# Patient Record
Sex: Male | Born: 1940 | Race: White | Hispanic: No | Marital: Married | Smoking: Former smoker
Health system: Southern US, Community
[De-identification: ages and names within clinical notes are randomized; demographics above are authoritative.]

## PROBLEM LIST (undated history)

## (undated) DIAGNOSIS — R130 Aphagia: Secondary | ICD-10-CM

## (undated) DIAGNOSIS — I639 Cerebral infarction, unspecified: Secondary | ICD-10-CM

## (undated) DIAGNOSIS — N390 Urinary tract infection, site not specified: Secondary | ICD-10-CM

## (undated) DIAGNOSIS — E785 Hyperlipidemia, unspecified: Secondary | ICD-10-CM

## (undated) DIAGNOSIS — Z87442 Personal history of urinary calculi: Secondary | ICD-10-CM

## (undated) DIAGNOSIS — J189 Pneumonia, unspecified organism: Secondary | ICD-10-CM

## (undated) DIAGNOSIS — J42 Unspecified chronic bronchitis: Secondary | ICD-10-CM

## (undated) DIAGNOSIS — M1990 Unspecified osteoarthritis, unspecified site: Secondary | ICD-10-CM

## (undated) DIAGNOSIS — I10 Essential (primary) hypertension: Secondary | ICD-10-CM

## (undated) DIAGNOSIS — F32A Depression, unspecified: Secondary | ICD-10-CM

## (undated) DIAGNOSIS — F0390 Unspecified dementia without behavioral disturbance: Secondary | ICD-10-CM

## (undated) DIAGNOSIS — F329 Major depressive disorder, single episode, unspecified: Secondary | ICD-10-CM

## (undated) DIAGNOSIS — H409 Unspecified glaucoma: Secondary | ICD-10-CM

## (undated) DIAGNOSIS — H5440 Blindness, one eye, unspecified eye: Secondary | ICD-10-CM

## (undated) DIAGNOSIS — E119 Type 2 diabetes mellitus without complications: Secondary | ICD-10-CM

## (undated) DIAGNOSIS — K219 Gastro-esophageal reflux disease without esophagitis: Secondary | ICD-10-CM

## (undated) HISTORY — PX: COLONOSCOPY: SHX174

## (undated) HISTORY — PX: ABDOMINAL HERNIA REPAIR: SHX539

## (undated) HISTORY — PX: PATELLA FRACTURE SURGERY: SHX735

## (undated) HISTORY — PX: CATARACT EXTRACTION W/ INTRAOCULAR LENS  IMPLANT, BILATERAL: SHX1307

## (undated) HISTORY — PX: POSTERIOR CERVICAL FUSION/FORAMINOTOMY: SHX5038

## (undated) HISTORY — PX: ESOPHAGOGASTRODUODENOSCOPY (EGD) WITH ESOPHAGEAL DILATION: SHX5812

## (undated) HISTORY — PX: FRACTURE SURGERY: SHX138

## (undated) HISTORY — PX: HERNIA REPAIR: SHX51

---

## 1999-09-18 ENCOUNTER — Encounter: Payer: Self-pay | Admitting: Nephrology

## 1999-09-18 ENCOUNTER — Encounter: Admission: RE | Admit: 1999-09-18 | Discharge: 1999-09-18 | Payer: Self-pay | Admitting: Nephrology

## 2002-06-13 ENCOUNTER — Encounter: Admission: RE | Admit: 2002-06-13 | Discharge: 2002-09-11 | Payer: Self-pay | Admitting: Rheumatology

## 2004-10-30 ENCOUNTER — Emergency Department (HOSPITAL_COMMUNITY): Admission: EM | Admit: 2004-10-30 | Discharge: 2004-10-31 | Payer: Self-pay | Admitting: *Deleted

## 2004-12-19 ENCOUNTER — Encounter: Admission: RE | Admit: 2004-12-19 | Discharge: 2005-03-19 | Payer: Self-pay | Admitting: Rheumatology

## 2008-10-16 ENCOUNTER — Inpatient Hospital Stay (HOSPITAL_COMMUNITY): Admission: EM | Admit: 2008-10-16 | Discharge: 2008-10-16 | Payer: Self-pay | Admitting: Emergency Medicine

## 2008-10-16 ENCOUNTER — Ambulatory Visit: Payer: Self-pay | Admitting: Cardiology

## 2011-01-06 LAB — COMPREHENSIVE METABOLIC PANEL
Albumin: 3.5 g/dL (ref 3.5–5.2)
Alkaline Phosphatase: 53 U/L (ref 39–117)
BUN: 14 mg/dL (ref 6–23)
Calcium: 9 mg/dL (ref 8.4–10.5)
Potassium: 3.8 mEq/L (ref 3.5–5.1)
Sodium: 142 mEq/L (ref 135–145)
Total Protein: 6.2 g/dL (ref 6.0–8.3)

## 2011-01-06 LAB — POCT CARDIAC MARKERS: Troponin i, poc: 0.17 ng/mL — ABNORMAL HIGH (ref 0.00–0.09)

## 2011-01-06 LAB — CBC
HCT: 44.1 % (ref 39.0–52.0)
Hemoglobin: 14.3 g/dL (ref 13.0–17.0)
MCV: 88.7 fL (ref 78.0–100.0)
Platelets: 206 10*3/uL (ref 150–400)
RBC: 4.82 MIL/uL (ref 4.22–5.81)
RBC: 4.97 MIL/uL (ref 4.22–5.81)
WBC: 5.2 10*3/uL (ref 4.0–10.5)
WBC: 6 10*3/uL (ref 4.0–10.5)

## 2011-01-06 LAB — LIPID PANEL
HDL: 34 mg/dL — ABNORMAL LOW (ref >39)
LDL Cholesterol: 65 mg/dL (ref 0–99)
Total CHOL/HDL Ratio: 3.4 RATIO
VLDL: 17 mg/dL (ref 0–40)

## 2011-01-06 LAB — GLUCOSE, CAPILLARY
Glucose-Capillary: 159 mg/dL — ABNORMAL HIGH (ref 70–99)
Glucose-Capillary: 218 mg/dL — ABNORMAL HIGH (ref 70–99)

## 2011-01-06 LAB — DIFFERENTIAL
Eosinophils Relative: 7 % — ABNORMAL HIGH (ref 0–5)
Lymphocytes Relative: 29 % (ref 12–46)
Lymphs Abs: 1.8 10*3/uL (ref 0.7–4.0)
Monocytes Absolute: 0.6 10*3/uL (ref 0.1–1.0)

## 2011-01-06 LAB — CK TOTAL AND CKMB (NOT AT ARMC)
CK, MB: 7.7 ng/mL — ABNORMAL HIGH (ref 0.3–4.0)
Relative Index: 4.3 — ABNORMAL HIGH (ref 0.0–2.5)
Total CK: 180 U/L (ref 7–232)

## 2011-01-06 LAB — POCT I-STAT, CHEM 8
BUN: 21 mg/dL (ref 6–23)
Calcium, Ion: 1.13 mmol/L (ref 1.12–1.32)
Chloride: 101 mEq/L (ref 96–112)
Creatinine, Ser: 0.9 mg/dL (ref 0.4–1.5)
Glucose, Bld: 185 mg/dL — ABNORMAL HIGH (ref 70–99)

## 2011-01-06 LAB — HEPARIN LEVEL (UNFRACTIONATED): Heparin Unfractionated: 0.37 IU/mL (ref 0.30–0.70)

## 2011-01-06 LAB — PROTIME-INR
INR: 0.9 (ref 0.00–1.49)
Prothrombin Time: 11.9 seconds (ref 11.6–15.2)

## 2011-01-06 LAB — CARDIAC PANEL(CRET KIN+CKTOT+MB+TROPI)
CK, MB: 4.9 ng/mL — ABNORMAL HIGH (ref 0.3–4.0)
Relative Index: 4.4 — ABNORMAL HIGH (ref 0.0–2.5)
Total CK: 117 U/L (ref 7–232)
Troponin I: <0.01 ng/mL (ref 0.00–0.06)

## 2011-01-06 LAB — D-DIMER, QUANTITATIVE: D-Dimer, Quant: 0.26 ug/mL-FEU (ref 0.00–0.48)

## 2011-01-06 LAB — TROPONIN I: Troponin I: <0.01 ng/mL (ref 0.00–0.06)

## 2011-02-04 NOTE — Consult Note (Signed)
Daniel Morrow, Daniel Morrow NO.:  [id number]   MEDICAL RECORD NO.:  [id number]          PATIENT TYPE:  INP   LOCATION:  2011                         FACILITY:  [facility]   PHYSICIAN:  Corky Crafts, MDDATE OF BIRTH:  02-27-1941   DATE OF CONSULTATION:  10/16/2008  DATE OF DISCHARGE:  10/16/2008                                 CONSULTATION   REFERRING PHYSICIAN:  Demetria Pore. Levitin, MD   REASON FOR CONSULTATION:  Shortness of breath, hypertension, high  cholesterol, and diabetes.   HISTORY OF PRESENT ILLNESS:  The patient is a 70 year old man who was  feeling well up until yesterday.  He is usually quite active.  He walks  3 times a week.  He actually had to hold a horse yesterday and this was  somewhat stressful.  He sat down on the incliner yesterday evening and  started to feel flushed.  His face appeared red according to his wife.  He got very short of breath and felt like he was being smothered.  The  sensation lasted about 30-45 minutes and resolved spontaneously.  He did  not have any chest pain.  He denies any palpitations or lightheadedness.  Since that time, he has not had any further repeat of symptoms.  He was  admitted to hospital and started on nitroglycerin and heparin.  He had  one point-of-care troponin which was slightly abnormal, but another  point-of-care which was normal, his troponins have all been in the  normal range.   PAST MEDICAL HISTORY:  1. Hypertension.  2. Diabetes.  3. High cholesterol.   PAST SURGICAL HISTORY:  Diskectomy, hernia, left knee surgery, and right  ankle surgery.   ALLERGIES:  No known drug allergies.   MEDICATIONS:  1. Lipitor 40 mg daily.  2. Zetia 10 mg daily.  3. Aspirin 81 mg daily.  4. Metformin 1 g b.i.d.  5. Glimepiride 2 mg b.i.d.  6. Hydrochlorothiazide 25 mg a day.  7. Benazepril 10 mg a day.   SOCIAL HISTORY:  He does not smoke, he quit 30 years ago.  He does not  drink alcohol.  He does drink  coffee.  He works as an Advertising account planner.   FAMILY HISTORY:  He had an uncle who had an MI.   REVIEW OF SYSTEMS:  He has had cold recently and cough.  He has had  seasonal allergies.  No nausea or vomiting.  No syncope.  Other symptoms  as described above.  All other systems are negative.   PHYSICAL EXAMINATION:  VITAL SIGNS:  Blood pressure 114/80 and pulse 70.  GENERAL:  He is awake, alert, in no distress.  HEAD:  Normocephalic and atraumatic.  EYES:  Extraocular movements intact.  NECK:  No JVD, no carotid bruits.  CARDIOVASCULAR:  Regular rate and rhythm.  S1 and S2.  LUNGS:  Clear to auscultation bilaterally.  ABDOMEN:  Soft, nontender, nondistended.  EXTREMITIES:  No edema.  Radial pulses 2+ radial pulses bilaterally.  NEUROLOGIC:  No focal deficits.  SKIN:  No rash.  BACK:  No kyphosis.   LABORATORY DATA:  Creatinine  0.8.  Troponins other than one point-of-  care have all been negative.  Hemoglobin 13.9.  Chest x-ray shows no  active disease.  ECG shows normal sinus rhythm.  No significant acute ST-  T wave changes diagnostic of ischemia.  There were possible septal Q-  waves.   ASSESSMENT AND PLAN:  1. Cardiac.  Unclear what caused the symptoms if his third set of      troponin is negative.  I think it will be okay to discharge the      patient and schedule an outpatient stress test later this week.      Certainly, if he had more symptoms or had an abnormal troponin on      his next test, I would likely plan for heart catheterization.  2. Increased cholesterol.  His LDL is less than 70, currently by the      blood test done in the hospital.  His HDL is somewhat low at less      than 40.  Continue Lipitor and Zetia.  3. Hypertension, uncontrolled.  4. Diabetes.  Continue aggressive control.      Corky Crafts, MD  Electronically Signed     JSV/MEDQ  D:  10/16/2008  T:  10/17/2008  Job:  [id number]

## 2011-02-04 NOTE — H&P (Signed)
Daniel Morrow, Daniel Morrow              ACCOUNT NO.:  [id number]   MEDICAL RECORD NO.:  [id number]          PATIENT TYPE:  INP   LOCATION:  2011                         FACILITY:  [facility]   PHYSICIAN:  Darryl D. Prime, MD    DATE OF BIRTH:  May 05, 1941   DATE OF ADMISSION:  10/15/2008  DATE OF DISCHARGE:                              HISTORY & PHYSICAL   PRIMARY CARE PHYSICIAN:  Lennox Pippins, M.D.   CODE STATUS:  Full code.   CHIEF COMPLAINT:  Smothering.   HISTORY OF PRESENT ILLNESS:  Daniel Morrow is a 70 year old male with a  history of diabetes for at least 10 years, history of hypertension,  history of hyperlipidemia, who notes new onset shortness of breath, and  severe smothering sensation at around 9:00 p.m. at rest while he was  getting ready to watch the football game. This lasted about 30 minutes.  It was not associated with exertion. He had no chest pain per say. No  nausea and vomiting. He did have sweats with the symptoms and flushing  noted by the family. No fever or cough noted. The patient has had a  recent sinus cold for the last 2 weeks and has been taking over-the-  counter decongestants for it. In the emergency room, troponin's were  mildly positive. Follow up was negative. He was started on heparin bolus  and then a drip as the symptoms were concerning and he did have T-wave  inversions in V1 and V2, were unsure if these changes were new. He was  also started on nitroglycerin drip and aspirin was given.   PAST MEDICAL/SURGICAL HISTORY:  1. History of diabetes for at least 10 years.  2. Former tobacco abuse.  3. History of hypertension.  4. History of hyperlipidemia.  5. He has had esophageal dilatation possibly for stricture on 3      occasions.  6. History of hernia repair x2.  7. He has had knee surgery on the right.  8. Neck surgery.   ALLERGIES:  NO KNOWN DRUG ALLERGIES.   MEDICATIONS:  1. Aspirin 81 mg daily.  2. Benazepril 10 mg daily.  3. HCTZ 25 mg  daily.  4. Glimepiride 2 mg in the morning and 4 mg in the p.m.  5. Lipitor 40 mg daily.  6. Metoprolol 40 mg daily.  7. Zetia 10 mg daily.   SOCIAL HISTORY:  He discontinued tobacco products 30 years ago. He  smoked for 20 years prior to that, 1 pack per day. No alcohol. He works  for [employer].   FAMILY HISTORY:  Negative for premature coronary artery disease. Father  had diabetes.   REVIEW OF SYSTEMS:  A 14 point review of systems negative unless stated  above.   PHYSICAL EXAMINATION:  VITAL SIGNS:  Temperature 98.8, blood pressure  152/80, pulse 85, respiratory rate 14, saturations 96% on room air.  GENERAL:  The patient is a male that looks younger than his stated age,  sitting upright in bed. In no acute distress.  HEENT:  Normocephalic and atraumatic. Pupils are equal, round, and  reactive to light.  Extraocular muscles intact. Oropharynx is dry.  NECK:  Supple with no lymphadenopathy or thyromegaly. No carotid bruits.  No jugular venous distention.  LUNGS:  Clear to auscultation bilaterally.  CARDIOVASCULAR:  Regular rhythm and rate. No murmur, rub, or gallop.  Normal S1 and S2. No S3 or S4.  ABDOMEN:  Soft, nontender, and nondistended with no hepatosplenomegaly.  EXTREMITIES:  No clubbing, cyanosis, or edema.  NEUROLOGIC:  Alert and oriented times four. Cranial nerves 2-12 are  grossly intact. Strength and sensation are grossly intact.   LABORATORY DATA:  White count of 6 with hemoglobin of 14.3 and  hematocrit 44.1. Platelets 218,000. segs 54%. Sodium is 139 with  potassium of 3.8. Chloride 101, bicarb 27, BUN 21, creatinine 0.9,  glucose 185. Cardiac markers point of care is around 2300 and showed a  troponin of 0.17, MB of 3.5. Blood draw shows the cardiac markers at  that time with MB of 7.7, CK 180, index was 4.2%, and troponin less than  0.01.   DIAGNOSTIC STUDIES:  Chest x-ray showed left sided atelectasis,  otherwise negative.   EKG showed  sinus rhythm with rate of 72 beats per minute, PR interval  168, QRS 100, QT corrected at 455, and T-wave inversions V1 and V2. He  has slightly poor anterior forces.   ASSESSMENT/PLAN:  This is a patient with a history of multiple risk  factors for coronary artery disease who now presents with possible  angina. Other possibilities include pulmonary embolus, DVT syndrome.  He  will be admitted for unstable angina. Will be given aspirin and beta  blocker and hold his oral hypoglycemics. He may need an ischemic  evaluation including cardiac catheterization. Will hold his Enalapril as  well for this reason but he will need to be discharged on this. Will  increase his Statins and Lipitor 80 mg daily. He will be on telemetry  and will get serial EKG's as needed. He will get cardiac markers. Check  a magnesium, TSH, and __________. It is suggested that he have an  evaluation of his ejection fraction. He will be NPO. Will get a  cardiology consult in the morning. For his diabetes, sliding scale  insulin for now.  Will rule out PE/DVT syndrome, will check a D-dimer.      Darryl D. Prime, MD  Electronically Signed     DDP/MEDQ  D:  10/16/2008  T:  10/16/2008  Job:  [id number]   cc:   Demetria Pore. Coral Spikes, M.D.

## 2011-06-02 ENCOUNTER — Emergency Department (HOSPITAL_COMMUNITY)
Admission: EM | Admit: 2011-06-02 | Discharge: 2011-06-02 | Disposition: A | Discharge status: Home / Self Care | Payer: Medicare Other | Attending: Emergency Medicine | Admitting: Emergency Medicine

## 2011-06-02 ENCOUNTER — Emergency Department (HOSPITAL_COMMUNITY): Payer: Medicare Other

## 2011-06-02 DIAGNOSIS — E119 Type 2 diabetes mellitus without complications: Secondary | ICD-10-CM | POA: Insufficient documentation

## 2011-06-02 DIAGNOSIS — Z79899 Other long term (current) drug therapy: Secondary | ICD-10-CM | POA: Insufficient documentation

## 2011-06-02 DIAGNOSIS — IMO0001 Reserved for inherently not codable concepts without codable children: Secondary | ICD-10-CM | POA: Insufficient documentation

## 2011-06-02 DIAGNOSIS — E785 Hyperlipidemia, unspecified: Secondary | ICD-10-CM | POA: Insufficient documentation

## 2011-06-02 DIAGNOSIS — R51 Headache: Secondary | ICD-10-CM | POA: Insufficient documentation

## 2011-06-02 DIAGNOSIS — N390 Urinary tract infection, site not specified: Secondary | ICD-10-CM | POA: Insufficient documentation

## 2011-06-02 DIAGNOSIS — R509 Fever, unspecified: Secondary | ICD-10-CM | POA: Insufficient documentation

## 2011-06-02 DIAGNOSIS — R1110 Vomiting, unspecified: Secondary | ICD-10-CM | POA: Insufficient documentation

## 2011-06-02 DIAGNOSIS — I10 Essential (primary) hypertension: Secondary | ICD-10-CM | POA: Insufficient documentation

## 2011-06-02 LAB — URINALYSIS, ROUTINE W REFLEX MICROSCOPIC
Glucose, UA: NEGATIVE mg/dL
Protein, ur: 100 mg/dL — AB
Specific Gravity, Urine: 1.023 (ref 1.005–1.030)
Urobilinogen, UA: 1 mg/dL (ref 0.0–1.0)

## 2011-06-02 LAB — CBC
MCH: 27.3 pg (ref 26.0–34.0)
MCHC: 33.7 g/dL (ref 30.0–36.0)
RDW: 14.1 % (ref 11.5–15.5)

## 2011-06-02 LAB — BASIC METABOLIC PANEL
Calcium: 8.9 mg/dL (ref 8.4–10.5)
GFR calc Af Amer: >60 mL/min (ref >60)
GFR calc non Af Amer: >60 mL/min (ref >60)
Glucose, Bld: 236 mg/dL — ABNORMAL HIGH (ref 70–99)
Potassium: 3.6 mEq/L (ref 3.5–5.1)
Sodium: 133 mEq/L — ABNORMAL LOW (ref 135–145)

## 2011-06-02 LAB — DIFFERENTIAL
Basophils Absolute: 0 10*3/uL (ref 0.0–0.1)
Basophils Relative: 0 % (ref 0–1)
Eosinophils Absolute: 0 10*3/uL (ref 0.0–0.7)
Eosinophils Relative: 0 % (ref 0–5)
Monocytes Absolute: 1.3 10*3/uL — ABNORMAL HIGH (ref 0.1–1.0)
Monocytes Relative: 12 % (ref 3–12)
Neutro Abs: 8.8 10*3/uL — ABNORMAL HIGH (ref 1.7–7.7)

## 2011-06-02 LAB — HEPATIC FUNCTION PANEL
AST: 13 U/L (ref 0–37)
Albumin: 3.3 g/dL — ABNORMAL LOW (ref 3.5–5.2)
Bilirubin, Direct: 0.1 mg/dL (ref 0.0–0.3)
Total Bilirubin: 0.7 mg/dL (ref 0.3–1.2)

## 2011-06-02 LAB — URINE MICROSCOPIC-ADD ON

## 2011-06-04 LAB — URINE CULTURE
Colony Count: >=100000
Culture  Setup Time: 201209100352

## 2011-06-30 ENCOUNTER — Other Ambulatory Visit: Payer: Self-pay | Admitting: Family Medicine

## 2011-06-30 ENCOUNTER — Ambulatory Visit
Admission: RE | Admit: 2011-06-30 | Discharge: 2011-06-30 | Disposition: A | Discharge status: Home / Self Care | Payer: Medicare Other | Source: Ambulatory Visit | Attending: Family Medicine | Admitting: Family Medicine

## 2011-06-30 DIAGNOSIS — N50811 Right testicular pain: Secondary | ICD-10-CM

## 2011-11-21 DIAGNOSIS — I639 Cerebral infarction, unspecified: Secondary | ICD-10-CM

## 2011-11-21 HISTORY — DX: Cerebral infarction, unspecified: I63.9

## 2012-04-02 ENCOUNTER — Observation Stay (HOSPITAL_COMMUNITY)
Admission: EM | Admit: 2012-04-02 | Discharge: 2012-04-05 | Disposition: A | Discharge status: Home / Self Care | Payer: Medicare Other | Attending: Internal Medicine | Admitting: Internal Medicine

## 2012-04-02 ENCOUNTER — Encounter (HOSPITAL_COMMUNITY): Payer: Self-pay | Admitting: Emergency Medicine

## 2012-04-02 DIAGNOSIS — G459 Transient cerebral ischemic attack, unspecified: Secondary | ICD-10-CM

## 2012-04-02 DIAGNOSIS — I10 Essential (primary) hypertension: Secondary | ICD-10-CM | POA: Insufficient documentation

## 2012-04-02 DIAGNOSIS — R4789 Other speech disturbances: Secondary | ICD-10-CM | POA: Insufficient documentation

## 2012-04-02 DIAGNOSIS — I6350 Cerebral infarction due to unspecified occlusion or stenosis of unspecified cerebral artery: Principal | ICD-10-CM | POA: Insufficient documentation

## 2012-04-02 DIAGNOSIS — R279 Unspecified lack of coordination: Secondary | ICD-10-CM | POA: Insufficient documentation

## 2012-04-02 DIAGNOSIS — E785 Hyperlipidemia, unspecified: Secondary | ICD-10-CM | POA: Diagnosis present

## 2012-04-02 DIAGNOSIS — R42 Dizziness and giddiness: Secondary | ICD-10-CM | POA: Insufficient documentation

## 2012-04-02 DIAGNOSIS — I63539 Cerebral infarction due to unspecified occlusion or stenosis of unspecified posterior cerebral artery: Secondary | ICD-10-CM | POA: Diagnosis present

## 2012-04-02 DIAGNOSIS — R262 Difficulty in walking, not elsewhere classified: Secondary | ICD-10-CM | POA: Insufficient documentation

## 2012-04-02 DIAGNOSIS — E119 Type 2 diabetes mellitus without complications: Secondary | ICD-10-CM | POA: Diagnosis present

## 2012-04-02 HISTORY — DX: Hyperlipidemia, unspecified: E78.5

## 2012-04-02 HISTORY — DX: Essential (primary) hypertension: I10

## 2012-04-02 LAB — CBC
HCT: 42.8 % (ref 39.0–52.0)
Hemoglobin: 14 g/dL (ref 13.0–17.0)
MCHC: 32.7 g/dL (ref 30.0–36.0)

## 2012-04-02 LAB — DIFFERENTIAL
Basophils Relative: 0 % (ref 0–1)
Monocytes Absolute: 0.7 10*3/uL (ref 0.1–1.0)
Monocytes Relative: 6 % (ref 3–12)
Neutro Abs: 9.1 10*3/uL — ABNORMAL HIGH (ref 1.7–7.7)

## 2012-04-02 LAB — APTT: aPTT: 27 seconds (ref 24–37)

## 2012-04-02 NOTE — ED Provider Notes (Signed)
History     CSN: [id number]  Arrival date & time 04/02/12  2156   First MD Initiated Contact with Patient 04/02/12 2304      Chief Complaint  Patient presents with  . Transient Ischemic Attack    (Consider location/radiation/quality/duration/timing/severity/associated sxs/prior treatment) HPI Comments: 71 year old male with history of diabetes, hypertension and hypercholesterolemia who presents by paramedics with a complaint of slurred speech, weakness. According to the family members who accompany the patient he had acute onset of difficulty walking, slurred speech that occurred approximately 3 hours prior to evaluation. He was found to be severely hypertensive at 220 systolic, diaphoretic, nauseated. The symptoms lasted approximately 30-45 minutes, the patient had spontaneous resolution of his symptoms in route with paramedics. On arrival the patient has no complaints or symptoms. They were acute in onset, persistent, resolve spontaneously and have not been associated with fevers, chills, diarrhea, dysuria, rashes, chest pain, palpitations, neck pain, headache or changes in vision. He has never had any symptoms like this in the past  The history is provided by the patient, the spouse, the EMS personnel and a relative.    Past Medical History  Diagnosis Date  . Hypertension   . Diabetes mellitus   . Hyperlipidemia     History reviewed. No pertinent past surgical history.  History reviewed. No pertinent family history.  History  Substance Use Topics  . Smoking status: Never Smoker   . Smokeless tobacco: Not on file  . Alcohol Use: No      Review of Systems  All other systems reviewed and are negative.    Allergies  Review of patient's allergies indicates no known allergies.  Home Medications   Current Outpatient Rx  Name Route Sig Dispense Refill  . ASPIRIN 81 MG PO TABS Oral Take 81 mg by mouth daily.    Marland Kitchen BENAZEPRIL HCL 10 MG PO TABS Oral Take 10 mg by mouth  daily.    Marland Kitchen CANAGLIFLOZIN 100 MG PO TABS Oral Take 1 tablet by mouth daily.    Marland Kitchen HYDROCHLOROTHIAZIDE 25 MG PO TABS Oral Take 25 mg by mouth daily.    . CENTRUM SILVER PO Oral Take 1 tablet by mouth daily.    Marland Kitchen PANTOPRAZOLE SODIUM 40 MG PO TBEC Oral Take 40 mg by mouth daily.    Marland Kitchen SIMVASTATIN 40 MG PO TABS Oral Take 40 mg by mouth every evening.    Marland Kitchen SITAGLIPTIN-METFORMIN HCL 50-1000 MG PO TABS Oral Take 1 tablet by mouth 2 (two) times daily with a meal.      BP 145/69  Pulse 76  Temp 98.1 F (36.7 C)  Resp 23  SpO2 96%  Physical Exam  Nursing note and vitals reviewed. Constitutional: He appears well-developed and well-nourished. No distress.  HENT:  Head: Normocephalic and atraumatic.  Mouth/Throat: Oropharynx is clear and moist. No oropharyngeal exudate.  Eyes: Conjunctivae and EOM are normal. Pupils are equal, round, and reactive to light. Right eye exhibits no discharge. Left eye exhibits no discharge. No scleral icterus.  Neck: Normal range of motion. Neck supple. No JVD present. No thyromegaly present.  Cardiovascular: Normal rate, regular rhythm, normal heart sounds and intact distal pulses.  Exam reveals no gallop and no friction rub.   No murmur heard. Pulmonary/Chest: Effort normal and breath sounds normal. No respiratory distress. He has no wheezes. He has no rales.  Abdominal: Soft. Bowel sounds are normal. He exhibits no distension and no mass. There is no tenderness.  Musculoskeletal: Normal range of motion.  He exhibits no edema and no tenderness.  Lymphadenopathy:    He has no cervical adenopathy.  Neurological: He is alert. Coordination normal.       Neurologic exam:  Speech clear, pupils equal round reactive to light, extraocular movements intact  Normal peripheral visual fields Cranial nerves III through XII normal including no facial droop Follows commands, moves all extremities x4, normal strength to bilateral upper and lower extremities at all major muscle  groups including grip Sensation normal to light touch and pinprick Coordination intact, no limb ataxia, finger-nose-finger normal Rapid alternating movements normal No pronator drift Gait normal   Skin: Skin is warm and dry. No rash noted. No erythema.  Psychiatric: He has a normal mood and affect. His behavior is normal.    ED Course  Procedures (including critical care time)  Labs Reviewed  CBC - Abnormal; Notable for the following:    WBC 11.0 (*)     All other components within normal limits  DIFFERENTIAL - Abnormal; Notable for the following:    Neutrophils Relative 83 (*)     Neutro Abs 9.1 (*)     Lymphocytes Relative 10 (*)     All other components within normal limits  COMPREHENSIVE METABOLIC PANEL - Abnormal; Notable for the following:    Glucose, Bld 218 (*)     BUN 25 (*)     GFR calc non Af Amer 66 (*)     GFR calc Af Amer 76 (*)     All other components within normal limits  CK TOTAL AND CKMB - Abnormal; Notable for the following:    CK, MB 4.2 (*)     Relative Index 3.3 (*)     All other components within normal limits  PROTIME-INR  APTT  TROPONIN I   Ct Head Wo Contrast  04/03/2012  *RADIOLOGY REPORT*  Clinical Data: Slurred speech, dizziness.  CT HEAD WITHOUT CONTRAST  Technique:  Contiguous axial images were obtained from the base of the skull through the vertex without contrast.  Comparison: None.  Findings: Prominence of the sulci, cisterns, and ventricles, in keeping with volume loss. There are subcortical and periventricular white matter hypodensities, a nonspecific finding most often seen with chronic microangiopathic changes.  There is no evidence for acute hemorrhage, overt hydrocephalus, mass lesion, or abnormal extra-axial fluid collection.  No definite CT evidence for acute cortical based (large artery) infarction. The visualized paranasal sinuses and mastoid air cells are predominately clear.  IMPRESSION: Mild white matter changes and volume loss.   No definite acute intracranial abnormality.  Original Report Authenticated By: Waneta Martins, M.D.     1. TIA (transient ischemic attack)   2. Hypertension       MDM  At this time his ABCD2 score for TIA is 6 putting him in a high risk category. We'll perform CT scan, EKG, laboratory workup though at this time the patient has no carotid bruits, normal neurologic exam and blood pressure which has improved significantly and is currently in the 160/85 range. CT scan pending at this time  ED ECG REPORT  I personally interpreted this EKG   Date: 04/03/2012   Rate: 72  Rhythm: normal sinus rhythm  QRS Axis: normal  Intervals: normal  ST/T Wave abnormalities: normal  Conduction Disutrbances:none  Narrative Interpretation: Poor R-wave progression  Old EKG Reviewed: Compared with 10/15/2008, no significant changes, QRS remains slightly prolonged, poor R-wave progression still present   Results show no significant abnormalities including troponin, blood counts,  electrolytes  cardiac enzymes. CT scan reviewed showing no signs of acute ischemia or hemorrhage, patient given results and is in agreement with admission to the hospital as his risk for seven-day stroke is almost 12% based on ABCD2 scores   Vida Roller, MD 04/03/12 [phone]

## 2012-04-02 NOTE — ED Notes (Signed)
Patient was carrying his granddaughter in; when patient got inside the building, patient started to become really dizzy.  Handed his granddaughter to his family member and was assisted to the couch.  Patient became really hot, diaphoretic, and nauseated.  Patient vomited x1; RN at nursing home (that he was visiting at), noticed that patient's eyes were very dilated.  Patient began to have slurred speech and possible expressive aphasia.  Last seen normal was 2045.  EMS was paged; upon EMS arrival, patient's symptoms resolved.  Denies weakness at this time.  Facial droop not present; speech clear.  Hang grips and foot pushes are bilaterally equal and strong.  NIH scale negative.  Patient alert and oriented x4; PERRL present.  Upon arrival to room, patient changed into gown and connected to continuous cardiac, pulse ox, and blood pressure monitor.

## 2012-04-02 NOTE — ED Notes (Signed)
Patient currently sitting up in bed; no respiratory or acute distress noted.  Patient updated on plan of care; informed patient that we are currently waiting on EDP to come and assess patient; patient has no other questions or concerns at this time; will continue to monitor.

## 2012-04-02 NOTE — ED Notes (Signed)
Per EMS, patient's wife report that patient felt dizzy around 8:30 pm, started to have slurred speech, started to have weakness (cannot specify whether one sided or both), and patient fell against the fall -- did not have a syncopal episode; denies hitting head or any injury.  Denies any deficits at this time; symmetrical smile.  No nausea/vomiting enroute; slurred speech resolved upon EMS arrival.  No history of TIA/CVA.  18 GA left AC IV started.  12-lead was unremarkable; stroke scale negative.

## 2012-04-03 ENCOUNTER — Encounter (HOSPITAL_COMMUNITY): Payer: Self-pay | Admitting: Internal Medicine

## 2012-04-03 ENCOUNTER — Observation Stay (HOSPITAL_COMMUNITY): Payer: Medicare Other

## 2012-04-03 ENCOUNTER — Emergency Department (HOSPITAL_COMMUNITY): Payer: Medicare Other

## 2012-04-03 DIAGNOSIS — G459 Transient cerebral ischemic attack, unspecified: Secondary | ICD-10-CM

## 2012-04-03 DIAGNOSIS — I10 Essential (primary) hypertension: Secondary | ICD-10-CM | POA: Diagnosis present

## 2012-04-03 DIAGNOSIS — E119 Type 2 diabetes mellitus without complications: Secondary | ICD-10-CM | POA: Diagnosis present

## 2012-04-03 DIAGNOSIS — I63539 Cerebral infarction due to unspecified occlusion or stenosis of unspecified posterior cerebral artery: Secondary | ICD-10-CM | POA: Diagnosis present

## 2012-04-03 LAB — COMPREHENSIVE METABOLIC PANEL
Albumin: 4.1 g/dL (ref 3.5–5.2)
BUN: 25 mg/dL — ABNORMAL HIGH (ref 6–23)
CO2: 29 mEq/L (ref 19–32)
Chloride: 101 mEq/L (ref 96–112)
Creatinine, Ser: 1.11 mg/dL (ref 0.50–1.35)
GFR calc Af Amer: 76 mL/min — ABNORMAL LOW (ref >90)
GFR calc non Af Amer: 66 mL/min — ABNORMAL LOW (ref >90)
Total Bilirubin: 0.8 mg/dL (ref 0.3–1.2)

## 2012-04-03 LAB — CBC
HCT: 40.5 % (ref 39.0–52.0)
Hemoglobin: 13.3 g/dL (ref 13.0–17.0)
MCV: 81.7 fL (ref 78.0–100.0)
RBC: 4.96 MIL/uL (ref 4.22–5.81)
WBC: 7.2 10*3/uL (ref 4.0–10.5)

## 2012-04-03 LAB — BASIC METABOLIC PANEL
CO2: 28 mEq/L (ref 19–32)
Chloride: 103 mEq/L (ref 96–112)
Potassium: 3.6 mEq/L (ref 3.5–5.1)
Sodium: 141 mEq/L (ref 135–145)

## 2012-04-03 LAB — CK TOTAL AND CKMB (NOT AT ARMC)
CK, MB: 4.2 ng/mL — ABNORMAL HIGH (ref 0.3–4.0)
Total CK: 126 U/L (ref 7–232)

## 2012-04-03 LAB — RAPID URINE DRUG SCREEN, HOSP PERFORMED
Amphetamines: NOT DETECTED
Opiates: NOT DETECTED
Tetrahydrocannabinol: NOT DETECTED

## 2012-04-03 LAB — LIPID PANEL
LDL Cholesterol: 86 mg/dL (ref 0–99)
Triglycerides: 140 mg/dL (ref <150)

## 2012-04-03 MED ORDER — ENOXAPARIN SODIUM 40 MG/0.4ML ~~LOC~~ SOLN
40.0000 mg | SUBCUTANEOUS | Status: DC
  Administered 2012-04-03 – 2012-04-05 (×3): 40 mg via SUBCUTANEOUS
  Filled 2012-04-03 (×4): qty 0.4

## 2012-04-03 MED ORDER — ACETAMINOPHEN 650 MG RE SUPP: 650.0000 mg | Freq: Four times a day (QID) | RECTAL | Status: DC | PRN

## 2012-04-03 MED ORDER — ALUM & MAG HYDROXIDE-SIMETH 200-200-20 MG/5ML PO SUSP: 30.0000 mL | Freq: Four times a day (QID) | ORAL | Status: DC | PRN

## 2012-04-03 MED ORDER — LINAGLIPTIN 5 MG PO TABS
5.0000 mg | ORAL_TABLET | Freq: Every day | ORAL | Status: DC
  Administered 2012-04-03 – 2012-04-05 (×3): 5 mg via ORAL
  Filled 2012-04-03 (×3): qty 1

## 2012-04-03 MED ORDER — ONDANSETRON HCL 4 MG PO TABS: 4.0000 mg | ORAL_TABLET | Freq: Four times a day (QID) | ORAL | Status: DC | PRN

## 2012-04-03 MED ORDER — ONDANSETRON HCL 4 MG/2ML IJ SOLN: 4.0000 mg | Freq: Four times a day (QID) | INTRAMUSCULAR | Status: DC | PRN

## 2012-04-03 MED ORDER — HYDRALAZINE HCL 20 MG/ML IJ SOLN
10.0000 mg | Freq: Four times a day (QID) | INTRAMUSCULAR | Status: DC | PRN
  Filled 2012-04-03: qty 0.5

## 2012-04-03 MED ORDER — SODIUM CHLORIDE 0.9 % IV SOLN: 250.0000 mL | INTRAVENOUS | Status: DC | PRN

## 2012-04-03 MED ORDER — METFORMIN HCL 500 MG PO TABS
1000.0000 mg | ORAL_TABLET | Freq: Two times a day (BID) | ORAL | Status: DC
  Administered 2012-04-03 – 2012-04-04 (×2): 1000 mg via ORAL
  Filled 2012-04-03 (×7): qty 2

## 2012-04-03 MED ORDER — SODIUM CHLORIDE 0.9 % IJ SOLN: 3.0000 mL | INTRAMUSCULAR | Status: DC | PRN

## 2012-04-03 MED ORDER — SIMVASTATIN 20 MG PO TABS
20.0000 mg | ORAL_TABLET | Freq: Every day | ORAL | Status: DC
  Administered 2012-04-03: 20 mg via ORAL
  Filled 2012-04-03 (×2): qty 1

## 2012-04-03 MED ORDER — ASPIRIN 81 MG PO CHEW
324.0000 mg | CHEWABLE_TABLET | Freq: Once | ORAL | Status: AC
  Administered 2012-04-03: 324 mg via ORAL
  Filled 2012-04-03: qty 4

## 2012-04-03 MED ORDER — ENOXAPARIN SODIUM 40 MG/0.4ML ~~LOC~~ SOLN: 40.0000 mg | SUBCUTANEOUS | Status: DC

## 2012-04-03 MED ORDER — HYDROMORPHONE HCL PF 1 MG/ML IJ SOLN: 0.5000 mg | INTRAMUSCULAR | Status: DC | PRN

## 2012-04-03 MED ORDER — ZOLPIDEM TARTRATE 5 MG PO TABS: 5.0000 mg | ORAL_TABLET | Freq: Every evening | ORAL | Status: DC | PRN

## 2012-04-03 MED ORDER — BENAZEPRIL HCL 10 MG PO TABS
10.0000 mg | ORAL_TABLET | Freq: Every day | ORAL | Status: DC
  Administered 2012-04-03 – 2012-04-05 (×3): 10 mg via ORAL
  Filled 2012-04-03 (×3): qty 1

## 2012-04-03 MED ORDER — ONDANSETRON HCL 4 MG/2ML IJ SOLN: 4.0000 mg | Freq: Three times a day (TID) | INTRAMUSCULAR | Status: DC | PRN

## 2012-04-03 MED ORDER — OXYCODONE HCL 5 MG PO TABS
5.0000 mg | ORAL_TABLET | ORAL | Status: DC | PRN
  Filled 2012-04-03: qty 1

## 2012-04-03 MED ORDER — ACETAMINOPHEN 325 MG PO TABS
650.0000 mg | ORAL_TABLET | Freq: Four times a day (QID) | ORAL | Status: DC | PRN
  Administered 2012-04-05: 650 mg via ORAL
  Filled 2012-04-03: qty 2

## 2012-04-03 MED ORDER — SODIUM CHLORIDE 0.9 % IJ SOLN
3.0000 mL | Freq: Two times a day (BID) | INTRAMUSCULAR | Status: DC
  Administered 2012-04-03 – 2012-04-05 (×6): 3 mL via INTRAVENOUS

## 2012-04-03 NOTE — Progress Notes (Signed)
  Echocardiogram 2D Echocardiogram has been performed.  Xabi Wittler FRANCES 04/03/2012, 4:50 PM

## 2012-04-03 NOTE — ED Notes (Signed)
Ambulated patient without any difficulty.  Patient states that he is ambulating per normal
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
## 2012-04-03 NOTE — Progress Notes (Signed)
Subjective: Admission H&P has been reviewed, patient presented with complaint of slurred speech headache, and emergency room blood pressure was elevated. The patient CT negative, MRI positive for cerebellar infarct. Patient with no history of A. fib, carotid Doppler unremarkable. Patient's family was present, all results discussed with family in detail, all questions answered.  Objective: Vital signs in last 24 hours: Temp:  [97.9 F (36.6 C)-98.1 F (36.7 C)] 97.9 F (36.6 C) (07/13 1005) Pulse Rate:  [66-77] 77  (07/13 1005) Resp:  [18-23] 20  (07/13 1005) BP: (145-169)/(67-82) 145/82 mmHg (07/13 1005) SpO2:  [95 %-98 %] 95 % (07/13 1005) FiO2 (%):  [21 %] 21 % (07/13 0139) Weight:  [90 kg (198 lb 6.6 oz)-90.039 kg (198 lb 8 oz)] 90.039 kg (198 lb 8 oz) (07/13 0457) Weight change:     Intake/Output from previous day:   Intake/Output this shift: Total I/O In: 600 [P.O.:600] Out: -   General appearance: alert and cooperative Resp: clear to auscultation bilaterally Cardio: regular rate and rhythm, S1, S2 normal, no murmur, click, rub or gallop Extremities: extremities normal, atraumatic, no cyanosis or edema Neurologic: Motor: Cranial nerves 2-12 intact, motor 5 out of 5, no sensory deficits, patient did have some difficulty with tandem gait although minimal  Lab Results:  Results for orders placed during the hospital encounter of 04/02/12 (from the past 24 hour(s))  PROTIME-INR     Status: Normal   Collection Time   04/02/12 11:20 PM      Component Value Range   Prothrombin Time 12.2  11.6 - 15.2 seconds   INR 0.89  0.00 - 1.49  APTT     Status: Normal   Collection Time   04/02/12 11:20 PM      Component Value Range   aPTT 27  24 - 37 seconds  CBC     Status: Abnormal   Collection Time   04/02/12 11:20 PM      Component Value Range   WBC 11.0 (*) 4.0 - 10.5 K/uL   RBC 5.23  4.22 - 5.81 MIL/uL   Hemoglobin 14.0  13.0 - 17.0 g/dL   HCT 47.8  29.5 - 62.1 %   MCV 81.8   78.0 - 100.0 fL   MCH 26.8  26.0 - 34.0 pg   MCHC 32.7  30.0 - 36.0 g/dL   RDW 30.8  65.7 - 84.6 %   Platelets 203  150 - 400 K/uL  DIFFERENTIAL     Status: Abnormal   Collection Time   04/02/12 11:20 PM      Component Value Range   Neutrophils Relative 83 (*) 43 - 77 %   Neutro Abs 9.1 (*) 1.7 - 7.7 K/uL   Lymphocytes Relative 10 (*) 12 - 46 %   Lymphs Abs 1.1  0.7 - 4.0 K/uL   Monocytes Relative 6  3 - 12 %   Monocytes Absolute 0.7  0.1 - 1.0 K/uL   Eosinophils Relative 1  0 - 5 %   Eosinophils Absolute 0.1  0.0 - 0.7 K/uL   Basophils Relative 0  0 - 1 %   Basophils Absolute 0.0  0.0 - 0.1 K/uL  COMPREHENSIVE METABOLIC PANEL     Status: Abnormal   Collection Time   04/02/12 11:20 PM      Component Value Range   Sodium 142  135 - 145 mEq/L   Potassium 4.1  3.5 - 5.1 mEq/L   Chloride 101  96 - 112 mEq/L  CO2 29  19 - 32 mEq/L   Glucose, Bld 218 (*) 70 - 99 mg/dL   BUN 25 (*) 6 - 23 mg/dL   Creatinine, Ser 4.54  0.50 - 1.35 mg/dL   Calcium 9.4  8.4 - 09.8 mg/dL   Total Protein 7.4  6.0 - 8.3 g/dL   Albumin 4.1  3.5 - 5.2 g/dL   AST 16  0 - 37 U/L   ALT 16  0 - 53 U/L   Alkaline Phosphatase 72  39 - 117 U/L   Total Bilirubin 0.8  0.3 - 1.2 mg/dL   GFR calc non Af Amer 66 (*) >90 mL/min   GFR calc Af Amer 76 (*) >90 mL/min  CK TOTAL AND CKMB     Status: Abnormal   Collection Time   04/02/12 11:20 PM      Component Value Range   Total CK 126  7 - 232 U/L   CK, MB 4.2 (*) 0.3 - 4.0 ng/mL   Relative Index 3.3 (*) 0.0 - 2.5  TROPONIN I     Status: Normal   Collection Time   04/02/12 11:20 PM      Component Value Range   Troponin I <0.30  <0.30 ng/mL  BASIC METABOLIC PANEL     Status: Abnormal   Collection Time   04/03/12  5:30 AM      Component Value Range   Sodium 141  135 - 145 mEq/L   Potassium 3.6  3.5 - 5.1 mEq/L   Chloride 103  96 - 112 mEq/L   CO2 28  19 - 32 mEq/L   Glucose, Bld 167 (*) 70 - 99 mg/dL   BUN 22  6 - 23 mg/dL   Creatinine, Ser 1.19  0.50 - 1.35  mg/dL   Calcium 9.2  8.4 - 14.7 mg/dL   GFR calc non Af Amer 75 (*) >90 mL/min   GFR calc Af Amer 87 (*) >90 mL/min  CBC     Status: Normal   Collection Time   04/03/12  5:30 AM      Component Value Range   WBC 7.2  4.0 - 10.5 K/uL   RBC 4.96  4.22 - 5.81 MIL/uL   Hemoglobin 13.3  13.0 - 17.0 g/dL   HCT 82.9  56.2 - 13.0 %   MCV 81.7  78.0 - 100.0 fL   MCH 26.8  26.0 - 34.0 pg   MCHC 32.8  30.0 - 36.0 g/dL   RDW 86.5  78.4 - 69.6 %   Platelets 211  150 - 400 K/uL  LIPID PANEL     Status: Normal   Collection Time   04/03/12  6:00 AM      Component Value Range   Cholesterol 154  0 - 200 mg/dL   Triglycerides 295  <284 mg/dL   HDL 40  >13 mg/dL   Total CHOL/HDL Ratio 3.9     VLDL 28  0 - 40 mg/dL   LDL Cholesterol 86  0 - 99 mg/dL  URINE RAPID DRUG SCREEN (HOSP PERFORMED)     Status: Normal   Collection Time   04/03/12  6:59 AM      Component Value Range   Opiates NONE DETECTED  NONE DETECTED   Cocaine NONE DETECTED  NONE DETECTED   Benzodiazepines NONE DETECTED  NONE DETECTED   Amphetamines NONE DETECTED  NONE DETECTED   Tetrahydrocannabinol NONE DETECTED  NONE DETECTED   Barbiturates NONE DETECTED  NONE  DETECTED      Studies/Results: Ct Head Wo Contrast  04/03/2012  *RADIOLOGY REPORT*  Clinical Data: Slurred speech, dizziness.  CT HEAD WITHOUT CONTRAST  Technique:  Contiguous axial images were obtained from the base of the skull through the vertex without contrast.  Comparison: None.  Findings: Prominence of the sulci, cisterns, and ventricles, in keeping with volume loss. There are subcortical and periventricular white matter hypodensities, a nonspecific finding most often seen with chronic microangiopathic changes.  There is no evidence for acute hemorrhage, overt hydrocephalus, mass lesion, or abnormal extra-axial fluid collection.  No definite CT evidence for acute cortical based (large artery) infarction. The visualized paranasal sinuses and mastoid air cells are  predominately clear.  IMPRESSION: Mild white matter changes and volume loss.  No definite acute intracranial abnormality.  Original Report Authenticated By: Waneta Martins, M.D.   Mri Brain Without Contrast  04/03/2012  *RADIOLOGY REPORT*  Clinical Data:  Sudden onset of slurred speech and ataxia with difficulty walking.  MRI HEAD WITHOUT CONTRAST MRA HEAD WITHOUT CONTRAST  Technique:  Multiplanar, multiecho pulse sequences of the brain and surrounding structures were obtained without intravenous contrast. Angiographic images of the head were obtained using MRA technique without contrast.  Comparison:  CT head 04/02/2012  MRI HEAD  Findings:  Acute  right-sided infarcts affect the cerebellum inferolaterally, within the right middle cerebellar peduncle, and involve the the superior vermis.  There is no associated hemorrhage.  No supratentorial infarcts are seen.  There is atrophy with chronic microvascular ischemic change.  No midline shift.  Normal pituitary and cerebellar tonsils.  Negative orbits.  No acute sinus or mastoid disease.  Compared with prior CT, these infarcts are not visible.  IMPRESSION: Multiple acute right cerebellar infarcts without hemorrhage or mass effect.  MRA HEAD  Findings: The internal carotid arteries are widely patent.  The basilar artery is widely patent with the right vertebral dominant. Left vertebral primarily supplies the posterior inferior cerebellar artery.  There is no intracranial stenosis or aneurysm.  No right vertebral or  right cerebellar branch occlusion is seen.  IMPRESSION: Unremarkable MR angiography intracranial circulation.  Original Report Authenticated By: Elsie Stain, M.D.   Mr Mra Head/brain Wo Cm  04/03/2012  *RADIOLOGY REPORT*  Clinical Data:  Sudden onset of slurred speech and ataxia with difficulty walking.  MRI HEAD WITHOUT CONTRAST MRA HEAD WITHOUT CONTRAST  Technique:  Multiplanar, multiecho pulse sequences of the brain and surrounding structures  were obtained without intravenous contrast. Angiographic images of the head were obtained using MRA technique without contrast.  Comparison:  CT head 04/02/2012  MRI HEAD  Findings:  Acute  right-sided infarcts affect the cerebellum inferolaterally, within the right middle cerebellar peduncle, and involve the the superior vermis.  There is no associated hemorrhage.  No supratentorial infarcts are seen.  There is atrophy with chronic microvascular ischemic change.  No midline shift.  Normal pituitary and cerebellar tonsils.  Negative orbits.  No acute sinus or mastoid disease.  Compared with prior CT, these infarcts are not visible.  IMPRESSION: Multiple acute right cerebellar infarcts without hemorrhage or mass effect.  MRA HEAD  Findings: The internal carotid arteries are widely patent.  The basilar artery is widely patent with the right vertebral dominant. Left vertebral primarily supplies the posterior inferior cerebellar artery.  There is no intracranial stenosis or aneurysm.  No right vertebral or  right cerebellar branch occlusion is seen.  IMPRESSION: Unremarkable MR angiography intracranial circulation.  Original Report Authenticated By:  Elsie Stain, M.D.    Medications:  Prior to Admission:  Prescriptions prior to admission  Medication Sig Dispense Refill  . aspirin 81 MG tablet Take 81 mg by mouth daily.      . benazepril (LOTENSIN) 10 MG tablet Take 10 mg by mouth daily.      . Canagliflozin (INVOKANA) 100 MG TABS Take 1 tablet by mouth daily.      . hydrochlorothiazide (HYDRODIURIL) 25 MG tablet Take 25 mg by mouth daily.      . Multiple Vitamins-Minerals (CENTRUM SILVER PO) Take 1 tablet by mouth daily.      . pantoprazole (PROTONIX) 40 MG tablet Take 40 mg by mouth daily.      . simvastatin (ZOCOR) 40 MG tablet Take 40 mg by mouth every evening.      . sitaGLIPtan-metformin (JANUMET) 50-1000 MG per tablet Take 1 tablet by mouth 2 (two) times daily with a meal.       Scheduled:   .  aspirin  324 mg Oral Once  . enoxaparin (LOVENOX) injection  40 mg Subcutaneous Q24H  . sodium chloride  3 mL Intravenous Q12H  . DISCONTD: enoxaparin (LOVENOX) injection  40 mg Subcutaneous Q24H   Continuous:   Assessment/Plan: CVA, MRI results as above. Followup additional studies, neuro will be consulted Hypercholesterolemia, check lipid panel, Diabetes, check A1c, continue current meds Hypertension elevated on presentation to the ER, currently better controlled for now continue current meds and  LOS: 1 day   Ragna Kramlich D 04/03/2012, 12:39 PM

## 2012-04-03 NOTE — H&P (Signed)
DATE OF ADMISSION:  04/03/2012  PCP:  Darnelle Bos, MD   Chief Complaint:  Slurred speech and Difficulty walking   HPI: Daniel Morrow is an 71 y.o. male who began to have sudden onset of slurred speech and ataxia and difficulty walking in the afternoon while he and his wife were at a nursing home visiting her father.  His symptoms were associated with nausea and diaphoresis.  The symptoms lasted for 35 minutes and he was brought to the ED by EMS for further evaluation.  He denied having any chest pain, or headache, or syncope associated with the event.  He denies any previous similar symptoms.  When EMS arrived his blood pressure was found to be elevated at 220 systolic but his blood pressure also improved.    Past Medical History  Diagnosis Date  . Hypertension   . Diabetes mellitus   . Hyperlipidemia     History reviewed. No pertinent past surgical history.  Medications:  HOME MEDS: Prior to Admission medications   Medication Sig Start Date End Date Taking? Authorizing Provider  aspirin 81 MG tablet Take 81 mg by mouth daily.   Yes Historical Provider, MD  benazepril (LOTENSIN) 10 MG tablet Take 10 mg by mouth daily.   Yes Historical Provider, MD  Canagliflozin (INVOKANA) 100 MG TABS Take 1 tablet by mouth daily.   Yes Historical Provider, MD  hydrochlorothiazide (HYDRODIURIL) 25 MG tablet Take 25 mg by mouth daily.   Yes Historical Provider, MD  Multiple Vitamins-Minerals (CENTRUM SILVER PO) Take 1 tablet by mouth daily.   Yes Historical Provider, MD  pantoprazole (PROTONIX) 40 MG tablet Take 40 mg by mouth daily.   Yes Historical Provider, MD  simvastatin (ZOCOR) 40 MG tablet Take 40 mg by mouth every evening.   Yes Historical Provider, MD  sitaGLIPtan-metformin (JANUMET) 50-1000 MG per tablet Take 1 tablet by mouth 2 (two) times daily with a meal.   Yes Historical Provider, MD    Allergies:  No Known Allergies  Social History:   reports that he has never  smoked. He does not have any smokeless tobacco history on file. He reports that he does not drink alcohol or use illicit drugs.  Family History: History reviewed. No pertinent family history.  Review of Systems:  The patient denies anorexia, fever, weight loss, vision loss, decreased hearing, hoarseness, chest pain, syncope, dyspnea on exertion, peripheral edema, hemoptysis, abdominal pain, melena, hematochezia, severe indigestion/heartburn, hematuria, incontinence, genital sores, muscle weakness, suspicious skin lesions, transient blindness, depression, unusual weight change, abnormal bleeding, enlarged lymph nodes, angioedema, and breast masses.   Physical Exam:  GEN:  Pleasant examined  and in no acute distress; cooperative with exam Filed Vitals:   04/02/12 2215 04/02/12 2230 04/02/12 2300 04/03/12 0002  BP:  145/69 157/67   Pulse:  76 77   Temp: 97.9 F (36.6 C)   98.1 F (36.7 C)  Resp:  23 20   SpO2:  96% 96%    Blood pressure 157/67, pulse 77, temperature 98.1 F (36.7 C), resp. rate 20, SpO2 96.00%. PSYCH: SHe is alert and oriented x4; does not appear anxious does not appear depressed; affect is normal HEENT: Normocephalic and Atraumatic, Mucous membranes pink; PERRLA; EOM intact; Fundi:  Benign;  No scleral icterus, Nares: Patent, Oropharynx: Clear, Edentulous or Fair Dentition, Neck:  FROM, no cervical lymphadenopathy nor thyromegaly or carotid bruit; no JVD; Breasts:: Not examined CHEST WALL: No tenderness CHEST: Normal respiration, clear to auscultation bilaterally HEART: Regular rate and  rhythm; no murmurs rubs or gallops BACK: No kyphosis or scoliosis; no CVA tenderness ABDOMEN: Positive Bowel Sounds, Scaphoid, Obese, soft non-tender; no masses, no organomegaly, no pannus; no intertriginous candida. Rectal Exam: Not done EXTREMITIES: No bone or joint deformity; age-appropriate arthropathy of the hands and knees; no cyanosis, clubbing or edema; no  ulcerations. Genitalia: not examined PULSES: 2+ and symmetric SKIN: Normal hydration no rash or ulceration CNS: Cranial nerves 2-12 grossly intact no focal neurologic deficit   Labs & Imaging Results for orders placed during the hospital encounter of 04/02/12 (from the past 48 hour(s))  PROTIME-INR     Status: Normal   Collection Time   04/02/12 11:20 PM      Component Value Range Comment   Prothrombin Time 12.2  11.6 - 15.2 seconds    INR 0.89  0.00 - 1.49   APTT     Status: Normal   Collection Time   04/02/12 11:20 PM      Component Value Range Comment   aPTT 27  24 - 37 seconds   CBC     Status: Abnormal   Collection Time   04/02/12 11:20 PM      Component Value Range Comment   WBC 11.0 (*) 4.0 - 10.5 K/uL    RBC 5.23  4.22 - 5.81 MIL/uL    Hemoglobin 14.0  13.0 - 17.0 g/dL    HCT 16.1  09.6 - 04.5 %    MCV 81.8  78.0 - 100.0 fL    MCH 26.8  26.0 - 34.0 pg    MCHC 32.7  30.0 - 36.0 g/dL    RDW 40.9  81.1 - 91.4 %    Platelets 203  150 - 400 K/uL   DIFFERENTIAL     Status: Abnormal   Collection Time   04/02/12 11:20 PM      Component Value Range Comment   Neutrophils Relative 83 (*) 43 - 77 %    Neutro Abs 9.1 (*) 1.7 - 7.7 K/uL    Lymphocytes Relative 10 (*) 12 - 46 %    Lymphs Abs 1.1  0.7 - 4.0 K/uL    Monocytes Relative 6  3 - 12 %    Monocytes Absolute 0.7  0.1 - 1.0 K/uL    Eosinophils Relative 1  0 - 5 %    Eosinophils Absolute 0.1  0.0 - 0.7 K/uL    Basophils Relative 0  0 - 1 %    Basophils Absolute 0.0  0.0 - 0.1 K/uL   COMPREHENSIVE METABOLIC PANEL     Status: Abnormal   Collection Time   04/02/12 11:20 PM      Component Value Range Comment   Sodium 142  135 - 145 mEq/L    Potassium 4.1  3.5 - 5.1 mEq/L    Chloride 101  96 - 112 mEq/L    CO2 29  19 - 32 mEq/L    Glucose, Bld 218 (*) 70 - 99 mg/dL    BUN 25 (*) 6 - 23 mg/dL    Creatinine, Ser 7.82  0.50 - 1.35 mg/dL    Calcium 9.4  8.4 - 95.6 mg/dL    Total Protein 7.4  6.0 - 8.3 g/dL    Albumin 4.1   3.5 - 5.2 g/dL    AST 16  0 - 37 U/L    ALT 16  0 - 53 U/L    Alkaline Phosphatase 72  39 - 117 U/L    Total  Bilirubin 0.8  0.3 - 1.2 mg/dL    GFR calc non Af Amer 66 (*) >90 mL/min    GFR calc Af Amer 76 (*) >90 mL/min   CK TOTAL AND CKMB     Status: Abnormal   Collection Time   04/02/12 11:20 PM      Component Value Range Comment   Total CK 126  7 - 232 U/L    CK, MB 4.2 (*) 0.3 - 4.0 ng/mL    Relative Index 3.3 (*) 0.0 - 2.5   TROPONIN I     Status: Normal   Collection Time   04/02/12 11:20 PM      Component Value Range Comment   Troponin I <0.30  <0.30 ng/mL    Ct Head Wo Contrast  04/03/2012  *RADIOLOGY REPORT*  Clinical Data: Slurred speech, dizziness.  CT HEAD WITHOUT CONTRAST  Technique:  Contiguous axial images were obtained from the base of the skull through the vertex without contrast.  Comparison: None.  Findings: Prominence of the sulci, cisterns, and ventricles, in keeping with volume loss. There are subcortical and periventricular white matter hypodensities, a nonspecific finding most often seen with chronic microangiopathic changes.  There is no evidence for acute hemorrhage, overt hydrocephalus, mass lesion, or abnormal extra-axial fluid collection.  No definite CT evidence for acute cortical based (large artery) infarction. The visualized paranasal sinuses and mastoid air cells are predominately clear.  IMPRESSION: Mild white matter changes and volume loss.  No definite acute intracranial abnormality.  Original Report Authenticated By: Waneta Martins, M.D.    EKG:   Normal Sinus Rhythm, No Acute St changes   Assessment: Present on Admission:  .TIA (transient ischemic attack) .Hypertension .Diabetes mellitus .Hyperlipidemia   Plan:    Admit for 23 hour Observation Status for TIA workup     The differential does include hypoglycemia MRI/MRA of Brain and Carotid US ordered Neuro checks Check Fasting Lipids. SSI coverage Reconcile Home Medications DVT  prophylaxis.   Other plans as per orders.    CODE STATUS:      FULL CODE        Ulrich Soules C 04/03/2012, 3:16 AM

## 2012-04-03 NOTE — Progress Notes (Addendum)
*  PRELIMINARY RESULTS* Vascular Ultrasound Carotid Duplex (Doppler) has been completed. No evidence of internal carotid artery stenosis bilaterally. Bilateral antegrade vertebral artery flow.  04/03/2012 10:05 AM Gertie Fey, RDMS, RDCS

## 2012-04-04 ENCOUNTER — Observation Stay (HOSPITAL_COMMUNITY): Payer: Medicare Other

## 2012-04-04 DIAGNOSIS — G459 Transient cerebral ischemic attack, unspecified: Secondary | ICD-10-CM

## 2012-04-04 MED ORDER — ASPIRIN-DIPYRIDAMOLE ER 25-200 MG PO CP12
1.0000 | ORAL_CAPSULE | Freq: Two times a day (BID) | ORAL | Status: DC
  Administered 2012-04-04 (×2): 1 via ORAL
  Filled 2012-04-04 (×4): qty 1

## 2012-04-04 MED ORDER — IOHEXOL 350 MG/ML SOLN
50.0000 mL | Freq: Once | INTRAVENOUS | Status: AC | PRN
  Administered 2012-04-04: 50 mL via INTRAVENOUS

## 2012-04-04 MED ORDER — ATORVASTATIN CALCIUM 40 MG PO TABS
40.0000 mg | ORAL_TABLET | Freq: Every day | ORAL | Status: DC
  Administered 2012-04-04: 40 mg via ORAL
  Filled 2012-04-04 (×2): qty 1

## 2012-04-04 NOTE — Progress Notes (Signed)
Subjective: Patient doing well, no new neural deficits overnight. He has been seen by neurology, Aggrenox has been added, additional studies ordered. Current echo is pending.  Objective: Vital signs in last 24 hours: Temp:  [97.7 F (36.5 C)-97.9 F (36.6 C)] 97.8 F (36.6 C) (07/14 0940) Pulse Rate:  [65-78] 76  (07/14 0940) Resp:  [18-20] 18  (07/14 0940) BP: (129-150)/(60-77) 150/67 mmHg (07/14 0940) SpO2:  [94 %-97 %] 96 % (07/14 0940) Weight change:     Intake/Output from previous day: 07/13 0701 - 07/14 0700 In: 840 [P.O.:840] Out: -  Intake/Output this shift:    General appearance: alert and cooperative Resp: clear to auscultation bilaterally Cardio: regular rate and rhythm, S1, S2 normal, no murmur, click, rub or gallop Neurologic: Motor: No motor deficit, exam unchanged from yesterday. Only had trouble with tandem gait  Lab Results:  No results found for this or any previous visit (from the past 24 hour(s)).    Studies/Results: Ct Head Wo Contrast  04/03/2012  *RADIOLOGY REPORT*  Clinical Data: Slurred speech, dizziness.  CT HEAD WITHOUT CONTRAST  Technique:  Contiguous axial images were obtained from the base of the skull through the vertex without contrast.  Comparison: None.  Findings: Prominence of the sulci, cisterns, and ventricles, in keeping with volume loss. There are subcortical and periventricular white matter hypodensities, a nonspecific finding most often seen with chronic microangiopathic changes.  There is no evidence for acute hemorrhage, overt hydrocephalus, mass lesion, or abnormal extra-axial fluid collection.  No definite CT evidence for acute cortical based (large artery) infarction. The visualized paranasal sinuses and mastoid air cells are predominately clear.  IMPRESSION: Mild white matter changes and volume loss.  No definite acute intracranial abnormality.  Original Report Authenticated By: Waneta Martins, M.D.   Mri Brain Without  Contrast  04/03/2012  *RADIOLOGY REPORT*  Clinical Data:  Sudden onset of slurred speech and ataxia with difficulty walking.  MRI HEAD WITHOUT CONTRAST MRA HEAD WITHOUT CONTRAST  Technique:  Multiplanar, multiecho pulse sequences of the brain and surrounding structures were obtained without intravenous contrast. Angiographic images of the head were obtained using MRA technique without contrast.  Comparison:  CT head 04/02/2012  MRI HEAD  Findings:  Acute  right-sided infarcts affect the cerebellum inferolaterally, within the right middle cerebellar peduncle, and involve the the superior vermis.  There is no associated hemorrhage.  No supratentorial infarcts are seen.  There is atrophy with chronic microvascular ischemic change.  No midline shift.  Normal pituitary and cerebellar tonsils.  Negative orbits.  No acute sinus or mastoid disease.  Compared with prior CT, these infarcts are not visible.  IMPRESSION: Multiple acute right cerebellar infarcts without hemorrhage or mass effect.  MRA HEAD  Findings: The internal carotid arteries are widely patent.  The basilar artery is widely patent with the right vertebral dominant. Left vertebral primarily supplies the posterior inferior cerebellar artery.  There is no intracranial stenosis or aneurysm.  No right vertebral or  right cerebellar branch occlusion is seen.  IMPRESSION: Unremarkable MR angiography intracranial circulation.  Original Report Authenticated By: Elsie Stain, M.D.   Mr Mra Head/brain Wo Cm  04/03/2012  *RADIOLOGY REPORT*  Clinical Data:  Sudden onset of slurred speech and ataxia with difficulty walking.  MRI HEAD WITHOUT CONTRAST MRA HEAD WITHOUT CONTRAST  Technique:  Multiplanar, multiecho pulse sequences of the brain and surrounding structures were obtained without intravenous contrast. Angiographic images of the head were obtained using MRA technique without contrast.  Comparison:  CT head 04/02/2012  MRI HEAD  Findings:  Acute  right-sided  infarcts affect the cerebellum inferolaterally, within the right middle cerebellar peduncle, and involve the the superior vermis.  There is no associated hemorrhage.  No supratentorial infarcts are seen.  There is atrophy with chronic microvascular ischemic change.  No midline shift.  Normal pituitary and cerebellar tonsils.  Negative orbits.  No acute sinus or mastoid disease.  Compared with prior CT, these infarcts are not visible.  IMPRESSION: Multiple acute right cerebellar infarcts without hemorrhage or mass effect.  MRA HEAD  Findings: The internal carotid arteries are widely patent.  The basilar artery is widely patent with the right vertebral dominant. Left vertebral primarily supplies the posterior inferior cerebellar artery.  There is no intracranial stenosis or aneurysm.  No right vertebral or  right cerebellar branch occlusion is seen.  IMPRESSION: Unremarkable MR angiography intracranial circulation.  Original Report Authenticated By: Elsie Stain, M.D.    Medications:  Prior to Admission:  Prescriptions prior to admission  Medication Sig Dispense Refill  . aspirin 81 MG tablet Take 81 mg by mouth daily.      . benazepril (LOTENSIN) 10 MG tablet Take 10 mg by mouth daily.      . Canagliflozin (INVOKANA) 100 MG TABS Take 1 tablet by mouth daily.      . hydrochlorothiazide (HYDRODIURIL) 25 MG tablet Take 25 mg by mouth daily.      . Multiple Vitamins-Minerals (CENTRUM SILVER PO) Take 1 tablet by mouth daily.      . pantoprazole (PROTONIX) 40 MG tablet Take 40 mg by mouth daily.      . simvastatin (ZOCOR) 40 MG tablet Take 40 mg by mouth every evening.      . sitaGLIPtan-metformin (JANUMET) 50-1000 MG per tablet Take 1 tablet by mouth 2 (two) times daily with a meal.       Scheduled:   . benazepril  10 mg Oral Daily  . enoxaparin (LOVENOX) injection  40 mg Subcutaneous Q24H  . linagliptin  5 mg Oral Daily  . metFORMIN  1,000 mg Oral BID WC  . simvastatin  20 mg Oral q1800  . sodium  chloride  3 mL Intravenous Q12H   Continuous:   Assessment/Plan: Right cerebellar infarct, aspirin to Aggrenox, await CT angiogram and further recommendations by neurology. Aggressive risk factor modification indicated in regards to diabetes hypertension and cholesterol. Hypertension continue current meds Diabetes A1c uncontrolled. Patient plans to work harder on this. Further outpatient adjustment will be made Hypercholesterolemia  LOS: 2 days   Jonell Krontz D 04/04/2012, 10:06 AM   2
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
## 2012-04-05 LAB — GLUCOSE, CAPILLARY
Glucose-Capillary: 196 mg/dL — ABNORMAL HIGH (ref 70–99)
Glucose-Capillary: 176 mg/dL — ABNORMAL HIGH (ref 70–99)
Glucose-Capillary: 190 mg/dL — ABNORMAL HIGH (ref 70–99)
Glucose-Capillary: 243 mg/dL — ABNORMAL HIGH (ref 70–99)

## 2012-04-05 MED ORDER — ASPIRIN-DIPYRIDAMOLE ER 25-200 MG PO CP12
1.0000 | ORAL_CAPSULE | Freq: Every day | ORAL | Status: DC
  Filled 2012-04-05: qty 1

## 2012-04-05 MED ORDER — ASPIRIN-DIPYRIDAMOLE ER 25-200 MG PO CP12: 1.0000 | ORAL_CAPSULE | Freq: Two times a day (BID) | ORAL | Status: DC

## 2012-04-05 MED ORDER — ASPIRIN 81 MG PO TABS: 81.0000 mg | ORAL_TABLET | Freq: Every day | ORAL | Status: AC

## 2012-04-05 MED ORDER — ASPIRIN EC 81 MG PO TBEC
81.0000 mg | DELAYED_RELEASE_TABLET | Freq: Every day | ORAL | Status: DC
  Administered 2012-04-05: 81 mg via ORAL
  Filled 2012-04-05: qty 1

## 2012-04-05 MED ORDER — ASPIRIN-DIPYRIDAMOLE ER 25-200 MG PO CP12: 1.0000 | ORAL_CAPSULE | Freq: Two times a day (BID) | ORAL | Status: AC

## 2012-04-05 MED ORDER — ACETAMINOPHEN 325 MG PO TABS
650.0000 mg | ORAL_TABLET | ORAL | Status: DC
Start: 2012-04-05 — End: 2012-04-05

## 2012-04-05 MED ORDER — ACETAMINOPHEN 325 MG PO TABS: 650.0000 mg | ORAL_TABLET | ORAL | Status: AC

## 2012-04-05 MED ORDER — ATORVASTATIN CALCIUM 40 MG PO TABS: 40.0000 mg | ORAL_TABLET | Freq: Every day | ORAL | Status: DC

## 2012-04-05 NOTE — Progress Notes (Signed)
Patient evaluated for long-term disease management services with [facility] as a benefit from his [facility] insurance. Patient will receive a post discharge transition of care call and monthly home visits for assessments and for education.      Raiford Noble, MSN-Ed, RN, BSN [facility], [phone]

## 2012-04-05 NOTE — Progress Notes (Signed)
Utilization review complete 

## 2012-04-05 NOTE — Discharge Summary (Signed)
Physician Discharge Summary  NAME:Daniel Morrow  ZOX:[id number]  DOB: [birth year]   Admit date: 04/02/2012 Discharge date: 04/05/2012  Admitting Diagnosis: Dizziness  Discharge Diagnoses:  Principal Problem:  *Acute ischemic multifocal posterior circulation stroke Active Problems:  Hyperlipidemia  Hypertension  Diabetes mellitus   Discharge Condition: improved  Hospital Course: Patient admitted with imbalance and dizziness of acute onset. Initial CT negative but MRI showed evidence of multifocal posterior circulation stroke. He had an MRA that did not show much disease there but a CTA showed a 75-90% right vertebral (dominant) blockage. Although dissection could not be ruled out, it was not thought to be worth risk of catheter angiogram as there was no clear indication for stenting.   In order to decrease risk, ASA was changed to Aggrenox and simvastain was changed to atorvastatin.   No PT was thought to be necessary  Consults: Neurology  Disposition: 01-Home or Self Care  Discharge Orders    Future Orders Please Complete By Expires   Diet - low sodium heart healthy      Increase activity slowly      Discharge instructions      Comments:   Be sure to note instructions about Aggrenox     Medication List  As of 04/05/2012  1:09 PM   STOP taking these medications         simvastatin 40 MG tablet         TAKE these medications         acetaminophen 325 MG tablet   Commonly known as: TYLENOL   Take 2 tablets (650 mg total) by mouth daily. Take one hour before Aggrenox if needed to reduce headache      aspirin 81 MG tablet   Take 1 tablet (81 mg total) by mouth daily. Stop taking when you start taking Aggrenox twice daily      atorvastatin 40 MG tablet   Commonly known as: LIPITOR   Take 1 tablet (40 mg total) by mouth daily at 6 PM.      benazepril 10 MG tablet   Commonly known as: LOTENSIN   Take 10 mg by mouth daily.      CENTRUM SILVER PO   Take 1 tablet by  mouth daily.      dipyridamole-aspirin 200-25 MG per 12 hr capsule   Commonly known as: AGGRENOX   Take 1 capsule by mouth 2 (two) times daily. (for first two weeks, take this at bedtime only)      hydrochlorothiazide 25 MG tablet   Commonly known as: HYDRODIURIL   Take 25 mg by mouth daily.      INVOKANA 100 MG Tabs   Generic drug: Canagliflozin   Take 1 tablet by mouth daily.      JANUMET 50-1000 MG per tablet   Generic drug: sitaGLIPtan-metformin   Take 1 tablet by mouth 2 (two) times daily with a meal.      pantoprazole 40 MG tablet   Commonly known as: PROTONIX   Take 40 mg by mouth daily.           Follow-up Information    Follow up with Lesly Dukes, MD in 4 weeks.   Contact information:   [address], [address] [place] [address] [facility] [place] [address] [phone]       Follow up with Darnelle Bos, MD. (in the next two weeks - we will call you to make an appointment)    Contact information:  [address], [place] [place], [place]. [facility] [place] [address] [phone]          Things to follow up in the outpatient setting: PCP to set up followup with endocrine as A1c was 8.1.   Time coordinating discharge: 45 minutes including medication reconciliation, transmission of prescriptions to pharmacy, preparation of discharge papers, and discussion with patient and family    Signed: Darnelle Bos 04/05/2012, 1:09 PM

## 2012-04-05 NOTE — Progress Notes (Addendum)
Subjective: No new symptoms over night  Objective:  Vital Signs: Filed Vitals:   04/04/12 0940 04/04/12 1756 04/04/12 2232 04/05/12 0700  BP: 150/67 133/61 130/59 131/75  Pulse: 76 85 70 70  Temp: 97.8 F (36.6 C) 97.9 F (36.6 C) 98 F (36.7 C)   TempSrc: Oral Oral Oral   Resp: 18 18 20 20   Height:      Weight:      SpO2: 96% 95% 96% 98%     EXAM: none done   Intake/Output Summary (Last 24 hours) at 04/05/12 0821 Last data filed at 04/05/12 0806  Gross per 24 hour  Intake    600 ml  Output      0 ml  Net    600 ml    Lab Results:  Basename 04/03/12 0530 04/02/12 2320  NA 141 142  K 3.6 4.1  CL 103 101  CO2 28 29  GLUCOSE 167* 218*  BUN 22 25*  CREATININE 1.00 1.11  CALCIUM 9.2 9.4  MG -- --  PHOS -- --    Basename 04/02/12 2320  AST 16  ALT 16  ALKPHOS 72  BILITOT 0.8  PROT 7.4  ALBUMIN 4.1   No results found for this basename: LIPASE:2,AMYLASE:2 in the last 72 hours  Basename 04/03/12 0530 04/02/12 2320  WBC 7.2 11.0*  NEUTROABS -- 9.1*  HGB 13.3 14.0  HCT 40.5 42.8  MCV 81.7 81.8  PLT 211 203    Basename 04/02/12 2320  CKTOTAL 126  CKMB 4.2*  CKMBINDEX --  TROPONINI <0.30   No components found with this basename: POCBNP:3 No results found for this basename: DDIMER:2 in the last 72 hours  Basename 04/02/12 2320  HGBA1C 8.1*    Basename 04/03/12 0600  CHOL 154  HDL 40  LDLCALC 86  TRIG 140  CHOLHDL 3.9  LDLDIRECT --   No results found for this basename: TSH,T4TOTAL,FREET3,T3FREE,THYROIDAB in the last 72 hours No results found for this basename: VITAMINB12:2,FOLATE:2,FERRITIN:2,TIBC:2,IRON:2,RETICCTPCT:2 in the last 72 hours  Studies/Results: Ct Angio Head W/cm &/or Wo Cm  04/04/2012  *RADIOLOGY REPORT*  Clinical Data:  Multiple right cerebellar strokes. Evaluate for right vertebral dissection.  CT ANGIOGRAPHY HEAD AND NECK  Technique:  Multidetector CT imaging of the head and neck was performed using the standard protocol  during bolus administration of intravenous contrast.  Multiplanar CT image reconstructions including MIPs were obtained to evaluate the vascular anatomy. Carotid stenosis measurements (when applicable) are obtained utilizing NASCET criteria, using the distal internal carotid diameter as the denominator.  Contrast: 50mL OMNIPAQUE IOHEXOL 350 MG/ML SOLN  Comparison:   None.  CTA NECK  Findings:  Dolichoectatic but patent proximal great vessels.  Left vertebral hypoplastic.  Calcific plaque involving the origin of the right vertebral appears flow reducing, possible 75-90% stenosis.  Above this the right vertebral vessel appears normal.  A slight linear defect is seen on image 57 series 7 is felt to represent Hounsfield artifact from shoulder and not a localized dissection, as the vessel above and below this appears normal.  Calcific nonstenotic plaque left carotid bifurcation and right carotid bifurcation without flow reducing lesions.  No carotid dissection.  No neck masses.  The lung apices clear.  Airway midline.  Shotty cervical adenopathy.  Moderately advanced cervical spondylosis with disc space narrowing most pronounced at C5-6 and C6-7.   Review of the MIP images confirms the above findings.  IMPRESSION: Calcific plaque involving the origin of the right vertebral appears flow reducing, possible  75-90% stenosis.  No visible right vertebral dissection. Consider formal catheter angiogram for confirmation.  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  Basilar artery patent with right vertebral dominant.  No focal posterior fossa stenoses.  Bilateral fetal origin PCAs from the carotids results in slight basilar hypoplasia.  No cerebellar branch occlusion.  Subtle foci of cytotoxic edema and right cerebellar hemisphere and vermis reflect prior MR demonstrated acute stroke. No hemorrhagic transformation or abnormal post contrast enhancement.   Review of the  MIP images confirms the above findings.  IMPRESSION: No intracranial stenosis or dissection.  Evolving right cerebellar strokes.  Original Report Authenticated By: Elsie Stain, M.D.   Ct Angio Neck W/cm &/or Wo/cm  04/04/2012  *RADIOLOGY REPORT*  Clinical Data:  Multiple right cerebellar strokes. Evaluate for right vertebral dissection.  CT ANGIOGRAPHY HEAD AND NECK  Technique:  Multidetector CT imaging of the head and neck was performed using the standard protocol during bolus administration of intravenous contrast.  Multiplanar CT image reconstructions including MIPs were obtained to evaluate the vascular anatomy. Carotid stenosis measurements (when applicable) are obtained utilizing NASCET criteria, using the distal internal carotid diameter as the denominator.  Contrast: 50mL OMNIPAQUE IOHEXOL 350 MG/ML SOLN  Comparison:   None.  CTA NECK  Findings:  Dolichoectatic but patent proximal great vessels.  Left vertebral hypoplastic.  Calcific plaque involving the origin of the right vertebral appears flow reducing, possible 75-90% stenosis.  Above this the right vertebral vessel appears normal.  A slight linear defect is seen on image 57 series 7 is felt to represent Hounsfield artifact from shoulder and not a localized dissection, as the vessel above and below this appears normal.  Calcific nonstenotic plaque left carotid bifurcation and right carotid bifurcation without flow reducing lesions.  No carotid dissection.  No neck masses.  The lung apices clear.  Airway midline.  Shotty cervical adenopathy.  Moderately advanced cervical spondylosis with disc space narrowing most pronounced at C5-6 and C6-7.   Review of the MIP images confirms the above findings.  IMPRESSION: Calcific plaque involving the origin of the right vertebral appears flow reducing, possible 75-90% stenosis.  No visible right vertebral dissection. Consider formal catheter angiogram for confirmation.  CTA HEAD  Findings:  Calcific but  nonstenotic internal carotid artery disease bilaterally in the siphons.  Basilar artery patent with right vertebral dominant.  No focal posterior fossa stenoses.  Bilateral fetal origin PCAs from the carotids results in slight basilar hypoplasia.  No cerebellar branch occlusion.  Subtle foci of cytotoxic edema and right cerebellar hemisphere and vermis reflect prior MR demonstrated acute stroke. No hemorrhagic transformation or abnormal post contrast enhancement.   Review of the MIP images confirms the above findings.  IMPRESSION: No intracranial stenosis or dissection.  Evolving right cerebellar strokes.  Original Report Authenticated By: Elsie Stain, M.D.   Mri Brain Without Contrast  04/03/2012  *RADIOLOGY REPORT*  Clinical Data:  Sudden onset of slurred speech and ataxia with difficulty walking.  MRI HEAD WITHOUT CONTRAST MRA HEAD WITHOUT CONTRAST  Technique:  Multiplanar, multiecho pulse sequences of the brain and surrounding structures were obtained without intravenous contrast. Angiographic images of the head were obtained using MRA technique without contrast.  Comparison:  CT head 04/02/2012  MRI HEAD  Findings:  Acute  right-sided infarcts affect the cerebellum inferolaterally, within the right middle cerebellar peduncle, and involve the the superior vermis.  There is no associated hemorrhage.  No  supratentorial infarcts are seen.  There is atrophy with chronic microvascular ischemic change.  No midline shift.  Normal pituitary and cerebellar tonsils.  Negative orbits.  No acute sinus or mastoid disease.  Compared with prior CT, these infarcts are not visible.  IMPRESSION: Multiple acute right cerebellar infarcts without hemorrhage or mass effect.  MRA HEAD  Findings: The internal carotid arteries are widely patent.  The basilar artery is widely patent with the right vertebral dominant. Left vertebral primarily supplies the posterior inferior cerebellar artery.  There is no intracranial stenosis or  aneurysm.  No right vertebral or  right cerebellar branch occlusion is seen.  IMPRESSION: Unremarkable MR angiography intracranial circulation.  Original Report Authenticated By: Elsie Stain, M.D.   Mr Mra Head/brain Wo Cm  04/03/2012  *RADIOLOGY REPORT*  Clinical Data:  Sudden onset of slurred speech and ataxia with difficulty walking.  MRI HEAD WITHOUT CONTRAST MRA HEAD WITHOUT CONTRAST  Technique:  Multiplanar, multiecho pulse sequences of the brain and surrounding structures were obtained without intravenous contrast. Angiographic images of the head were obtained using MRA technique without contrast.  Comparison:  CT head 04/02/2012  MRI HEAD  Findings:  Acute  right-sided infarcts affect the cerebellum inferolaterally, within the right middle cerebellar peduncle, and involve the the superior vermis.  There is no associated hemorrhage.  No supratentorial infarcts are seen.  There is atrophy with chronic microvascular ischemic change.  No midline shift.  Normal pituitary and cerebellar tonsils.  Negative orbits.  No acute sinus or mastoid disease.  Compared with prior CT, these infarcts are not visible.  IMPRESSION: Multiple acute right cerebellar infarcts without hemorrhage or mass effect.  MRA HEAD  Findings: The internal carotid arteries are widely patent.  The basilar artery is widely patent with the right vertebral dominant. Left vertebral primarily supplies the posterior inferior cerebellar artery.  There is no intracranial stenosis or aneurysm.  No right vertebral or  right cerebellar branch occlusion is seen.  IMPRESSION: Unremarkable MR angiography intracranial circulation.  Original Report Authenticated By: Elsie Stain, M.D.   Medications: Medications administered in the last 24 hours reviewed.  Current Medication List reviewed.   Assessment/Plan: Principal Problem:  *Acute ischemic multifocal posterior circulation stroke - CTA results noted. Dr. Benard Rink seemed to be hedging about  presence or absence of dissection. Possible cath angio recommended. Discussed issues with family (patient, wife, dtr) and noted (1) he has 75% plaque and we are doing all we can to keep is stable (was on ASA, now Aggrenox; was on statin which has been changed); and (2) still not sure about dissection. I did tell them that I was not aware of any specific intervention that could be done to a vertebral artery dissection but that I was not knowledgeable in such areas. However, if there is no specific intervention, then I would recommend against the risk of the test. I told them I would have to leave that up to Dr. Anne Hahn, et al. They seemed concerned that he was at "high risk" and that we "could not do anything about that." I told them that ASA, statin, good BP and good DM control were the mainstays of trying to prevent another event.  Active Problems:  Hyperlipidemia  Hypertension - adequate control.   Diabetes mellitus - A1c here was > 8. Last A1c in Dr. Daune Perch office was 7.8  Would appreciate call from neuro after their evaluation so I can get him home today if no further tests. My number is  [phone]   LOS: 3 days   Daniel Morrow,Daniel Morrow 04/05/2012, 8:21 AM
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
## 2013-02-17 ENCOUNTER — Encounter: Payer: Self-pay | Admitting: Neurology

## 2013-02-17 DIAGNOSIS — I6509 Occlusion and stenosis of unspecified vertebral artery: Secondary | ICD-10-CM | POA: Insufficient documentation

## 2013-02-17 DIAGNOSIS — I639 Cerebral infarction, unspecified: Secondary | ICD-10-CM

## 2013-02-17 DIAGNOSIS — G3184 Mild cognitive impairment, so stated: Secondary | ICD-10-CM | POA: Insufficient documentation

## 2013-02-22 ENCOUNTER — Ambulatory Visit: Payer: Self-pay | Admitting: Neurology

## 2013-02-24 DIAGNOSIS — Y92009 Unspecified place in unspecified non-institutional (private) residence as the place of occurrence of the external cause: Secondary | ICD-10-CM | POA: Insufficient documentation

## 2013-02-24 DIAGNOSIS — Y93H9 Activity, other involving exterior property and land maintenance, building and construction: Secondary | ICD-10-CM | POA: Insufficient documentation

## 2013-02-24 DIAGNOSIS — E785 Hyperlipidemia, unspecified: Secondary | ICD-10-CM | POA: Insufficient documentation

## 2013-02-24 DIAGNOSIS — Z23 Encounter for immunization: Secondary | ICD-10-CM | POA: Insufficient documentation

## 2013-02-24 DIAGNOSIS — W108XXA Fall (on) (from) other stairs and steps, initial encounter: Secondary | ICD-10-CM | POA: Insufficient documentation

## 2013-02-24 DIAGNOSIS — I10 Essential (primary) hypertension: Secondary | ICD-10-CM | POA: Insufficient documentation

## 2013-02-24 DIAGNOSIS — Z79899 Other long term (current) drug therapy: Secondary | ICD-10-CM | POA: Insufficient documentation

## 2013-02-24 DIAGNOSIS — S91109A Unspecified open wound of unspecified toe(s) without damage to nail, initial encounter: Secondary | ICD-10-CM | POA: Insufficient documentation

## 2013-02-24 DIAGNOSIS — E119 Type 2 diabetes mellitus without complications: Secondary | ICD-10-CM | POA: Insufficient documentation

## 2013-02-24 NOTE — ED Notes (Signed)
A Cut on the underside of the right big toe.  Can bend, no numbness or tingling. Bleeding controlled

## 2013-02-25 ENCOUNTER — Emergency Department (HOSPITAL_COMMUNITY)
Admission: EM | Admit: 2013-02-25 | Discharge: 2013-02-25 | Disposition: A | Discharge status: Home / Self Care | Payer: Medicare Other | Attending: Emergency Medicine | Admitting: Emergency Medicine

## 2013-02-25 DIAGNOSIS — S91311A Laceration without foreign body, right foot, initial encounter: Secondary | ICD-10-CM

## 2013-02-25 MED ORDER — OXYCODONE-ACETAMINOPHEN 5-325 MG PO TABS
1.0000 | ORAL_TABLET | Freq: Once | ORAL | Status: AC
  Administered 2013-02-25: 1 via ORAL
  Filled 2013-02-25: qty 1

## 2013-02-25 MED ORDER — AMOXICILLIN-POT CLAVULANATE 875-125 MG PO TABS
1.0000 | ORAL_TABLET | Freq: Once | ORAL | Status: AC
  Administered 2013-02-25: 1 via ORAL
  Filled 2013-02-25: qty 1

## 2013-02-25 MED ORDER — OXYCODONE-ACETAMINOPHEN 5-325 MG PO TABS: 2.0000 | ORAL_TABLET | ORAL | Status: DC | PRN

## 2013-02-25 MED ORDER — AMOXICILLIN-POT CLAVULANATE 875-125 MG PO TABS: 1.0000 | ORAL_TABLET | Freq: Two times a day (BID) | ORAL | Status: DC

## 2013-02-25 MED ORDER — TETANUS-DIPHTH-ACELL PERTUSSIS 5-2.5-18.5 LF-MCG/0.5 IM SUSP
0.5000 mL | Freq: Once | INTRAMUSCULAR | Status: AC
  Administered 2013-02-25: 0.5 mL via INTRAMUSCULAR

## 2013-02-25 NOTE — ED Provider Notes (Signed)
History     CSN: [id number]  Arrival date & time 02/24/13  2342   First MD Initiated Contact with Patient 02/25/13 [phone]      Chief Complaint  Patient presents with  . Extremity Laceration    (Consider location/radiation/quality/duration/timing/severity/associated sxs/prior treatment) The history is provided by the patient.  Daniel Morrow is a 72 y.o. male hx of DM, HTN here with R toe injury. He was wearing his flip flops and was cleaning the pool. The flip flops broke and he fell on concrete step and had a laceration R big toe. Unknown tetanus status. Came here for eval. No numbness or weakness in foot.    Past Medical History  Diagnosis Date  . Hypertension   . Diabetes mellitus   . Hyperlipidemia     No past surgical history on file.  No family history on file.  History  Substance Use Topics  . Smoking status: Never Smoker   . Smokeless tobacco: Not on file  . Alcohol Use: No      Review of Systems  Skin: Positive for wound.  All other systems reviewed and are negative.    Allergies  Review of patient's allergies indicates no known allergies.  Home Medications   Current Outpatient Rx  Name  Route  Sig  Dispense  Refill  . acetaminophen (TYLENOL) 325 MG tablet   Oral   Take 2 tablets (650 mg total) by mouth daily. Take one hour before Aggrenox if needed to reduce headache         . atorvastatin (LIPITOR) 40 MG tablet   Oral   Take 1 tablet (40 mg total) by mouth daily at 6 PM.         . benazepril (LOTENSIN) 10 MG tablet   Oral   Take 10 mg by mouth daily.         . Canagliflozin (INVOKANA) 100 MG TABS   Oral   Take 1 tablet by mouth daily.         Marland Kitchen dipyridamole-aspirin (AGGRENOX) 200-25 MG per 12 hr capsule   Oral   Take 1 capsule by mouth 2 (two) times daily. (for first two weeks, take this at bedtime only)         . hydrochlorothiazide (HYDRODIURIL) 25 MG tablet   Oral   Take 25 mg by mouth daily.         . Multiple  Vitamins-Minerals (CENTRUM SILVER PO)   Oral   Take 1 tablet by mouth daily.         . pantoprazole (PROTONIX) 40 MG tablet   Oral   Take 40 mg by mouth daily.         . sitaGLIPtan-metformin (JANUMET) 50-1000 MG per tablet   Oral   Take 1 tablet by mouth 2 (two) times daily with a meal.           BP 140/74  Pulse 71  Temp(Src) 98.2 F (36.8 C) (Oral)  SpO2 96%  Physical Exam  Nursing note and vitals reviewed. Constitutional: He is oriented to person, place, and time. He appears well-developed and well-nourished.  HENT:  Head: Normocephalic.  Mouth/Throat: Oropharynx is clear and moist.  Eyes: Conjunctivae are normal. Pupils are equal, round, and reactive to light.  Neck: Normal range of motion.  Cardiovascular: Normal rate.   Pulmonary/Chest: Effort normal.  Abdominal: Soft.  Musculoskeletal:       Feet:  There is laceration across the plantar aspect of R big toe. Nl  capillary refill. 2+ DP pulse able to wiggle big toe. No obvious tendons visualized   Neurological: He is alert and oriented to person, place, and time.  Skin: Skin is warm and dry.  Psychiatric: He has a normal mood and affect. His behavior is normal. Judgment and thought content normal.    ED Course  Procedures (including critical care time)  Labs Reviewed - No data to display No results found.   No diagnosis found.  LACERATION REPAIR Performed by: Chaney Malling Authorized by: Chaney Malling Consent: Verbal consent obtained. Risks and benefits: risks, benefits and alternatives were discussed Consent given by: patient Patient identity confirmed: provided demographic data Prepped and Draped in normal sterile fashion Wound explored  Laceration Location: R big toe  Laceration Length: 5 cm  No Foreign Bodies seen or palpated  Anesthesia: local infiltration  Local anesthetic: lidocaine 2% without epinephrine  Anesthetic total: 5 ml  Irrigation method: syringe Amount of cleaning:  standard  Skin closure: Suture  Number of sutures: 2 4-0 vicryl and 12 4-0 ethilon  Technique: mattress sutures with vicryl and simple interrupted with ethilon   Patient tolerance: Patient tolerated the procedure well with no immediate complications.    MDM  Daniel Morrow is a 72 y.o. male here with R big toe laceration. Tetanus updated. Laceration required two layer repair. Given that he is diabetic, will need to give augmentin empirically. He will get wound check in 3 days and suture removal in 10 days.         Daniel Canal, MD 02/25/13 [phone]

## 2013-02-25 NOTE — ED Notes (Signed)
Care assumed at this time, report taken on patient, nad noted,

## 2013-05-18 ENCOUNTER — Ambulatory Visit (INDEPENDENT_AMBULATORY_CARE_PROVIDER_SITE_OTHER): Payer: Medicare Other | Admitting: Neurology

## 2013-05-18 ENCOUNTER — Encounter: Payer: Self-pay | Admitting: Neurology

## 2013-05-18 DIAGNOSIS — I6509 Occlusion and stenosis of unspecified vertebral artery: Secondary | ICD-10-CM

## 2013-05-18 NOTE — Patient Instructions (Addendum)
Continue Aggrenox for stroke prevention and strict control of diabetes with hemoglobin A1c goal below 6.5%, lipids with LDL cholesterol goal below 70 mg percent and hypertension with blood pressure goal below 120/80. Check followup carotid ultrasound today. Return for followup in 6 months with Larita Fife, NP

## 2013-05-18 NOTE — Progress Notes (Signed)
[facility] [address] [place]. [place] [address] [phone]       OFFICE FOLLOW-UP NOTE  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 Vascular risk factors of diabetes, hypertension, hyperlipidemia and mild obesity. 05/18/2013 Today he returns for followup after last visit on 08/24/2012. She continues to do well without any recurrent stroke or TIA symptoms. Historically the Aggrenox well without headaches or side effects. He and his wife are in the donor pool and I worried about the cost but like to continue it since she's done so well. He states his sugar is under good control and blood pressure is doing fine it is 137/77 one in office today. He is not sure when he had his last lipid profile checked but apparently it was fine. He has been having mild short-term memory difficulties which are long-standing and not progressive. He has no new complaints today.   ROS:   14 system review of systems is positive for no complaints today PMH:  Past Medical History  Diagnosis Date  . Hypertension   . Diabetes mellitus   . Hyperlipidemia     Social History:  History   Social History  . Marital Status: Married    Spouse Name: [name]    Number of Children: 3  . Years of Education: College   Occupational History  . Retired    Social History Main Topics  . Smoking status: Never Smoker   . Smokeless tobacco: Never Used  . Alcohol Use: No  . Drug Use: No  . Sexual Activity: Not on file   Other Topics Concern  . Not on file   Social History Narrative   Patient lives at home with family.   Caffeine Use: 2 cups daily    Medications:   Current Outpatient Prescriptions on File Prior to Visit  Medication Sig Dispense Refill  . benazepril  (LOTENSIN) 10 MG tablet Take 10 mg by mouth daily.      . hydrochlorothiazide (HYDRODIURIL) 25 MG tablet Take 25 mg by mouth daily.      . Multiple Vitamins-Minerals (CENTRUM SILVER PO) Take 1 tablet by mouth daily.      . pantoprazole (PROTONIX) 40 MG tablet Take 40 mg by mouth daily.      . sitaGLIPtan-metformin (JANUMET) 50-1000 MG per tablet Take 1 tablet by mouth 2 (two) times daily with a meal.      . atorvastatin (LIPITOR) 40 MG tablet Take 1 tablet (40 mg total) by mouth daily at 6 PM. Aggrenox 1 capsule twice daily       No current facility-administered medications on file prior to visit.    Allergies:  No Known Allergies  Physical Exam General: well developed, well nourished, seated, in no evident distress Head: head normocephalic and atraumatic. Orohparynx benign Neck: supple with no carotid or supraclavicular bruits Cardiovascular: regular rate and rhythm, no murmurs Musculoskeletal: no deformity Skin:  no rash/petichiae Vascular:  Normal pulses all extremities Filed Vitals:   05/18/13 1508  BP: 137/71  Pulse: 93  Temp: 98 F (36.7 C)    Neurologic Exam Mental Status: Awake and fully alert. Oriented to place and time. Recent and remote memory intact. Attention span, concentration and fund of knowledge appropriate. Mood and affect appropriate.  Cranial Nerves: Fundoscopic exam reveals sharp  disc margins. Pupils equal, briskly reactive to light. Extraocular movements full without nystagmus. Visual fields full to confrontation. Hearing intact. Facial sensation intact. Face, tongue, palate moves normally and symmetrically.  Motor: Normal bulk and tone. Normal strength in all tested extremity muscles. Sensory.: intact to touch and pinprick and vibratory sensation.  Coordination: Rapid alternating movements normal in all extremities. Finger-to-nose and heel-to-shin performed accurately bilaterally. Gait and Station: Arises from chair without difficulty. Stance is normal. Gait  demonstrates normal stride length and balance . Able to heel, toe and tandem walk with minimal difficulty.  Reflexes: 1+ and symmetric. Toes downgoing.     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 Vascular risk factors of diabetes, hypertension, hyperlipidemia and mild obesity.    PLAN: Continue conservative followup for right vertebral artery stenosis as is doing well without any recurrent symptoms. Continue Aggrenox for stroke prevention and strict control of diabetes with hemoglobin A1c goal below 6.5%, lipids with LDL cholesterol goal below 70 mg percent and hypertension with blood pressure goal below 120/80. Check followup carotid ultrasound today. Return for followup in 6 months with Larita Fife, NP

## 2013-06-07 ENCOUNTER — Ambulatory Visit (INDEPENDENT_AMBULATORY_CARE_PROVIDER_SITE_OTHER): Payer: Medicare Other

## 2013-06-07 DIAGNOSIS — I6350 Cerebral infarction due to unspecified occlusion or stenosis of unspecified cerebral artery: Secondary | ICD-10-CM

## 2013-06-24 ENCOUNTER — Telehealth: Payer: Self-pay | Admitting: Neurology

## 2013-06-24 NOTE — Telephone Encounter (Signed)
I called pt and relayed that doppler study results negative for significant stenosis.  He verbalized understanding.

## 2013-11-17 ENCOUNTER — Ambulatory Visit: Payer: Medicare Other | Admitting: Nurse Practitioner

## 2013-12-01 ENCOUNTER — Encounter (HOSPITAL_COMMUNITY): Payer: Self-pay | Admitting: Emergency Medicine

## 2013-12-01 DIAGNOSIS — N433 Hydrocele, unspecified: Secondary | ICD-10-CM | POA: Diagnosis present

## 2013-12-01 DIAGNOSIS — E119 Type 2 diabetes mellitus without complications: Secondary | ICD-10-CM | POA: Diagnosis present

## 2013-12-01 DIAGNOSIS — Z8673 Personal history of transient ischemic attack (TIA), and cerebral infarction without residual deficits: Secondary | ICD-10-CM

## 2013-12-01 DIAGNOSIS — Z66 Do not resuscitate: Secondary | ICD-10-CM | POA: Diagnosis present

## 2013-12-01 DIAGNOSIS — E785 Hyperlipidemia, unspecified: Secondary | ICD-10-CM | POA: Diagnosis present

## 2013-12-01 DIAGNOSIS — I10 Essential (primary) hypertension: Secondary | ICD-10-CM | POA: Diagnosis present

## 2013-12-01 DIAGNOSIS — N453 Epididymo-orchitis: Principal | ICD-10-CM | POA: Diagnosis present

## 2013-12-01 NOTE — ED Notes (Signed)
Pt states that he was seen at his PCP for a a check up and had swollen testicle then (in feb.) this is the second time that he has had swollen testicles in the last 2 months. Pt was given an antibiotic from PCP for the swelling. Pt states that both testicles today are swollen and are painful.

## 2013-12-02 ENCOUNTER — Ambulatory Visit: Payer: Medicare Other | Admitting: Nurse Practitioner

## 2013-12-02 ENCOUNTER — Inpatient Hospital Stay (HOSPITAL_COMMUNITY)
Admission: EM | Admit: 2013-12-02 | Discharge: 2013-12-04 | DRG: 728 | Disposition: A | Discharge status: Home / Self Care | Payer: Medicare Other | Attending: Internal Medicine | Admitting: Internal Medicine

## 2013-12-02 ENCOUNTER — Emergency Department (HOSPITAL_COMMUNITY): Payer: Medicare Other

## 2013-12-02 DIAGNOSIS — I6350 Cerebral infarction due to unspecified occlusion or stenosis of unspecified cerebral artery: Secondary | ICD-10-CM

## 2013-12-02 DIAGNOSIS — N453 Epididymo-orchitis: Secondary | ICD-10-CM | POA: Diagnosis present

## 2013-12-02 DIAGNOSIS — E119 Type 2 diabetes mellitus without complications: Secondary | ICD-10-CM

## 2013-12-02 DIAGNOSIS — I639 Cerebral infarction, unspecified: Secondary | ICD-10-CM | POA: Diagnosis present

## 2013-12-02 DIAGNOSIS — I10 Essential (primary) hypertension: Secondary | ICD-10-CM | POA: Diagnosis present

## 2013-12-02 LAB — URINALYSIS, ROUTINE W REFLEX MICROSCOPIC
BILIRUBIN URINE: NEGATIVE
Glucose, UA: 100 mg/dL — AB
KETONES UR: NEGATIVE mg/dL
NITRITE: NEGATIVE
PH: 6 (ref 5.0–8.0)
PROTEIN: 30 mg/dL — AB
Specific Gravity, Urine: 1.021 (ref 1.005–1.030)
UROBILINOGEN UA: 0.2 mg/dL (ref 0.0–1.0)

## 2013-12-02 LAB — GLUCOSE, CAPILLARY
Glucose-Capillary: 165 mg/dL — ABNORMAL HIGH (ref 70–99)
Glucose-Capillary: 269 mg/dL — ABNORMAL HIGH (ref 70–99)
Glucose-Capillary: 157 mg/dL — ABNORMAL HIGH (ref 70–99)
Glucose-Capillary: 219 mg/dL — ABNORMAL HIGH (ref 70–99)

## 2013-12-02 LAB — CBC WITH DIFFERENTIAL/PLATELET
BASOS ABS: 0 10*3/uL (ref 0.0–0.1)
BASOS PCT: 0 % (ref 0–1)
EOS ABS: 0 10*3/uL (ref 0.0–0.7)
Eosinophils Relative: 0 % (ref 0–5)
HCT: 35.1 % — ABNORMAL LOW (ref 39.0–52.0)
HEMOGLOBIN: 11.6 g/dL — AB (ref 13.0–17.0)
Lymphocytes Relative: 5 % — ABNORMAL LOW (ref 12–46)
Lymphs Abs: 0.8 10*3/uL (ref 0.7–4.0)
MCH: 27.9 pg (ref 26.0–34.0)
MCHC: 33 g/dL (ref 30.0–36.0)
MCV: 84.4 fL (ref 78.0–100.0)
MONOS PCT: 8 % (ref 3–12)
Monocytes Absolute: 1.4 10*3/uL — ABNORMAL HIGH (ref 0.1–1.0)
NEUTROS PCT: 87 % — AB (ref 43–77)
Neutro Abs: 15 10*3/uL — ABNORMAL HIGH (ref 1.7–7.7)
Platelets: 193 10*3/uL (ref 150–400)
RBC: 4.16 MIL/uL — ABNORMAL LOW (ref 4.22–5.81)
RDW: 16.3 % — AB (ref 11.5–15.5)
WBC: 17.2 10*3/uL — ABNORMAL HIGH (ref 4.0–10.5)

## 2013-12-02 LAB — URINE MICROSCOPIC-ADD ON

## 2013-12-02 LAB — BASIC METABOLIC PANEL
BUN: 18 mg/dL (ref 6–23)
CO2: 27 mEq/L (ref 19–32)
CREATININE: 1.02 mg/dL (ref 0.50–1.35)
Calcium: 8.5 mg/dL (ref 8.4–10.5)
Chloride: 97 mEq/L (ref 96–112)
GFR, EST AFRICAN AMERICAN: 83 mL/min — AB (ref >90)
GFR, EST NON AFRICAN AMERICAN: 72 mL/min — AB (ref >90)
Glucose, Bld: 212 mg/dL — ABNORMAL HIGH (ref 70–99)
POTASSIUM: 3.9 meq/L (ref 3.7–5.3)
Sodium: 138 mEq/L (ref 137–147)

## 2013-12-02 LAB — I-STAT CG4 LACTIC ACID, ED: LACTIC ACID, VENOUS: 0.99 mmol/L (ref 0.5–2.2)

## 2013-12-02 MED ORDER — DEXTROSE 5 % IV SOLN
1.0000 g | Freq: Once | INTRAVENOUS | Status: AC
  Administered 2013-12-02: 1 g via INTRAVENOUS
  Filled 2013-12-02: qty 10

## 2013-12-02 MED ORDER — HYDROCODONE-ACETAMINOPHEN 5-325 MG PO TABS
1.0000 | ORAL_TABLET | Freq: Four times a day (QID) | ORAL | Status: DC | PRN
  Administered 2013-12-02: 1 via ORAL
  Filled 2013-12-02: qty 1

## 2013-12-02 MED ORDER — SODIUM CHLORIDE 0.9 % IV SOLN
INTRAVENOUS | Status: DC
  Administered 2013-12-02 – 2013-12-03 (×2): via INTRAVENOUS

## 2013-12-02 MED ORDER — PANTOPRAZOLE SODIUM 40 MG PO TBEC
40.0000 mg | DELAYED_RELEASE_TABLET | Freq: Every day | ORAL | Status: DC
  Administered 2013-12-02 – 2013-12-04 (×3): 40 mg via ORAL
  Filled 2013-12-02 (×4): qty 1

## 2013-12-02 MED ORDER — MORPHINE SULFATE 2 MG/ML IJ SOLN
2.0000 mg | Freq: Once | INTRAMUSCULAR | Status: AC
  Administered 2013-12-02: 2 mg via INTRAVENOUS
  Filled 2013-12-02: qty 1

## 2013-12-02 MED ORDER — PIPERACILLIN-TAZOBACTAM 3.375 G IVPB
3.3750 g | Freq: Three times a day (TID) | INTRAVENOUS | Status: DC
  Administered 2013-12-02 – 2013-12-04 (×6): 3.375 g via INTRAVENOUS
  Filled 2013-12-02 (×8): qty 50

## 2013-12-02 MED ORDER — ACETAMINOPHEN 325 MG PO TABS
650.0000 mg | ORAL_TABLET | Freq: Four times a day (QID) | ORAL | Status: DC | PRN
  Administered 2013-12-02 – 2013-12-03 (×5): 650 mg via ORAL
  Filled 2013-12-02 (×5): qty 2

## 2013-12-02 MED ORDER — ASPIRIN-DIPYRIDAMOLE ER 25-200 MG PO CP12
1.0000 | ORAL_CAPSULE | Freq: Two times a day (BID) | ORAL | Status: DC
  Administered 2013-12-02 – 2013-12-04 (×5): 1 via ORAL
  Filled 2013-12-02 (×6): qty 1

## 2013-12-02 MED ORDER — ATORVASTATIN CALCIUM 40 MG PO TABS
40.0000 mg | ORAL_TABLET | Freq: Every day | ORAL | Status: DC
  Administered 2013-12-02 – 2013-12-04 (×3): 40 mg via ORAL
  Filled 2013-12-02 (×3): qty 1

## 2013-12-02 MED ORDER — CIPROFLOXACIN IN D5W 400 MG/200ML IV SOLN
400.0000 mg | Freq: Once | INTRAVENOUS | Status: AC
  Administered 2013-12-02: 400 mg via INTRAVENOUS
  Filled 2013-12-02 (×2): qty 200

## 2013-12-02 MED ORDER — ADULT MULTIVITAMIN W/MINERALS CH
1.0000 | ORAL_TABLET | Freq: Every day | ORAL | Status: DC
  Administered 2013-12-02 – 2013-12-04 (×3): 1 via ORAL
  Filled 2013-12-02 (×3): qty 1

## 2013-12-02 MED ORDER — INSULIN ASPART 100 UNIT/ML ~~LOC~~ SOLN
0.0000 [IU] | Freq: Three times a day (TID) | SUBCUTANEOUS | Status: DC
  Administered 2013-12-02: 2 [IU] via SUBCUTANEOUS
  Administered 2013-12-02: 5 [IU] via SUBCUTANEOUS
  Administered 2013-12-03 (×2): 2 [IU] via SUBCUTANEOUS
  Administered 2013-12-03: 1 [IU] via SUBCUTANEOUS
  Administered 2013-12-04: 2 [IU] via SUBCUTANEOUS

## 2013-12-02 MED ORDER — ENOXAPARIN SODIUM 40 MG/0.4ML ~~LOC~~ SOLN
40.0000 mg | SUBCUTANEOUS | Status: DC
  Administered 2013-12-02 – 2013-12-03 (×2): 40 mg via SUBCUTANEOUS
  Filled 2013-12-02 (×3): qty 0.4

## 2013-12-02 MED ORDER — ONDANSETRON HCL 4 MG/2ML IJ SOLN: 4.0000 mg | Freq: Four times a day (QID) | INTRAMUSCULAR | Status: DC | PRN

## 2013-12-02 MED ORDER — BENAZEPRIL HCL 10 MG PO TABS
10.0000 mg | ORAL_TABLET | Freq: Every day | ORAL | Status: DC
  Administered 2013-12-02 – 2013-12-04 (×3): 10 mg via ORAL
  Filled 2013-12-02 (×3): qty 1

## 2013-12-02 NOTE — Consult Note (Signed)
Urology Consult  Referring physician: Dr Daniel Morrow Reason for referral: Epididymitis  Chief Complaint: Testicular pain and swelling  History of Present Illness: Daniel Morrow is a 73 years old male, patient of Daniel Daniel Morrow who presented to the ER last night with a 1 day history of bilateral testicular pain and swelling, left greater than right.  His wife reports that his temperature went up to 103.   His maximum temperature in the ER is 99.7.  The patient was seen by Daniel Daniel Morrow last month for testicular pain.  He was prescribed amoxicillin for 7 days and then SMZ-TMP for another 7 days.  He has a past history of epididymitis in 2012.  He is known to have a left hydrocele.  Scrotal ultrasound showed bilateral testicular and epididymal hypervascularity and a complex left hydrocele that is similar in complexity but smaller than on ultrasound of October 2012.  He voids well.  He had gross hematuria for 2 days a month ago.  He denies frequency, urgency, dysuria, hematuria.  CT scan hematuria protocol on 2/23 revealed bilateral renal cysts, a 3 mm lower pole non obstructing renal calculus.  WBC is elevated : 17.2 with 87% neutrophils. Urinalysis shows 21-50 WBC's, 3-6 RBC's, rare bacteria and nitrite negative. Urine culture is pending.   Past Medical History  Diagnosis Date  . Hypertension   . Diabetes mellitus   . Hyperlipidemia    History reviewed. No pertinent past surgical history.  Medications: Lovenox, Zosyn, atorvastatin, benazepril, Aggrenox, pantopazole, HCTZ, Prandin, Janumet Allergies: No Known Allergies  History reviewed. No pertinent family history. Social History:  reports that he has never smoked. He has never used smokeless tobacco. He reports that he does not drink alcohol or use illicit drugs.  ROS: All systems are reviewed and negative except as noted.   Physical Exam:  Vital signs in last 24 hours: Temp:  [98.5 F (36.9 C)-99.7 F (37.6 C)] 98.5 F (36.9 C) (03/13  0812) Pulse Rate:  [87-109] 87 (03/13 0812) Resp:  [16-18] 18 (03/13 0812) BP: (103-133)/(52-66) 122/66 mmHg (03/13 0812) SpO2:  [92 %-95 %] 93 % (03/13 0812) Weight:  [85.276 kg (188 lb)-85.684 kg (188 lb 14.4 oz)] 85.684 kg (188 lb 14.4 oz) (03/13 0809) HEENT: Normal Cardiovascular: Skin warm; not flushed Respiratory: Breaths quiet; no shortness of breath Abdomen: No masses Neurological: Normal sensation to touch Musculoskeletal: Normal motor function arms and legs Lymphatics: No inguinal adenopathy Skin: No rashes Genitourinary:Penis is circumcised.  Meatus is normal.  The scrotum is slightly enlarged.  The left scrotum is larger than the right.  Both testicles are tender but the tenderness is worse on the left side.  Rectal Examination: Sphincter tone is normal. Prostate is enlarged, non tender.  Seminal vesicles are not palpable.  Laboratory Data:  Results for orders placed during the hospital encounter of 12/02/13 (from the past 72 hour(s))  I-STAT CG4 LACTIC ACID, ED     Status: None   Collection Time    12/02/13  5:43 AM      Result Value Ref Range   Lactic Acid, Venous 0.99  0.5 - 2.2 mmol/L  CBC WITH DIFFERENTIAL     Status: Abnormal   Collection Time    12/02/13  5:45 AM      Result Value Ref Range   WBC 17.2 (*) 4.0 - 10.5 K/uL   RBC 4.16 (*) 4.22 - 5.81 MIL/uL   Hemoglobin 11.6 (*) 13.0 - 17.0 g/dL   HCT 35.1 (*) 39.0 - 52.0 %  MCV 84.4  78.0 - 100.0 fL   MCH 27.9  26.0 - 34.0 pg   MCHC 33.0  30.0 - 36.0 g/dL   RDW 16.3 (*) 11.5 - 15.5 %   Platelets 193  150 - 400 K/uL   Neutrophils Relative % 87 (*) 43 - 77 %   Neutro Abs 15.0 (*) 1.7 - 7.7 K/uL   Lymphocytes Relative 5 (*) 12 - 46 %   Lymphs Abs 0.8  0.7 - 4.0 K/uL   Monocytes Relative 8  3 - 12 %   Monocytes Absolute 1.4 (*) 0.1 - 1.0 K/uL   Eosinophils Relative 0  0 - 5 %   Eosinophils Absolute 0.0  0.0 - 0.7 K/uL   Basophils Relative 0  0 - 1 %   Basophils Absolute 0.0  0.0 - 0.1 K/uL  BASIC METABOLIC  PANEL     Status: Abnormal   Collection Time    12/02/13  5:45 AM      Result Value Ref Range   Sodium 138  137 - 147 mEq/L   Potassium 3.9  3.7 - 5.3 mEq/L   Chloride 97  96 - 112 mEq/L   CO2 27  19 - 32 mEq/L   Glucose, Bld 212 (*) 70 - 99 mg/dL   BUN 18  6 - 23 mg/dL   Creatinine, Ser 1.02  0.50 - 1.35 mg/dL   Calcium 8.5  8.4 - 10.5 mg/dL   GFR calc non Af Amer 72 (*) >90 mL/min   GFR calc Af Amer 83 (*) >90 mL/min   Comment: (NOTE)     The eGFR has been calculated using the CKD EPI equation.     This calculation has not been validated in all clinical situations.     eGFR's persistently <90 mL/min signify possible Chronic Kidney     Disease.  URINALYSIS, ROUTINE W REFLEX MICROSCOPIC     Status: Abnormal   Collection Time    12/02/13  6:11 AM      Result Value Ref Range   Color, Urine YELLOW  YELLOW   APPearance CLOUDY (*) CLEAR   Specific Gravity, Urine 1.021  1.005 - 1.030   pH 6.0  5.0 - 8.0   Glucose, UA 100 (*) NEGATIVE mg/dL   Hgb urine dipstick TRACE (*) NEGATIVE   Bilirubin Urine NEGATIVE  NEGATIVE   Ketones, ur NEGATIVE  NEGATIVE mg/dL   Protein, ur 30 (*) NEGATIVE mg/dL   Urobilinogen, UA 0.2  0.0 - 1.0 mg/dL   Nitrite NEGATIVE  NEGATIVE   Leukocytes, UA MODERATE (*) NEGATIVE  URINE MICROSCOPIC-ADD ON     Status: None   Collection Time    12/02/13  6:11 AM      Result Value Ref Range   WBC, UA 21-50  <3 WBC/hpf   RBC / HPF 3-6  <3 RBC/hpf   Bacteria, UA RARE  RARE   Urine-Other MUCOUS PRESENT    GLUCOSE, CAPILLARY     Status: Abnormal   Collection Time    12/02/13  8:08 AM      Result Value Ref Range   Glucose-Capillary 165 (*) 70 - 99 mg/dL   No results found for this or any previous visit (from the past 240 hour(s)). Creatinine:  Recent Labs  12/02/13 0545  CREATININE 1.02    Xrays: I independently reviewed the ultrasound and the findings are as noted in the HPI  Impression/Assessment:  Bilateral epididymitis.  Left hydrocele.  I do not  think he has testicular or epididymal abscess.    Plan:  Continue IV antibiotics.  Ice packs to scrotum.  Keep scrotum elevated on a hand towel.  Scrotal support. Daniel Daniel Morrow will follow patient.  Daniel Morrow 12/02/2013, 11:01 AM    CC: Daniel Daniel Morrow

## 2013-12-02 NOTE — Progress Notes (Signed)
Daniel Morrow [id number] Code Status: not on file  Admission Data: 12/02/2013 8:47 AM Attending Provider:  Joseph  NWG:NFAOZHY,QMVHQPCP:OSBORNE,JAMES CHARLES, MD Consults/ Treatment Team: Internal Medicine   Daniel Morrow is a 73 y.o. male patient admitted from ED awake, alert - oriented  X 3 - no acute distress noted.  VSS - Blood pressure 122/66, pulse 87, temperature 98.5 F (36.9 C), temperature source Oral, resp. rate 18, height 5\' 9"  (1.753 m), weight 85.684 kg (188 lb 14.4 oz), SpO2 93.00%.  no c/o shortness of breath, no c/o chest pain.  O2:   Room aire IV Fluids:  IV in place, occlusive dsg intact without redness, IV cath forearm right, condition patent and no redness normal saline.  Allergies:  No Known Allergies   Past Medical History  Diagnosis Date  . Hypertension   . Diabetes mellitus   . Hyperlipidemia    Medications Prior to Admission  Medication Sig Dispense Refill  . AGGRENOX 25-200 MG per 12 hr capsule Take 1 capsule by mouth 2 (two) times daily.      Marland Kitchen. atorvastatin (LIPITOR) 40 MG tablet Take 40 mg by mouth daily.      . benazepril (LOTENSIN) 10 MG tablet Take 10 mg by mouth daily.      . hydrochlorothiazide (HYDRODIURIL) 25 MG tablet Take 25 mg by mouth daily.      . Multiple Vitamin (MULTIVITAMIN WITH MINERALS) TABS tablet Take 1 tablet by mouth daily.      . pantoprazole (PROTONIX) 40 MG tablet Take 40 mg by mouth daily.      . repaglinide (PRANDIN) 0.5 MG tablet Take 0.5 mg by mouth 3 (three) times daily before meals.      . sitaGLIPtan-metformin (JANUMET) 50-1000 MG per tablet Take 1 tablet by mouth 2 (two) times daily with a meal.       History:  obtained from the patient. Tobacco/alcohol: denied none  Orientation to room, and floor completed with information packet given to patient/family.  Patient declined safety video at this time.  Admission INP armband ID verified with patient/family, and in place.   SR up x 2, fall assessment complete, with patient and family  able to verbalize understanding of risk associated with falls, and verbalized understanding to call nsg before up out of bed.  Call light within reach, patient able to voice, and demonstrate understanding. No evidence of pressure sores. Testicles swollen bilaterally.  No evidence of skin break down noted on exam.     Will cont to eval and treat per MD orders.  Aldean AstLEsperance, Litzi Binning C, RN 12/02/2013 8:47 AM

## 2013-12-02 NOTE — ED Notes (Signed)
Attempted report 

## 2013-12-02 NOTE — ED Provider Notes (Signed)
CSN: [id number]     Arrival date & time 12/01/13  2320 History   First MD Initiated Contact with Patient 12/02/13 [phone]     Chief Complaint  Patient presents with  . Testicle Pain     (Consider location/radiation/quality/duration/timing/severity/associated sxs/prior Treatment) HPI 73 year old male presents to the emergency department with one-day history of bilateral testicle swelling and pain.  Left greater than right.  Patient finished up a course of antibiotics for testicle infection two weeks ago.  Patient developed symptoms roughly a month ago, but not as severe as today.  Patient has had fevers to 103 over the last 24 hours and has felt weak and limp.  Patient has history of epididymitis in 2012.  Antibiotic bottles show patient initially on Augmentin and switched to Bactrim.  Patient has followup scheduled with his urologist in April.     Past Medical History  Diagnosis Date  . Hypertension   . Diabetes mellitus   . Hyperlipidemia    History reviewed. No pertinent past surgical history. History reviewed. No pertinent family history. History  Substance Use Topics  . Smoking status: Never Smoker   . Smokeless tobacco: Never Used  . Alcohol Use: No    Review of Systems   See History of Present Illness; otherwise all other systems are reviewed and negative  Allergies  Review of patient's allergies indicates no known allergies.  Home Medications   Current Outpatient Rx  Name  Route  Sig  Dispense  Refill  . AGGRENOX 25-200 MG per 12 hr capsule   Oral   Take 1 capsule by mouth 2 (two) times daily.         Marland Kitchen. atorvastatin (LIPITOR) 40 MG tablet   Oral   Take 40 mg by mouth daily.         . benazepril (LOTENSIN) 10 MG tablet   Oral   Take 10 mg by mouth daily.         . hydrochlorothiazide (HYDRODIURIL) 25 MG tablet   Oral   Take 25 mg by mouth daily.         . Multiple Vitamin (MULTIVITAMIN WITH MINERALS) TABS tablet   Oral   Take 1 tablet by mouth  daily.         . pantoprazole (PROTONIX) 40 MG tablet   Oral   Take 40 mg by mouth daily.         . repaglinide (PRANDIN) 0.5 MG tablet   Oral   Take 0.5 mg by mouth 3 (three) times daily before meals.         . sitaGLIPtan-metformin (JANUMET) 50-1000 MG per tablet   Oral   Take 1 tablet by mouth 2 (two) times daily with a meal.          BP 119/58  Pulse 94  Temp(Src) 99.1 F (37.3 C) (Oral)  Resp 16  Wt 188 lb (85.276 kg)  SpO2 94% Physical Exam  Nursing note and vitals reviewed. Constitutional: He is oriented to person, place, and time. He appears well-developed and well-nourished.  HENT:  Head: Normocephalic and atraumatic.  Nose: Nose normal.  Mouth/Throat: Oropharynx is clear and moist.  Eyes: Conjunctivae and EOM are normal. Pupils are equal, round, and reactive to light.  Neck: Normal range of motion. Neck supple. No JVD present. No tracheal deviation present. No thyromegaly present.  Cardiovascular: Normal rate, regular rhythm, normal heart sounds and intact distal pulses.  Exam reveals no gallop and no friction rub.   No murmur  heard. Pulmonary/Chest: Effort normal and breath sounds normal. No stridor. No respiratory distress. He has no wheezes. He has no rales. He exhibits no tenderness.  Abdominal: Soft. Bowel sounds are normal. He exhibits no distension and no mass. There is no tenderness. There is no rebound and no guarding.  Genitourinary:  Patient has bilateral swelling to testicles, left greater than right.  Patient does not tolerate testicular exam secondary to pain.  There is erythema over lying the scrotal sac without fluctuance, skin changes, aside from erythema  Musculoskeletal: Normal range of motion. He exhibits no edema and no tenderness.  Lymphadenopathy:    He has no cervical adenopathy.  Neurological: He is alert and oriented to person, place, and time. He exhibits normal muscle tone. Coordination normal.  Skin: Skin is warm and dry. No rash  noted. No erythema. No pallor.  Psychiatric: He has a normal mood and affect. His behavior is normal. Judgment and thought content normal.    ED Course  Procedures (including critical care time) Labs Review Labs Reviewed  URINE CULTURE  CULTURE, BLOOD (ROUTINE X 2)  CULTURE, BLOOD (ROUTINE X 2)  URINALYSIS, ROUTINE W REFLEX MICROSCOPIC  CBC WITH DIFFERENTIAL  BASIC METABOLIC PANEL  I-STAT CG4 LACTIC ACID, ED   Imaging Review US Scrotum  12/02/2013   CLINICAL DATA:  Bilateral scrotal pain  EXAM: SCROTAL ULTRASOUND  DOPPLER ULTRASOUND OF THE TESTICLES  TECHNIQUE: Complete ultrasound examination of the testicles, epididymis, and other scrotal structures was performed. Color and spectral Doppler ultrasound were also utilized to evaluate blood flow to the testicles.  COMPARISON:  06/30/2011, 11/08/2013  FINDINGS: Right testicle  Measurements: 4.5 x 2.2 x 2.1 cm. No mass or microlithiasis visualized.  Left testicle  Measurements: 5.3 x 3.7 x 4.1 cm. No mass or microlithiasis visualized.  Right epididymis: Enlarged, heterogeneous in echogenicity. Increased vascularity  Left epididymis: Enlarged, heterogeneous in echogenicity. Increased vascularity.  Hydrocele:  Small left hydrocele with septations.  Varicocele:  Bilateral varicoceles.  Pulsed Doppler interrogation of both testes demonstrates low resistance arterial and venous waveforms bilaterally. Bilateral testicles appear hypervascular.  IMPRESSION: Bilateral testicle and epididymis hypervascularity may reflect epididymo-orchitis.  Small complex left hydrocele for which hematocele or pyocele is not excluded. The appearance is similar in complexity however smaller than the prior.   Electronically Signed   By: Jearld Lesch M.D.   On: 12/02/2013 05:35   [place] Art/ven Flow Abd Pelv Doppler  12/02/2013   CLINICAL DATA:  Bilateral scrotal pain  EXAM: SCROTAL ULTRASOUND  DOPPLER ULTRASOUND OF THE TESTICLES  TECHNIQUE: Complete ultrasound examination of  the testicles, epididymis, and other scrotal structures was performed. Color and spectral Doppler ultrasound were also utilized to evaluate blood flow to the testicles.  COMPARISON:  06/30/2011, 11/08/2013  FINDINGS: Right testicle  Measurements: 4.5 x 2.2 x 2.1 cm. No mass or microlithiasis visualized.  Left testicle  Measurements: 5.3 x 3.7 x 4.1 cm. No mass or microlithiasis visualized.  Right epididymis: Enlarged, heterogeneous in echogenicity. Increased vascularity  Left epididymis: Enlarged, heterogeneous in echogenicity. Increased vascularity.  Hydrocele:  Small left hydrocele with septations.  Varicocele:  Bilateral varicoceles.  Pulsed Doppler interrogation of both testes demonstrates low resistance arterial and venous waveforms bilaterally. Bilateral testicles appear hypervascular.  IMPRESSION: Bilateral testicle and epididymis hypervascularity may reflect epididymo-orchitis.  Small complex left hydrocele for which hematocele or pyocele is not excluded. The appearance is similar in complexity however smaller than the prior.   Electronically Signed   By: Lerry Liner.D.  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  Fevers.  Concern for systemic infection.  Blood cultures, and lactate, sent. Given history of diabetes and failing outpatient therapy, we'll contact urology for their input, but feel patient may need inpatient stay with IV antibiotics.  D/w Dr Brunilda Payor, who will inform Dr Patsi Sears of pt's admission, and will consult.    Olivia Mackie, MD 12/02/13 [phone]

## 2013-12-02 NOTE — ED Notes (Signed)
Pt remains in pain, pt told to call out if more medication needed. EDP aware medications ordered.

## 2013-12-02 NOTE — H&P (Signed)
[facility] History and Physical  Daniel Morrow WGN:[id number] DOB: 09/20/1941 DOA: 12/02/2013  Referring physician: EDP PCP: Darnelle BosSBORNE,JAMES CHARLES, MD   Chief Complaint: testicular pain and swelling  HPI: Daniel Morrow is a 73 y.o. male with PMH opf DM, CVA, HTN, h/o epididymitis in 2012, was treated by Dr.Tannenbaum 1 month back with a course of BActrim following augmentin for smilar testicular pain and swelling. He improved from that standpoint. Yesterday he noticed chills, followed by temp of 103 and then scrotal swelling and pain and subsequently presented to the ER. In ER, WBC 17K, UA abnormal and scrotal US -concerning for  epididymo-orchitis.  Small complex left hydrocele: hematocele or pyocele is not excluded.    Review of Systems:  Constitutional:  No weight loss, night sweats, Fevers, chills, fatigue.  HEENT:  No headaches, Difficulty swallowing,Tooth/dental problems,Sore throat,  No sneezing, itching, ear ache, nasal congestion, post nasal drip,  Cardio-vascular:  No chest pain, Orthopnea, PND, swelling in lower extremities, anasarca, dizziness, palpitations  GI:  No heartburn, indigestion, abdominal pain, nausea, vomiting, diarrhea, change in bowel habits, loss of appetite  Resp:  No shortness of breath with exertion or at rest. No excess mucus, no productive cough, No non-productive cough, No coughing up of blood.No change in color of mucus.No wheezing.No chest wall deformity  Skin:  no rash or lesions.  GU:  no dysuria, change in color of urine, no urgency or frequency. No flank pain.  Musculoskeletal:  No joint pain or swelling. No decreased range of motion. No back pain.  Psych:  No change in mood or affect. No depression or anxiety. No memory loss.   Past Medical History  Diagnosis Date  . Hypertension   . Diabetes mellitus   . Hyperlipidemia    History reviewed. No pertinent past surgical history. Social History:  reports that he has never  smoked. He has never used smokeless tobacco. He reports that he does not drink alcohol or use illicit drugs.  No Known Allergies  History reviewed. No pertinent family history.   Prior to Admission medications   Medication Sig Start Date End Date Taking? Authorizing Provider  AGGRENOX 25-200 MG per 12 hr capsule Take 1 capsule by mouth 2 (two) times daily. 05/06/13  Yes Historical Provider, MD  atorvastatin (LIPITOR) 40 MG tablet Take 40 mg by mouth daily.   Yes Historical Provider, MD  benazepril (LOTENSIN) 10 MG tablet Take 10 mg by mouth daily.   Yes Historical Provider, MD  hydrochlorothiazide (HYDRODIURIL) 25 MG tablet Take 25 mg by mouth daily.   Yes Historical Provider, MD  Multiple Vitamin (MULTIVITAMIN WITH MINERALS) TABS tablet Take 1 tablet by mouth daily.   Yes Historical Provider, MD  pantoprazole (PROTONIX) 40 MG tablet Take 40 mg by mouth daily.   Yes Historical Provider, MD  repaglinide (PRANDIN) 0.5 MG tablet Take 0.5 mg by mouth 3 (three) times daily before meals.   Yes Historical Provider, MD  sitaGLIPtan-metformin (JANUMET) 50-1000 MG per tablet Take 1 tablet by mouth 2 (two) times daily with a meal.   Yes Historical Provider, MD   Physical Exam: Filed Vitals:   12/02/13 1300  BP: 117/65  Pulse: 86  Temp: 98.2 F (36.8 C)  Resp: 18    BP 117/65  Pulse 86  Temp(Src) 98.2 F (36.8 C) (Oral)  Resp 18  Ht 5\' 9"  (1.753 m)  Wt 85.684 kg (188 lb 14.4 oz)  BMI 27.88 kg/m2  SpO2 92%  General:  Appears calm and  comfortable Eyes: PERRL, normal lids, irises & conjunctiva ENT: grossly normal hearing, lips & tongue Neck: no LAD, masses or thyromegaly Cardiovascular: RRR, no m/r/g. No LE edema. Telemetry: SR, no arrhythmias  Respiratory: CTA bilaterally, no w/r/r. Normal respiratory effort. Abdomen: soft, Nt, ND, BS present GU: Scrotal swelling, erythema, L testis more swollen,  with erythema and tenderness along vas deferens Skin: no rash or induration seen on  limited exam Musculoskeletal: grossly normal tone BUE/BLE Psychiatric: grossly normal mood and affect, speech fluent and appropriate Neurologic: grossly non-focal.          Labs on Admission:  Basic Metabolic Panel:  Recent Labs Lab 12/02/13 0545  NA 138  K 3.9  CL 97  CO2 27  GLUCOSE 212*  BUN 18  CREATININE 1.02  CALCIUM 8.5   Liver Function Tests: No results found for this basename: AST, ALT, ALKPHOS, BILITOT, PROT, ALBUMIN,  in the last 168 hours No results found for this basename: LIPASE, AMYLASE,  in the last 168 hours No results found for this basename: AMMONIA,  in the last 168 hours CBC:  Recent Labs Lab 12/02/13 0545  WBC 17.2*  NEUTROABS 15.0*  HGB 11.6*  HCT 35.1*  MCV 84.4  PLT 193   Cardiac Enzymes: No results found for this basename: CKTOTAL, CKMB, CKMBINDEX, TROPONINI,  in the last 168 hours  BNP (last 3 results) No results found for this basename: PROBNP,  in the last 8760 hours CBG:  Recent Labs Lab 12/02/13 0808 12/02/13 1137  GLUCAP 165* 269*    Radiological Exams on Admission: US Scrotum  12/02/2013   CLINICAL DATA:  Bilateral scrotal pain  EXAM: SCROTAL ULTRASOUND  DOPPLER ULTRASOUND OF THE TESTICLES  TECHNIQUE: Complete ultrasound examination of the testicles, epididymis, and other scrotal structures was performed. Color and spectral Doppler ultrasound were also utilized to evaluate blood flow to the testicles.  COMPARISON:  06/30/2011, 11/08/2013  FINDINGS: Right testicle  Measurements: 4.5 x 2.2 x 2.1 cm. No mass or microlithiasis visualized.  Left testicle  Measurements: 5.3 x 3.7 x 4.1 cm. No mass or microlithiasis visualized.  Right epididymis: Enlarged, heterogeneous in echogenicity. Increased vascularity  Left epididymis: Enlarged, heterogeneous in echogenicity. Increased vascularity.  Hydrocele:  Small left hydrocele with septations.  Varicocele:  Bilateral varicoceles.  Pulsed Doppler interrogation of both testes demonstrates low  resistance arterial and venous waveforms bilaterally. Bilateral testicles appear hypervascular.  IMPRESSION: Bilateral testicle and epididymis hypervascularity may reflect epididymo-orchitis.  Small complex left hydrocele for which hematocele or pyocele is not excluded. The appearance is similar in complexity however smaller than the prior.   Electronically Signed   By: Jearld Lesch M.D.   On: 12/02/2013 05:35   [place] Art/ven Flow Abd Pelv Doppler  12/02/2013   CLINICAL DATA:  Bilateral scrotal pain  EXAM: SCROTAL ULTRASOUND  DOPPLER ULTRASOUND OF THE TESTICLES  TECHNIQUE: Complete ultrasound examination of the testicles, epididymis, and other scrotal structures was performed. Color and spectral Doppler ultrasound were also utilized to evaluate blood flow to the testicles.  COMPARISON:  06/30/2011, 11/08/2013  FINDINGS: Right testicle  Measurements: 4.5 x 2.2 x 2.1 cm. No mass or microlithiasis visualized.  Left testicle  Measurements: 5.3 x 3.7 x 4.1 cm. No mass or microlithiasis visualized.  Right epididymis: Enlarged, heterogeneous in echogenicity. Increased vascularity  Left epididymis: Enlarged, heterogeneous in echogenicity. Increased vascularity.  Hydrocele:  Small left hydrocele with septations.  Varicocele:  Bilateral varicoceles.  Pulsed Doppler interrogation of both testes demonstrates low resistance arterial and  venous waveforms bilaterally. Bilateral testicles appear hypervascular.  IMPRESSION: Bilateral testicle and epididymis hypervascularity may reflect epididymo-orchitis.  Small complex left hydrocele for which hematocele or pyocele is not excluded. The appearance is similar in complexity however smaller than the prior.   Electronically Signed   By: Jearld Lesch M.D.   On: 12/02/2013 05:35     Assessment/Plan  Epididymo-orchitis -Start IV Zosyn -Fu Urine Cx -Elevate scrotum -Urology following  DM -hold oral agents, SSI -check hbaic  Hypertension -continue ACE, hold  HCTZ  H/o CVA -continue aggrenox, statin  DVt proph: lovenox  Code Status: DNR Family Communication: d/w pt and wife at bedside Disposition Plan: home when improved  Time spent:  [facility] Pager [phone]

## 2013-12-02 NOTE — Progress Notes (Signed)
7:41 AM Report received from ED RN

## 2013-12-02 NOTE — Progress Notes (Signed)
ANTIBIOTIC CONSULT NOTE - INITIAL  Pharmacy Consult for Zosyn Indication: epididimo-orchitis   No Known Allergies  Patient Measurements: Height: 5\' 9"  (175.3 cm) Weight: 188 lb 14.4 oz (85.684 kg) IBW/kg (Calculated) : 70.7   Vital Signs: Temp: 98.5 F (36.9 C) (03/13 14780812) Temp src: Oral (03/13 0812) BP: 122/66 mmHg (03/13 0812) Pulse Rate: 87 (03/13 0812) Intake/Output from previous day:   Intake/Output from this shift: Total I/O In: 15 [Other:15] Out: 200 [Urine:200]  Labs:  Recent Labs  12/02/13 0545  WBC 17.2*  HGB 11.6*  PLT 193  CREATININE 1.02   Estimated Creatinine Clearance: 72.1 ml/min (by C-G formula based on Cr of 1.02). No results found for this basename: VANCOTROUGH, VANCOPEAK, VANCORANDOM, GENTTROUGH, GENTPEAK, GENTRANDOM, TOBRATROUGH, TOBRAPEAK, TOBRARND, AMIKACINPEAK, AMIKACINTROU, AMIKACIN,  in the last 72 hours   Microbiology: No results found for this or any previous visit (from the past 720 hour(s)).  Medical History: Past Medical History  Diagnosis Date  . Hypertension   . Diabetes mellitus   . Hyperlipidemia     Medications:  Prescriptions prior to admission  Medication Sig Dispense Refill  . AGGRENOX 25-200 MG per 12 hr capsule Take 1 capsule by mouth 2 (two) times daily.      Marland Kitchen. atorvastatin (LIPITOR) 40 MG tablet Take 40 mg by mouth daily.      . benazepril (LOTENSIN) 10 MG tablet Take 10 mg by mouth daily.      . hydrochlorothiazide (HYDRODIURIL) 25 MG tablet Take 25 mg by mouth daily.      . Multiple Vitamin (MULTIVITAMIN WITH MINERALS) TABS tablet Take 1 tablet by mouth daily.      . pantoprazole (PROTONIX) 40 MG tablet Take 40 mg by mouth daily.      . repaglinide (PRANDIN) 0.5 MG tablet Take 0.5 mg by mouth 3 (three) times daily before meals.      . sitaGLIPtan-metformin (JANUMET) 50-1000 MG per tablet Take 1 tablet by mouth 2 (two) times daily with a meal.       Scheduled:  . atorvastatin  40 mg Oral Daily  . benazepril   10 mg Oral Daily  . dipyridamole-aspirin  1 capsule Oral BID  . enoxaparin (LOVENOX) injection  40 mg Subcutaneous Q24H  . multivitamin with minerals  1 tablet Oral Daily  . pantoprazole  40 mg Oral Daily  . piperacillin-tazobactam (ZOSYN)  IV  3.375 g Intravenous 3 times per day   Infusions:  . sodium chloride     Assessment: 73 y.o male presents to the emergency department with one-day history of bilateral testicle swelling and pain.  He has received ceftriaxone and ciprofloxacin.  Now to start on IV Zosyn for epididimo-orchitis. SCr = 1.02, CrCl ~ 72 ml/min  Goal of Therapy:  Resolution of infection  Plan:  Zosyn 3.375 gm IV q8hr (infusion over 4 hours) Monitor kidney function, WBC, fever curve, any cultures, and clinical progression.   Daniel Morrow, RPh Clinical Pharmacist Pager: [phone] 12/02/2013,10:21 AM

## 2013-12-03 LAB — BASIC METABOLIC PANEL
BUN: 14 mg/dL (ref 6–23)
CO2: 26 mEq/L (ref 19–32)
Calcium: 8.5 mg/dL (ref 8.4–10.5)
Chloride: 101 mEq/L (ref 96–112)
Creatinine, Ser: 0.98 mg/dL (ref 0.50–1.35)
GFR calc Af Amer: >90 mL/min (ref >90)
GFR calc non Af Amer: 81 mL/min — ABNORMAL LOW (ref >90)
GLUCOSE: 153 mg/dL — AB (ref 70–99)
Potassium: 3.8 mEq/L (ref 3.7–5.3)
Sodium: 142 mEq/L (ref 137–147)

## 2013-12-03 LAB — CBC
HEMATOCRIT: 34.4 % — AB (ref 39.0–52.0)
HEMOGLOBIN: 11.1 g/dL — AB (ref 13.0–17.0)
MCH: 27.8 pg (ref 26.0–34.0)
MCHC: 32.3 g/dL (ref 30.0–36.0)
MCV: 86.2 fL (ref 78.0–100.0)
Platelets: 186 10*3/uL (ref 150–400)
RBC: 3.99 MIL/uL — ABNORMAL LOW (ref 4.22–5.81)
RDW: 16.3 % — ABNORMAL HIGH (ref 11.5–15.5)
WBC: 11.5 10*3/uL — ABNORMAL HIGH (ref 4.0–10.5)

## 2013-12-03 LAB — URINE CULTURE

## 2013-12-03 LAB — GLUCOSE, CAPILLARY
GLUCOSE-CAPILLARY: 199 mg/dL — AB (ref 70–99)
GLUCOSE-CAPILLARY: 208 mg/dL — AB (ref 70–99)
Glucose-Capillary: 146 mg/dL — ABNORMAL HIGH (ref 70–99)
Glucose-Capillary: 182 mg/dL — ABNORMAL HIGH (ref 70–99)

## 2013-12-03 NOTE — Progress Notes (Addendum)
RN kept pt scrotum elevated with a roll of towel and applied cold compress intermittently.

## 2013-12-03 NOTE — Progress Notes (Signed)
Subjective: Patient feels better, testicular pain improved. Size of scrotum  much smaller.  Objective: Vital signs in last 24 hours: Temp:  [98.1 F (36.7 C)-98.9 F (37.2 C)] 98.9 F (37.2 C) (03/14 0604) Pulse Rate:  [80-85] 85 (03/14 0604) Resp:  [16] 16 (03/14 0604) BP: (130-133)/(69-74) 133/74 mmHg (03/14 0604) SpO2:  [90 %-94 %] 90 % (03/14 0604) Weight change: 0.408 kg (14.4 oz) Last BM Date: 12/01/13  Intake/Output from previous day: 03/13 0701 - 03/14 0700 In: 477 [P.O.:462] Out: 825 [Urine:825] Intake/Output this shift: Total I/O In: 297 [P.O.:222; I.V.:75] Out: -   General appearance: alert and cooperative Resp: clear to auscultation bilaterally GI: soft, non-tender; bowel sounds normal; no masses,  no organomegaly Male genitalia: Testicular erythema, tenderness, no warmth appreciated, limited exam because of pain and recent exam by urologist  Lab Results:  Results for orders placed during the hospital encounter of 12/02/13 (from the past 24 hour(s))  GLUCOSE, CAPILLARY     Status: Abnormal   Collection Time    12/02/13  4:53 PM      Result Value Ref Range   Glucose-Capillary 157 (*) 70 - 99 mg/dL  GLUCOSE, CAPILLARY     Status: Abnormal   Collection Time    12/02/13  9:14 PM      Result Value Ref Range   Glucose-Capillary 219 (*) 70 - 99 mg/dL   Comment 1 Documented in Chart     Comment 2 Notify RN    CBC     Status: Abnormal   Collection Time    12/03/13  6:45 AM      Result Value Ref Range   WBC 11.5 (*) 4.0 - 10.5 K/uL   RBC 3.99 (*) 4.22 - 5.81 MIL/uL   Hemoglobin 11.1 (*) 13.0 - 17.0 g/dL   HCT 16.134.4 (*) 09.639.0 - 04.552.0 %   MCV 86.2  78.0 - 100.0 fL   MCH 27.8  26.0 - 34.0 pg   MCHC 32.3  30.0 - 36.0 g/dL   RDW 40.916.3 (*) 81.111.5 - 91.415.5 %   Platelets 186  150 - 400 K/uL  BASIC METABOLIC PANEL     Status: Abnormal   Collection Time    12/03/13  6:45 AM      Result Value Ref Range   Sodium 142  137 - 147 mEq/L   Potassium 3.8  3.7 - 5.3 mEq/L    Chloride 101  96 - 112 mEq/L   CO2 26  19 - 32 mEq/L   Glucose, Bld 153 (*) 70 - 99 mg/dL   BUN 14  6 - 23 mg/dL   Creatinine, Ser 7.820.98  0.50 - 1.35 mg/dL   Calcium 8.5  8.4 - 95.610.5 mg/dL   GFR calc non Af Amer 81 (*) >90 mL/min   GFR calc Af Amer >90  >90 mL/min  GLUCOSE, CAPILLARY     Status: Abnormal   Collection Time    12/03/13  7:51 AM      Result Value Ref Range   Glucose-Capillary 146 (*) 70 - 99 mg/dL  GLUCOSE, CAPILLARY     Status: Abnormal   Collection Time    12/03/13 11:47 AM      Result Value Ref Range   Glucose-Capillary 182 (*) 70 - 99 mg/dL      Studies/Results: [place] Scrotum  12/02/2013   CLINICAL DATA:  Bilateral scrotal pain  EXAM: SCROTAL ULTRASOUND  DOPPLER ULTRASOUND OF THE TESTICLES  TECHNIQUE: Complete ultrasound examination of the testicles,  epididymis, and other scrotal structures was performed. Color and spectral Doppler ultrasound were also utilized to evaluate blood flow to the testicles.  COMPARISON:  06/30/2011, 11/08/2013  FINDINGS: Right testicle  Measurements: 4.5 x 2.2 x 2.1 cm. No mass or microlithiasis visualized.  Left testicle  Measurements: 5.3 x 3.7 x 4.1 cm. No mass or microlithiasis visualized.  Right epididymis: Enlarged, heterogeneous in echogenicity. Increased vascularity  Left epididymis: Enlarged, heterogeneous in echogenicity. Increased vascularity.  Hydrocele:  Small left hydrocele with septations.  Varicocele:  Bilateral varicoceles.  Pulsed Doppler interrogation of both testes demonstrates low resistance arterial and venous waveforms bilaterally. Bilateral testicles appear hypervascular.  IMPRESSION: Bilateral testicle and epididymis hypervascularity may reflect epididymo-orchitis.  Small complex left hydrocele for which hematocele or pyocele is not excluded. The appearance is similar in complexity however smaller than the prior.   Electronically Signed   By: Jearld Lesch M.D.   On: 12/02/2013 05:35   [place] Art/ven Flow Abd Pelv  Doppler  12/02/2013   CLINICAL DATA:  Bilateral scrotal pain  EXAM: SCROTAL ULTRASOUND  DOPPLER ULTRASOUND OF THE TESTICLES  TECHNIQUE: Complete ultrasound examination of the testicles, epididymis, and other scrotal structures was performed. Color and spectral Doppler ultrasound were also utilized to evaluate blood flow to the testicles.  COMPARISON:  06/30/2011, 11/08/2013  FINDINGS: Right testicle  Measurements: 4.5 x 2.2 x 2.1 cm. No mass or microlithiasis visualized.  Left testicle  Measurements: 5.3 x 3.7 x 4.1 cm. No mass or microlithiasis visualized.  Right epididymis: Enlarged, heterogeneous in echogenicity. Increased vascularity  Left epididymis: Enlarged, heterogeneous in echogenicity. Increased vascularity.  Hydrocele:  Small left hydrocele with septations.  Varicocele:  Bilateral varicoceles.  Pulsed Doppler interrogation of both testes demonstrates low resistance arterial and venous waveforms bilaterally. Bilateral testicles appear hypervascular.  IMPRESSION: Bilateral testicle and epididymis hypervascularity may reflect epididymo-orchitis.  Small complex left hydrocele for which hematocele or pyocele is not excluded. The appearance is similar in complexity however smaller than the prior.   Electronically Signed   By: Jearld Lesch M.D.   On: 12/02/2013 05:35    Medications:  Prior to Admission:  Prescriptions prior to admission  Medication Sig Dispense Refill  . AGGRENOX 25-200 MG per 12 hr capsule Take 1 capsule by mouth 2 (two) times daily.      Marland Kitchen atorvastatin (LIPITOR) 40 MG tablet Take 40 mg by mouth daily.      . benazepril (LOTENSIN) 10 MG tablet Take 10 mg by mouth daily.      . hydrochlorothiazide (HYDRODIURIL) 25 MG tablet Take 25 mg by mouth daily.      . Multiple Vitamin (MULTIVITAMIN WITH MINERALS) TABS tablet Take 1 tablet by mouth daily.      . pantoprazole (PROTONIX) 40 MG tablet Take 40 mg by mouth daily.      . repaglinide (PRANDIN) 0.5 MG tablet Take 0.5 mg by mouth 3  (three) times daily before meals.      . sitaGLIPtan-metformin (JANUMET) 50-1000 MG per tablet Take 1 tablet by mouth 2 (two) times daily with a meal.       Scheduled: . atorvastatin  40 mg Oral Daily  . benazepril  10 mg Oral Daily  . dipyridamole-aspirin  1 capsule Oral BID  . enoxaparin (LOVENOX) injection  40 mg Subcutaneous Q24H  . insulin aspart  0-9 Units Subcutaneous TID WC  . multivitamin with minerals  1 tablet Oral Daily  . pantoprazole  40 mg Oral Daily  . piperacillin-tazobactam (ZOSYN)  IV  3.375 g Intravenous 3 times per day   Continuous: . sodium chloride 75 mL/hr at 12/03/13 0046   ZOX:[id number], HYDROcodone-acetaminophen, ondansetron (ZOFRAN) IV  Assessment/Plan: Principal Problem:   Epididymo-orchitis, patient appears to be responding to IV antibiotics. There is reduce pain, decreased leukocytosis. Case discussed with urologist, for now continue IV antibiotics, he states he will review outpatient records/cultures, hopefully plans for discharge to home tomorrow. Active Problems:   Hypertension   CVA (cerebral infarction) Diabetes, resume oral medications as clinical course allows.    LOS: 1 day   Zan Triska D 12/03/2013, 1:21 PM

## 2013-12-03 NOTE — Progress Notes (Signed)
Subjective: Patient reports feeling better. His pain has diminished. He is having no voiding concerns. He remains afebrile. White blood cell count is also improved. The patient apparently has had previous episodes of UTI/prostatitis. He was on several courses of antibiotics in mid February of this year.  Objective: Vital signs in last 24 hours: Temp:  [98.1 F (36.7 C)-98.9 F (37.2 C)] 98.9 F (37.2 C) (03/14 0604) Pulse Rate:  [80-85] 85 (03/14 0604) Resp:  [16] 16 (03/14 0604) BP: (130-133)/(69-74) 133/74 mmHg (03/14 0604) SpO2:  [90 %-94 %] 90 % (03/14 0604)  Intake/Output from previous day: 03/13 0701 - 03/14 0700 In: 477 [P.O.:462] Out: 825 [Urine:825] Intake/Output this shift: Total I/O In: 297 [P.O.:222; I.V.:75] Out: -   Physical Exam:  Constitutional: Vital signs reviewed. WD WN in NAD   Eyes: PERRL, No scleral icterus.   Cardiovascular: RRR Pulmonary/Chest: Normal effort Abdominal: Soft. Non-tender, non-distended, bowel sounds are normal, no masses, organomegaly, or guarding present.  Genitourinary: Bilateral testicular/epididymal induration left greater than right. No evidence of abscess. No evidence of cellulitis or necrotizing process. Sam is consistent with bilateral epididymitis. Extremities: No cyanosis or edema   Lab Results:  Recent Labs  12/02/13 0545 12/03/13 0645  HGB 11.6* 11.1*  HCT 35.1* 34.4*   BMET  Recent Labs  12/02/13 0545 12/03/13 0645  NA 138 142  K 3.9 3.8  CL 97 101  CO2 27 26  GLUCOSE 212* 153*  BUN 18 14  CREATININE 1.02 0.98  CALCIUM 8.5 8.5   No results found for this basename: LABPT, INR,  in the last 72 hours No results found for this basename: LABURIN,  in the last 72 hours Results for orders placed during the hospital encounter of 12/02/13  CULTURE, BLOOD (ROUTINE X 2)     Status: None   Collection Time    12/02/13  5:45 AM      Result Value Ref Range Status   Specimen Description BLOOD RIGHT ARM   Final   Special Requests BOTTLES DRAWN AEROBIC AND ANAEROBIC   Final   Culture  Setup Time     Final   Value: 12/02/2013 10:16     Performed at [facility]   Culture     Final   Value:        BLOOD CULTURE RECEIVED NO GROWTH TO DATE CULTURE WILL BE HELD FOR 5 DAYS BEFORE ISSUING A FINAL NEGATIVE REPORT     Performed at [facility]   Report Status PENDING   Incomplete  CULTURE, BLOOD (ROUTINE X 2)     Status: None   Collection Time    12/02/13  5:45 AM      Result Value Ref Range Status   Specimen Description BLOOD RIGHT ARM   Final   Special Requests BOTTLES DRAWN AEROBIC AND ANAEROBIC   Final   Culture  Setup Time     Final   Value: 12/02/2013 10:16     Performed at [facility]   Culture     Final   Value:        BLOOD CULTURE RECEIVED NO GROWTH TO DATE CULTURE WILL BE HELD FOR 5 DAYS BEFORE ISSUING A FINAL NEGATIVE REPORT     Performed at [facility]   Report Status PENDING   Incomplete  URINE CULTURE     Status: None   Collection Time    12/02/13  6:11 AM      Result Value Ref Range  Status   Specimen Description URINE, CLEAN CATCH   Final   Special Requests NONE   Final   Culture  Setup Time     Final   Value: 12/02/2013 10:20     Performed at [facility]   Colony Count     Final   Value: 20,OOO COLONIES/ML     Performed at [facility]   Culture     Final   Value: Multiple bacterial morphotypes present, none predominant. Suggest appropriate recollection if clinically indicated.     Performed at [facility]   Report Status 12/03/2013 FINAL   Final    Studies/Results: [place] Scrotum  12/02/2013   CLINICAL DATA:  Bilateral scrotal pain  EXAM: SCROTAL ULTRASOUND  DOPPLER ULTRASOUND OF THE TESTICLES  TECHNIQUE: Complete ultrasound examination of the testicles, epididymis, and other scrotal structures was performed. Color and spectral Doppler ultrasound were also utilized to evaluate blood flow to the testicles.   COMPARISON:  06/30/2011, 11/08/2013  FINDINGS: Right testicle  Measurements: 4.5 x 2.2 x 2.1 cm. No mass or microlithiasis visualized.  Left testicle  Measurements: 5.3 x 3.7 x 4.1 cm. No mass or microlithiasis visualized.  Right epididymis: Enlarged, heterogeneous in echogenicity. Increased vascularity  Left epididymis: Enlarged, heterogeneous in echogenicity. Increased vascularity.  Hydrocele:  Small left hydrocele with septations.  Varicocele:  Bilateral varicoceles.  Pulsed Doppler interrogation of both testes demonstrates low resistance arterial and venous waveforms bilaterally. Bilateral testicles appear hypervascular.  IMPRESSION: Bilateral testicle and epididymis hypervascularity may reflect epididymo-orchitis.  Small complex left hydrocele for which hematocele or pyocele is not excluded. The appearance is similar in complexity however smaller than the prior.   Electronically Signed   By: Jearld LeschAndrew  DelGaizo M.D.   On: 12/02/2013 05:35   [place] Art/ven Flow Abd Pelv Doppler  12/02/2013   CLINICAL DATA:  Bilateral scrotal pain  EXAM: SCROTAL ULTRASOUND  DOPPLER ULTRASOUND OF THE TESTICLES  TECHNIQUE: Complete ultrasound examination of the testicles, epididymis, and other scrotal structures was performed. Color and spectral Doppler ultrasound were also utilized to evaluate blood flow to the testicles.  COMPARISON:  06/30/2011, 11/08/2013  FINDINGS: Right testicle  Measurements: 4.5 x 2.2 x 2.1 cm. No mass or microlithiasis visualized.  Left testicle  Measurements: 5.3 x 3.7 x 4.1 cm. No mass or microlithiasis visualized.  Right epididymis: Enlarged, heterogeneous in echogenicity. Increased vascularity  Left epididymis: Enlarged, heterogeneous in echogenicity. Increased vascularity.  Hydrocele:  Small left hydrocele with septations.  Varicocele:  Bilateral varicoceles.  Pulsed Doppler interrogation of both testes demonstrates low resistance arterial and venous waveforms bilaterally. Bilateral testicles appear  hypervascular.  IMPRESSION: Bilateral testicle and epididymis hypervascularity may reflect epididymo-orchitis.  Small complex left hydrocele for which hematocele or pyocele is not excluded. The appearance is similar in complexity however smaller than the prior.   Electronically Signed   By: Jearld LeschAndrew  DelGaizo M.D.   On: 12/02/2013 05:35    Assessment/Plan:   Epididymal orchitis improving. Unfortunately urine culture here did not show definitive uropathogen and therefore the decision on appropriate oral antibiotics at the time of discharge will be more difficult. He clearly has improved on IV/parenteral antibiotics. I will review his most recent culture from our office to try to get better guidance for appropriate conversion to oral antibiotics and would think he may be ready for discharge tomorrow.   LOS: 1 day   Nosson Wender S 12/03/2013, 1:48 PM

## 2013-12-04 LAB — GLUCOSE, CAPILLARY
GLUCOSE-CAPILLARY: 163 mg/dL — AB (ref 70–99)
Glucose-Capillary: 251 mg/dL — ABNORMAL HIGH (ref 70–99)

## 2013-12-04 MED ORDER — SULFAMETHOXAZOLE-TRIMETHOPRIM 400-80 MG PO TABS: 1.0000 | ORAL_TABLET | Freq: Two times a day (BID) | ORAL | Status: DC

## 2013-12-04 NOTE — Discharge Summary (Signed)
Physician Discharge Summary  Patient ID: Daniel Morrow MRN: [id number] DOB/AGE: 71/11/1940 71 y.o.  Admit date: 12/02/2013 Discharge date: 12/04/2013  Admission Diagnoses:  Discharge Diagnoses:  Principal Problem:   Epididymo-orchitis Active Problems:   Hypertension   CVA (cerebral infarction)   Orchitis and epididymitis Diabetes  Discharged Condition: stable  Hospital Course:   Patient presented to the hospital with complaint of testicular pain and swelling in the ED he was evaluated, had a fever to 103. Admission was deemed necessary for further evaluation and treatment. Please see dictated H&P for further details. Patient was started on IV antibiotics, Zosyn, he was seen in consultation by his urologist. Patient had rapid improvement, remained afebrile, reduction of leukocytosis and improvement in his discomfort. Case was discussed with urology, he reviewed records in the past apparently patient was treated with Augmentin in the past and switched to Bactrim based on sensitivities, at this time he recommends continuance of Bactrim twice a day for 2 weeks with outpatient followup.  Consults: Treatment Team:  Kathi LudwigSigmund I Tannenbaum, MD  Significant Diagnostic Studies:Us Scrotum  12/02/2013   CLINICAL DATA:  Bilateral scrotal pain  EXAM: SCROTAL ULTRASOUND  DOPPLER ULTRASOUND OF THE TESTICLES  TECHNIQUE: Complete ultrasound examination of the testicles, epididymis, and other scrotal structures was performed. Color and spectral Doppler ultrasound were also utilized to evaluate blood flow to the testicles.  COMPARISON:  06/30/2011, 11/08/2013  FINDINGS: Right testicle  Measurements: 4.5 x 2.2 x 2.1 cm. No mass or microlithiasis visualized.  Left testicle  Measurements: 5.3 x 3.7 x 4.1 cm. No mass or microlithiasis visualized.  Right epididymis: Enlarged, heterogeneous in echogenicity. Increased vascularity  Left epididymis: Enlarged, heterogeneous in echogenicity. Increased vascularity.   Hydrocele:  Small left hydrocele with septations.  Varicocele:  Bilateral varicoceles.  Pulsed Doppler interrogation of both testes demonstrates low resistance arterial and venous waveforms bilaterally. Bilateral testicles appear hypervascular.  IMPRESSION: Bilateral testicle and epididymis hypervascularity may reflect epididymo-orchitis.  Small complex left hydrocele for which hematocele or pyocele is not excluded. The appearance is similar in complexity however smaller than the prior.   Electronically Signed   By: Jearld LeschAndrew  DelGaizo M.D.   On: 12/02/2013 05:35   [place] Art/ven Flow Abd Pelv Doppler  12/02/2013   CLINICAL DATA:  Bilateral scrotal pain  EXAM: SCROTAL ULTRASOUND  DOPPLER ULTRASOUND OF THE TESTICLES  TECHNIQUE: Complete ultrasound examination of the testicles, epididymis, and other scrotal structures was performed. Color and spectral Doppler ultrasound were also utilized to evaluate blood flow to the testicles.  COMPARISON:  06/30/2011, 11/08/2013  FINDINGS: Right testicle  Measurements: 4.5 x 2.2 x 2.1 cm. No mass or microlithiasis visualized.  Left testicle  Measurements: 5.3 x 3.7 x 4.1 cm. No mass or microlithiasis visualized.  Right epididymis: Enlarged, heterogeneous in echogenicity. Increased vascularity  Left epididymis: Enlarged, heterogeneous in echogenicity. Increased vascularity.  Hydrocele:  Small left hydrocele with septations.  Varicocele:  Bilateral varicoceles.  Pulsed Doppler interrogation of both testes demonstrates low resistance arterial and venous waveforms bilaterally. Bilateral testicles appear hypervascular.  IMPRESSION: Bilateral testicle and epididymis hypervascularity may reflect epididymo-orchitis.  Small complex left hydrocele for which hematocele or pyocele is not excluded. The appearance is similar in complexity however smaller than the prior.   Electronically Signed   By: Jearld LeschAndrew  DelGaizo M.D.   On: 12/02/2013 05:35      Discharge Exam: Blood pressure 154/76, pulse  81, temperature 98.1 F (36.7 C), temperature source Oral, resp. rate 18, height 5\' 9"  (1.753 m), weight  85.684 kg (188 lb 14.4 oz), SpO2 92.00%. Male genitalia: Decreased testicular size, minimal erythema, significant improvement in tenderness, no drainage.  Disposition: 01-Home or Self Care     Medication List         AGGRENOX 200-25 MG per 12 hr capsule  Generic drug:  dipyridamole-aspirin  Take 1 capsule by mouth 2 (two) times daily.     atorvastatin 40 MG tablet  Commonly known as:  LIPITOR  Take 40 mg by mouth daily.     benazepril 10 MG tablet  Commonly known as:  LOTENSIN  Take 10 mg by mouth daily.     hydrochlorothiazide 25 MG tablet  Commonly known as:  HYDRODIURIL  Take 25 mg by mouth daily.     JANUMET 50-1000 MG per tablet  Generic drug:  sitaGLIPtin-metformin  Take 1 tablet by mouth 2 (two) times daily with a meal.     multivitamin with minerals Tabs tablet  Take 1 tablet by mouth daily.     pantoprazole 40 MG tablet  Commonly known as:  PROTONIX  Take 40 mg by mouth daily.     PRANDIN 0.5 MG tablet  Generic drug:  repaglinide  Take 0.5 mg by mouth 3 (three) times daily before meals.     sulfamethoxazole-trimethoprim 400-80 MG per tablet  Commonly known as:  BACTRIM  Take 1 tablet by mouth 2 (two) times daily.       Follow-up Information   Follow up with Kathi Ludwig, MD.   Specialty:  Urology   Contact information:   [address] [facility]  [place] [address] [phone]       Follow up In 2 weeks.      SignedRenford Dills D 12/04/2013, 11:10 AM

## 2013-12-04 NOTE — Progress Notes (Signed)
Subjective: Patient continues to feel well, no fever no chills, no testicular pain  Objective: Vital signs in last 24 hours: Temp:  [98.1 F (36.7 C)-98.4 F (36.9 C)] 98.1 F (36.7 C) (03/15 0552) Pulse Rate:  [81] 81 (03/15 0552) Resp:  [16-18] 18 (03/15 0552) BP: (152-154)/(76-78) 154/76 mmHg (03/15 0552) SpO2:  [92 %-94 %] 92 % (03/15 0552) Weight change:  Last BM Date: 12/03/13  Intake/Output from previous day: 03/14 0701 - 03/15 0700 In: 297 [P.O.:222; I.V.:75] Out: 550 [Urine:550] Intake/Output this shift: Total I/O In: 240 [P.O.:240] Out: 100 [Urine:100]  General appearance: alert Male genitalia: normal, No testicular pain on exam no obvious drainage no warmth, some resolving erythema  Lab Results:  Results for orders placed during the hospital encounter of 12/02/13 (from the past 24 hour(s))  GLUCOSE, CAPILLARY     Status: Abnormal   Collection Time    12/03/13 11:47 AM      Result Value Ref Range   Glucose-Capillary 182 (*) 70 - 99 mg/dL  GLUCOSE, CAPILLARY     Status: Abnormal   Collection Time    12/03/13  5:06 PM      Result Value Ref Range   Glucose-Capillary 199 (*) 70 - 99 mg/dL  GLUCOSE, CAPILLARY     Status: Abnormal   Collection Time    12/03/13  8:44 PM      Result Value Ref Range   Glucose-Capillary 208 (*) 70 - 99 mg/dL   Comment 1 Documented in Chart     Comment 2 Notify RN    GLUCOSE, CAPILLARY     Status: Abnormal   Collection Time    12/04/13  7:44 AM      Result Value Ref Range   Glucose-Capillary 163 (*) 70 - 99 mg/dL      Studies/Results: No results found.  Medications:  Prior to Admission:  Prescriptions prior to admission  Medication Sig Dispense Refill  . AGGRENOX 25-200 MG per 12 hr capsule Take 1 capsule by mouth 2 (two) times daily.      Marland Kitchen atorvastatin (LIPITOR) 40 MG tablet Take 40 mg by mouth daily.      . benazepril (LOTENSIN) 10 MG tablet Take 10 mg by mouth daily.      . hydrochlorothiazide (HYDRODIURIL) 25 MG  tablet Take 25 mg by mouth daily.      . Multiple Vitamin (MULTIVITAMIN WITH MINERALS) TABS tablet Take 1 tablet by mouth daily.      . pantoprazole (PROTONIX) 40 MG tablet Take 40 mg by mouth daily.      . repaglinide (PRANDIN) 0.5 MG tablet Take 0.5 mg by mouth 3 (three) times daily before meals.      . sitaGLIPtan-metformin (JANUMET) 50-1000 MG per tablet Take 1 tablet by mouth 2 (two) times daily with a meal.       Scheduled: . atorvastatin  40 mg Oral Daily  . benazepril  10 mg Oral Daily  . dipyridamole-aspirin  1 capsule Oral BID  . enoxaparin (LOVENOX) injection  40 mg Subcutaneous Q24H  . insulin aspart  0-9 Units Subcutaneous TID WC  . multivitamin with minerals  1 tablet Oral Daily  . pantoprazole  40 mg Oral Daily  . piperacillin-tazobactam (ZOSYN)  IV  3.375 g Intravenous 3 times per day   Continuous:  ZOX:[id number], HYDROcodone-acetaminophen, ondansetron (ZOFRAN) IV  Assessment/Plan: Epididymo-orchitis, patient appears to be responding to IV antibiotics. There is reduce pain, decreased leukocytosis. At this time feel patient medically stable for discharge  home await input from neurology in regards to antibiotic selection. Active Problems:  Hypertension  CVA (cerebral infarction)  Diabetes, resume oral medications as clinical course allows.   LOS: 2 days   Ellanor Feuerstein D 12/04/2013, 10:57 AM

## 2013-12-04 NOTE — Progress Notes (Signed)
Subjective: Patient reports feeling better. No ongoing scrotal/testicular pain. Urine culture shows 20,000 colonies of ID and sensitivities not performed. Most recent urine culture in our office in mid February was positive for Serratia sensitive to Cipro and Septra. The patient was treated with one week of Septra  Objective: Vital signs in last 24 hours: Temp:  [98.1 F (36.7 C)-98.4 F (36.9 C)] 98.1 F (36.7 C) (03/15 0552) Pulse Rate:  [81] 81 (03/15 0552) Resp:  [16-18] 18 (03/15 0552) BP: (152-154)/(76-78) 154/76 mmHg (03/15 0552) SpO2:  [92 %-94 %] 92 % (03/15 0552)  Intake/Output from previous day: 03/14 0701 - 03/15 0700 In: 297 [P.O.:222; I.V.:75] Out: 550 [Urine:550] Intake/Output this shift: Total I/O In: 240 [P.O.:240] Out: 100 [Urine:100]  Physical Exam:  Constitutional: Vital signs reviewed. WD WN in NAD   Eyes: PERRL, No scleral icterus.   Cardiovascular: RRR Pulmonary/Chest: Normal effort Abdominal: Soft. Non-tender, non-distended, bowel sounds are normal, no masses, organomegaly, or guarding present.  Genitourinary: Improved swelling and decreased tenderness Extremities: No cyanosis or edema   Lab Results:  Recent Labs  12/02/13 0545 12/03/13 0645  HGB 11.6* 11.1*  HCT 35.1* 34.4*   BMET  Recent Labs  12/02/13 0545 12/03/13 0645  NA 138 142  K 3.9 3.8  CL 97 101  CO2 27 26  GLUCOSE 212* 153*  BUN 18 14  CREATININE 1.02 0.98  CALCIUM 8.5 8.5   No results found for this basename: LABPT, INR,  in the last 72 hours No results found for this basename: LABURIN,  in the last 72 hours Results for orders placed during the hospital encounter of 12/02/13  CULTURE, BLOOD (ROUTINE X 2)     Status: None   Collection Time    12/02/13  5:45 AM      Result Value Ref Range Status   Specimen Description BLOOD RIGHT ARM   Final   Special Requests BOTTLES DRAWN AEROBIC AND ANAEROBIC   Final   Culture  Setup Time     Final   Value: 12/02/2013  10:16     Performed at [facility]   Culture     Final   Value:        BLOOD CULTURE RECEIVED NO GROWTH TO DATE CULTURE WILL BE HELD FOR 5 DAYS BEFORE ISSUING A FINAL NEGATIVE REPORT     Performed at [facility]   Report Status PENDING   Incomplete  CULTURE, BLOOD (ROUTINE X 2)     Status: None   Collection Time    12/02/13  5:45 AM      Result Value Ref Range Status   Specimen Description BLOOD RIGHT ARM   Final   Special Requests BOTTLES DRAWN AEROBIC AND ANAEROBIC   Final   Culture  Setup Time     Final   Value: 12/02/2013 10:16     Performed at [facility]   Culture     Final   Value:        BLOOD CULTURE RECEIVED NO GROWTH TO DATE CULTURE WILL BE HELD FOR 5 DAYS BEFORE ISSUING A FINAL NEGATIVE REPORT     Performed at [facility]   Report Status PENDING   Incomplete  URINE CULTURE     Status: None   Collection Time    12/02/13  6:11 AM      Result Value Ref Range Status   Specimen Description URINE, CLEAN CATCH   Final   Special Requests NONE  Final   Culture  Setup Time     Final   Value: 12/02/2013 10:20     Performed at [facility]   Colony Count     Final   Value: 20,OOO COLONIES/ML     Performed at [facility]   Culture     Final   Value: Multiple bacterial morphotypes present, none predominant. Suggest appropriate recollection if clinically indicated.     Performed at [facility]   Report Status 12/03/2013 FINAL   Final    Studies/Results: No results found.  Assessment/Plan:   Improving epididymoorchitis. Based on previous culture results would recommend Septra double strength twice a day x2 weeks. Follow up with Dr. Patsi Searsannenbaum   LOS: 2 days   Daniel Morrow S 12/04/2013, 12:18 PM

## 2013-12-08 LAB — CULTURE, BLOOD (ROUTINE X 2)
CULTURE: NO GROWTH
Culture: NO GROWTH

## 2015-02-08 ENCOUNTER — Telehealth: Payer: Self-pay | Admitting: Neurology

## 2015-02-08 NOTE — Telephone Encounter (Signed)
It does not appear the patient has been seen by our office since 2014.  I called back.  Spoke with Ms Earlene PlaterDavis.  She said they have been getting this med from PCP, and will contact them regarding drug change.

## 2015-02-08 NOTE — Telephone Encounter (Signed)
Pt's wife(Trudy) called stating AGGRENOX 25-200 MG per 12 hr capsule costly and can not afford it. She is requesting an alternative medication Clopidogrel bisulfate or any other alternative that would be a reasonable price. Please call and advise. Damian Leavellrudy can be reached at [phone].

## 2015-10-30 DIAGNOSIS — E1142 Type 2 diabetes mellitus with diabetic polyneuropathy: Secondary | ICD-10-CM | POA: Diagnosis not present

## 2015-12-04 DIAGNOSIS — E1142 Type 2 diabetes mellitus with diabetic polyneuropathy: Secondary | ICD-10-CM | POA: Diagnosis not present

## 2015-12-04 DIAGNOSIS — E785 Hyperlipidemia, unspecified: Secondary | ICD-10-CM | POA: Diagnosis not present

## 2015-12-04 DIAGNOSIS — Z1389 Encounter for screening for other disorder: Secondary | ICD-10-CM | POA: Diagnosis not present

## 2015-12-04 DIAGNOSIS — G252 Other specified forms of tremor: Secondary | ICD-10-CM | POA: Diagnosis not present

## 2015-12-04 DIAGNOSIS — D649 Anemia, unspecified: Secondary | ICD-10-CM | POA: Diagnosis not present

## 2015-12-04 DIAGNOSIS — Z0000 Encounter for general adult medical examination without abnormal findings: Secondary | ICD-10-CM | POA: Diagnosis not present

## 2015-12-04 DIAGNOSIS — E1121 Type 2 diabetes mellitus with diabetic nephropathy: Secondary | ICD-10-CM | POA: Diagnosis not present

## 2015-12-04 DIAGNOSIS — I6930 Unspecified sequelae of cerebral infarction: Secondary | ICD-10-CM | POA: Diagnosis not present

## 2015-12-04 DIAGNOSIS — I10 Essential (primary) hypertension: Secondary | ICD-10-CM | POA: Diagnosis not present

## 2015-12-04 DIAGNOSIS — I6509 Occlusion and stenosis of unspecified vertebral artery: Secondary | ICD-10-CM | POA: Diagnosis not present

## 2015-12-12 DIAGNOSIS — J209 Acute bronchitis, unspecified: Secondary | ICD-10-CM | POA: Diagnosis not present

## 2015-12-12 DIAGNOSIS — J069 Acute upper respiratory infection, unspecified: Secondary | ICD-10-CM | POA: Diagnosis not present

## 2015-12-12 DIAGNOSIS — J309 Allergic rhinitis, unspecified: Secondary | ICD-10-CM | POA: Diagnosis not present

## 2015-12-13 ENCOUNTER — Emergency Department (HOSPITAL_COMMUNITY)
Admission: EM | Admit: 2015-12-13 | Discharge: 2015-12-13 | Disposition: A | Discharge status: Home / Self Care | Payer: Medicare Other | Attending: Emergency Medicine | Admitting: Emergency Medicine

## 2015-12-13 ENCOUNTER — Emergency Department (HOSPITAL_COMMUNITY): Payer: Medicare Other

## 2015-12-13 ENCOUNTER — Encounter (HOSPITAL_COMMUNITY): Payer: Self-pay | Admitting: *Deleted

## 2015-12-13 DIAGNOSIS — Z8673 Personal history of transient ischemic attack (TIA), and cerebral infarction without residual deficits: Secondary | ICD-10-CM

## 2015-12-13 DIAGNOSIS — Z79899 Other long term (current) drug therapy: Secondary | ICD-10-CM | POA: Insufficient documentation

## 2015-12-13 DIAGNOSIS — Z792 Long term (current) use of antibiotics: Secondary | ICD-10-CM | POA: Insufficient documentation

## 2015-12-13 DIAGNOSIS — E785 Hyperlipidemia, unspecified: Secondary | ICD-10-CM | POA: Diagnosis not present

## 2015-12-13 DIAGNOSIS — R05 Cough: Secondary | ICD-10-CM

## 2015-12-13 DIAGNOSIS — R4182 Altered mental status, unspecified: Secondary | ICD-10-CM | POA: Diagnosis not present

## 2015-12-13 DIAGNOSIS — I10 Essential (primary) hypertension: Secondary | ICD-10-CM | POA: Insufficient documentation

## 2015-12-13 DIAGNOSIS — J209 Acute bronchitis, unspecified: Secondary | ICD-10-CM | POA: Insufficient documentation

## 2015-12-13 DIAGNOSIS — M542 Cervicalgia: Secondary | ICD-10-CM | POA: Insufficient documentation

## 2015-12-13 DIAGNOSIS — E119 Type 2 diabetes mellitus without complications: Secondary | ICD-10-CM | POA: Diagnosis not present

## 2015-12-13 DIAGNOSIS — R410 Disorientation, unspecified: Secondary | ICD-10-CM | POA: Diagnosis not present

## 2015-12-13 DIAGNOSIS — M4802 Spinal stenosis, cervical region: Secondary | ICD-10-CM | POA: Diagnosis not present

## 2015-12-13 DIAGNOSIS — R059 Cough, unspecified: Secondary | ICD-10-CM

## 2015-12-13 HISTORY — DX: Cerebral infarction, unspecified: I63.9

## 2015-12-13 LAB — URINE MICROSCOPIC-ADD ON

## 2015-12-13 LAB — URINALYSIS, ROUTINE W REFLEX MICROSCOPIC
BILIRUBIN URINE: NEGATIVE
GLUCOSE, UA: NEGATIVE mg/dL
Hgb urine dipstick: NEGATIVE
KETONES UR: NEGATIVE mg/dL
LEUKOCYTES UA: NEGATIVE
Nitrite: NEGATIVE
PROTEIN: 100 mg/dL — AB
Specific Gravity, Urine: 1.02 (ref 1.005–1.030)
pH: 6.5 (ref 5.0–8.0)

## 2015-12-13 LAB — CBC WITH DIFFERENTIAL/PLATELET
BASOS ABS: 0 10*3/uL (ref 0.0–0.1)
Basophils Relative: 1 %
EOS ABS: 0.1 10*3/uL (ref 0.0–0.7)
Eosinophils Relative: 2 %
HCT: 37.6 % — ABNORMAL LOW (ref 39.0–52.0)
HEMOGLOBIN: 11.7 g/dL — AB (ref 13.0–17.0)
LYMPHS ABS: 0.5 10*3/uL — AB (ref 0.7–4.0)
LYMPHS PCT: 8 %
MCH: 26.8 pg (ref 26.0–34.0)
MCHC: 31.1 g/dL (ref 30.0–36.0)
MCV: 86 fL (ref 78.0–100.0)
Monocytes Absolute: 1.2 10*3/uL — ABNORMAL HIGH (ref 0.1–1.0)
Monocytes Relative: 18 %
NEUTROS PCT: 73 %
Neutro Abs: 4.9 10*3/uL (ref 1.7–7.7)
Platelets: 219 10*3/uL (ref 150–400)
RBC: 4.37 MIL/uL (ref 4.22–5.81)
RDW: 14.6 % (ref 11.5–15.5)
WBC: 6.7 10*3/uL (ref 4.0–10.5)

## 2015-12-13 LAB — I-STAT TROPONIN, ED: TROPONIN I, POC: 0 ng/mL (ref 0.00–0.08)

## 2015-12-13 LAB — COMPREHENSIVE METABOLIC PANEL
ALT: 30 U/L (ref 17–63)
ANION GAP: 9 (ref 5–15)
AST: 33 U/L (ref 15–41)
Albumin: 4.1 g/dL (ref 3.5–5.0)
Alkaline Phosphatase: 75 U/L (ref 38–126)
BUN: 15 mg/dL (ref 6–20)
CHLORIDE: 101 mmol/L (ref 101–111)
CO2: 28 mmol/L (ref 22–32)
Calcium: 9 mg/dL (ref 8.9–10.3)
Creatinine, Ser: 1.1 mg/dL (ref 0.61–1.24)
Glucose, Bld: 189 mg/dL — ABNORMAL HIGH (ref 65–99)
POTASSIUM: 3.9 mmol/L (ref 3.5–5.1)
Sodium: 138 mmol/L (ref 135–145)
Total Bilirubin: 1.2 mg/dL (ref 0.3–1.2)
Total Protein: 7.2 g/dL (ref 6.5–8.1)

## 2015-12-13 LAB — BRAIN NATRIURETIC PEPTIDE: B NATRIURETIC PEPTIDE 5: 17.4 pg/mL (ref 0.0–100.0)

## 2015-12-13 LAB — PROTIME-INR
INR: 0.98 (ref 0.00–1.49)
PROTHROMBIN TIME: 13.2 s (ref 11.6–15.2)

## 2015-12-13 LAB — CBG MONITORING, ED: Glucose-Capillary: 177 mg/dL — ABNORMAL HIGH (ref 65–99)

## 2015-12-13 MED ORDER — ALBUTEROL SULFATE HFA 108 (90 BASE) MCG/ACT IN AERS
2.0000 | INHALATION_SPRAY | RESPIRATORY_TRACT | Status: DC | PRN
  Administered 2015-12-13: 2 via RESPIRATORY_TRACT
  Filled 2015-12-13: qty 6.7

## 2015-12-13 MED ORDER — MORPHINE SULFATE (PF) 4 MG/ML IV SOLN
4.0000 mg | Freq: Once | INTRAVENOUS | Status: AC
Start: 2015-12-13 — End: 2015-12-13
  Administered 2015-12-13: 4 mg via INTRAVENOUS
  Filled 2015-12-13: qty 1

## 2015-12-13 MED ORDER — PREDNISONE 10 MG PO TABS: 20.0000 mg | ORAL_TABLET | Freq: Two times a day (BID) | ORAL | Status: DC

## 2015-12-13 MED ORDER — ALBUTEROL SULFATE (2.5 MG/3ML) 0.083% IN NEBU
5.0000 mg | INHALATION_SOLUTION | Freq: Once | RESPIRATORY_TRACT | Status: AC
  Administered 2015-12-13: 5 mg via RESPIRATORY_TRACT
  Filled 2015-12-13: qty 6

## 2015-12-13 MED ORDER — DOXYCYCLINE HYCLATE 100 MG PO CAPS: 100.0000 mg | ORAL_CAPSULE | Freq: Two times a day (BID) | ORAL | Status: DC

## 2015-12-13 MED ORDER — ALBUTEROL SULFATE (2.5 MG/3ML) 0.083% IN NEBU
2.5000 mg | INHALATION_SOLUTION | RESPIRATORY_TRACT | Status: DC | PRN
Start: 2015-12-13 — End: 2017-07-01

## 2015-12-13 MED ORDER — GUAIFENESIN-CODEINE 100-10 MG/5ML PO SOLN: 10.0000 mL | Freq: Four times a day (QID) | ORAL | Status: DC | PRN

## 2015-12-13 NOTE — Discharge Instructions (Signed)
Stop Zithromax. Start taking doxycycline.  Prednisone as prescribed. Robitussin with codeine as prescribed as needed for cough.  Albuterol nebulizer treatment every 4 hours as needed for wheezing.  Return to the emergency department if symptoms significantly worsen or change.  Be sure to follow-up the results of your MRI with your primary Dr. for possible referral to a neurosurgeon regarding spinal stenosis.   Acute Bronchitis Bronchitis is inflammation of the airways that extend from the windpipe into the lungs (bronchi). The inflammation often causes mucus to develop. This leads to a cough, which is the most common symptom of bronchitis.  In acute bronchitis, the condition usually develops suddenly and goes away over time, usually in a couple weeks. Smoking, allergies, and asthma can make bronchitis worse. Repeated episodes of bronchitis may cause further lung problems.  CAUSES Acute bronchitis is most often caused by the same virus that causes a cold. The virus can spread from person to person (contagious) through coughing, sneezing, and touching contaminated objects. SIGNS AND SYMPTOMS   Cough.   Fever.   Coughing up mucus.   Body aches.   Chest congestion.   Chills.   Shortness of breath.   Sore throat.  DIAGNOSIS  Acute bronchitis is usually diagnosed through a physical exam. Your health care provider will also ask you questions about your medical history. Tests, such as chest X-rays, are sometimes done to rule out other conditions.  TREATMENT  Acute bronchitis usually goes away in a couple weeks. Oftentimes, no medical treatment is necessary. Medicines are sometimes given for relief of fever or cough. Antibiotic medicines are usually not needed but may be prescribed in certain situations. In some cases, an inhaler may be recommended to help reduce shortness of breath and control the cough. A cool mist vaporizer may also be used to help thin bronchial secretions and  make it easier to clear the chest.  HOME CARE INSTRUCTIONS  Get plenty of rest.   Drink enough fluids to keep your urine clear or pale yellow (unless you have a medical condition that requires fluid restriction). Increasing fluids may help thin your respiratory secretions (sputum) and reduce chest congestion, and it will prevent dehydration.   Take medicines only as directed by your health care provider.  If you were prescribed an antibiotic medicine, finish it all even if you start to feel better.  Avoid smoking and secondhand smoke. Exposure to cigarette smoke or irritating chemicals will make bronchitis worse. If you are a smoker, consider using nicotine gum or skin patches to help control withdrawal symptoms. Quitting smoking will help your lungs heal faster.   Reduce the chances of another bout of acute bronchitis by washing your hands frequently, avoiding people with cold symptoms, and trying not to touch your hands to your mouth, nose, or eyes.   Keep all follow-up visits as directed by your health care provider.  SEEK MEDICAL CARE IF: Your symptoms do not improve after 1 week of treatment.  SEEK IMMEDIATE MEDICAL CARE IF:  You develop an increased fever or chills.   You have chest pain.   You have severe shortness of breath.  You have bloody sputum.   You develop dehydration.  You faint or repeatedly feel like you are going to pass out.  You develop repeated vomiting.  You develop a severe headache. MAKE SURE YOU:   Understand these instructions.  Will watch your condition.  Will get help right away if you are not doing well or get worse.  This information is not intended to replace advice given to you by your health care provider. Make sure you discuss any questions you have with your health care provider.   Document Released: 10/16/2004 Document Revised: 09/29/2014 Document Reviewed: 03/01/2013 Elsevier Interactive Patient Education [employer]2016 [employer]

## 2015-12-13 NOTE — ED Notes (Signed)
Pt here with coughing and family thinks he is acting "strange".  Family reports pt has been confused this am.  Pt now GCS 15. Answers questions correctly. MD at bedside.  CBG 177.

## 2015-12-13 NOTE — ED Provider Notes (Signed)
CSN: [id number]     Arrival date & time 12/13/15  0945 History   First MD Initiated Contact with Patient 12/13/15 [phone]     Chief Complaint  Patient presents with  . Cough  . Altered Mental Status     (Consider location/radiation/quality/duration/timing/severity/associated sxs/prior Treatment) HPI Comments: Patient is a 75 year old male with past medical history of hypertension, diabetes, high cholesterol, and prior CVA of the posterior circulation. He presents today for evaluation of cough for the past 2 weeks. He went to the urgent care clinic yesterday and was prescribed antibiotics and cough medication after being told his oxygen saturations were somewhat low. This morning his wife reports he seemed confused speaking about insurance matters that were dealt with last week and he had no recollection of this. He is also complaining of some pain in his neck in the absence of any injury or trauma. The wife states that his prior CVA started with pain in his neck.  Patient is a 75 y.o. male presenting with cough. The history is provided by the patient.  Cough Cough characteristics:  Non-productive Severity:  Moderate Onset quality:  Gradual Duration:  2 weeks Timing:  Constant Progression:  Worsening Chronicity:  New Smoker: no   Relieved by:  Nothing Worsened by:  Nothing tried Ineffective treatments:  None tried Associated symptoms: no chest pain and no fever     Past Medical History  Diagnosis Date  . Hypertension   . Diabetes mellitus   . Hyperlipidemia   . Stroke [facility])    Past Surgical History  Procedure Laterality Date  . Knee arthroscopy    . Neck surgery     No family history on file. Social History  Substance Use Topics  . Smoking status: Never Smoker   . Smokeless tobacco: Never Used  . Alcohol Use: No    Review of Systems  Constitutional: Negative for fever.  Respiratory: Positive for cough.   Cardiovascular: Negative for chest pain.  All other systems  reviewed and are negative.     Allergies  Review of patient's allergies indicates no known allergies.  Home Medications   Prior to Admission medications   Medication Sig Start Date End Date Taking? Authorizing Provider  AGGRENOX 25-200 MG per 12 hr capsule Take 1 capsule by mouth 2 (two) times daily. 05/06/13   Historical Provider, MD  atorvastatin (LIPITOR) 40 MG tablet Take 40 mg by mouth daily.    Historical Provider, MD  benazepril (LOTENSIN) 10 MG tablet Take 10 mg by mouth daily.    Historical Provider, MD  hydrochlorothiazide (HYDRODIURIL) 25 MG tablet Take 25 mg by mouth daily.    Historical Provider, MD  Multiple Vitamin (MULTIVITAMIN WITH MINERALS) TABS tablet Take 1 tablet by mouth daily.    Historical Provider, MD  pantoprazole (PROTONIX) 40 MG tablet Take 40 mg by mouth daily.    Historical Provider, MD  repaglinide (PRANDIN) 0.5 MG tablet Take 0.5 mg by mouth 3 (three) times daily before meals.    Historical Provider, MD  sitaGLIPtan-metformin (JANUMET) 50-1000 MG per tablet Take 1 tablet by mouth 2 (two) times daily with a meal.    Historical Provider, MD  sulfamethoxazole-trimethoprim (BACTRIM) 400-80 MG per tablet Take 1 tablet by mouth 2 (two) times daily. 12/04/13   Renford Dills, MD   BP 174/75 mmHg  Pulse 103  Temp(Src) 98.8 F (37.1 C) (Oral)  Resp 18  Ht  (1.803 m)  Wt 204 lb (92.534 kg)  BMI 28.46 kg/m2  SpO2 98% Physical Exam  Constitutional: He is oriented to person, place, and time. He appears well-developed and well-nourished. No distress.  HENT:  Head: Normocephalic and atraumatic.  Mouth/Throat: Oropharynx is clear and moist.  Neck: Normal range of motion. Neck supple.  Cardiovascular: Normal rate, regular rhythm and normal heart sounds.   No murmur heard. Pulmonary/Chest: Effort normal and breath sounds normal. No respiratory distress. He has no wheezes. He has no rales.  Abdominal: Soft. Bowel sounds are normal. He exhibits no distension.  There is no tenderness.  Musculoskeletal: Normal range of motion. He exhibits no edema.  Lymphadenopathy:    He has no cervical adenopathy.  Neurological: He is alert and oriented to person, place, and time.  Skin: Skin is warm and dry. He is not diaphoretic.  Nursing note and vitals reviewed.   ED Course  Procedures (including critical care time) Labs Review Labs Reviewed  CBG MONITORING, ED - Abnormal; Notable for the following:    Glucose-Capillary 177 (*)    All other components within normal limits  COMPREHENSIVE METABOLIC PANEL  CBC WITH DIFFERENTIAL/PLATELET  PROTIME-INR  BRAIN NATRIURETIC PEPTIDE  I-STAT TROPOININ, ED    Imaging Review No results found. I have personally reviewed and evaluated these images and lab results as part of my medical decision-making.   EKG Interpretation   Date/Time:  Thursday December 13 2015 09:56:52 EDT Ventricular Rate:  109 PR Interval:  178 QRS Duration: 99 QT Interval:  368 QTC Calculation: 496 R Axis:   74 Text Interpretation:  Sinus tachycardia Consider anterior infarct Minimal  ST depression, inferior leads Confirmed by Ruhaan Nordahl  MD, Patriciann Becht ([id number]) on  12/13/2015 10:02:40 AM      MDM   Final diagnoses:  None    Patient is a 75 year old male who presents with complaints of cough and neck pain. According to the family he seemed somewhat confused this morning. He had a stroke in the past with similar symptoms. Nothing in the workup today indicates a stroke and he is neurologically intact. The MRI of the brain is negative. Due to his complaining of severe neck pain, an MRI of the cervical spine was obtained which revealed spinal stenosis and multilevel degenerative changes, however nothing that appears emergently surgical or acute. There also appears to be no evidence for a cardiac etiology.  He was given nebulizer treatments with some relief. He will be discharged with an albuterol inhaler, prednisone, cough medication, and  doxycycline.    Geoffery Lyonsouglas Edlin Ford, MD 12/13/15 [phone]

## 2015-12-19 ENCOUNTER — Telehealth: Payer: Self-pay | Admitting: Neurology

## 2015-12-19 NOTE — Telephone Encounter (Signed)
Wife called to request appointment for husband to be seen, states he was seen at [facility] ED approximately 1 week ago, stroke was ruled out however wife feels like he needs to be seen because "he's not acting right, can't get things out when he's speaking, having bathroom problems, pee'ing in different places in the house, will sit in recliner and sleep with head hanging down instead of reclining chair back".

## 2015-12-19 NOTE — Telephone Encounter (Signed)
Rn call patients wife Damian Leavellrudy about her husband being seeing sooner. Rn stated according to Ed note, he did not have another stroke,and he was cleared from the neuro doctors. Rn advised wife to call PCP about the weird behavior. Pt was put on inhalers, cough medicine and prednisone for his other issues.Pt was last seen 2014 by Dr.Sethi. Rn schedule patient within 4 weeks for Ed follow up. Pts wife verbalized understanding.

## 2016-01-16 ENCOUNTER — Ambulatory Visit (INDEPENDENT_AMBULATORY_CARE_PROVIDER_SITE_OTHER): Payer: Medicare Other | Admitting: Neurology

## 2016-01-16 ENCOUNTER — Ambulatory Visit: Payer: Self-pay | Admitting: Neurology

## 2016-01-16 ENCOUNTER — Encounter: Payer: Self-pay | Admitting: Neurology

## 2016-01-16 VITALS — BP 131/76 | HR 71 | Ht 70.0 in | Wt 207.4 lb

## 2016-01-16 DIAGNOSIS — I6501 Occlusion and stenosis of right vertebral artery: Secondary | ICD-10-CM | POA: Diagnosis not present

## 2016-01-16 DIAGNOSIS — G301 Alzheimer's disease with late onset: Secondary | ICD-10-CM | POA: Diagnosis not present

## 2016-01-16 DIAGNOSIS — F0280 Dementia in other diseases classified elsewhere without behavioral disturbance: Secondary | ICD-10-CM | POA: Diagnosis not present

## 2016-01-16 DIAGNOSIS — G309 Alzheimer's disease, unspecified: Secondary | ICD-10-CM

## 2016-01-16 MED ORDER — MEMANTINE HCL 28 X 5 MG & 21 X 10 MG PO TABS: ORAL_TABLET | ORAL | Status: DC

## 2016-01-16 MED ORDER — ASPIRIN EC 325 MG PO TBEC: 325.0000 mg | DELAYED_RELEASE_TABLET | Freq: Every day | ORAL | Status: DC

## 2016-01-16 NOTE — Progress Notes (Signed)
[facility] [address] [place]. [place] [address] [phone]  OFFICE FOLLOW-UP NOTE  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 Vascular risk factors of diabetes, hypertension, hyperlipidemia and mild obesity. 05/18/2013 Today he returns for followup after last visit on 08/24/2012. She continues to do well without any recurrent stroke or TIA symptoms. Historically the Aggrenox well without headaches or side effects. He and his wife are in the donor pool and I worried about the cost but like to continue it since she's done so well. He states his sugar is under good control and blood pressure is doing fine it is 137/77 one in office today. He is not sure when he had his last lipid profile checked but apparently it was fine. He has been having mild short-term memory difficulties which are long-standing and not progressive. He has no new complaints today. Update 01/15/2016 : Patient returns for follow-up today after last visit more than 2 and half years ago. Is accompanied by his daughter. Patient was seen in image and 0 month [birth year] and 1 episode of confusion and his orientation. He woke up that day and could not remember events for the past few days and was quite confused. He also complained of posterior neck pain. He had lifted some weights the day before as there was some damage to his roof from his stump. The patient's daughter is accompanying him today states that he is had some mild intermittent confusion, short-term memory issues as well as more recently word finding difficulties in completing sentences. He is living at home with his wife. He had positive cerebellar infarct 3 years ago and since  then did have mild short-term memory difficulties. He was in fact started on Aricept 5 mg which he seems to tolerating well but family has noticed some fine action tremor of his hands. Initially the thought this was related to his depression medication which has been discontinued but the tremors have persisted. The tremors in both hands more in the left they're absent at rest. It did not interfere with his day-to-day activities. He has not had any trouble with gait, balance, drooling of saliva or bradykinesia. Patient recently had an upper respiratory tract infection and cough and when seen in the emergency room on 3/23//2017 and his oxygen levels were found to be low. He improved after giving oxygen as well as inhalers. MRI scan of the brain was obtained which I personally reviewed shows mild generalized atrophy and old cerebellar infarct no acute findings were noted. Urine analysis was normal and CBC and comprehensive metabolic panel labs were unremarkable. Patient remains on Aggrenox for stroke prevention but states that it is too expensive and would like to switch to a cheaper alternative. His blood pressure is well controlled and today it is 131/71. He is tolerating Lipitor well without muscle aches and pains. He states his blood sugars have all been under good control. Patient denies any recent falls or head injury. No history of seizures. There is no family history of dementia. He has not had any unsafe behavior, agitation, hallucinations, delusions. ROS:  14 system review of systems is positive for as stated in history of present illness and all other systems negative PMH:  Past Medical History  Diagnosis Date  . Hypertension   . Diabetes mellitus   .  Hyperlipidemia Cerebellar infarct 2013      Social History:  History   Social History  . Marital Status: Married    Spouse Name: [name]    Number of Children: 3  . Years of Education: College   Occupational  History  . Retired    Social History Main Topics  . Smoking status: Never Smoker   . Smokeless tobacco: Never Used  . Alcohol Use: No  . Drug Use: No  . Sexual Activity: Not on file   Other Topics Concern  . Not on file   Social History Narrative   Patient lives at home with family.   Caffeine Use: 2 cups daily       Medication Sig Dispense Refill                                        No current facility-administered medications on file prior to visit.    Allergies: No Known Allergies  Physical Exam General: well developed, well nourished Elderly Caucasian male, seated, in no evident distress Head: head normocephalic and atraumatic.   Neck: supple with no carotid or supraclavicular bruits Cardiovascular: regular rate and rhythm, no murmurs Musculoskeletal: no deformity Skin: no rash/petichiae Vascular: Normal pulses all extremities Filed Vitals:   05/18/13 1508  BP: 137/71  Pulse: 93  Temp: 98 F (36.7 C)    Neurologic Exam Mental Status: Awake and fully alert. Oriented to place and time. Recent and remote memory intact. Attention span, concentration and fund of knowledge appropriate. Mood and affect appropriate.  Cranial Nerves: Fundoscopic exam reveals sharp disc margins. Pupils equal, briskly reactive to light. Extraocular movements full without nystagmus. Visual fields full to confrontation. Hearing intact. Facial sensation intact. Face, tongue, palate moves normally and symmetrically.  Motor: Normal bulk and tone. Normal strength in all tested extremity muscles. Sensory.: intact to touch and pinprick and vibratory sensation.  Coordination: Rapid alternating movements normal in all extremities. Finger-to-nose and heel-to-shin performed accurately bilaterally. Gait and Station: Arises from chair without difficulty. Stance is normal. Gait demonstrates normal stride length and  balance . Able to heel, toe and tandem walk with minimal difficulty.  Reflexes: 1+ and symmetric. Toes downgoing.    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 Vascular risk factors of diabetes, hypertension, hyperlipidemia and mild obesity. 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 I recommend checking an EEG and lab work for reversible causes of dementia. Discontinue Aricept  as it may be contributing to his tremor and try Namenda instead. I have prescribed starter pack and discussed side effects. Discontinue Aggrenox as he cannot afford it and changed to aspirin to 325 mg for stroke prevention. Continue strict control of hypertension with blood pressure goal below 130/90 and lipids with LDL cholesterol goal below 70 mg percent. Check follow-up carotid and trans-and Doppler studies to follow his known vertebral stenosis. This was of prolonged visit taking 40 minutes with medical decision making of high complexity with extensive history taking, review of imaging studies,  beta and greater than 50% time spent with counseling about dementia, progression, stroke risk, stroke prevention and is asking questions He will return for follow-up in 2 months or call earlier if necessary  Delia HeadyPramod Lezley Bedgood, MD  [facility] [address] [place], [place] [id number]  Phone [phone] Fax [phone]   01/16/2016 10:24 AM

## 2016-01-16 NOTE — Patient Instructions (Signed)
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 I recommend checking an EEG and lab work for reversible causes of dementia. Discontinue Aricept  as it may be contributing to his tremor and try Namenda instead. I have prescribed starter pack and discussed side effects. Discontinue Aggrenox as he cannot afford it and changed to aspirin to 325 mg for stroke prevention. Continue strict control of hypertension with blood pressure goal below 130/90 and lipids with LDL cholesterol goal below 70 mg percent. Check follow-up carotid and trans-and Doppler studies to follow his known vertebral stenosis. He will return for follow-up in 2 months or call earlier if necessary Alzheimer Disease Alzheimer disease is a mental disorder. It causes memory loss and loss of other mental functions, such as learning, thinking, problem solving, communicating, and completing tasks. The mental losses interfere with the ability to perform daily activities at work, at home, or in social situations. Alzheimer disease usually starts in a person's late 67s or early 97s but can start earlier in life (familial form). The mental changes caused by this disease are permanent and worsen over time. As the illness progresses, the ability to do even the simplest things is lost. Survival with Alzheimer disease ranges from several years to as long as 20 years. CAUSES Alzheimer disease is caused by abnormally high levels of a protein (beta-amyloid) in the brain. This protein forms very small deposits within and around the brain's nerve cells. These deposits prevent the nerve cells from working properly. Experts are not certain what causes the beta-amyloid deposits in this disease. RISK FACTORS The following major risk factors have been identified:  Increasing age.  Certain genetic variations, such as Down syndrome (trisomy 21). SYMPTOMS In the  early stages of Alzheimer disease, you are still able to perform daily activities but need greater effort, more time, or memory aids. Early symptoms include:  Mild memory loss of recent events, names, or phone numbers.  Loss of objects.  Minor loss of vocabulary.  Difficulty with complex tasks, such as paying bills or driving in unfamiliar locations. Other mental functions deteriorate as the disease worsens. These changes slowly go from mild to severe. Symptoms at this stage include:  Difficulty remembering. You may not be able to recall personal information such as your address and telephone number. You may become confused about the date, the season of the year, or your location.  Difficulty maintaining attention. You may forget what you wanted to say during conversations and repeat what you have already said.  Difficulty learning new information or tasks. You may not remember what you read or the name of a new friend you met.  Difficulty counting or doing math. You may have difficulty with complex math problems. You may make mistakes in paying bills or managing your checkbook.  Poor reasoning and judgment. You may make poor decisions or not dress right for the weather.  Difficulty communicating. You may have regular difficulty remembering words, naming objects, expressing yourself clearly, or writing sentences that make sense.  Difficulty performing familiar daily activities. You may get lost driving in familiar locations or need help eating, bathing, dressing, grooming, or using the toilet. You may have difficulty maintaining bladder or bowel control.  Difficulty recognizing familiar faces. You may confuse family members or close friends with one another. You may not recognize a close relative or may mistake strangers for family. Alzheimer disease also may cause changes in personality  and behavior. These changes include:   Loss of interest or motivation.  Social  withdrawal.  Anxiety.  Difficulty sleeping.  Uncharacteristic anger or combativeness.  A false belief that someone is trying to harm you (paranoia).  Seeing things that are not real (hallucinations).  Agitation. Confusion and disruptive behavior are often worse at night and may be triggered by changes in the environment or acute medical issues. DIAGNOSIS  Alzheimer disease is diagnosed through an assessment by your health care provider. During this assessment, your health care provider will do the following:  Ask you and your family, friends, or caregivers questions about your symptoms, their frequency, their duration and progression, and the effect they are having on your life.  Ask questions about your personal and family medical history and use of alcohol or drugs, including prescription medicine.  Perform a physical exam and order blood tests and brain imaging exams. Your health care provider may refer you to a specialist for detailed evaluation of your mental functions (neuropsychological testing).  Many different brain disorders, medical conditions, and certain substances can cause symptoms that resemble Alzheimer disease symptoms. These must be ruled out before this disease can be diagnosed. If Alzheimer disease is diagnosed, it will be considered either "possible" or "probable" Alzheimer disease. "Possible" Alzheimer disease means that your symptoms are typical of the disease and no other disorder is causing them. "Probable" Alzheimer disease means that you also have a family history of the disease or genetic test results that support the diagnosis. Certain tests, mostly used in research studies, are highly specific for Alzheimer disease.  TREATMENT  There is currently no cure for this disease. The goals of treatment are to:  Slow down the progression of the disease.  Preserve mental function as long as possible.  Manage behavioral symptoms.  Make life easier for the person  with Alzheimer disease and his or her caregivers. The following treatment options are available:  Medicine. Certain medicines may help slow memory loss by changing the level of certain chemicals in the brain. Medicine may also help with behavioral symptoms.  Talk therapy. Talk therapy provides education, support, and memory aids for people with this disease. It is most effective in the early stages of the illness.  Caregiving. Caregivers may be family members, friends, or trained medical professionals. They help the person with Alzheimer disease with daily life activities. Caregiving may take place at home or at a nursing facility.  Family support groups. These provide education, emotional support, and information about community resources to family members who are taking care of the person with this disease.   This information is not intended to replace advice given to you by your health care provider. Make sure you discuss any questions you have with your health care provider.   Document Released: 05/20/2004 Document Revised: 09/29/2014 Document Reviewed: 01/14/2013 Elsevier Interactive Patient Education 2016 [place] Disease Caregiver Guide A person who has Alzheimer disease may not be able to take care of himself or herself. He or she may need help with simple tasks. The tips below can help you care for the person. MEMORY LOSS AND CONFUSION If the person is confused or cannot remember things:  Stay calm.  Respond with a short answer.  Avoid correcting him or her in a way that sounds like scolding.  Try not to take it personally, even if he or she forgets your name. BEHAVIOR CHANGES The person may go through behavior changes. This can include depression, anxiety, anger, or  seeing things that are not there. When behavior changes:  Try not to take behavior changes personally.  Stay calm and patient.  Do not argue or try to convince the person about a specific  point.  Know that these changes are part of the disease process. Try to work through it. TIPS TO LESSEN FRUSTRATION  Make appointments and do daily tasks when the person is at his or her best.  Take your time. Simple tasks may take longer. Allow plenty of time to complete tasks.  Limit choices for the person.  Involve the person in what you are doing.  Stick to a routine.  Avoid new or crowded places, if possible.  Use simple words, short sentences, and a calm voice. Only give 1 direction at a time.  Buy clothes and shoes that are easy to put on and take off.  Let people help if they offer. HOME SAFETY  Keep floors clear. Remove rugs, magazine racks, and floor lamps.  Keep hallways well lit.  Put a handrail and nonslip mat in the bathtub or shower.  Put childproof locks on cabinets that have dangerous items in them. These items include medicine, alcohol, guns, toxic cleaning items, sharp tools, matches, or lighters.  Place locks on doors where the person cannot see or reach them. This helps the person to not wander out of the house and get lost.  Be prepared for emergencies. Keep a list of emergency phone numbers and addresses in a handy area. PLANS FOR THE FUTURE   Talk about finances.  Talk about money management. People with Alzheimer disease have trouble managing their money as the disease gets worse.  Get help from professional advisors about financial and legal matters.  Talk about future care.  Choose a power of attorney. This is someone who can make decisions for the person with Alzheimer disease when he or she can no longer do so.  Talk about driving and when it is the right time to stop. The person's doctor can help with this.  If the person lives alone, make sure he or she is safe. Some people need extra help at home. Other people need more care at a nursing home or care center. SUPPORT GROUPS  Some benefits of joining a support group include:    Learning ways to manage stress.  Sharing experiences with others.  Getting emotional comfort and support.  Learning new caregiving skills as the disease progresses.  Knowing what community resources are available and taking advantage of them. GET HELP IF:  The person has a fever.  The person has a sudden behavior change that does not get better with calming strategies.  The person is unable to manage his or her living situation.  The person threatens you or anyone else, including himself or herself.  You are no longer able to care for the person.   This information is not intended to replace advice given to you by your health care provider. Make sure you discuss any questions you have with your health care provider.   Document Released: 12/01/2011 Document Revised: 09/29/2014 Document Reviewed: 12/01/2011 Elsevier Interactive Patient Education [employer].

## 2016-01-17 LAB — DEMENTIA PANEL
Homocysteine: 11 umol/L (ref 0.0–15.0)
RPR Ser Ql: NONREACTIVE
TSH: 3.13 u[IU]/mL (ref 0.450–4.500)
VITAMIN B 12: 448 pg/mL (ref 211–946)

## 2016-01-18 ENCOUNTER — Other Ambulatory Visit: Payer: Self-pay

## 2016-01-18 ENCOUNTER — Telehealth: Payer: Self-pay | Admitting: Neurology

## 2016-01-18 DIAGNOSIS — G301 Alzheimer's disease with late onset: Principal | ICD-10-CM

## 2016-01-18 DIAGNOSIS — F0280 Dementia in other diseases classified elsewhere without behavioral disturbance: Secondary | ICD-10-CM

## 2016-01-18 MED ORDER — MEMANTINE HCL 28 X 5 MG & 21 X 10 MG PO TABS: ORAL_TABLET | ORAL | Status: DC

## 2016-01-18 NOTE — Telephone Encounter (Signed)
Rn call pharmacy and namenda was sent again. It was receive by the pharmacy.

## 2016-01-18 NOTE — Telephone Encounter (Addendum)
Patient's wife is calling regarding the patient. The patient saw Dr. Pearlean BrownieSethi this past Wednesday and a new medication memantine (NAMENDA TITRATION PAK) tablet pack was to be called to [facility] in [place] but they say they never received it. Can it be sent again please?

## 2016-01-24 ENCOUNTER — Ambulatory Visit (INDEPENDENT_AMBULATORY_CARE_PROVIDER_SITE_OTHER): Payer: Medicare Other

## 2016-01-24 DIAGNOSIS — I6501 Occlusion and stenosis of right vertebral artery: Secondary | ICD-10-CM

## 2016-01-29 ENCOUNTER — Telehealth: Payer: Self-pay | Admitting: Neurology

## 2016-01-29 NOTE — Telephone Encounter (Signed)
Pt's wife called requesting results of doppler. Please call and advise [phone]

## 2016-01-30 DIAGNOSIS — N401 Enlarged prostate with lower urinary tract symptoms: Secondary | ICD-10-CM | POA: Diagnosis not present

## 2016-01-30 DIAGNOSIS — Z0000 Encounter for general adult medical examination without abnormal findings: Secondary | ICD-10-CM | POA: Diagnosis not present

## 2016-01-30 DIAGNOSIS — R3915 Urgency of urination: Secondary | ICD-10-CM | POA: Diagnosis not present

## 2016-01-30 NOTE — Telephone Encounter (Signed)
It is read Ask andrea for a copy

## 2016-01-30 NOTE — Telephone Encounter (Signed)
Rn call patients and talk to his wife and him about the doppler results. Rn stated per Dr. Pearlean BrownieSethi the doppler showed low numbers in the blood vessels in back of head. Per Dr.Sethi nothing to worry about. Pt and his wife verbalized understanding.

## 2016-02-08 ENCOUNTER — Ambulatory Visit (INDEPENDENT_AMBULATORY_CARE_PROVIDER_SITE_OTHER): Payer: Medicare Other | Admitting: Diagnostic Neuroimaging

## 2016-02-08 DIAGNOSIS — G301 Alzheimer's disease with late onset: Principal | ICD-10-CM

## 2016-02-08 DIAGNOSIS — R410 Disorientation, unspecified: Secondary | ICD-10-CM

## 2016-02-08 DIAGNOSIS — F0280 Dementia in other diseases classified elsewhere without behavioral disturbance: Secondary | ICD-10-CM

## 2016-02-11 NOTE — Procedures (Signed)
   [facility]  EEG (ELECTROENCEPHALOGRAM) REPORT   STUDY DATE: 02/08/16 PATIENT NAME: Daniel Morrow DOB: 08/17/1941 MRN: [id number]  ORDERING CLINICIAN: Delia HeadyPramod Sethi, MD   TECHNOLOGIST: Gearldine ShownLorraine Jones  TECHNIQUE: Electroencephalogram was recorded utilizing standard 10-20 system of lead placement and reformatted into average and bipolar montages.  RECORDING TIME: 31 minutes ACTIVATION: hyperventilation and photic stimulation  CLINICAL INFORMATION: 75 year old male with intermittent confusion.   FINDINGS: Background rhythms of 9-10 hertz and 30-40 microvolts. No focal, lateralizing, epileptiform activity or seizures are seen. Patient recorded in the awake and drowsy state. EKG channel shows regular rhythm of 70-80 beats per minute.   IMPRESSION:  Normal EEG in the awake and drowsy states.    INTERPRETING PHYSICIAN:  Suanne MarkerVIKRAM R. Saga Balthazar, MD Certified in Neurology, Neurophysiology and Neuroimaging  [facility] [address], [address] [place], [place] [address] [phone]

## 2016-02-13 ENCOUNTER — Ambulatory Visit: Payer: Self-pay | Admitting: Neurology

## 2016-02-13 DIAGNOSIS — E1122 Type 2 diabetes mellitus with diabetic chronic kidney disease: Secondary | ICD-10-CM | POA: Diagnosis not present

## 2016-02-13 DIAGNOSIS — N182 Chronic kidney disease, stage 2 (mild): Secondary | ICD-10-CM | POA: Diagnosis not present

## 2016-02-13 DIAGNOSIS — E1142 Type 2 diabetes mellitus with diabetic polyneuropathy: Secondary | ICD-10-CM | POA: Diagnosis not present

## 2016-02-13 DIAGNOSIS — E1165 Type 2 diabetes mellitus with hyperglycemia: Secondary | ICD-10-CM | POA: Diagnosis not present

## 2016-02-19 ENCOUNTER — Telehealth: Payer: Self-pay

## 2016-02-19 NOTE — Telephone Encounter (Signed)
Rn call [facility] to call in prescription for 10mg  of namenda twice a day. Rn told pharmacy patient is done with the titration.pack. Rn gave verbal order with Dr. Pearlean BrownieSethi. For 180 pills no refills. Pt has appt in July 2017.

## 2016-02-19 NOTE — Telephone Encounter (Signed)
-----   Message from Micki RileyPramod S Sethi, MD sent at 02/15/2016  3:49 PM EDT ----- Daniel RoachKindly inform the patient that EEG study was normal.

## 2016-02-19 NOTE — Telephone Encounter (Signed)
Rn call patients wife on DPR form that her husbands EEG was normal.

## 2016-02-22 DIAGNOSIS — E1142 Type 2 diabetes mellitus with diabetic polyneuropathy: Secondary | ICD-10-CM | POA: Diagnosis not present

## 2016-03-27 ENCOUNTER — Encounter: Payer: Self-pay | Admitting: Neurology

## 2016-03-27 ENCOUNTER — Ambulatory Visit (INDEPENDENT_AMBULATORY_CARE_PROVIDER_SITE_OTHER): Payer: Medicare Other | Admitting: Neurology

## 2016-03-27 VITALS — BP 145/76 | HR 71 | Ht 70.0 in | Wt 211.6 lb

## 2016-03-27 DIAGNOSIS — R451 Restlessness and agitation: Secondary | ICD-10-CM | POA: Diagnosis not present

## 2016-03-27 MED ORDER — DIVALPROEX SODIUM ER 500 MG PO TB24: 500.0000 mg | ORAL_TABLET | Freq: Every day | ORAL | Status: DC

## 2016-03-27 NOTE — Patient Instructions (Addendum)
I had a long discussion with the patient and his wife regarding his dementia with behavioral agitation and recommend he try Depakote ER 500 mg daily for both agitation and depression. Continue  Namenda in  the current dosages for his dementia. Patient also has mild left hand tremor which may be early Parkinson's but this does not appear to be functionally disabling and hence we'll hold off on specific medications for the present time. Patient will continue on Aggrenox for stroke prevention and maintain strict control of hypertension with blood pressure goal below 130/90 and lipids with LDL cholesterol goal below 70 mg percent. Patient may also possibly consider participation in the CREAD dementia trial if interested. He will be given written information to review at home and decide. Return for follow-up in 3 months or call earlier if necessary.

## 2016-03-27 NOTE — Progress Notes (Signed)
[facility] [address] [place]. [place] [address] [phone]  OFFICE FOLLOW-UP NOTE  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 Vascular risk factors of diabetes, hypertension, hyperlipidemia and mild obesity. 05/18/2013 Today he returns for followup after last visit on 08/24/2012. She continues to do well without any recurrent stroke or TIA symptoms. Historically the Aggrenox well without headaches or side effects. He and his wife are in the donor pool and I worried about the cost but like to continue it since she's done so well. He states his sugar is under good control and blood pressure is doing fine it is 137/77 one in office today. He is not sure when he had his last lipid profile checked but apparently it was fine. He has been having mild short-term memory difficulties which are long-standing and not progressive. He has no new complaints today. Update 01/15/2016 : Patient returns for follow-up today after last visit more than 2 and half years ago. Is accompanied by his daughter. Patient was seen in image and 0 month 12/12/68 and 1 episode of confusion and his orientation. He woke up that day and could not remember events for the past few days and was quite confused. He also complained of posterior neck pain. He had lifted some weights the day before as there was some damage to his roof from his stump. The patient's daughter is accompanying him today states that he is had some mild intermittent confusion, short-term memory issues as well as more recently word finding difficulties in completing sentences. He is living at home with his wife. He had positive cerebellar infarct 3 years ago and since  then did have mild short-term memory difficulties. He was in fact started on Aricept 5 mg which he seems to tolerating well but family has noticed some fine action tremor of his hands. Initially the thought this was related to his depression medication which has been discontinued but the tremors have persisted. The tremors in both hands more in the left they're absent at rest. It did not interfere with his day-to-day activities. He has not had any trouble with gait, balance, drooling of saliva or bradykinesia. Patient recently had an upper respiratory tract infection and cough and when seen in the emergency room on 3/23//2017 and his oxygen levels were found to be low. He improved after giving oxygen as well as inhalers. MRI scan of the brain was obtained which I personally reviewed shows mild generalized atrophy and old cerebellar infarct no acute findings were noted. Urine analysis was normal and CBC and comprehensive metabolic panel labs were unremarkable. Patient remains on Aggrenox for stroke prevention but states that it is too expensive and would like to switch to a cheaper alternative. His blood pressure is well controlled and today it is 131/71. He is tolerating Lipitor well without muscle aches and pains. He states his blood sugars have all been under good control. Patient denies any recent falls or head injury. No history of seizures. There is no family history of dementia. He has not had any unsafe behavior, agitation, hallucinations, delusions. Update 03/27/2016 : He returns for follow-up after last visit 2 and half months ago. He is accompanied by his wife who states that he continues to have cognitive difficulties with short-term memory, remembering recent events and names. He was  unable to tolerate Aricept which was discontinued and has been able to tolerated Namenda 10 mg twice daily. Wife feels that he is depressed a lot and is not socially outgoing. He gets disoriented from time to time. He is had  some anger issues and agitation as well. Patient has also been having disturbed sleep and wife suspect that his sleep talking. He had 2 instances of urination on the bathroom mat. Patient has mild tremors in his left hand. He was started on depression medication by his primary physician but it did not help and was discontinued. He does have mild drooling and has overall slowdown in his activities. His balance and gait are good and he is not had any falls. There have been no hallucinations or delusions. [name] decided not to discontinued Aggrenox and he is still on it. ROS:  14 system review of systems is positive for  drooling, and diarrhea, sleepwalking, memory loss, depression, agitation, depression hand tremor and all other systems negative PMH:  Past Medical History  Diagnosis Date  . Hypertension   . Diabetes mellitus   . Hyperlipidemia Cerebellar infarct 2013      Social History:  History   Social History  . Marital Status: Married    Spouse Name: Daniel Morrow    Number of Children: 3  . Years of Education: College   Occupational History  . Retired    Social History Main Topics  . Smoking status: Never Smoker   . Smokeless tobacco: Never Used  . Alcohol Use: No  . Drug Use: No  . Sexual Activity: Not on file   Other Topics Concern  . Not on file   Social History Narrative   Patient lives at home with family.   Caffeine Use: 2 cups daily       Medication Sig Dispense Refill                                        No current facility-administered medications on file prior to visit.    Allergies: No Known Allergies  Physical Exam General: well developed, well nourished Elderly Caucasian male, seated, in no evident distress Head: head normocephalic and atraumatic.   Neck: supple with no carotid or supraclavicular bruits Cardiovascular: regular rate and  rhythm, no murmurs Musculoskeletal: no deformity Skin: no rash/petichiae Vascular: Normal pulses all extremities Filed Vitals:   05/18/13 1508  BP: 137/71  Pulse: 93  Temp: 98 F (36.7 C)    Neurologic Exam Mental Status: Awake and fully alert. Oriented to place and time. Recent and remote memory intact. Attention span, concentration and fund of knowledge appropriate. Mood and affect appropriate. Mini-Mental status exam scored 30/30 without deficits. Clock drawing 4/4. Animal naming 9. Cranial Nerves: Fundoscopic exam reveals sharp disc margins. Pupils equal, briskly reactive to light. Extraocular movements full without nystagmus. Visual fields full to confrontation. Hearing intact. Facial sensation intact. Face, tongue, palate moves normally and symmetrically.  Motor: Normal bulk and tone. Normal strength in all tested extremity muscles. Mild intermittent fine tremor of the left hand while walking and turning with slight diminished left arm swing. Glabellar tap is positive. Slight decreased facial expression but able to get up from a chair easily with arms folded across. Sensory.: intact to touch and pinprick and vibratory sensation.  Coordination: Rapid alternating movements normal in all extremities. Finger-to-nose and heel-to-shin performed accurately bilaterally. Gait and Station:  Arises from chair without difficulty. Stance is normal. Gait demonstrates normal stride length and balance . Able to heel, toe and tandem walk with minimal difficulty.  Reflexes: 1+ and symmetric. Toes downgoing.    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 Vascular risk factors of diabetes, hypertension, hyperlipidemia and mild obesity. Persistent concerns about cognitive impairment and confusion episodes from early dementia Left upper extremity  fine action tremors likely senile versus   parkinsonian   PLAN:    I had a long discussion with the patient and his wife regarding his dementia with behavioral agitation and recommend he try Depakote ER 500 mg daily for both agitation and depression. Continue  Namenda in  the current dosages for his dementia. Patient also has mild left hand tremor which may be early Parkinson's but this does not appear to be functionally disabling and hence we'll hold off on specific medications for the present time. Patient will continue on Aggrenox for stroke prevention and maintain strict control of hypertension with blood pressure goal below 130/90 and lipids with LDL cholesterol goal below 70 mg percent. Patient may also possibly consider participation in the CREAD dementia trial if interested. He will be given written information to review at home and decide. Return for follow-up in 3 months or call earlier if necessary.  Delia HeadyPramod Kevontae Burgoon, MD  [facility] [address] [place], [place] [id number]  Phone [phone] Fax [phone]   03/27/2016 11:17 AM

## 2016-03-31 ENCOUNTER — Telehealth: Payer: Self-pay

## 2016-03-31 NOTE — Telephone Encounter (Signed)
Rn call [facility] about if they had a keppra ER on file for patient. Jasmine with [facility] stated they dont have keppra prescription on file. Pt did get the new prescription of depakote and it was fill 03/27/2016. Rn will call [employer] on this issue.

## 2016-03-31 NOTE — Telephone Encounter (Signed)
Rn receive a cover my meds request on Keppra ER for patient. Rn look in pt chart and there is no documentation in med list he was even on keppra. Rn call patients wife Daniel Morrow and spoke with her about this. Rn ask if patient was ever on keppra because its not on Dr. Pearlean BrownieSethi last note and not prescribed by him. Daniel Morrow stated he is on depakote and they have the medications. Rn stated a call will be made to the pharmacist and insurance company.

## 2016-03-31 NOTE — Telephone Encounter (Signed)
Rn call [facility] at the number provided [phone]. R[facility] Rn stated pt was never prescribed keppra  Er 500mg . Shantel stated with the information provided they only see Depakote 500 mg prescribed and approve by pts plan. Rn stated a PA, and formulary tier exception was sent. Rn also stated there is no date or cover sheet on what [employer] rep that sent it. Shantel stated she will contact her other coworkers who send the forms out to find out. Rn stated Dr. Pearlean BrownieSethi never prescribed the medication, and pharmacy does not have it on file. Shantel was given [facility] number to return call tomorrow.

## 2016-04-01 ENCOUNTER — Telehealth: Payer: Self-pay | Admitting: Neurology

## 2016-04-01 ENCOUNTER — Other Ambulatory Visit: Payer: Self-pay | Admitting: Neurology

## 2016-04-01 DIAGNOSIS — R451 Restlessness and agitation: Secondary | ICD-10-CM

## 2016-04-01 MED ORDER — SERTRALINE HCL 50 MG PO TABS: 50.0000 mg | ORAL_TABLET | Freq: Every day | ORAL | Status: DC

## 2016-04-01 NOTE — Telephone Encounter (Signed)
Patient's wife is calling and states she is not sure why Dr.Sethi put her husband on Rx divalproex 500 mg 24 hr tablets.  She wanted him put on an anti-depressant and states she doesn't feel this medication addresses any of his needs.  Please call.  Thanks!

## 2016-04-01 NOTE — Telephone Encounter (Signed)
Rn call patients wife about her husband taking divalproex 500mg  . Pt has look on line per internet and state it does not take say anything about depression or ani-depressant usage. Pts wife stated when she look on line the medication only stated it was for headaches, seizures. PTs wife stated her husband was on zolof but it was discontinue because they think it cause tremors. Pts wife ask could if be discontinue. Rn stated Dr.Sethi would have to make the decision if he would ike to change it to another medication. Pts wife verbalized understanding.

## 2016-04-04 NOTE — Telephone Encounter (Signed)
I called the patient and his wife on 04/01/16 and after discussion changed him to Zoloft 50 mg daily

## 2016-04-07 ENCOUNTER — Ambulatory Visit: Payer: Self-pay | Admitting: Neurology

## 2016-04-21 DIAGNOSIS — I10 Essential (primary) hypertension: Secondary | ICD-10-CM | POA: Diagnosis not present

## 2016-04-21 DIAGNOSIS — K219 Gastro-esophageal reflux disease without esophagitis: Secondary | ICD-10-CM | POA: Diagnosis not present

## 2016-04-21 DIAGNOSIS — R413 Other amnesia: Secondary | ICD-10-CM | POA: Diagnosis not present

## 2016-05-04 DIAGNOSIS — I10 Essential (primary) hypertension: Secondary | ICD-10-CM | POA: Diagnosis not present

## 2016-05-04 DIAGNOSIS — Z7984 Long term (current) use of oral hypoglycemic drugs: Secondary | ICD-10-CM | POA: Insufficient documentation

## 2016-05-04 DIAGNOSIS — E119 Type 2 diabetes mellitus without complications: Secondary | ICD-10-CM | POA: Insufficient documentation

## 2016-05-04 DIAGNOSIS — R002 Palpitations: Secondary | ICD-10-CM | POA: Insufficient documentation

## 2016-05-04 DIAGNOSIS — Z7982 Long term (current) use of aspirin: Secondary | ICD-10-CM | POA: Insufficient documentation

## 2016-05-04 DIAGNOSIS — Z79899 Other long term (current) drug therapy: Secondary | ICD-10-CM | POA: Diagnosis not present

## 2016-05-04 DIAGNOSIS — Z8673 Personal history of transient ischemic attack (TIA), and cerebral infarction without residual deficits: Secondary | ICD-10-CM | POA: Insufficient documentation

## 2016-05-04 NOTE — ED Triage Notes (Signed)
The pt is c/o a fluttering in his chest siknce lunch yesterday no pain no sob he just wanted to get checked out no pain no sob

## 2016-05-05 ENCOUNTER — Encounter (HOSPITAL_COMMUNITY): Payer: Self-pay | Admitting: *Deleted

## 2016-05-05 ENCOUNTER — Emergency Department (HOSPITAL_COMMUNITY)
Admission: EM | Admit: 2016-05-05 | Discharge: 2016-05-05 | Disposition: A | Discharge status: Home / Self Care | Payer: Medicare Other | Attending: Emergency Medicine | Admitting: Emergency Medicine

## 2016-05-05 ENCOUNTER — Emergency Department (HOSPITAL_COMMUNITY): Payer: Medicare Other

## 2016-05-05 DIAGNOSIS — R002 Palpitations: Secondary | ICD-10-CM | POA: Diagnosis not present

## 2016-05-05 LAB — CBC
HCT: 38.6 % — ABNORMAL LOW (ref 39.0–52.0)
HEMOGLOBIN: 11.9 g/dL — AB (ref 13.0–17.0)
MCH: 27.2 pg (ref 26.0–34.0)
MCHC: 30.8 g/dL (ref 30.0–36.0)
MCV: 88.3 fL (ref 78.0–100.0)
PLATELETS: 248 10*3/uL (ref 150–400)
RBC: 4.37 MIL/uL (ref 4.22–5.81)
RDW: 14.3 % (ref 11.5–15.5)
WBC: 5.7 10*3/uL (ref 4.0–10.5)

## 2016-05-05 LAB — BASIC METABOLIC PANEL
Anion gap: 8 (ref 5–15)
BUN: 18 mg/dL (ref 6–20)
CHLORIDE: 101 mmol/L (ref 101–111)
CO2: 28 mmol/L (ref 22–32)
CREATININE: 1.1 mg/dL (ref 0.61–1.24)
Calcium: 9.2 mg/dL (ref 8.9–10.3)
GFR calc non Af Amer: >60 mL/min (ref >60)
GLUCOSE: 240 mg/dL — AB (ref 65–99)
Potassium: 4 mmol/L (ref 3.5–5.1)
Sodium: 137 mmol/L (ref 135–145)

## 2016-05-05 LAB — TROPONIN I: Troponin I: <0.03 ng/mL (ref <0.03)

## 2016-05-05 MED ORDER — LIDOCAINE HCL (PF) 1 % IJ SOLN: 30.0000 mL | Freq: Once | INTRAMUSCULAR | Status: DC

## 2016-05-05 NOTE — ED Provider Notes (Signed)
[facility]-EMERGENCY DEPT Provider Note   CSN: [id number] Arrival date & time: 05/04/16  2340  First Provider Contact: First MD Initiated Contact with Patient 05/05/16 0041    By signing my name below, I, Rosario AdieWilliam Andrew Hiatt, attest that this documentation has been prepared under the direction and in the presence of Shon Batonourtney F Jettie Lazare, MD. Electronically Signed: Rosario AdieWilliam Andrew Hiatt, ED Scribe. 05/05/16. 12:44 AM.  History   Chief Complaint Chief Complaint  Patient presents with  . Irregular Heart Beat   The history is provided by the patient and the spouse. No language interpreter was used.   HPI Comments: Daniel Morrow is a 75 y.o. male with a PMHx of DM, HLD, HTN, CVA, and alzheimer's disease, who presents to the Emergency Department complaining of sudden onset, unchanged, intermittent episodes of sensation of palpitations onset today at lunchtime PTA. He states that he is not having an episode while in the ED, and there are no known exacerbating factors to bring on the palpitations. No hx of similar sx. No hx of heart disease or irregular heartbeats. He is not followed by a Cardiologist currently. NKDA. Pt denies CP, SOB, nausea, vomiting, leg swelling, or any other associated symptoms.  Past Medical History:  Diagnosis Date  . Diabetes mellitus   . Hyperlipidemia   . Hypertension   . Stroke [facility](HCC)    Patient Active Problem List   Diagnosis Date Noted  . Alzheimer's disease 01/16/2016  . Epididymo-orchitis 12/02/2013  . Orchitis and epididymitis 12/02/2013  . Mild cognitive impairment, so stated 02/17/2013  . CVA (cerebral infarction) 02/17/2013  . 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 Hypertension 04/03/2012  . Diabetes mellitus (HCC) 04/03/2012  . Hyperlipidemia    Past Surgical History:  Procedure Laterality Date  . KNEE ARTHROSCOPY    .  NECK SURGERY      Home Medications    Prior to Admission medications   Medication Sig Start Date End Date Taking? Authorizing Provider  albuterol (PROVENTIL) (2.5 MG/3ML) 0.083% nebulizer solution Take 3 mLs (2.5 mg total) by nebulization every 4 (four) hours as needed for wheezing or shortness of breath. 12/13/15  Yes Geoffery Lyonsouglas Delo, MD  aspirin EC 325 MG tablet Take 1 tablet (325 mg total) by mouth daily. 01/16/16  Yes Micki RileyPramod S Sethi, MD  atorvastatin (LIPITOR) 40 MG tablet Take 40 mg by mouth daily.   Yes Historical Provider, MD  benazepril (LOTENSIN) 10 MG tablet Take 10 mg by mouth daily.   Yes Historical Provider, MD  divalproex (DEPAKOTE ER) 500 MG 24 hr tablet Take 1 tablet (500 mg total) by mouth daily. 03/27/16  Yes Micki RileyPramod S Sethi, MD  glimepiride (AMARYL) 2 MG tablet Take 2 mg by mouth daily. 12/05/15  Yes Historical Provider, MD  hydrochlorothiazide (HYDRODIURIL) 25 MG tablet Take 25 mg by mouth daily.   Yes Historical Provider, MD  memantine (NAMENDA) 10 MG tablet Take 10 mg by mouth daily.  02/19/16  Yes Historical Provider, MD  metFORMIN (GLUCOPHAGE) 1000 MG tablet Take 1,000 mg by mouth daily with breakfast.  02/13/16  Yes Historical Provider, MD  Multiple Vitamin (MULTIVITAMIN WITH MINERALS) TABS tablet Take 1 tablet by mouth daily.   Yes Historical Provider, MD  pantoprazole (PROTONIX) 40 MG tablet Take 40 mg by mouth daily.   Yes Historical Provider, MD  repaglinide (PRANDIN) 1 MG tablet Take 2 mg by mouth daily. 10/17/15  Yes Historical Provider, MD  sertraline (ZOLOFT) 50 MG tablet Take 1 tablet (50 mg total) by mouth daily. 04/01/16  Yes Micki Riley, MD  sitaGLIPtan-metformin (JANUMET) 50-1000 MG per tablet Take 1 tablet by mouth 2 (two) times daily with a meal.   Yes Historical Provider, MD   Family History No family history on file.  Social History Social History  Substance Use Topics  . Smoking status: Never Smoker  . Smokeless tobacco: Never Used  . Alcohol use No    Allergies   Review of patient's allergies indicates no known allergies.  Review of Systems Review of Systems  Constitutional: Negative for fever.  Respiratory: Negative for shortness of breath.   Cardiovascular: Positive for palpitations. Negative for chest pain and leg swelling.  Gastrointestinal: Negative for nausea and vomiting.  All other systems reviewed and are negative.  Physical Exam Updated Vital Signs BP 153/75   Pulse 62   Temp 98.1 F (36.7 C) (Oral)   Resp 22   Ht 5\' 10"  (1.778 m)   Wt 210 lb (95.3 kg)   SpO2 95%   BMI 30.13 kg/m   Physical Exam  Constitutional: He is oriented to person, place, and time. He appears well-developed and well-nourished.  HENT:  Head: Normocephalic and atraumatic.  Cardiovascular: Normal rate, regular rhythm, normal heart sounds and intact distal pulses.   No murmur heard. Pulmonary/Chest: Effort normal and breath sounds normal. No respiratory distress. He has no wheezes. He has no rales.  Abdominal: Soft. Bowel sounds are normal. He exhibits no distension. There is no tenderness.  Musculoskeletal: Normal range of motion. He exhibits no edema.  Neurological: He is alert and oriented to person, place, and time.  Skin: Skin is warm and dry.  Psychiatric: He has a normal mood and affect. His behavior is normal.  Nursing note and vitals reviewed.  ED Treatments / Results  DIAGNOSTIC STUDIES: Oxygen Saturation is 97% on RA, normal by my interpretation.   COORDINATION OF CARE: 12:44 AM-Discussed next steps with pt. Pt verbalized understanding and is agreeable with the plan.   Labs (all labs ordered are listed, but only abnormal results are displayed) Labs Reviewed  BASIC METABOLIC PANEL - Abnormal; Notable for the following:       Result Value   Glucose, Bld 240 (*)    All other components within normal limits  CBC - Abnormal; Notable for the following:    Hemoglobin 11.9 (*)    HCT 38.6 (*)    All other components  within normal limits  TROPONIN I   EKG  EKG Interpretation  Date/Time:  Sunday May 04 2016 23:51:33 EDT Ventricular Rate:  62 PR Interval:  176 QRS Duration: 84 QT Interval:  434 QTC Calculation: 440 R Axis:   57 Text Interpretation:  Normal sinus rhythm Septal infarct , age undetermined Abnormal ECG Confirmed by Ridhi Hoffert  MD, Romonda Parker ([id number]) on 05/05/2016 12:13:14 AM       EKG Interpretation  Date/Time:  Monday May 05 2016 00:44:38 EDT Ventricular Rate:  60 PR Interval:  176 QRS Duration: 99 QT Interval:  455 QTC Calculation: 455 R Axis:   66 Text Interpretation:  Sinus rhythm Anteroseptal infarct, age indeterminate Baseline wander Confirmed by Jammy Stlouis  MD, Jizel Cheeks ([id number]) on 05/05/2016 12:52:41 AM      Radiology Dg Chest 2 View  Result Date: 05/05/2016 CLINICAL DATA:  Heart fluttering today.  History of CHF. EXAM: CHEST  2 VIEW COMPARISON:  12/13/2015 FINDINGS: Linear atelectasis or fibrosis  in the lung bases is similar to prior study. No focal consolidation or airspace disease today. No blunting of costophrenic angles. No pneumothorax. Normal heart size and pulmonary vascularity. Mediastinal contours appear intact. Tortuous aorta. Degenerative changes in the spine. IMPRESSION: Linear fibrosis or atelectasis in the lung bases is unchanged since prior study. No evidence of active pulmonary disease. Electronically Signed   By: Burman NievesWilliam  Stevens M.D.   On: 05/05/2016 00:35   Procedures Procedures (including critical care time)  Medications Ordered in ED Medications - No data to display   Initial Impression / Assessment and Plan / ED Course  I have reviewed the triage vital signs and the nursing notes.  Pertinent labs & imaging results that were available during my care of the patient were reviewed by me and considered in my medical decision making (see chart for details).  Clinical Course    Patient presents with palpitations. This is been ongoing for one day.  Currently asymptomatic. Denies chest pain or shortness of breath. Nontoxic. EKG without evidence of arrhythmia. Patient has had no episodes while in the ER cardiac monitoring has been uneventful. Lab work including metabolites and troponin are negative. We'll have patient follow-up with cardiology for further evaluation and possible Holter monitoring. Avoid caffeine.  After history, exam, and medical workup I feel the patient has been appropriately medically screened and is safe for discharge home. Pertinent diagnoses were discussed with the patient. Patient was given return precautions.   Final Clinical Impressions(s) / ED Diagnoses   Final diagnoses:  Palpitations    New Prescriptions New Prescriptions   No medications on file   I personally performed the services described in this documentation, which was scribed in my presence. The recorded information has been reviewed and is accurate.     Shon Batonourtney F Jayron Maqueda, MD 05/05/16 [phone]

## 2016-05-05 NOTE — Discharge Instructions (Signed)
You were seen today for palpitations. Your workup is reassuring. You need follow-up with cardiology for possible Holter monitoring.

## 2016-05-12 DIAGNOSIS — H524 Presbyopia: Secondary | ICD-10-CM | POA: Diagnosis not present

## 2016-05-12 DIAGNOSIS — H47292 Other optic atrophy, left eye: Secondary | ICD-10-CM | POA: Diagnosis not present

## 2016-05-12 DIAGNOSIS — H40003 Preglaucoma, unspecified, bilateral: Secondary | ICD-10-CM | POA: Diagnosis not present

## 2016-05-14 ENCOUNTER — Encounter: Payer: Self-pay | Admitting: Cardiology

## 2016-05-28 ENCOUNTER — Telehealth: Payer: Self-pay | Admitting: Neurology

## 2016-05-28 ENCOUNTER — Other Ambulatory Visit: Payer: Self-pay | Admitting: Neurology

## 2016-05-28 ENCOUNTER — Encounter: Payer: Self-pay | Admitting: Cardiology

## 2016-05-28 MED ORDER — ASPIRIN-DIPYRIDAMOLE ER 25-200 MG PO CP12: 1.0000 | ORAL_CAPSULE | Freq: Two times a day (BID) | ORAL | 3 refills | Status: DC

## 2016-05-28 NOTE — Telephone Encounter (Signed)
Ok I will prescribe

## 2016-05-28 NOTE — Telephone Encounter (Signed)
Message will be sent to Dr. Pearlean BrownieSethi for order. The aggrenox is not on the medication list. Pts wife wants aggrenox 25/200 mg , one pill twice a day for 30 days only.

## 2016-05-28 NOTE — Telephone Encounter (Signed)
Patient's wife is calling to get a Rx for generic Aggrenox 25/200mg  #30 (1 pill twice a day) called to [facility] in [place] for the patient.  He usually uses mail order but that medication is on back order.

## 2016-06-02 ENCOUNTER — Telehealth: Payer: Self-pay | Admitting: Neurology

## 2016-06-02 DIAGNOSIS — H47292 Other optic atrophy, left eye: Secondary | ICD-10-CM | POA: Diagnosis not present

## 2016-06-02 DIAGNOSIS — H40003 Preglaucoma, unspecified, bilateral: Secondary | ICD-10-CM | POA: Diagnosis not present

## 2016-06-02 NOTE — Telephone Encounter (Signed)
Message sent to Dr. Pearlean BrownieSethi about ophthalmologist would like to speak with him. Thanks

## 2016-06-02 NOTE — Telephone Encounter (Signed)
Coralyn Pearyan Snipes ophthalmologist  called in stating the pt has had some visual changes recently along with increase pressure and depth of field changes . He would like to speak to Dr. Pearlean BrownieSethi about the pt. Please call cell: [phone]

## 2016-06-03 ENCOUNTER — Encounter: Payer: Self-pay | Admitting: Nurse Practitioner

## 2016-06-03 ENCOUNTER — Ambulatory Visit (INDEPENDENT_AMBULATORY_CARE_PROVIDER_SITE_OTHER): Payer: Medicare Other | Admitting: Nurse Practitioner

## 2016-06-03 VITALS — BP 140/74 | HR 80 | Ht 70.0 in | Wt 203.0 lb

## 2016-06-03 DIAGNOSIS — H53132 Sudden visual loss, left eye: Secondary | ICD-10-CM | POA: Diagnosis not present

## 2016-06-03 DIAGNOSIS — G3184 Mild cognitive impairment, so stated: Secondary | ICD-10-CM | POA: Diagnosis not present

## 2016-06-03 DIAGNOSIS — H53139 Sudden visual loss, unspecified eye: Secondary | ICD-10-CM | POA: Insufficient documentation

## 2016-06-03 DIAGNOSIS — H3412 Central retinal artery occlusion, left eye: Secondary | ICD-10-CM | POA: Diagnosis not present

## 2016-06-03 DIAGNOSIS — I10 Essential (primary) hypertension: Secondary | ICD-10-CM | POA: Diagnosis not present

## 2016-06-03 DIAGNOSIS — H3410 Central retinal artery occlusion, unspecified eye: Secondary | ICD-10-CM | POA: Insufficient documentation

## 2016-06-03 DIAGNOSIS — Z5181 Encounter for therapeutic drug level monitoring: Secondary | ICD-10-CM

## 2016-06-03 DIAGNOSIS — E785 Hyperlipidemia, unspecified: Secondary | ICD-10-CM | POA: Diagnosis not present

## 2016-06-03 NOTE — Patient Instructions (Addendum)
Will get CT angio of head and neck Will get BMP  Continue  Namenda Continue Aggrenox twice daily Continue Lipitor  Follow up with 2 months

## 2016-06-03 NOTE — Telephone Encounter (Addendum)
PT was seen by Carolyn(NP) today. Dr.Sethi was consulted by Carolyn(NP).

## 2016-06-03 NOTE — Telephone Encounter (Signed)
Lft vm for Daniel Morrow about any paperwork for patients visual. [facility] or receive by the NP or Dr. Pearlean BrownieSethi. No office notes were sent from eye md.

## 2016-06-03 NOTE — Telephone Encounter (Deleted)
rrr

## 2016-06-03 NOTE — Progress Notes (Signed)
[facility]  PATIENT: Daniel Morrow DOB: 10-19-1940   REASON FOR VISIT: Follow-up for visual loss in left eye, dementia HISTORY FROM:Patient and wife    HISTORY OF PRESENT ILLNESS:UPDATE 06/03/16 CM. [name], 75 year old male returns for acute visit. He was seen in the office for dementia in the past by Dr. Pearlean Brownie.. Wife called in after appointment with ophthalmologist yesterday. Dr. Precious Bard had attempted to call Dr. Pearlean Brownie yesterday with visual changes on patient's exam. He complains of visual loss left eye, uncertain how long this has been going on according to the patient and wife. He has not noted any changes. He denies any double vision or headache. He has a history of stroke in 2013 is currently on Aggrenox. He had an ER visit on 05/05/2016 for palpitations. He has not seen cardiology yet but has an appointment. He also has a history of dementia and is on Namenda twice daily. Denies any recent falls. He denies any hallucinations or delusions. Denies any weakness of one side of the body or the other. Denies any sensory changes. Denies any changes in speech or increased confusion. He returns for reevaluation   Update 03/27/2016 : He returns for follow-up after last visit 2 and half months ago. He is accompanied by his wife who states that he continues to have cognitive difficulties with short-term memory, remembering recent events and names. He was unable to tolerate Aricept which was discontinued and has been able to tolerated Namenda 10 mg twice daily. Wife feels that he is depressed a lot and is not socially outgoing. He gets disoriented from time to time. He is had some anger issues and agitation as well. Patient has also been having disturbed sleep and wife suspect that his sleep talking. He had 2 instances of urination on the bathroom mat. Patient has mild tremors in his left hand. He was started on depression medication by his primary physician but it did not help and was  discontinued. He does have mild drooling and has overall slowdown in his activities. His balance and gait are good and he is not had any falls. There have been no hallucinations or delusions. [name] decided not to discontinued Aggrenox and he is still on it. Update 01/15/2016 : Patient returns for follow-up today after last visit more than 2 and half years ago. Is accompanied by his daughter. Patient was seen in image and 0 month 12/12/68 and 1 episode of confusion and his orientation. He woke up that day and could not remember events for the past few days and was quite confused. He also complained of posterior neck pain. He had lifted some weights the day before as there was some damage to his roof from his stump. The patient's daughter is accompanying him today states that he is had some mild intermittent confusion, short-term memory issues as well as more recently word finding difficulties in completing sentences. He is living at home with his wife. He had positive cerebellar infarct 3 years ago and since then did have mild short-term memory difficulties. He was in fact started on Aricept 5 mg which he seems to tolerating well but family has noticed some fine action tremor of his hands. Initially the thought this was related to his depression medication which has been discontinued but the tremors have persisted. The tremors in both hands more in the left they're absent at rest. It did not interfere with his day-to-day activities. He has not had any trouble with gait, balance,  drooling of saliva or bradykinesia. Patient recently had an upper respiratory tract infection and cough and when seen in the emergency room on 3/23//2017 and his oxygen levels were found to be low. He improved after giving oxygen as well as inhalers. MRI scan of the brain was obtained which I personally reviewed shows mild generalized atrophy and old cerebellar infarct no acute findings were noted. Urine analysis was normal and  CBC and comprehensive metabolic panel labs were unremarkable. Patient remains on Aggrenox for stroke prevention but states that it is too expensive and would like to switch to a cheaper alternative. His blood pressure is well controlled and today it is 131/71. He is tolerating Lipitor well without muscle aches and pains. He states his blood sugars have all been under good control. Patient denies any recent falls or head injury. No history of seizures. There is no family history of dementia. He has not had any unsafe behavior, agitation, hallucinations, delusions. REVIEW OF SYSTEMS: Full 14 system review of systems performed and notable only for those listed, all others are neg:  Constitutional: neg  Cardiovascular: neg Ear/Nose/Throat: neg  Skin: neg Eyes: Vision loss left Respiratory: neg Gastroitestinal: neg  Hematology/Lymphatic: neg  Endocrine: neg Musculoskeletal:neg Allergy/Immunology: neg Neurological: Memory loss Psychiatric: neg Sleep : neg   ALLERGIES: No Known Allergies  HOME MEDICATIONS: Outpatient Medications Prior to Visit  Medication Sig Dispense Refill  . atorvastatin (LIPITOR) 40 MG tablet Take 40 mg by mouth daily.    . benazepril (LOTENSIN) 10 MG tablet Take 10 mg by mouth daily.    Marland Kitchen. dipyridamole-aspirin (AGGRENOX) 200-25 MG 12hr capsule Take 1 capsule by mouth 2 (two) times daily. 60 capsule 3  . divalproex (DEPAKOTE ER) 500 MG 24 hr tablet Take 1 tablet (500 mg total) by mouth daily. 30 tablet 3  . glimepiride (AMARYL) 2 MG tablet Take 2 mg by mouth daily.  5  . memantine (NAMENDA) 10 MG tablet TAKE 1 TABLET BY MOUTH 2 TIMES DAILY. 180 tablet 3  . metFORMIN (GLUCOPHAGE) 1000 MG tablet Take 1,000 mg by mouth daily with breakfast.   2  . Multiple Vitamin (MULTIVITAMIN WITH MINERALS) TABS tablet Take 1 tablet by mouth daily.    . pantoprazole (PROTONIX) 40 MG tablet Take 40 mg by mouth daily.    . sertraline (ZOLOFT) 50 MG tablet Take 1 tablet (50 mg total) by  mouth daily. 30 tablet 3  . albuterol (PROVENTIL) (2.5 MG/3ML) 0.083% nebulizer solution Take 3 mLs (2.5 mg total) by nebulization every 4 (four) hours as needed for wheezing or shortness of breath. (Patient not taking: Reported on 06/03/2016) 30 vial 0  . hydrochlorothiazide (HYDRODIURIL) 25 MG tablet Take 25 mg by mouth daily.    . repaglinide (PRANDIN) 1 MG tablet Take 2 mg by mouth daily.  0  . sitaGLIPtan-metformin (JANUMET) 50-1000 MG per tablet Take 1 tablet by mouth 2 (two) times daily with a meal.     No facility-administered medications prior to visit.     PAST MEDICAL HISTORY: Past Medical History:  Diagnosis Date  . Diabetes mellitus   . Hyperlipidemia   . Hypertension   . Stroke [facility](HCC)     PAST SURGICAL HISTORY: Past Surgical History:  Procedure Laterality Date  . KNEE ARTHROSCOPY    . NECK SURGERY      FAMILY HISTORY: History reviewed. No pertinent family history.  SOCIAL HISTORY: Social History   Social History  . Marital status: Married    Spouse name: Damian Leavellrudy  .  Number of children: 3  . Years of education: College   Occupational History  . Retired    Social History Main Topics  . Smoking status: Never Smoker  . Smokeless tobacco: Never Used  . Alcohol use No  . Drug use: No  . Sexual activity: Not on file   Other Topics Concern  . Not on file   Social History Narrative   Patient lives at home with family.   Caffeine Use: 2 cups daily     PHYSICAL EXAM  Vitals:   06/03/16 1043  BP: 140/74  Pulse: 80  Weight: 203 lb (92.1 kg)  Height: 5\' 10"  (1.778 m)   Body mass index is 29.13 kg/m. General: well developed, well nourished Elderly Caucasian male, seated, in no evident distress Head: head normocephalic and atraumatic.   Neck: supple with no carotid  bruits Cardiovascular: regular rate and rhythm, no murmurs Musculoskeletal: no deformity Skin: no rash/petichiae Vascular: Normal pulses all extremities    Neurological examination    Mentation: Alert oriented to time, place, history taking. MMSE 28/30. AFT 6. Clock drawing 4/4. Attention span and concentration appropriate. .  Follows all commands speech and language fluent.   Cranial nerve II-XII: Fundoscopic exam reveals optic disc pallor on the left. Left pupil slow  to react, right pupil  equal round reactive to light.  extraocular movements were full, visual field were full on confrontational test on the right only. Facial sensation and strength were normal. hearing was intact to finger rubbing bilaterally. Uvula tongue midline. head turning and shoulder shrug were normal and symmetric.Tongue protrusion into cheek strength was normal. Motor: normal bulk and tone, full strength in the BUE, BLE, fine finger movements normal, no pronator drift. No focal weakness Sensory: normal and symmetric to light touch, pinprick, and  Vibin the upper and lower extremities Coordination: finger-nose-finger, heel-to-shin bilaterally, no dysmetria Reflexes 1+ upper lower and symmetric  plantar responses were flexor bilaterally. Gait and Station: Rising up from seated position without assistance, normal stance,  moderate stride, good arm swing, smooth turning, able to perform tiptoe, and heel walking without difficulty. Tandem gait is steady  DIAGNOSTIC DATA (LABS, IMAGING, TESTING) - I reviewed patient records, labs, notes, testing and imaging myself where available.  Lab Results  Component Value Date   WBC 5.7 05/05/2016   HGB 11.9 (L) 05/05/2016   HCT 38.6 (L) 05/05/2016   MCV 88.3 05/05/2016   PLT 248 05/05/2016      Component Value Date/Time   NA 137 05/05/2016 0003   K 4.0 05/05/2016 0003   CL 101 05/05/2016 0003   CO2 28 05/05/2016 0003   GLUCOSE 240 (H) 05/05/2016 0003   BUN 18 05/05/2016 0003   CREATININE 1.10 05/05/2016 0003   CALCIUM 9.2 05/05/2016 0003   PROT 7.2 12/13/2015 1002   ALBUMIN 4.1 12/13/2015 1002   AST 33 12/13/2015 1002   ALT 30 12/13/2015 1002    ALKPHOS 75 12/13/2015 1002   BILITOT 1.2 12/13/2015 1002   GFRNONAA >60 05/05/2016 0003   GFRAA >60 05/05/2016 0003     Lab Results  Component Value Date   VITAMINB12 448 01/16/2016   Lab Results  Component Value Date   TSH 3.130 01/16/2016    ASSESSMENT AND PLAN  75 y.o. year old male  has a past medical history of Diabetes mellitus; Hyperlipidemia; Hypertension; and Stroke [facility]). here to follow-up. 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 We had to call for records.  PLAN: seen in consultation with Dr. Pearlean Brownie Reviewed records from Dr. Precious Bard office Will get CT angio of head and neck Will get BMP  Continue  Namenda Continue Aggrenox twice daily Continue Lipitor  Follow up with 2 months I spent  1 hour  in total face to face time with the patient more than 50% of which was spent counseling and coordination of care, reviewing test results reviewing medications and discussing and reviewing the diagnosis of CRAO and further treatment options. ,Vst time 1 hour  Nilda Riggs, [facility], [facility], APRN  [facility] [address], [address] [place], [place] [address] [phone]

## 2016-06-04 ENCOUNTER — Encounter: Payer: Self-pay | Admitting: Cardiology

## 2016-06-04 ENCOUNTER — Telehealth: Payer: Self-pay | Admitting: *Deleted

## 2016-06-04 DIAGNOSIS — E1142 Type 2 diabetes mellitus with diabetic polyneuropathy: Secondary | ICD-10-CM | POA: Diagnosis not present

## 2016-06-04 DIAGNOSIS — R002 Palpitations: Secondary | ICD-10-CM | POA: Diagnosis not present

## 2016-06-04 DIAGNOSIS — Z7984 Long term (current) use of oral hypoglycemic drugs: Secondary | ICD-10-CM | POA: Diagnosis not present

## 2016-06-04 DIAGNOSIS — E1122 Type 2 diabetes mellitus with diabetic chronic kidney disease: Secondary | ICD-10-CM | POA: Diagnosis not present

## 2016-06-04 DIAGNOSIS — Z23 Encounter for immunization: Secondary | ICD-10-CM | POA: Diagnosis not present

## 2016-06-04 DIAGNOSIS — E1165 Type 2 diabetes mellitus with hyperglycemia: Secondary | ICD-10-CM | POA: Diagnosis not present

## 2016-06-04 DIAGNOSIS — N182 Chronic kidney disease, stage 2 (mild): Secondary | ICD-10-CM | POA: Diagnosis not present

## 2016-06-04 LAB — BASIC METABOLIC PANEL
BUN / CREAT RATIO: 16 (ref 10–24)
BUN: 18 mg/dL (ref 8–27)
CHLORIDE: 102 mmol/L (ref 96–106)
CO2: 26 mmol/L (ref 18–29)
Calcium: 9.7 mg/dL (ref 8.6–10.2)
Creatinine, Ser: 1.1 mg/dL (ref 0.76–1.27)
GFR calc Af Amer: 77 mL/min/{1.73_m2} (ref >59)
GFR calc non Af Amer: 66 mL/min/{1.73_m2} (ref >59)
GLUCOSE: 135 mg/dL — AB (ref 65–99)
Potassium: 5.1 mmol/L (ref 3.5–5.2)
SODIUM: 144 mmol/L (ref 134–144)

## 2016-06-04 NOTE — Telephone Encounter (Signed)
-----   Message from Nilda RiggsNancy Carolyn Martin, NP sent at 06/04/2016  9:02 AM EDT ----- Labs ok please call the patient

## 2016-06-04 NOTE — Telephone Encounter (Signed)
Called home , no answer.  Did not LM wife mobile.  Will call back later.

## 2016-06-05 NOTE — Telephone Encounter (Signed)
Spoke to wife and let her know that lab results were ok.  She verbalized understanding.

## 2016-06-06 ENCOUNTER — Ambulatory Visit: Payer: Medicare Other | Admitting: Cardiology

## 2016-06-06 ENCOUNTER — Ambulatory Visit (INDEPENDENT_AMBULATORY_CARE_PROVIDER_SITE_OTHER): Payer: Medicare Other | Admitting: Cardiology

## 2016-06-06 ENCOUNTER — Encounter: Payer: Self-pay | Admitting: Cardiology

## 2016-06-06 VITALS — BP 122/72 | HR 80 | Ht 70.0 in | Wt 202.8 lb

## 2016-06-06 DIAGNOSIS — I639 Cerebral infarction, unspecified: Secondary | ICD-10-CM

## 2016-06-06 DIAGNOSIS — E785 Hyperlipidemia, unspecified: Secondary | ICD-10-CM

## 2016-06-06 DIAGNOSIS — H5442 Blindness, left eye, normal vision right eye: Secondary | ICD-10-CM | POA: Diagnosis not present

## 2016-06-06 DIAGNOSIS — R002 Palpitations: Secondary | ICD-10-CM

## 2016-06-06 DIAGNOSIS — H5440 Blindness, one eye, unspecified eye: Secondary | ICD-10-CM

## 2016-06-06 NOTE — Patient Instructions (Signed)
Schedule Echo   Schedule 30 day Event Monitor    Your physician recommends that you schedule a follow-up appointment as needed.

## 2016-06-06 NOTE — Progress Notes (Signed)
I agree with the above plan 

## 2016-06-06 NOTE — Progress Notes (Signed)
Cardiology Office Note    Date:  06/06/2016   ID:  Daniel LamerBrantley L Seifer, DOB 10/23/1940, MRN [id number]  PCP:  Lillia MountainGRIFFIN,JOHN JOSEPH, MD  Cardiologist:   Donato SchultzMark Trinten Boudoin, MD     History of Present Illness:  Daniel Morrow is a 75 y.o. male here for the evaluation of palpitations. ER visit 05/05/16 - no adverse rhythms on Tele. Labs normal. Has a history of stroke, DM, HTN, HL, Alzheimer's. Had sensation of palpitations around lunch time. Lasted a few days duration. Seem to manifest as an occasional skipping beat. Did not feel as though his heart was racing out of control. No surrounding fevers, chills, bleeding, orthopnea, chest pain.  No SOB. On Monday, went to [facility] - can not see anything out of left eye. New? Does not remember when this occurred. He was there for a routine visit. Saw Dr. Pearlean BrownieSethi following this visit. Requested monitoring for afib.   Over the past several days however he has not had any further palpitations.  Back in 10/18/2008 he had a nuclear stress test that was low risk, no ischemia, normal EF of 66%  Father died 8163 - MI. Mother had polycythemia. No history of atrial fibrillation family    Past Medical History:  Diagnosis Date  . Diabetes mellitus   . Hyperlipidemia   . Hypertension   . Stroke [facility](HCC)     Past Surgical History:  Procedure Laterality Date  . KNEE ARTHROSCOPY    . NECK SURGERY      Current Medications: Outpatient Medications Prior to Visit  Medication Sig Dispense Refill  . albuterol (PROVENTIL) (2.5 MG/3ML) 0.083% nebulizer solution Take 3 mLs (2.5 mg total) by nebulization every 4 (four) hours as needed for wheezing or shortness of breath. 30 vial 0  . atorvastatin (LIPITOR) 40 MG tablet Take 40 mg by mouth daily.    . benazepril (LOTENSIN) 10 MG tablet Take 10 mg by mouth daily.    Marland Kitchen. dipyridamole-aspirin (AGGRENOX) 200-25 MG 12hr capsule Take 1 capsule by mouth 2 (two) times daily. 60 capsule 3  . divalproex (DEPAKOTE ER) 500 MG 24 hr tablet  Take 1 tablet (500 mg total) by mouth daily. 30 tablet 3  . glimepiride (AMARYL) 2 MG tablet Take 2 mg by mouth daily.  5  . JARDIANCE 10 MG TABS tablet Take 10 mg by mouth daily.   0  . latanoprost (XALATAN) 0.005 % ophthalmic solution Place 1 drop into both eyes at bedtime.   5  . memantine (NAMENDA) 10 MG tablet TAKE 1 TABLET BY MOUTH 2 TIMES DAILY. 180 tablet 3  . metFORMIN (GLUCOPHAGE) 1000 MG tablet Take 1,000 mg by mouth daily with breakfast.   2  . Multiple Vitamin (MULTIVITAMIN WITH MINERALS) TABS tablet Take 1 tablet by mouth daily.    . pantoprazole (PROTONIX) 40 MG tablet Take 40 mg by mouth daily.    . sertraline (ZOLOFT) 50 MG tablet Take 1 tablet (50 mg total) by mouth daily. 30 tablet 3   No facility-administered medications prior to visit.      Allergies:   Review of patient's allergies indicates no known allergies.   Social History   Social History  . Marital status: Married    Spouse name: Damian Leavellrudy  . Number of children: 3  . Years of education: College   Occupational History  . Retired    Social History Main Topics  . Smoking status: Never Smoker  . Smokeless tobacco: Never Used  . Alcohol use  No  . Drug use: No  . Sexual activity: Not Asked   Other Topics Concern  . None   Social History Narrative   Patient lives at home with family.   Caffeine Use: 2 cups daily     Family History:  See above. No early CAD history   ROS:   Please see the history of present illness.    ROS All other systems reviewed and are negative.   PHYSICAL EXAM:   VS:  BP 122/72   Pulse 80   Ht 5\' 10"  (1.778 m)   Wt 202 lb 12.8 oz (92 kg)   BMI 29.10 kg/m    GEN: Well nourished, well developed, in no acute distress  HEENT: normal  Neck: no JVD, carotid bruits, or masses Cardiac: RRR; no murmurs, rubs, or gallops,no edema  Respiratory:  clear to auscultation bilaterally, normal work of breathing GI: soft, nontender, nondistended, + BS MS: no deformity or atrophy    Skin: warm and dry, no rash Neuro:  Alert and Oriented x 3, Strength and sensation are intact, able to answer questions fairly well. Mild dementia. Wife did help with some answers. Psych: euthymic mood, full affect  Wt Readings from Last 3 Encounters:  06/06/16 202 lb 12.8 oz (92 kg)  06/03/16 203 lb (92.1 kg)  05/05/16 210 lb (95.3 kg)      Studies/Labs Reviewed:   EKG:  EKG is ordered today.  The ekg ordered today demonstrates 06/06/16-sinus rhythm, sinus arrhythmia heart rate 71 bpm intervals.  Recent Labs: 12/13/2015: ALT 30; B Natriuretic Peptide 17.4 01/16/2016: TSH 3.130 05/05/2016: Hemoglobin 11.9; Platelets 248 06/03/2016: BUN 18; Creatinine, Ser 1.10; Potassium 5.1; Sodium 144   Lipid Panel    Component Value Date/Time   CHOL 154 04/03/2012 0600   TRIG 140 04/03/2012 0600   HDL 40 04/03/2012 0600   CHOLHDL 3.9 04/03/2012 0600   VLDL 28 04/03/2012 0600   LDLCALC 86 04/03/2012 0600    Additional studies/ records that were reviewed today include:   ECHO: 04/03/12: - Left ventricle: The cavity size was normal. There was mildconcentric hypertrophy. Systolic function was normal. Theestimated ejection fraction was in the range of 55% to60%. Wall motion was normal; there were no regional wallmotion abnormalities. Doppler parameters are consistentwith abnormal left ventricular relaxation (grade 1diastolic dysfunction). - Left atrium: The atrium was mildly dilated.  Hospital records reviewed, lab work reviewed, ER visit reviewed, troponin normal, TSH normal at 3.1, hemoglobin 11.9. Normal electrolytes  ASSESSMENT:    1. Palpitations   2. Cerebrovascular accident (CVA), unspecified mechanism (HCC)   3. Blindness of left eye   4. Hyperlipidemia      PLAN:  In order of problems listed above:  Palpitations  - We will place an event monitor. I want to exclude the possibility of atrial fibrillation especially in light of his left eye blindness which is newly  discovered. He has had a history of prior stroke. Could he have had atrial fibrillation?Marland Kitchen The way he describes intermittent palpitation sensation this could very well be PACs or PVCs.  - Electrolytes TSH normal.  - EKG today shows no abnormalities, sinus rhythm. He has not felt any palpitations since those few days prior to his emergency room visit.  Left eye blindness/stroke  - This was discovered by his eye physician earlier this week. The saw neurology, Dr. Pearlean Brownie following this and he has a CT scan of head and neck ordered. Wife understands the possibility of atrial fibrillation causing embolic phenomenon  or perhaps carotid artery plaque causing blindness. Patient does not recall a specific moment when his left eye vision became diminished. He was there for a routine eye check.  Hyperlipidemia  - With atorvastatin 40 mg high intensity dose especially in light of his prior stroke history.  Diabetes  - Medications reviewed. Per primary team, Dr. Valentina Lucks    Medication Adjustments/Labs and Tests Ordered: Current medicines are reviewed at length with the patient today.  Concerns regarding medicines are outlined above.  Medication changes, Labs and Tests ordered today are listed in the Patient Instructions below. Patient Instructions  Schedule Echo   Schedule 30 day Event Monitor    Your physician recommends that you schedule a follow-up appointment as needed.     Signed, Donato Schultz, MD  06/06/2016 8:49 AM    [facility] [address] [place], [place], [place]  [address] Phone: [phone]; Fax: [phone]

## 2016-06-10 ENCOUNTER — Ambulatory Visit (INDEPENDENT_AMBULATORY_CARE_PROVIDER_SITE_OTHER): Payer: Medicare Other

## 2016-06-10 ENCOUNTER — Encounter (INDEPENDENT_AMBULATORY_CARE_PROVIDER_SITE_OTHER): Payer: Self-pay

## 2016-06-10 DIAGNOSIS — R002 Palpitations: Secondary | ICD-10-CM | POA: Diagnosis not present

## 2016-06-10 DIAGNOSIS — I639 Cerebral infarction, unspecified: Secondary | ICD-10-CM

## 2016-06-10 DIAGNOSIS — H5442 Blindness, left eye, normal vision right eye: Secondary | ICD-10-CM

## 2016-06-10 DIAGNOSIS — E785 Hyperlipidemia, unspecified: Secondary | ICD-10-CM | POA: Diagnosis not present

## 2016-06-10 DIAGNOSIS — H5440 Blindness, one eye, unspecified eye: Secondary | ICD-10-CM

## 2016-06-20 ENCOUNTER — Ambulatory Visit
Admission: RE | Admit: 2016-06-20 | Discharge: 2016-06-20 | Disposition: A | Discharge status: Home / Self Care | Payer: Medicare Other | Source: Ambulatory Visit | Attending: Nurse Practitioner | Admitting: Nurse Practitioner

## 2016-06-20 ENCOUNTER — Other Ambulatory Visit: Payer: Self-pay | Admitting: Nurse Practitioner

## 2016-06-20 ENCOUNTER — Other Ambulatory Visit: Payer: Self-pay

## 2016-06-20 ENCOUNTER — Other Ambulatory Visit: Payer: Medicare Other

## 2016-06-20 ENCOUNTER — Ambulatory Visit (HOSPITAL_COMMUNITY): Payer: Medicare Other | Attending: Cardiology

## 2016-06-20 ENCOUNTER — Encounter (INDEPENDENT_AMBULATORY_CARE_PROVIDER_SITE_OTHER): Payer: Self-pay

## 2016-06-20 DIAGNOSIS — E785 Hyperlipidemia, unspecified: Secondary | ICD-10-CM | POA: Diagnosis not present

## 2016-06-20 DIAGNOSIS — H53132 Sudden visual loss, left eye: Secondary | ICD-10-CM

## 2016-06-20 DIAGNOSIS — H5442 Blindness, left eye, normal vision right eye: Secondary | ICD-10-CM

## 2016-06-20 DIAGNOSIS — I639 Cerebral infarction, unspecified: Secondary | ICD-10-CM

## 2016-06-20 DIAGNOSIS — I6503 Occlusion and stenosis of bilateral vertebral arteries: Secondary | ICD-10-CM | POA: Diagnosis not present

## 2016-06-20 DIAGNOSIS — I358 Other nonrheumatic aortic valve disorders: Secondary | ICD-10-CM | POA: Insufficient documentation

## 2016-06-20 DIAGNOSIS — I5030 Unspecified diastolic (congestive) heart failure: Secondary | ICD-10-CM | POA: Diagnosis not present

## 2016-06-20 DIAGNOSIS — H5440 Blindness, one eye, unspecified eye: Secondary | ICD-10-CM

## 2016-06-20 DIAGNOSIS — I10 Essential (primary) hypertension: Secondary | ICD-10-CM

## 2016-06-20 DIAGNOSIS — I6523 Occlusion and stenosis of bilateral carotid arteries: Secondary | ICD-10-CM | POA: Diagnosis not present

## 2016-06-20 DIAGNOSIS — R002 Palpitations: Secondary | ICD-10-CM

## 2016-06-20 MED ORDER — IOPAMIDOL (ISOVUE-370) INJECTION 76%
80.0000 mL | Freq: Once | INTRAVENOUS | Status: AC | PRN
  Administered 2016-06-20: 80 mL via INTRAVENOUS

## 2016-06-20 MED ORDER — IOPAMIDOL (ISOVUE-370) INJECTION 76%: 80.0000 mL | Freq: Once | INTRAVENOUS | Status: DC | PRN

## 2016-06-25 ENCOUNTER — Ambulatory Visit: Payer: Medicare Other | Admitting: Cardiovascular Disease

## 2016-06-26 ENCOUNTER — Telehealth: Payer: Self-pay | Admitting: Cardiovascular Disease

## 2016-06-26 NOTE — Telephone Encounter (Signed)
This is a Dr. Anne FuSkains patient.

## 2016-06-26 NOTE — Telephone Encounter (Signed)
Reviewed results of echo with wife who states understanding.  There is a DPR on file to be able to speak with her re: results.

## 2016-06-26 NOTE — Telephone Encounter (Signed)
New message  Pt wife call requesting to speak with RN about pt lab results. Please call back to discuss

## 2016-06-30 DIAGNOSIS — J069 Acute upper respiratory infection, unspecified: Secondary | ICD-10-CM | POA: Diagnosis not present

## 2016-07-01 ENCOUNTER — Encounter: Payer: Self-pay | Admitting: Neurology

## 2016-07-01 ENCOUNTER — Ambulatory Visit (INDEPENDENT_AMBULATORY_CARE_PROVIDER_SITE_OTHER): Payer: Medicare Other | Admitting: Neurology

## 2016-07-01 ENCOUNTER — Ambulatory Visit: Payer: Medicare Other | Admitting: Neurology

## 2016-07-01 VITALS — BP 135/69 | HR 73 | Ht 70.0 in | Wt 203.0 lb

## 2016-07-01 DIAGNOSIS — R413 Other amnesia: Secondary | ICD-10-CM

## 2016-07-01 NOTE — Progress Notes (Signed)
[facility]  PATIENT: Daniel Morrow DOB: 10-19-1940   REASON FOR VISIT: Follow-up for visual loss in left eye, dementia HISTORY FROM:Patient and wife    HISTORY OF PRESENT ILLNESS:UPDATE 06/03/16 CM. Daniel Morrow, 75 year old male returns for acute visit. He was seen in the office for dementia in the past by Dr. Pearlean Brownie.. Wife called in after appointment with ophthalmologist yesterday. Dr. Precious Bard had attempted to call Dr. Pearlean Brownie yesterday with visual changes on patient's exam. He complains of visual loss left eye, uncertain how long this has been going on according to the patient and wife. He has not noted any changes. He denies any double vision or headache. He has a history of stroke in 2013 is currently on Aggrenox. He had an ER visit on 05/05/2016 for palpitations. He has not seen cardiology yet but has an appointment. He also has a history of dementia and is on Namenda twice daily. Denies any recent falls. He denies any hallucinations or delusions. Denies any weakness of one side of the body or the other. Denies any sensory changes. Denies any changes in speech or increased confusion. He returns for reevaluation   Update 03/27/2016 : He returns for follow-up after last visit 2 and half months ago. He is accompanied by his wife who states that he continues to have cognitive difficulties with short-term memory, remembering recent events and names. He was unable to tolerate Aricept which was discontinued and has been able to tolerated Namenda 10 mg twice daily. Wife feels that he is depressed a lot and is not socially outgoing. He gets disoriented from time to time. He is had some anger issues and agitation as well. Patient has also been having disturbed sleep and wife suspect that his sleep talking. He had 2 instances of urination on the bathroom mat. Patient has mild tremors in his left hand. He was started on depression medication by his primary physician but it did not help and was  discontinued. He does have mild drooling and has overall slowdown in his activities. His balance and gait are good and he is not had any falls. There have been no hallucinations or delusions. [name] decided not to discontinued Aggrenox and he is still on it. Update 01/15/2016 : Patient returns for follow-up today after last visit more than 2 and half years ago. Is accompanied by his daughter. Patient was seen in image and 0 month 12/12/68 and 1 episode of confusion and his orientation. He woke up that day and could not remember events for the past few days and was quite confused. He also complained of posterior neck pain. He had lifted some weights the day before as there was some damage to his roof from his stump. The patient's daughter is accompanying him today states that he is had some mild intermittent confusion, short-term memory issues as well as more recently word finding difficulties in completing sentences. He is living at home with his wife. He had positive cerebellar infarct 3 years ago and since then did have mild short-term memory difficulties. He was in fact started on Aricept 5 mg which he seems to tolerating well but family has noticed some fine action tremor of his hands. Initially the thought this was related to his depression medication which has been discontinued but the tremors have persisted. The tremors in both hands more in the left they're absent at rest. It did not interfere with his day-to-day activities. He has not had any trouble with gait, balance,  drooling of saliva or bradykinesia. Patient recently had an upper respiratory tract infection and cough and when seen in the emergency room on 3/23//2017 and his oxygen levels were found to be low. He improved after giving oxygen as well as inhalers. MRI scan of the brain was obtained which I personally reviewed shows mild generalized atrophy and old cerebellar infarct no acute findings were noted. Urine analysis was normal and  CBC and comprehensive metabolic panel labs were unremarkable. Patient remains on Aggrenox for stroke prevention but states that it is too expensive and would like to switch to a cheaper alternative. His blood pressure is well controlled and today it is 131/71. He is tolerating Lipitor well without muscle aches and pains. He states his blood sugars have all been under good control. Patient denies any recent falls or head injury. No history of seizures. There is no family history of dementia. He has not had any unsafe behavior, agitation, hallucinations, delusions. Update 07/01/2016 : He returns for follow-up after last visit 06/03/16 with Latrelle Dodrill, nurse practitioner. Is accompanied by his wife. His left eye vision loss continues. He is being seen by Dr. Precious Bard ophthalmologist in  [place]. His been started on some eyedrops. Head does have a follow-up appointment to see him soon. Patient and wife were bothered by his tremors more now. The tremors are intermittent present more in the left hand than the right. He has noticed trouble with his writing to visit with [place] signed. He is able to hold a cup of tea and coffee and drinks without spilling. Patient continues to have mild short-term memory difficulties but these appear to be unchanged and are not progressive. He still needs some these help with a few activities by his wife but can manage most things by himself. He recently seen cardiologist Dr. Chales Abrahams who has asked him to have an external monitor. He did have an echocardiogram which was fine. Patient did undergo CT angiogram of the brain and neck on 9/29 for 17 which was ordered at the last visit and it does not show any significant extracranial or intracranial stenosis to explain his vision loss. REVIEW OF SYSTEMS: Full 14 system review of systems performed and notable only for those listed, all others are neg: Loss of vision, runny nose, diarrhea, incontinence of bladder, frequent urination, tremors,  speech difficulty, memory loss, confusion, depression and all other systems negative    ALLERGIES: No Known Allergies  HOME MEDICATIONS: Outpatient Medications Prior to Visit  Medication Sig Dispense Refill  . albuterol (PROVENTIL) (2.5 MG/3ML) 0.083% nebulizer solution Take 3 mLs (2.5 mg total) by nebulization every 4 (four) hours as needed for wheezing or shortness of breath. 30 vial 0  . atorvastatin (LIPITOR) 40 MG tablet Take 40 mg by mouth daily.    . benazepril (LOTENSIN) 10 MG tablet Take 10 mg by mouth daily.    Marland Kitchen dipyridamole-aspirin (AGGRENOX) 200-25 MG 12hr capsule Take 1 capsule by mouth 2 (two) times daily. 60 capsule 3  . latanoprost (XALATAN) 0.005 % ophthalmic solution Place 1 drop into both eyes at bedtime.   5  . memantine (NAMENDA) 10 MG tablet TAKE 1 TABLET BY MOUTH 2 TIMES DAILY. 180 tablet 3  . metFORMIN (GLUCOPHAGE) 1000 MG tablet Take 1,000 mg by mouth daily with breakfast.   2  . Multiple Vitamin (MULTIVITAMIN WITH MINERALS) TABS tablet Take 1 tablet by mouth daily.    . pantoprazole (PROTONIX) 40 MG tablet Take 40 mg by mouth daily.    Marland Kitchen  sertraline (ZOLOFT) 50 MG tablet Take 1 tablet (50 mg total) by mouth daily. 30 tablet 3  . divalproex (DEPAKOTE ER) 500 MG 24 hr tablet Take 1 tablet (500 mg total) by mouth daily. (Patient not taking: Reported on 07/01/2016) 30 tablet 3  . glimepiride (AMARYL) 2 MG tablet Take 2 mg by mouth daily.  5  . JARDIANCE 10 MG TABS tablet Take 10 mg by mouth daily.   0   No facility-administered medications prior to visit.     PAST MEDICAL HISTORY: Past Medical History:  Diagnosis Date  . Diabetes mellitus   . Hyperlipidemia   . Hypertension   . Stroke [facility])     PAST SURGICAL HISTORY: Past Surgical History:  Procedure Laterality Date  . KNEE ARTHROSCOPY    . NECK SURGERY      FAMILY HISTORY: History reviewed. No pertinent family history.  SOCIAL HISTORY: Social History   Social History  . Marital status: Married      Spouse name: [name]  . Number of children: 3  . Years of education: College   Occupational History  . Retired    Social History Main Topics  . Smoking status: Never Smoker  . Smokeless tobacco: Never Used  . Alcohol use No  . Drug use: No  . Sexual activity: Not on file   Other Topics Concern  . Not on file   Social History Narrative   Patient lives at home with family.   Caffeine Use: 2 cups daily     PHYSICAL EXAM  Vitals:   07/01/16 0945  BP: 135/69  Pulse: 73  Weight: 203 lb (92.1 kg)  Height: 5\' 10"  (1.778 m)   Body mass index is 29.13 kg/m. General: well developed, well nourished Elderly Caucasian male, seated, in no evident distress Head: head normocephalic and atraumatic.   Neck: supple with no carotid  bruits Cardiovascular: regular rate and rhythm, no murmurs Musculoskeletal: no deformity Skin: no rash/petichiae Vascular: Normal pulses all extremities    Neurological examination   Mentation: Alert oriented to time, place, history taking. MMSE 28/30. Diminished recall 2/3. AFT 6. Clock drawing 4/4. Attention span and concentration appropriate. .  Follows all commands speech and language fluent.   Cranial nerve II-XII: Fundoscopic exam not done. Diminished vision acuity on left.. Left pupil slow  to react, right pupil  equal round reactive to light.  extraocular movements were full, visual field were full on confrontational test on the right only. Facial sensation and strength were normal. hearing was intact to finger rubbing bilaterally. Uvula tongue midline. head turning and shoulder shrug were normal and symmetric.Tongue protrusion into cheek strength was normal. Motor: normal bulk and tone, full strength in the BUE, BLE, fine finger movements normal, no pronator drift. No focal weakness. Very mild intermittent tremor of the left hand when he begins to walk. Mild cogwheel rigidity in both wrists upon activation only. No bradykinesia. Able to rise from a  chair with arms folded across her chest. Positive glabellar tap. Diminished facial expression. No retropulsion with good posture and balance. Sensory: normal and symmetric to light touch, pinprick, and  Vibration in  the upper and lower extremities Coordination: finger-nose-finger, heel-to-shin bilaterally, no dysmetria Reflexes 1+ upper lower and symmetric  plantar responses were flexor bilaterally. Gait and Station: Rising up from seated position without assistance, normal stance,  moderate stride, good arm swing, smooth turning, able to perform tiptoe, and heel walking without difficulty. Tandem gait is steady  DIAGNOSTIC DATA (LABS, IMAGING,  TESTING) - I reviewed patient records, labs, notes, testing and imaging myself where available.  Lab Results  Component Value Date   WBC 5.7 05/05/2016   HGB 11.9 (L) 05/05/2016   HCT 38.6 (L) 05/05/2016   MCV 88.3 05/05/2016   PLT 248 05/05/2016      Component Value Date/Time   NA 144 06/03/2016 1240   K 5.1 06/03/2016 1240   CL 102 06/03/2016 1240   CO2 26 06/03/2016 1240   GLUCOSE 135 (H) 06/03/2016 1240   GLUCOSE 240 (H) 05/05/2016 0003   BUN 18 06/03/2016 1240   CREATININE 1.10 06/03/2016 1240   CALCIUM 9.7 06/03/2016 1240   PROT 7.2 12/13/2015 1002   ALBUMIN 4.1 12/13/2015 1002   AST 33 12/13/2015 1002   ALT 30 12/13/2015 1002   ALKPHOS 75 12/13/2015 1002   BILITOT 1.2 12/13/2015 1002   GFRNONAA 66 06/03/2016 1240   GFRAA 77 06/03/2016 1240     Lab Results  Component Value Date   VITAMINB12 448 01/16/2016   Lab Results  Component Value Date   TSH 3.130 01/16/2016    ASSESSMENT AND PLAN  75 y.o. year old male  has a past medical history of Diabetes mellitus; Hyperlipidemia; Hypertension; and Stroke [facility](HCC). here to follow-up. 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 But negative neurovascular workup. Mild memory and cognitive difficulties secondary to mild cognitive impairment versus early  dementia. PLAN:     I had a long discussion with the patient and his wife regarding his mild dementia which appears to be stable. Continue Namenda 10 mg twice daily. I recommend increase Zoloft dose 200 mg daily to help with his anxiety and depression. Continue Aggrenox for stroke prevention with strict control of hypertension with blood pressure goal below 130/90, lipids with LDL cholesterol goal below 70 mg percent and diabetes with hemoglobin A1c goal below 6.5. Patient may also consider participation in CREAD  dementia trial if interested. They were given information to review at home. I also advised the patient to see his urologist for his urge incontinence. Patient does have mild resting tremor which does not appear to be functionally disabling and hence we'll hold off on medications for that at the current time. Return for follow-up in 6 months or call earlier if necessary Delia HeadyPramod Sethi, MD [facility] [address], [address] [place], [place] [address] [phone]

## 2016-07-01 NOTE — Patient Instructions (Signed)
I had a long discussion with the patient and his wife regarding his mild dementia which appears to be stable. Continue Namenda 10 mg twice daily. I recommend increase Zoloft dose 200 mg daily to help with his anxiety and depression. Continue Aggrenox for stroke prevention with strict control of hypertension with blood pressure goal below 130/90, lipids with LDL cholesterol goal below 70 mg percent and diabetes with hemoglobin A1c goal below 6.5. Patient may also consider participation in CREAD  dementia trial if interested. They were given information to review at home. I also advised the patient to see his urologist for his urge incontinence. Patient does have mild resting tremor which does not appear to be functionally disabling and hence we'll hold off on medications for that at the current time. Return for follow-up in 6 months or call earlier if necessary

## 2016-07-09 DIAGNOSIS — R3914 Feeling of incomplete bladder emptying: Secondary | ICD-10-CM | POA: Diagnosis not present

## 2016-07-09 DIAGNOSIS — R3915 Urgency of urination: Secondary | ICD-10-CM | POA: Diagnosis not present

## 2016-07-09 DIAGNOSIS — R81 Glycosuria: Secondary | ICD-10-CM | POA: Diagnosis not present

## 2016-07-18 ENCOUNTER — Telehealth: Payer: Self-pay | Admitting: Neurology

## 2016-07-18 ENCOUNTER — Other Ambulatory Visit: Payer: Self-pay | Admitting: Neurology

## 2016-07-18 MED ORDER — SERTRALINE HCL 50 MG PO TABS: 100.0000 mg | ORAL_TABLET | Freq: Every day | ORAL | 3 refills | Status: DC

## 2016-07-18 NOTE — Telephone Encounter (Signed)
NEw rx for dosage increase for zoloft to 100mg . Pt is to take 2 tablets daily. MEdication sent to [facility].

## 2016-07-18 NOTE — Telephone Encounter (Signed)
Wife called to check status of sertraline (ZOLOFT) 50 MG tablet to [facility], states Dr. Pearlean BrownieSethi was changing from 50 MG to 100 MG and pharmacy hasn't received the prescription.

## 2016-07-18 NOTE — Telephone Encounter (Signed)
Clarifications I meant to increase hisr Zoloft from 50 mg daily  To 100 mg daily

## 2016-07-23 DIAGNOSIS — E1142 Type 2 diabetes mellitus with diabetic polyneuropathy: Secondary | ICD-10-CM | POA: Diagnosis not present

## 2016-07-30 ENCOUNTER — Telehealth: Payer: Self-pay | Admitting: Cardiology

## 2016-07-30 NOTE — Telephone Encounter (Signed)
Pts wife is aware of results.

## 2016-07-30 NOTE — Telephone Encounter (Signed)
     Normal sinus rhythms   No atrial fibrillation   Reassuring    Donato SchultzMark Skains, MD

## 2016-07-30 NOTE — Progress Notes (Unsigned)
Pt's aware of results

## 2016-07-30 NOTE — Telephone Encounter (Signed)
Pt wife is returning a call for test results

## 2016-07-31 DIAGNOSIS — R3915 Urgency of urination: Secondary | ICD-10-CM | POA: Diagnosis not present

## 2016-07-31 DIAGNOSIS — B3742 Candidal balanitis: Secondary | ICD-10-CM | POA: Diagnosis not present

## 2016-07-31 DIAGNOSIS — N401 Enlarged prostate with lower urinary tract symptoms: Secondary | ICD-10-CM | POA: Diagnosis not present

## 2016-08-05 DIAGNOSIS — H47292 Other optic atrophy, left eye: Secondary | ICD-10-CM | POA: Diagnosis not present

## 2016-08-05 DIAGNOSIS — H2513 Age-related nuclear cataract, bilateral: Secondary | ICD-10-CM | POA: Diagnosis not present

## 2016-08-05 DIAGNOSIS — H401130 Primary open-angle glaucoma, bilateral, stage unspecified: Secondary | ICD-10-CM | POA: Diagnosis not present

## 2016-08-20 DIAGNOSIS — J209 Acute bronchitis, unspecified: Secondary | ICD-10-CM | POA: Diagnosis not present

## 2016-08-29 ENCOUNTER — Telehealth: Payer: Self-pay | Admitting: Neurology

## 2016-08-29 NOTE — Telephone Encounter (Signed)
Patients wife called and stated that the patient is having dental surgery on the 27th of December. She wants to know if the patient should discontinue his blood thinner before the surgery. Dr Lilian KapurMcdonald in [place] is where the patient is having dental surgery. His phone number is [phone] (doctors office). Please call and advise.

## 2016-08-29 NOTE — Telephone Encounter (Signed)
RN call Dr. Lilian KapurMcdonald office to get fax number for dental clearance.Fax number was [phone] Attention Liz. Clearance letter fax and confirmed and receive.

## 2016-09-02 DIAGNOSIS — M545 Low back pain: Secondary | ICD-10-CM | POA: Diagnosis not present

## 2016-09-02 DIAGNOSIS — R413 Other amnesia: Secondary | ICD-10-CM | POA: Diagnosis not present

## 2016-09-02 DIAGNOSIS — R259 Unspecified abnormal involuntary movements: Secondary | ICD-10-CM | POA: Diagnosis not present

## 2016-09-02 DIAGNOSIS — H5462 Unqualified visual loss, left eye, normal vision right eye: Secondary | ICD-10-CM | POA: Diagnosis not present

## 2016-09-08 DIAGNOSIS — R05 Cough: Secondary | ICD-10-CM | POA: Diagnosis not present

## 2016-09-09 DIAGNOSIS — H43811 Vitreous degeneration, right eye: Secondary | ICD-10-CM | POA: Diagnosis not present

## 2016-09-09 DIAGNOSIS — E113292 Type 2 diabetes mellitus with mild nonproliferative diabetic retinopathy without macular edema, left eye: Secondary | ICD-10-CM | POA: Diagnosis not present

## 2016-09-09 DIAGNOSIS — H401122 Primary open-angle glaucoma, left eye, moderate stage: Secondary | ICD-10-CM | POA: Diagnosis not present

## 2016-09-10 DIAGNOSIS — H402223 Chronic angle-closure glaucoma, left eye, severe stage: Secondary | ICD-10-CM | POA: Diagnosis not present

## 2016-09-10 DIAGNOSIS — H2513 Age-related nuclear cataract, bilateral: Secondary | ICD-10-CM | POA: Diagnosis not present

## 2016-09-10 DIAGNOSIS — H402211 Chronic angle-closure glaucoma, right eye, mild stage: Secondary | ICD-10-CM | POA: Diagnosis not present

## 2016-09-12 DIAGNOSIS — N182 Chronic kidney disease, stage 2 (mild): Secondary | ICD-10-CM | POA: Diagnosis not present

## 2016-09-12 DIAGNOSIS — R197 Diarrhea, unspecified: Secondary | ICD-10-CM | POA: Diagnosis not present

## 2016-09-12 DIAGNOSIS — E1165 Type 2 diabetes mellitus with hyperglycemia: Secondary | ICD-10-CM | POA: Diagnosis not present

## 2016-09-12 DIAGNOSIS — E1142 Type 2 diabetes mellitus with diabetic polyneuropathy: Secondary | ICD-10-CM | POA: Diagnosis not present

## 2016-09-12 DIAGNOSIS — E1122 Type 2 diabetes mellitus with diabetic chronic kidney disease: Secondary | ICD-10-CM | POA: Diagnosis not present

## 2016-09-24 DIAGNOSIS — H402223 Chronic angle-closure glaucoma, left eye, severe stage: Secondary | ICD-10-CM | POA: Diagnosis not present

## 2016-09-24 DIAGNOSIS — H402211 Chronic angle-closure glaucoma, right eye, mild stage: Secondary | ICD-10-CM | POA: Diagnosis not present

## 2016-09-24 DIAGNOSIS — H2513 Age-related nuclear cataract, bilateral: Secondary | ICD-10-CM | POA: Diagnosis not present

## 2016-10-14 DIAGNOSIS — H402223 Chronic angle-closure glaucoma, left eye, severe stage: Secondary | ICD-10-CM | POA: Diagnosis not present

## 2016-10-14 DIAGNOSIS — H2513 Age-related nuclear cataract, bilateral: Secondary | ICD-10-CM | POA: Diagnosis not present

## 2016-10-14 DIAGNOSIS — H402211 Chronic angle-closure glaucoma, right eye, mild stage: Secondary | ICD-10-CM | POA: Diagnosis not present

## 2016-10-17 DIAGNOSIS — E1142 Type 2 diabetes mellitus with diabetic polyneuropathy: Secondary | ICD-10-CM | POA: Diagnosis not present

## 2016-11-25 DIAGNOSIS — H402211 Chronic angle-closure glaucoma, right eye, mild stage: Secondary | ICD-10-CM | POA: Diagnosis not present

## 2016-11-25 DIAGNOSIS — H2513 Age-related nuclear cataract, bilateral: Secondary | ICD-10-CM | POA: Diagnosis not present

## 2016-11-25 DIAGNOSIS — H402223 Chronic angle-closure glaucoma, left eye, severe stage: Secondary | ICD-10-CM | POA: Diagnosis not present

## 2016-11-27 DIAGNOSIS — R413 Other amnesia: Secondary | ICD-10-CM | POA: Diagnosis not present

## 2016-11-27 DIAGNOSIS — I6782 Cerebral ischemia: Secondary | ICD-10-CM | POA: Diagnosis not present

## 2016-11-27 DIAGNOSIS — R251 Tremor, unspecified: Secondary | ICD-10-CM | POA: Diagnosis not present

## 2016-12-12 DIAGNOSIS — E1122 Type 2 diabetes mellitus with diabetic chronic kidney disease: Secondary | ICD-10-CM | POA: Diagnosis not present

## 2016-12-12 DIAGNOSIS — Z7984 Long term (current) use of oral hypoglycemic drugs: Secondary | ICD-10-CM | POA: Diagnosis not present

## 2016-12-12 DIAGNOSIS — I10 Essential (primary) hypertension: Secondary | ICD-10-CM | POA: Diagnosis not present

## 2016-12-12 DIAGNOSIS — K219 Gastro-esophageal reflux disease without esophagitis: Secondary | ICD-10-CM | POA: Diagnosis not present

## 2016-12-17 DIAGNOSIS — E1122 Type 2 diabetes mellitus with diabetic chronic kidney disease: Secondary | ICD-10-CM | POA: Diagnosis not present

## 2016-12-17 DIAGNOSIS — N182 Chronic kidney disease, stage 2 (mild): Secondary | ICD-10-CM | POA: Diagnosis not present

## 2016-12-17 DIAGNOSIS — E1142 Type 2 diabetes mellitus with diabetic polyneuropathy: Secondary | ICD-10-CM | POA: Diagnosis not present

## 2016-12-17 DIAGNOSIS — E1165 Type 2 diabetes mellitus with hyperglycemia: Secondary | ICD-10-CM | POA: Diagnosis not present

## 2016-12-30 ENCOUNTER — Encounter: Payer: Self-pay | Admitting: Neurology

## 2016-12-30 ENCOUNTER — Ambulatory Visit (INDEPENDENT_AMBULATORY_CARE_PROVIDER_SITE_OTHER): Payer: Medicare Other | Admitting: Neurology

## 2016-12-30 ENCOUNTER — Encounter (INDEPENDENT_AMBULATORY_CARE_PROVIDER_SITE_OTHER): Payer: Self-pay

## 2016-12-30 VITALS — BP 110/60 | HR 64 | Wt 199.2 lb

## 2016-12-30 DIAGNOSIS — F0280 Dementia in other diseases classified elsewhere without behavioral disturbance: Secondary | ICD-10-CM | POA: Diagnosis not present

## 2016-12-30 DIAGNOSIS — G301 Alzheimer's disease with late onset: Secondary | ICD-10-CM | POA: Diagnosis not present

## 2016-12-30 NOTE — Patient Instructions (Signed)
I  had a long discussion with the patient and his wife regarding his mild dementia which appears to be stable. Continue Namenda 10 mg twice daily for dementia which is more or less stable.   Continue Aggrenox for stroke prevention with strict control of hypertension with blood pressure goal below 130/90, lipids with LDL cholesterol goal below 70 mg percent and diabetes with hemoglobin A1c goal below 6.5. Patient may also consider participation in [employer] dementia trial if interested. They were given information to review at home. . Patient does have mild resting tremor which does not appear to be functionally disabling and he lacks other features of Parkinson's and hence we'll hold off on medications for that at the current time. Return for follow-up in 6 months or call earlier if necessary

## 2016-12-30 NOTE — Progress Notes (Signed)
[facility]  PATIENT: Daniel Morrow DOB: 10-19-1940   REASON FOR VISIT: Follow-up for visual loss in left eye, dementia HISTORY FROM:Patient and wife    HISTORY OF PRESENT ILLNESS:UPDATE 06/03/16 CM. [name], 76 year old male returns for acute visit. He was seen in the office for dementia in the past by Dr. Pearlean Brownie.. Wife called in after appointment with ophthalmologist yesterday. Dr. Precious Bard had attempted to call Dr. Pearlean Brownie yesterday with visual changes on patient's exam. He complains of visual loss left eye, uncertain how long this has been going on according to the patient and wife. He has not noted any changes. He denies any double vision or headache. He has a history of stroke in 2013 is currently on Aggrenox. He had an ER visit on 05/05/2016 for palpitations. He has not seen cardiology yet but has an appointment. He also has a history of dementia and is on Namenda twice daily. Denies any recent falls. He denies any hallucinations or delusions. Denies any weakness of one side of the body or the other. Denies any sensory changes. Denies any changes in speech or increased confusion. He returns for reevaluation   Update 03/27/2016 : He returns for follow-up after last visit 2 and half months ago. He is accompanied by his wife who states that he continues to have cognitive difficulties with short-term memory, remembering recent events and names. He was unable to tolerate Aricept which was discontinued and has been able to tolerated Namenda 10 mg twice daily. Wife feels that he is depressed a lot and is not socially outgoing. He gets disoriented from time to time. He is had some anger issues and agitation as well. Patient has also been having disturbed sleep and wife suspect that his sleep talking. He had 2 instances of urination on the bathroom mat. Patient has mild tremors in his left hand. He was started on depression medication by his primary physician but it did not help and was  discontinued. He does have mild drooling and has overall slowdown in his activities. His balance and gait are good and he is not had any falls. There have been no hallucinations or delusions. [name] decided not to discontinued Aggrenox and he is still on it. Update 01/15/2016 : Patient returns for follow-up today after last visit more than 2 and half years ago. Is accompanied by his daughter. Patient was seen in image and 0 month 12/12/68 and 1 episode of confusion and his orientation. He woke up that day and could not remember events for the past few days and was quite confused. He also complained of posterior neck pain. He had lifted some weights the day before as there was some damage to his roof from his stump. The patient's daughter is accompanying him today states that he is had some mild intermittent confusion, short-term memory issues as well as more recently word finding difficulties in completing sentences. He is living at home with his wife. He had positive cerebellar infarct 3 years ago and since then did have mild short-term memory difficulties. He was in fact started on Aricept 5 mg which he seems to tolerating well but family has noticed some fine action tremor of his hands. Initially the thought this was related to his depression medication which has been discontinued but the tremors have persisted. The tremors in both hands more in the left they're absent at rest. It did not interfere with his day-to-day activities. He has not had any trouble with gait, balance,  drooling of saliva or bradykinesia. Patient recently had an upper respiratory tract infection and cough and when seen in the emergency room on 3/23//2017 and his oxygen levels were found to be low. He improved after giving oxygen as well as inhalers. MRI scan of the brain was obtained which I personally reviewed shows mild generalized atrophy and old cerebellar infarct no acute findings were noted. Urine analysis was normal and  CBC and comprehensive metabolic panel labs were unremarkable. Patient remains on Aggrenox for stroke prevention but states that it is too expensive and would like to switch to a cheaper alternative. His blood pressure is well controlled and today it is 131/71. He is tolerating Lipitor well without muscle aches and pains. He states his blood sugars have all been under good control. Patient denies any recent falls or head injury. No history of seizures. There is no family history of dementia. He has not had any unsafe behavior, agitation, hallucinations, delusions. Update 07/01/2016 : He returns for follow-up after last visit 06/03/16 with Latrelle Dodrill, nurse practitioner. Is accompanied by his wife. His left eye vision loss continues. He is being seen by Dr. Precious Bard ophthalmologist in  [place]. His been started on some eyedrops. Head does have a follow-up appointment to see him soon. Patient and wife were bothered by his tremors more now. The tremors are intermittent present more in the left hand than the right. He has noticed trouble with his writing to visit with [place] signed. He is able to hold a cup of tea and coffee and drinks without spilling. Patient continues to have mild short-term memory difficulties but these appear to be unchanged and are not progressive. He still needs some these help with a few activities by his wife but can manage most things by himself. He recently seen cardiologist Dr. Chales Abrahams who has asked him to have an external monitor. He did have an echocardiogram which was fine. Patient did undergo CT angiogram of the brain and neck on 9/29 /17 which was ordered at the last visit and it does not show any significant extracranial or intracranial stenosis to explain his vision loss. Update 12/30/2016 ; he returns for follow-up today accompanied by his wife falling last visit 6 months ago. He continues to do well without recurrent TIA or stroke symptoms. Continues to have memory difficulties.  This has been ongoing for the last 2 years but seems relatively stable recently. He is tolerating Namenda 10 mg twice daily daily without any side effects. His this difficulty in following directions occasionally forgets to take his medications and wife has to give him. Otherwise is quite independent with most activities of daily living for himself. Remains on Aggrenox which is tolerating well without side effects. His blood pressure is well controlled and today it is 110/60. His sugars also well controlled. The patient continues to have mild tremors in his hand and wife feels they have improved after the dose of Zoloft was reduced to 25 mg. He denies significant bradykinesia , difficultygetting out of bed problems with gait imbalance of fall falling easily. He has persistent left eye vision loss REVIEW OF SYSTEMS: Full 14 system review of systems performed and notable only for those listed, all others are neg: Loss of vision, runny nose, frequency of urination, daytime sleepiness  and all other systems negative    ALLERGIES: No Known Allergies  HOME MEDICATIONS: Outpatient Medications Prior to Visit  Medication Sig Dispense Refill  . albuterol (PROVENTIL) (2.5 MG/3ML) 0.083% nebulizer solution Take 3  mLs (2.5 mg total) by nebulization every 4 (four) hours as needed for wheezing or shortness of breath. 30 vial 0  . atorvastatin (LIPITOR) 40 MG tablet Take 40 mg by mouth daily.    . benazepril (LOTENSIN) 10 MG tablet Take 10 mg by mouth daily.    Marland Kitchen dipyridamole-aspirin (AGGRENOX) 200-25 MG 12hr capsule Take 1 capsule by mouth 2 (two) times daily. 60 capsule 3  . fluticasone (FLONASE) 50 MCG/ACT nasal spray   0  . glimepiride (AMARYL) 4 MG tablet TK 1 T PO  WITH BRE OR THE FIRST MAIN MEAL OF THE DAY ONCE D .  3  . JARDIANCE 25 MG TABS tablet   2  . latanoprost (XALATAN) 0.005 % ophthalmic solution Place 1 drop into both eyes at bedtime.   5  . memantine (NAMENDA) 10 MG tablet TAKE 1 TABLET BY MOUTH 2  TIMES DAILY. 180 tablet 3  . metFORMIN (GLUCOPHAGE) 1000 MG tablet Take 1,000 mg by mouth daily with breakfast.   2  . Multiple Vitamin (MULTIVITAMIN WITH MINERALS) TABS tablet Take 1 tablet by mouth daily.    . pantoprazole (PROTONIX) 40 MG tablet Take 40 mg by mouth daily.    Marland Kitchen PE-CPM-DM-GG-APAP PO Take by mouth as needed.     . benzonatate (TESSALON) 200 MG capsule Take 200 mg by mouth 3 (three) times daily as needed for cough.    . sertraline (ZOLOFT) 50 MG tablet Take 2 tablets (100 mg total) by mouth daily. (Patient not taking: Reported on 12/30/2016) 60 tablet 3   No facility-administered medications prior to visit.     PAST MEDICAL HISTORY: Past Medical History:  Diagnosis Date  . Diabetes mellitus   . Hyperlipidemia   . Hypertension   . Stroke [facility])     PAST SURGICAL HISTORY: Past Surgical History:  Procedure Laterality Date  . KNEE ARTHROSCOPY    . NECK SURGERY      FAMILY HISTORY: History reviewed. No pertinent family history.  SOCIAL HISTORY: Social History   Social History  . Marital status: Married    Spouse name: [name]  . Number of children: 3  . Years of education: College   Occupational History  . Retired    Social History Main Topics  . Smoking status: Never Smoker  . Smokeless tobacco: Never Used  . Alcohol use No  . Drug use: No  . Sexual activity: Not on file   Other Topics Concern  . Not on file   Social History Narrative   Patient lives at home with family.   Caffeine Use: 2 cups daily     PHYSICAL EXAM  Vitals:   12/30/16 1106  BP: 110/60  Pulse: 64  Weight: 199 lb 3.2 oz (90.4 kg)   Body mass index is 28.58 kg/m. General: well developed, well nourished Elderly Caucasian male, seated, in no evident distress Head: head normocephalic and atraumatic.   Neck: supple with no carotid  bruits Cardiovascular: regular rate and rhythm, no murmurs Musculoskeletal: no deformity Skin: no rash/petichiae Vascular: Normal pulses all  extremities    Neurological examination   Mentation: Alert oriented to time, place, history taking. MMSE 24/30. Diminished recall 2/3. AFT 9. Clock drawing 4/4. Geriatric depression scale 3 not depressed Attention span and concentration appropriate. .  Follows all commands speech and language fluent.  Cranial nerve II-XII: Fundoscopic exam not done. Diminished vision acuity on left. And can count fingers at 2 fet only. Left pupil slow  to react, right pupil  equal round reactive to light.  extraocular movements were full, visual field were full on confrontational test on the right only. Facial sensation and strength were normal. hearing was intact to finger rubbing bilaterally. Uvula tongue midline. head turning and shoulder shrug were normal and symmetric.Tongue protrusion into cheek strength was normal. Motor: normal bulk and tone, full strength in the BUE, BLE, fine finger movements normal, no pronator drift. No focal weakness. Very mild intermittent tremor of the left hand when he begins to walk. Mild cogwheel rigidity in both wrists upon activation only. No bradykinesia. Able to rise from a chair with arms folded across her chest. Positive glabellar tap. Diminished facial expression. No retropulsion with good posture and balance. Sensory: normal and symmetric to light touch, pinprick, and  Vibration in  the upper and lower extremities Coordination: finger-nose-finger, heel-to-shin bilaterally, no dysmetria Reflexes 1+ upper lower and symmetric  plantar responses were flexor bilaterally. Gait and Station: Rising up from seated position without assistance, normal stance,  moderate stride, good arm swing, smooth turning, able to perform tiptoe, and heel walking without difficulty. Tandem gait is steady  DIAGNOSTIC DATA (LABS, IMAGING, TESTING) - I reviewed patient records, labs, notes, testing and imaging myself where available.  Lab Results  Component Value Date   WBC 5.7 05/05/2016   HGB 11.9  (L) 05/05/2016   HCT 38.6 (L) 05/05/2016   MCV 88.3 05/05/2016   PLT 248 05/05/2016      Component Value Date/Time   NA 144 06/03/2016 1240   K 5.1 06/03/2016 1240   CL 102 06/03/2016 1240   CO2 26 06/03/2016 1240   GLUCOSE 135 (H) 06/03/2016 1240   GLUCOSE 240 (H) 05/05/2016 0003   BUN 18 06/03/2016 1240   CREATININE 1.10 06/03/2016 1240   CALCIUM 9.7 06/03/2016 1240   PROT 7.2 12/13/2015 1002   ALBUMIN 4.1 12/13/2015 1002   AST 33 12/13/2015 1002   ALT 30 12/13/2015 1002   ALKPHOS 75 12/13/2015 1002   BILITOT 1.2 12/13/2015 1002   GFRNONAA 66 06/03/2016 1240   GFRAA 77 06/03/2016 1240     Lab Results  Component Value Date   VITAMINB12 448 01/16/2016   Lab Results  Component Value Date   TSH 3.130 01/16/2016    ASSESSMENT AND PLAN  76 y.o. year old male  has a past medical history of Diabetes mellitus; Hyperlipidemia; Hypertension; and Stroke [facility]). here to follow-up. 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 But negative neurovascular workup. Mild memory and cognitive difficulties secondary to mild Alzheimer`s dementia.Mild left upper extremity resting and action tremor but not functionally disabling PLAN:    I  had a long discussion with the patient and his wife regarding his mild dementia which appears to be stable. Continue Namenda 10 mg twice daily for dementia which is more or less stable.   Continue Aggrenox for stroke prevention with strict control of hypertension with blood pressure goal below 130/90, lipids with LDL cholesterol goal below 70 mg percent and diabetes with hemoglobin A1c goal below 6.5. Patient may also consider participation in [employer] dementia trial if interested. They were given information to review at home. . Patient does have mild resting tremor which does not appear to be functionally disabling and he lacks other features of Parkinson's and hence we'll hold off on medications for that at the current time.  Return for follow-up in 6 months or call earlier if necessary Delia Heady, MD [facility] [phone]  [address], [place], [place] [address] [phone]

## 2017-01-27 DIAGNOSIS — H402211 Chronic angle-closure glaucoma, right eye, mild stage: Secondary | ICD-10-CM | POA: Diagnosis not present

## 2017-01-27 DIAGNOSIS — H2513 Age-related nuclear cataract, bilateral: Secondary | ICD-10-CM | POA: Diagnosis not present

## 2017-01-27 DIAGNOSIS — H402223 Chronic angle-closure glaucoma, left eye, severe stage: Secondary | ICD-10-CM | POA: Diagnosis not present

## 2017-01-28 ENCOUNTER — Telehealth: Payer: Self-pay

## 2017-01-28 NOTE — Telephone Encounter (Signed)
RN fax clearance form to stop aspirin 3 days prior after procedure if patient is willing to take small risk of tia/stroke.Resume aspirin when safe after procedure.This is per Dr. Pearlean BrownieSethi recommendations, on the form he wrote and sign.Form fax and confirmed.

## 2017-01-28 NOTE — Telephone Encounter (Signed)
Rn call [facility] about the surgery clearance form. They stated it will be under local anesthesia.Rn stated Dr. Pearlean BrownieSethi will review pts chart and fax back.

## 2017-02-19 DIAGNOSIS — H2512 Age-related nuclear cataract, left eye: Secondary | ICD-10-CM | POA: Diagnosis not present

## 2017-02-19 DIAGNOSIS — H409 Unspecified glaucoma: Secondary | ICD-10-CM | POA: Diagnosis not present

## 2017-02-19 DIAGNOSIS — H402223 Chronic angle-closure glaucoma, left eye, severe stage: Secondary | ICD-10-CM | POA: Diagnosis not present

## 2017-03-02 ENCOUNTER — Other Ambulatory Visit: Payer: Self-pay

## 2017-03-02 MED ORDER — SERTRALINE HCL 100 MG PO TABS: 100.0000 mg | ORAL_TABLET | Freq: Every day | ORAL | 2 refills | Status: DC

## 2017-03-04 DIAGNOSIS — Z961 Presence of intraocular lens: Secondary | ICD-10-CM | POA: Diagnosis not present

## 2017-03-13 DIAGNOSIS — E1122 Type 2 diabetes mellitus with diabetic chronic kidney disease: Secondary | ICD-10-CM | POA: Diagnosis not present

## 2017-03-27 DIAGNOSIS — E1142 Type 2 diabetes mellitus with diabetic polyneuropathy: Secondary | ICD-10-CM | POA: Diagnosis not present

## 2017-03-27 DIAGNOSIS — N182 Chronic kidney disease, stage 2 (mild): Secondary | ICD-10-CM | POA: Diagnosis not present

## 2017-03-27 DIAGNOSIS — E1165 Type 2 diabetes mellitus with hyperglycemia: Secondary | ICD-10-CM | POA: Diagnosis not present

## 2017-03-27 DIAGNOSIS — E1122 Type 2 diabetes mellitus with diabetic chronic kidney disease: Secondary | ICD-10-CM | POA: Diagnosis not present

## 2017-04-22 DIAGNOSIS — N3941 Urge incontinence: Secondary | ICD-10-CM | POA: Diagnosis not present

## 2017-04-22 DIAGNOSIS — R3915 Urgency of urination: Secondary | ICD-10-CM | POA: Diagnosis not present

## 2017-04-22 DIAGNOSIS — N400 Enlarged prostate without lower urinary tract symptoms: Secondary | ICD-10-CM | POA: Diagnosis not present

## 2017-05-04 DIAGNOSIS — J069 Acute upper respiratory infection, unspecified: Secondary | ICD-10-CM | POA: Diagnosis not present

## 2017-05-04 DIAGNOSIS — R05 Cough: Secondary | ICD-10-CM | POA: Diagnosis not present

## 2017-05-06 DIAGNOSIS — J0100 Acute maxillary sinusitis, unspecified: Secondary | ICD-10-CM | POA: Diagnosis not present

## 2017-05-06 DIAGNOSIS — J209 Acute bronchitis, unspecified: Secondary | ICD-10-CM | POA: Diagnosis not present

## 2017-05-29 DIAGNOSIS — E1122 Type 2 diabetes mellitus with diabetic chronic kidney disease: Secondary | ICD-10-CM | POA: Diagnosis not present

## 2017-06-09 DIAGNOSIS — H402211 Chronic angle-closure glaucoma, right eye, mild stage: Secondary | ICD-10-CM | POA: Diagnosis not present

## 2017-06-09 DIAGNOSIS — Z961 Presence of intraocular lens: Secondary | ICD-10-CM | POA: Diagnosis not present

## 2017-06-27 ENCOUNTER — Emergency Department (HOSPITAL_BASED_OUTPATIENT_CLINIC_OR_DEPARTMENT_OTHER): Payer: Medicare Other

## 2017-06-27 ENCOUNTER — Emergency Department (HOSPITAL_BASED_OUTPATIENT_CLINIC_OR_DEPARTMENT_OTHER)
Admission: EM | Admit: 2017-06-27 | Discharge: 2017-06-27 | Disposition: A | Discharge status: Home / Self Care | Payer: Medicare Other | Attending: Emergency Medicine | Admitting: Emergency Medicine

## 2017-06-27 ENCOUNTER — Encounter (HOSPITAL_BASED_OUTPATIENT_CLINIC_OR_DEPARTMENT_OTHER): Payer: Self-pay | Admitting: *Deleted

## 2017-06-27 DIAGNOSIS — W19XXXA Unspecified fall, initial encounter: Secondary | ICD-10-CM

## 2017-06-27 DIAGNOSIS — Y998 Other external cause status: Secondary | ICD-10-CM | POA: Insufficient documentation

## 2017-06-27 DIAGNOSIS — Z8673 Personal history of transient ischemic attack (TIA), and cerebral infarction without residual deficits: Secondary | ICD-10-CM | POA: Diagnosis not present

## 2017-06-27 DIAGNOSIS — S0181XA Laceration without foreign body of other part of head, initial encounter: Secondary | ICD-10-CM | POA: Insufficient documentation

## 2017-06-27 DIAGNOSIS — F0280 Dementia in other diseases classified elsewhere without behavioral disturbance: Secondary | ICD-10-CM | POA: Diagnosis not present

## 2017-06-27 DIAGNOSIS — G309 Alzheimer's disease, unspecified: Secondary | ICD-10-CM | POA: Diagnosis not present

## 2017-06-27 DIAGNOSIS — Z87891 Personal history of nicotine dependence: Secondary | ICD-10-CM | POA: Insufficient documentation

## 2017-06-27 DIAGNOSIS — Z7984 Long term (current) use of oral hypoglycemic drugs: Secondary | ICD-10-CM | POA: Insufficient documentation

## 2017-06-27 DIAGNOSIS — W01198A Fall on same level from slipping, tripping and stumbling with subsequent striking against other object, initial encounter: Secondary | ICD-10-CM | POA: Diagnosis not present

## 2017-06-27 DIAGNOSIS — Y9389 Activity, other specified: Secondary | ICD-10-CM | POA: Insufficient documentation

## 2017-06-27 DIAGNOSIS — I10 Essential (primary) hypertension: Secondary | ICD-10-CM | POA: Diagnosis not present

## 2017-06-27 DIAGNOSIS — S0101XA Laceration without foreign body of scalp, initial encounter: Secondary | ICD-10-CM

## 2017-06-27 DIAGNOSIS — Y9272 Chicken coop as the place of occurrence of the external cause: Secondary | ICD-10-CM | POA: Insufficient documentation

## 2017-06-27 DIAGNOSIS — Z79899 Other long term (current) drug therapy: Secondary | ICD-10-CM | POA: Diagnosis not present

## 2017-06-27 DIAGNOSIS — E119 Type 2 diabetes mellitus without complications: Secondary | ICD-10-CM | POA: Diagnosis not present

## 2017-06-27 DIAGNOSIS — S199XXA Unspecified injury of neck, initial encounter: Secondary | ICD-10-CM | POA: Diagnosis not present

## 2017-06-27 MED ORDER — BACITRACIN ZINC 500 UNIT/GM EX OINT
TOPICAL_OINTMENT | Freq: Once | CUTANEOUS | Status: AC
  Administered 2017-06-27: 1 via TOPICAL

## 2017-06-27 NOTE — Discharge Instructions (Signed)
Keep the abrasion on your head clean. You may use topical antibiotic ointment if you wish. If there is a risk of getting it dirty, keep the area covered. Follow-up with your primary care doctor as needed. Return to the emergency room if you develop vision changes, numbness, confusion, imbalance, or any new or worsening symptoms.

## 2017-06-27 NOTE — ED Triage Notes (Addendum)
Pt reports around 1530 he fell and hit his head on a chicken coop. States he doesn't remember mechanism of fall, but denies LOC. Pt takes Aggrenox. Pt has abrasion to L forehead -- bleeding controlled.

## 2017-06-27 NOTE — ED Provider Notes (Signed)
[facility]   CSN: [id number] Arrival date & time: 06/27/17  1747     History   Chief Complaint Chief Complaint  Patient presents with  . Fall    HPI Daniel Morrow is a 76 y.o. male presenting with head injury after fall.  Patient states that he was leaning over to feed the chickens when he fell forward. He reports he was off balance, denies dizziness prior to falling. He hit his head on the corner of the chicken coop. Denies loss of consciousness. He is on Aggrenox. He was evaluated at urgent care, instructed to come the emergency room for CT scan to rule out head bleed. He currently denies any pain. He denies headache, neck pain, vision changes, slurred speech, nausea, vomiting, or injury elsewhere. Wife states he is acting per baseline. He has an abrasion on his left forehead, with bleeding that was easily controlled at home.  HPI  Past Medical History:  Diagnosis Date  . Diabetes mellitus   . Hyperlipidemia   . Hypertension   . Stroke [facility])     Patient Active Problem List   Diagnosis Date Noted  . Vision, loss, sudden 06/03/2016  . CRAO (central retinal artery occlusion) 06/03/2016  . Alzheimer's disease 01/16/2016  . Epididymo-orchitis 12/02/2013  . Orchitis and epididymitis 12/02/2013  . Mild cognitive impairment, so stated 02/17/2013  . CVA (cerebral infarction) 02/17/2013  . 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 Hypertension 04/03/2012  . Diabetes mellitus (HCC) 04/03/2012  . Hyperlipidemia     Past Surgical History:  Procedure Laterality Date  . KNEE ARTHROSCOPY    . NECK SURGERY         Home Medications    Prior to Admission medications   Medication Sig Start Date End Date Taking? Authorizing Provider  atorvastatin (LIPITOR) 40 MG tablet Take 40 mg by mouth daily.   Yes [provider]  benazepril (LOTENSIN) 10 MG tablet Take 10 mg by mouth daily.   Yes [provider]  BYDUREON 2 MG PEN  12/20/16  Yes [provider]  dipyridamole-aspirin (AGGRENOX) 200-25 MG 12hr capsule Take 1 capsule by mouth 2 (two) times daily. 05/28/16  Yes Micki Riley, MD  empagliflozin (JARDIANCE) 10 MG TABS tablet Take by mouth.   Yes [provider]  glimepiride (AMARYL) 4 MG tablet TK 1 T PO  WITH BRE OR THE FIRST MAIN MEAL OF THE DAY ONCE D . 06/12/16  Yes [provider]  JARDIANCE 25 MG TABS tablet  06/28/16  Yes [provider]  latanoprost (XALATAN) 0.005 % ophthalmic solution Place 1 drop into both eyes at bedtime.  06/02/16  Yes [provider]  memantine (NAMENDA) 10 MG tablet TAKE 1 TABLET BY MOUTH 2 TIMES DAILY. 05/29/16  Yes Micki Riley, MD  Multiple Vitamin (MULTIVITAMIN WITH MINERALS) TABS tablet Take 1 tablet by mouth daily.   Yes [provider]  pantoprazole (PROTONIX) 40 MG tablet Take 40 mg by mouth daily.   Yes [provider]  sertraline (ZOLOFT) 100 MG tablet Take 1 tablet (100 mg total) by mouth daily. 03/02/17  Yes Micki Riley, MD  tamsulosin (FLOMAX) 0.4 MG CAPS capsule  12/26/16  Yes [provider]  albuterol (PROVENTIL) (2.5 MG/3ML) 0.083% nebulizer solution Take 3 mLs (2.5 mg total) by nebulization every 4 (four) hours as needed for wheezing  or shortness of breath. 12/13/15   Geoffery Lyons, MD  fluticasone Aleda Grana) 50 MCG/ACT nasal spray  06/30/16   [provider]  metFORMIN (GLUCOPHAGE) 1000 MG tablet Take 1,000 mg by mouth daily with breakfast.  02/13/16   [provider]  PE-[id number]-DM-GG-APAP PO Take by mouth as needed.     [provider]    Family History No family history on file.  Social History Social History  Substance Use Topics  . Smoking status: Former Games developer  . Smokeless tobacco: Never Used  . Alcohol use No     Allergies     Patient has no known allergies.   Review of Systems Review of Systems  Musculoskeletal: Negative for back pain and neck pain.  Skin: Positive for wound.  Neurological: Negative for dizziness, syncope, speech difficulty, light-headedness and headaches.  Hematological: Bruises/bleeds easily.  Psychiatric/Behavioral: Negative for confusion.     Physical Exam Updated Vital Signs BP 131/69 (BP Location: Right Arm)   Pulse 63   Temp 97.8 F (36.6 C) (Oral)   Resp 18   Ht  (1.778 m)   Wt 88.9 kg (196 lb)   SpO2 96%   BMI 28.12 kg/m   Physical Exam  Constitutional: He is oriented to person, place, and time. He appears well-developed and well-nourished. No distress.  HENT:  Head: Normocephalic. Head is with abrasion.    2 centimeter diameter abrasion without active bleeding. No surrounding hematoma. No obvious injury to the scalp elsewhere. No tenderness to palpation of the scalp.  Eyes: Pupils are equal, round, and reactive to light. Conjunctivae and EOM are normal.  Neck: Normal range of motion.  No tenderness to palpation of midline cervical spine  Cardiovascular: Normal rate, regular rhythm and intact distal pulses.   Pulmonary/Chest: Effort normal and breath sounds normal. No respiratory distress. He has no wheezes.  Abdominal: Soft. He exhibits no distension. There is no tenderness.  Musculoskeletal: Normal range of motion.  No tenderness to palpation of midline spine. Patient moving all extremities without difficulty. Upper and lower extremities strength intact bilaterally. Sensation intact bilaterally. Pedal and radial pulses intact bilaterally. No obvious injury, laceration, contusion outside of the head.  Neurological: He is alert and oriented to person, place, and time. He has normal strength. No cranial nerve deficit or sensory deficit. He displays a negative Romberg sign. GCS eye subscore is 4. GCS verbal subscore is 5. GCS motor subscore is 6.  Fine movement and  coordination intact  Skin: Skin is warm and dry.  Psychiatric: He has a normal mood and affect.  Nursing note and vitals reviewed.    ED Treatments / Results  Labs (all labs ordered are listed, but only abnormal results are displayed) Labs Reviewed - No data to display  EKG  EKG Interpretation None       Radiology Ct Head Wo Contrast  Result Date: 06/27/2017 CLINICAL DATA:  Status post fall in yard. Hit front of head on chicken nest box. Forehead laceration. Initial encounter. EXAM: CT HEAD WITHOUT CONTRAST CT CERVICAL SPINE WITHOUT CONTRAST TECHNIQUE: Multidetector CT imaging of the head and cervical spine was performed following the standard protocol without intravenous contrast. Multiplanar CT image reconstructions of the cervical spine were also generated. COMPARISON:  CTA of the head neck performed 06/20/2016, and MRI of the brain and cervical spine performed 12/13/2015 FINDINGS: CT HEAD FINDINGS Brain: No evidence of acute infarction, hemorrhage, hydrocephalus, extra-axial collection or mass lesion/mass effect. Prominence of the ventricles and sulci reflects  mild cortical volume loss. Mild cerebellar atrophy is noted. Mild periventricular white matter change likely reflects small vessel ischemic microangiopathy. The brainstem and fourth ventricle are within normal limits. The basal ganglia are unremarkable in appearance. The cerebral hemispheres demonstrate grossly normal gray-white differentiation. No mass effect or midline shift is seen. Vascular: No hyperdense vessel or unexpected calcification. Skull: There is no evidence of fracture; visualized osseous structures are unremarkable in appearance. Sinuses/Orbits: The orbits are within normal limits. Mucosal thickening is noted at the left maxillary sinus. The remaining paranasal sinuses and mastoid air cells are well-aerated. Other: Soft tissue swelling is noted overlying the left frontal calvarium. CT CERVICAL SPINE FINDINGS Alignment:  Normal. Skull base and vertebrae: No acute fracture. No primary bone lesion or focal pathologic process. Soft tissues and spinal canal: No prevertebral fluid or swelling. No visible canal hematoma. Disc levels: Intervertebral disc space narrowing is noted at C5-C6, with scattered anterior and posterior disc osteophyte complexes, and mild underlying facet disease. Upper chest: The thyroid gland is unremarkable in appearance. Scattered calcification is noted at the carotid bifurcations bilaterally. Other: No additional soft tissue abnormalities are seen. IMPRESSION: 1. No evidence of traumatic intracranial injury or fracture. 2. No evidence of fracture or subluxation along the cervical spine. 3. Soft tissue swelling overlying the left frontal calvarium. 4. Mild cortical volume loss and scattered small vessel ischemic microangiopathy. 5. Mild degenerative change at the mid cervical spine. 6. Mucosal thickening at the left maxillary sinus. 7. Calcification at the carotid bifurcations bilaterally. Carotid ultrasound would be helpful for further evaluation, when and as deemed clinically appropriate. Electronically Signed   By: Roanna Raider M.D.   On: 06/27/2017 19:18   Ct Cervical Spine Wo Contrast  Result Date: 06/27/2017 CLINICAL DATA:  Status post fall in yard. Hit front of head on chicken nest box. Forehead laceration. Initial encounter. EXAM: CT HEAD WITHOUT CONTRAST CT CERVICAL SPINE WITHOUT CONTRAST TECHNIQUE: Multidetector CT imaging of the head and cervical spine was performed following the standard protocol without intravenous contrast. Multiplanar CT image reconstructions of the cervical spine were also generated. COMPARISON:  CTA of the head neck performed 06/20/2016, and MRI of the brain and cervical spine performed 12/13/2015 FINDINGS: CT HEAD FINDINGS Brain: No evidence of acute infarction, hemorrhage, hydrocephalus, extra-axial collection or mass lesion/mass effect. Prominence of the ventricles and  sulci reflects mild cortical volume loss. Mild cerebellar atrophy is noted. Mild periventricular white matter change likely reflects small vessel ischemic microangiopathy. The brainstem and fourth ventricle are within normal limits. The basal ganglia are unremarkable in appearance. The cerebral hemispheres demonstrate grossly normal gray-white differentiation. No mass effect or midline shift is seen. Vascular: No hyperdense vessel or unexpected calcification. Skull: There is no evidence of fracture; visualized osseous structures are unremarkable in appearance. Sinuses/Orbits: The orbits are within normal limits. Mucosal thickening is noted at the left maxillary sinus. The remaining paranasal sinuses and mastoid air cells are well-aerated. Other: Soft tissue swelling is noted overlying the left frontal calvarium. CT CERVICAL SPINE FINDINGS Alignment: Normal. Skull base and vertebrae: No acute fracture. No primary bone lesion or focal pathologic process. Soft tissues and spinal canal: No prevertebral fluid or swelling. No visible canal hematoma. Disc levels: Intervertebral disc space narrowing is noted at C5-C6, with scattered anterior and posterior disc osteophyte complexes, and mild underlying facet disease. Upper chest: The thyroid gland is unremarkable in appearance. Scattered calcification is noted at the carotid bifurcations bilaterally. Other: No additional soft tissue abnormalities are seen. IMPRESSION: 1.  No evidence of traumatic intracranial injury or fracture. 2. No evidence of fracture or subluxation along the cervical spine. 3. Soft tissue swelling overlying the left frontal calvarium. 4. Mild cortical volume loss and scattered small vessel ischemic microangiopathy. 5. Mild degenerative change at the mid cervical spine. 6. Mucosal thickening at the left maxillary sinus. 7. Calcification at the carotid bifurcations bilaterally. Carotid ultrasound would be helpful for further evaluation, when and as deemed  clinically appropriate. Electronically Signed   By: Roanna Raider M.D.   On: 06/27/2017 19:18    Procedures Procedures (including critical care time)  Medications Ordered in ED Medications  bacitracin ointment (1 application Topical Given 06/27/17 1933)     Initial Impression / Assessment and Plan / ED Course  I have reviewed the triage vital signs and the nursing notes.  Pertinent labs & imaging results that were available during my care of the patient were reviewed by me and considered in my medical decision making (see chart for details).     Patient presenting with abrasion to the forehead after mechanical fall in which he had his head. Instructed in the emergency room for CT scan. Physical exam reassuring, as patient without neurologic deficits. Abrasion of forehead not actively bleeding, and will not require sutures. CT head and neck ordered. No obvious injury elsewhere, and patient denying pain elsewhere.  CT head and neck negative for bleed, fracture, dislocation. Case discussed with attending, and Dr. Denton Lank evaluated the patient. At this time, patient appears safe for discharge. Return precautions given. Patient and wife state they understand and agree to plan.   Final Clinical Impressions(s) / ED Diagnoses   Final diagnoses:  Fall, initial encounter  Laceration of scalp, initial encounter    New Prescriptions Discharge Medication List as of 06/27/2017  7:28 PM       Alveria Apley, PA-C 06/28/17 0106    Cathren Laine, MD 06/28/17 1652

## 2017-07-01 ENCOUNTER — Ambulatory Visit (INDEPENDENT_AMBULATORY_CARE_PROVIDER_SITE_OTHER): Payer: Medicare Other | Admitting: Neurology

## 2017-07-01 ENCOUNTER — Ambulatory Visit: Payer: Medicare Other | Admitting: Neurology

## 2017-07-01 ENCOUNTER — Encounter: Payer: Self-pay | Admitting: Neurology

## 2017-07-01 VITALS — BP 111/60 | HR 71 | Ht 70.0 in | Wt 196.8 lb

## 2017-07-01 DIAGNOSIS — Z961 Presence of intraocular lens: Secondary | ICD-10-CM | POA: Diagnosis not present

## 2017-07-01 DIAGNOSIS — F0280 Dementia in other diseases classified elsewhere without behavioral disturbance: Secondary | ICD-10-CM | POA: Diagnosis not present

## 2017-07-01 DIAGNOSIS — H402211 Chronic angle-closure glaucoma, right eye, mild stage: Secondary | ICD-10-CM | POA: Diagnosis not present

## 2017-07-01 DIAGNOSIS — G301 Alzheimer's disease with late onset: Secondary | ICD-10-CM | POA: Diagnosis not present

## 2017-07-01 DIAGNOSIS — H402223 Chronic angle-closure glaucoma, left eye, severe stage: Secondary | ICD-10-CM | POA: Diagnosis not present

## 2017-07-01 DIAGNOSIS — H2511 Age-related nuclear cataract, right eye: Secondary | ICD-10-CM | POA: Diagnosis not present

## 2017-07-01 NOTE — Patient Instructions (Signed)
I had a long discussion with the patient and his wife regarding his mild dementia which appears to be stable. Continue Namenda 10 mg twice daily for dementia which is more or less stable.   Continue Aggrenox for stroke prevention with strict control of hypertension with blood pressure goal below 130/90, lipids with LDL cholesterol goal below 70 mg percent and diabetes with hemoglobin A1c goal below 6.5. Patient may also consider participation in [employer] trial if interested. They were given information to review at home. . Patient does have mild resting tremor which does not appear to be functionally disabling and he lacks other features of Parkinson's and hence we'll hold off on medications for that at the current time. Return for follow-up in 6 months or call earlier if necessary

## 2017-07-01 NOTE — Progress Notes (Signed)
[facility]  PATIENT: Daniel Morrow DOB: 10-19-1940   REASON FOR VISIT: Follow-up for visual loss in left eye, dementia HISTORY FROM:Patient and wife    HISTORY OF PRESENT ILLNESS:UPDATE 06/03/16 CM. Daniel Morrow, 76 year old male returns for acute visit. He was seen in the office for dementia in the past by Dr. Pearlean Brownie.. Wife called in after appointment with ophthalmologist yesterday. Dr. Precious Bard had attempted to call Dr. Pearlean Brownie yesterday with visual changes on patient's exam. He complains of visual loss left eye, uncertain how long this has been going on according to the patient and wife. He has not noted any changes. He denies any double vision or headache. He has a history of stroke in 2013 is currently on Aggrenox. He had an ER visit on 05/05/2016 for palpitations. He has not seen cardiology yet but has an appointment. He also has a history of dementia and is on Namenda twice daily. Denies any recent falls. He denies any hallucinations or delusions. Denies any weakness of one side of the body or the other. Denies any sensory changes. Denies any changes in speech or increased confusion. He returns for reevaluation   Update 03/27/2016 : He returns for follow-up after last visit 2 and half months ago. He is accompanied by his wife who states that he continues to have cognitive difficulties with short-term memory, remembering recent events and names. He was unable to tolerate Aricept which was discontinued and has been able to tolerated Namenda 10 mg twice daily. Wife feels that he is depressed a lot and is not socially outgoing. He gets disoriented from time to time. He is had some anger issues and agitation as well. Patient has also been having disturbed sleep and wife suspect that his sleep talking. He had 2 instances of urination on the bathroom mat. Patient has mild tremors in his left hand. He was started on depression medication by his primary physician but it did not help and was  discontinued. He does have mild drooling and has overall slowdown in his activities. His balance and gait are good and he is not had any falls. There have been no hallucinations or delusions. [name] decided not to discontinued Aggrenox and he is still on it. Update 01/15/2016 : Patient returns for follow-up today after last visit more than 2 and half years ago. Is accompanied by his daughter. Patient was seen in image and 0 month 12/12/68 and 1 episode of confusion and his orientation. He woke up that day and could not remember events for the past few days and was quite confused. He also complained of posterior neck pain. He had lifted some weights the day before as there was some damage to his roof from his stump. The patient's daughter is accompanying him today states that he is had some mild intermittent confusion, short-term memory issues as well as more recently word finding difficulties in completing sentences. He is living at home with his wife. He had positive cerebellar infarct 3 years ago and since then did have mild short-term memory difficulties. He was in fact started on Aricept 5 mg which he seems to tolerating well but family has noticed some fine action tremor of his hands. Initially the thought this was related to his depression medication which has been discontinued but the tremors have persisted. The tremors in both hands more in the left they're absent at rest. It did not interfere with his day-to-day activities. He has not had any trouble with gait, balance,  drooling of saliva or bradykinesia. Patient recently had an upper respiratory tract infection and cough and when seen in the emergency room on 3/23//2017 and his oxygen levels were found to be low. He improved after giving oxygen as well as inhalers. MRI scan of the brain was obtained which I personally reviewed shows mild generalized atrophy and old cerebellar infarct no acute findings were noted. Urine analysis was normal and  CBC and comprehensive metabolic panel labs were unremarkable. Patient remains on Aggrenox for stroke prevention but states that it is too expensive and would like to switch to a cheaper alternative. His blood pressure is well controlled and today it is 131/71. He is tolerating Lipitor well without muscle aches and pains. He states his blood sugars have all been under good control. Patient denies any recent falls or head injury. No history of seizures. There is no family history of dementia. He has not had any unsafe behavior, agitation, hallucinations, delusions. Update 07/01/2016 : He returns for follow-up after last visit 06/03/16 with Daniel Morrow, nurse practitioner. Is accompanied by his wife. His left eye vision loss continues. He is being seen by Dr. Precious Bard ophthalmologist in  [place]. His been started on some eyedrops. Head does have a follow-up appointment to see him soon. Patient and wife were bothered by his tremors more now. The tremors are intermittent present more in the left hand than the right. He has noticed trouble with his writing to visit with [place] signed. He is able to hold a cup of tea and coffee and drinks without spilling. Patient continues to have mild short-term memory difficulties but these appear to be unchanged and are not progressive. He still needs some these help with a few activities by his wife but can manage most things by himself. He recently seen cardiologist Dr. Chales Abrahams who has asked him to have an external monitor. He did have an echocardiogram which was fine. Patient did undergo CT angiogram of the brain and neck on 9/29 /17 which was ordered at the last visit and it does not show any significant extracranial or intracranial stenosis to explain his vision loss. Update 12/30/2016 ; he returns for follow-up today accompanied by his wife falling last visit 6 months ago. He continues to do well without recurrent TIA or stroke symptoms. Continues to have memory difficulties.  This has been ongoing for the last 2 years but seems relatively stable recently. He is tolerating Namenda 10 mg twice daily daily without any side effects. His this difficulty in following directions occasionally forgets to take his medications and wife has to give him. Otherwise is quite independent with most activities of daily living for himself. Remains on Aggrenox which is tolerating well without side effects. His blood pressure is well controlled and today it is 110/60. His sugars also well controlled. The patient continues to have mild tremors in his hand and wife feels they have improved after the dose of Zoloft was reduced to 25 mg. He denies significant bradykinesia , difficultygetting out of bed problems with gait imbalance of fall falling easily. He has persistent left eye vision loss Update 07/01/2017 : He returns for follow-up after last visit 6 months ago. Is accompanied by his wife. Patient has not had any recurrent stroke or TIA symptoms. The patient wife feels that he is having more difficulty with finding words and speaking. On Mini-Mental status exam today scored 23/30 which is not significantly changed from last visit and scored 24/30. The patient states at times the  right words don't come and unable to remember names but later on in the remember it. He is on Namenda 10 mg twice daily which is tolerating well without side effects. In the past he has been unable to tolerate Aricept and didn't work as well. The patient denies any delusions, hallucinations, unsafe behavior. He occasionally gets irritable but has not had significant agitation. He had a fall last Saturday and was seen in the urgent care. CT scan of the head and cervical spine personally reviewed showed no bony fractures or acute abnormality. He has a mild left frontal abrasion which appears to be healing. REVIEW OF SYSTEMS: Full 14 system review of systems performed and notable only for those listed, all others are neg: Hand  tremor, memory loss, gait imbalance  and all other systems negative    ALLERGIES: No Known Allergies  HOME MEDICATIONS: Outpatient Medications Prior to Visit  Medication Sig Dispense Refill  . atorvastatin (LIPITOR) 40 MG tablet Take 40 mg by mouth daily.    . benazepril (LOTENSIN) 10 MG tablet Take 10 mg by mouth daily.    Marland Kitchen BYDUREON 2 MG PEN   1  . dipyridamole-aspirin (AGGRENOX) 200-25 MG 12hr capsule Take 1 capsule by mouth 2 (two) times daily. 60 capsule 3  . fluticasone (FLONASE) 50 MCG/ACT nasal spray   0  . glimepiride (AMARYL) 4 MG tablet TK 1 T PO  WITH BRE OR THE FIRST MAIN MEAL OF THE DAY ONCE D .  3  . JARDIANCE 25 MG TABS tablet   2  . latanoprost (XALATAN) 0.005 % ophthalmic solution Place 1 drop into both eyes at bedtime.   5  . memantine (NAMENDA) 10 MG tablet TAKE 1 TABLET BY MOUTH 2 TIMES DAILY. 180 tablet 3  . Multiple Vitamin (MULTIVITAMIN WITH MINERALS) TABS tablet Take 1 tablet by mouth daily.    . pantoprazole (PROTONIX) 40 MG tablet Take 40 mg by mouth daily.    Marland Kitchen PE-CPM-DM-GG-APAP PO Take by mouth as needed.     . sertraline (ZOLOFT) 100 MG tablet Take 1 tablet (100 mg total) by mouth daily. 90 tablet 2  . tamsulosin (FLOMAX) 0.4 MG CAPS capsule   2  . albuterol (PROVENTIL) (2.5 MG/3ML) 0.083% nebulizer solution Take 3 mLs (2.5 mg total) by nebulization every 4 (four) hours as needed for wheezing or shortness of breath. 30 vial 0  . empagliflozin (JARDIANCE) 10 MG TABS tablet Take by mouth.    . metFORMIN (GLUCOPHAGE) 1000 MG tablet Take 1,000 mg by mouth daily with breakfast.   2   No facility-administered medications prior to visit.     PAST MEDICAL HISTORY: Past Medical History:  Diagnosis Date  . Diabetes mellitus   . Hyperlipidemia   . Hypertension   . Stroke [facility])     PAST SURGICAL HISTORY: Past Surgical History:  Procedure Laterality Date  . KNEE ARTHROSCOPY    . NECK SURGERY      FAMILY HISTORY: No family history on file.  SOCIAL  HISTORY: Social History   Social History  . Marital status: Married    Spouse name: [name]  . Number of children: 3  . Years of education: College   Occupational History  . Retired    Social History Main Topics  . Smoking status: Former Games developer  . Smokeless tobacco: Never Used  . Alcohol use No  . Drug use: No  . Sexual activity: Not on file   Other Topics Concern  . Not on file  Social History Narrative   Patient lives at home with family.   Caffeine Use: 2 cups daily     PHYSICAL EXAM  Vitals:   07/01/17 1102  BP: 111/60  Pulse: 71  Weight: 196 lb 12.8 oz (89.3 kg)  Height:  (1.778 m)   Body mass index is 28.24 kg/m. General: well developed, well nourished Elderly Caucasian male, seated, in no evident distress Head: head normocephalic and atraumatic.   Neck: supple with no carotid  bruits Cardiovascular: regular rate and rhythm, no murmurs Musculoskeletal: no deformity Skin: no rash/petichiae. Band-Aid over left frontal region however abrasion from recent fall Vascular: Normal pulses all extremities    Neurological examination   Mentation: Alert oriented to time, place, history taking. MMSE 23/30 ( last visit24/30) Diminished recall 2/3. AFT 9. Clock drawing 4/4. Marland Kitchen Unable to copy intersecting pentagons. Attention span and concentration appropriate. .  Follows all commands speech and language fluent.  Cranial nerve II-XII: Fundoscopic exam not done. Diminished vision acuity on left. And can count fingers at 2 fet only. Left pupil slow  to react, right pupil  equal round reactive to light.  extraocular movements were full, visual field were full on confrontational test on the right only. Facial sensation and strength were normal. hearing was intact to finger rubbing bilaterally. Uvula tongue midline. head turning and shoulder shrug were normal and symmetric.Tongue protrusion into cheek strength was normal. Motor: normal bulk and tone, full strength in the  BUE, BLE, fine finger movements normal, no pronator drift. No focal weakness. Very mild intermittent tremor of the left hand when he begins to walk. Mild cogwheel rigidity in both wrists upon activation only. No bradykinesia. Able to rise from a chair with arms folded across her chest. Positive glabellar tap. Diminished facial expression. No retropulsion with good posture and balance. Sensory: normal and symmetric to light touch, pinprick, and  Vibration in  the upper and lower extremities Coordination: finger-nose-finger, heel-to-shin bilaterally, no dysmetria Reflexes 1+ upper lower and symmetric  plantar responses were flexor bilaterally. Gait and Station: Rising up from seated position without assistance, normal stance,  moderate stride, good arm swing, smooth turning, able to perform tiptoe, and heel walking without difficulty. Tandem gait is steady  DIAGNOSTIC DATA (LABS, IMAGING, TESTING) - I reviewed patient records, labs, notes, testing and imaging myself where available.  Lab Results  Component Value Date   WBC 5.7 05/05/2016   HGB 11.9 (L) 05/05/2016   HCT 38.6 (L) 05/05/2016   MCV 88.3 05/05/2016   PLT 248 05/05/2016      Component Value Date/Time   NA 144 06/03/2016 1240   K 5.1 06/03/2016 1240   CL 102 06/03/2016 1240   CO2 26 06/03/2016 1240   GLUCOSE 135 (H) 06/03/2016 1240   GLUCOSE 240 (H) 05/05/2016 0003   BUN 18 06/03/2016 1240   CREATININE 1.10 06/03/2016 1240   CALCIUM 9.7 06/03/2016 1240   PROT 7.2 12/13/2015 1002   ALBUMIN 4.1 12/13/2015 1002   AST 33 12/13/2015 1002   ALT 30 12/13/2015 1002   ALKPHOS 75 12/13/2015 1002   BILITOT 1.2 12/13/2015 1002   GFRNONAA 66 06/03/2016 1240   GFRAA 77 06/03/2016 1240     Lab Results  Component Value Date   VITAMINB12 448 01/16/2016   Lab Results  Component Value Date   TSH 3.130 01/16/2016    ASSESSMENT AND PLAN  76 y.o. year old male  has a past medical history of Diabetes mellitus; Hyperlipidemia;  Hypertension;  and Stroke [facility]). here to follow-up. 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 But negative neurovascular workup. Mild memory and cognitive difficulties secondary to mild Alzheimer`s dementia.which appears stableMild left upper extremity resting and action tremor but not functionally disabling PLAN:    I had a long discussion with the patient and his wife regarding his mild dementia which appears to be stable. Continue Namenda 10 mg twice daily for dementia which is more or less stable.   Continue Aggrenox for stroke prevention with strict control of hypertension with blood pressure goal below 130/90, lipids with LDL cholesterol goal below 70 mg percent and diabetes with hemoglobin A1c goal below 6.5. Patient may also consider participation in GRADUATEdementia trial if interested. They were given information to review at home. . Patient does have mild resting tremor which does not appear to be functionally disabling and he lacks other features of Parkinson's and hence we'll hold off on medications for that at the current time. Return for follow-up in 6 months or call earlier if necessary.Greater than 50% time during this 30 minute visit was spent on counseling and coordination of care about his cognitive impairment dementia tremors and remote stroke Delia Heady, MD [facility] [address] [place], [place] [address] [phone]

## 2017-07-13 DIAGNOSIS — E1165 Type 2 diabetes mellitus with hyperglycemia: Secondary | ICD-10-CM | POA: Diagnosis not present

## 2017-07-13 DIAGNOSIS — E785 Hyperlipidemia, unspecified: Secondary | ICD-10-CM | POA: Diagnosis not present

## 2017-07-13 DIAGNOSIS — Z0000 Encounter for general adult medical examination without abnormal findings: Secondary | ICD-10-CM | POA: Diagnosis not present

## 2017-07-13 DIAGNOSIS — Z1389 Encounter for screening for other disorder: Secondary | ICD-10-CM | POA: Diagnosis not present

## 2017-07-13 DIAGNOSIS — I10 Essential (primary) hypertension: Secondary | ICD-10-CM | POA: Diagnosis not present

## 2017-07-13 DIAGNOSIS — E119 Type 2 diabetes mellitus without complications: Secondary | ICD-10-CM | POA: Diagnosis not present

## 2017-07-13 DIAGNOSIS — F0390 Unspecified dementia without behavioral disturbance: Secondary | ICD-10-CM | POA: Diagnosis not present

## 2017-07-13 DIAGNOSIS — Z23 Encounter for immunization: Secondary | ICD-10-CM | POA: Diagnosis not present

## 2017-07-16 ENCOUNTER — Other Ambulatory Visit: Payer: Self-pay | Admitting: Neurology

## 2017-07-22 DIAGNOSIS — N182 Chronic kidney disease, stage 2 (mild): Secondary | ICD-10-CM | POA: Diagnosis not present

## 2017-07-22 DIAGNOSIS — E1122 Type 2 diabetes mellitus with diabetic chronic kidney disease: Secondary | ICD-10-CM | POA: Diagnosis not present

## 2017-07-22 DIAGNOSIS — E1142 Type 2 diabetes mellitus with diabetic polyneuropathy: Secondary | ICD-10-CM | POA: Diagnosis not present

## 2017-07-22 DIAGNOSIS — E1165 Type 2 diabetes mellitus with hyperglycemia: Secondary | ICD-10-CM | POA: Diagnosis not present

## 2017-08-05 DIAGNOSIS — K219 Gastro-esophageal reflux disease without esophagitis: Secondary | ICD-10-CM | POA: Diagnosis not present

## 2017-08-05 DIAGNOSIS — R251 Tremor, unspecified: Secondary | ICD-10-CM | POA: Diagnosis not present

## 2017-08-05 DIAGNOSIS — F418 Other specified anxiety disorders: Secondary | ICD-10-CM | POA: Diagnosis not present

## 2017-08-05 DIAGNOSIS — I10 Essential (primary) hypertension: Secondary | ICD-10-CM | POA: Diagnosis not present

## 2017-08-06 DIAGNOSIS — M25562 Pain in left knee: Secondary | ICD-10-CM | POA: Diagnosis not present

## 2017-08-06 DIAGNOSIS — M25462 Effusion, left knee: Secondary | ICD-10-CM | POA: Diagnosis not present

## 2017-08-06 DIAGNOSIS — M1712 Unilateral primary osteoarthritis, left knee: Secondary | ICD-10-CM | POA: Diagnosis not present

## 2017-08-10 DIAGNOSIS — D224 Melanocytic nevi of scalp and neck: Secondary | ICD-10-CM | POA: Diagnosis not present

## 2017-08-10 DIAGNOSIS — L570 Actinic keratosis: Secondary | ICD-10-CM | POA: Diagnosis not present

## 2017-08-16 DIAGNOSIS — E1122 Type 2 diabetes mellitus with diabetic chronic kidney disease: Secondary | ICD-10-CM | POA: Diagnosis not present

## 2017-08-26 DIAGNOSIS — H402223 Chronic angle-closure glaucoma, left eye, severe stage: Secondary | ICD-10-CM | POA: Diagnosis not present

## 2017-08-26 DIAGNOSIS — H402211 Chronic angle-closure glaucoma, right eye, mild stage: Secondary | ICD-10-CM | POA: Diagnosis not present

## 2017-09-04 DIAGNOSIS — R296 Repeated falls: Secondary | ICD-10-CM | POA: Diagnosis not present

## 2017-09-04 DIAGNOSIS — F0390 Unspecified dementia without behavioral disturbance: Secondary | ICD-10-CM | POA: Diagnosis not present

## 2017-09-24 DIAGNOSIS — L570 Actinic keratosis: Secondary | ICD-10-CM | POA: Diagnosis not present

## 2017-09-24 DIAGNOSIS — D044 Carcinoma in situ of skin of scalp and neck: Secondary | ICD-10-CM | POA: Diagnosis not present

## 2017-10-06 DIAGNOSIS — E1122 Type 2 diabetes mellitus with diabetic chronic kidney disease: Secondary | ICD-10-CM | POA: Diagnosis not present

## 2017-10-07 DIAGNOSIS — M25562 Pain in left knee: Secondary | ICD-10-CM | POA: Diagnosis not present

## 2017-10-07 DIAGNOSIS — G252 Other specified forms of tremor: Secondary | ICD-10-CM | POA: Diagnosis not present

## 2017-10-07 DIAGNOSIS — I129 Hypertensive chronic kidney disease with stage 1 through stage 4 chronic kidney disease, or unspecified chronic kidney disease: Secondary | ICD-10-CM | POA: Diagnosis not present

## 2017-10-07 DIAGNOSIS — M47812 Spondylosis without myelopathy or radiculopathy, cervical region: Secondary | ICD-10-CM | POA: Diagnosis not present

## 2017-10-07 DIAGNOSIS — E1122 Type 2 diabetes mellitus with diabetic chronic kidney disease: Secondary | ICD-10-CM | POA: Diagnosis not present

## 2017-10-07 DIAGNOSIS — F0390 Unspecified dementia without behavioral disturbance: Secondary | ICD-10-CM | POA: Diagnosis not present

## 2017-10-26 ENCOUNTER — Telehealth: Payer: Self-pay | Admitting: Neurology

## 2017-10-26 NOTE — Telephone Encounter (Signed)
Pt is currently on dipyridamole-aspirin (AGGRENOX) 200-25 MG 12hr capsule but is too expensive. Pts wife is calling wanting to know if there is anything we can switched it to or if pt can take aspirin sperate from the dipyridamloe(she was told by someone that would be cheaper)

## 2017-10-26 NOTE — Telephone Encounter (Signed)
Rn call patients wife about aspirin aggrenox being over 400.00 dollars. [name] states the husband other medications are very costly too. She was told by pts insurance that just taking the dipyridamloe alone would be 300.00 for 90 day supply at one pill. She wanted to know if the dipyridamloe without the aspirin contents could be prescribed. Also could the pt take a 81mg  aspirin or 325 aspirin in addition to the dipyridamloe. Rn stated a message will be sent to Dr. Pearlean BrownieSethi to see if this allowable or safe for the patient because of the stroke in the past. PTs wife verbalized understanding. Rn explain Dr Pearlean BrownieSEthi is working in the hospital this week.

## 2017-10-27 NOTE — Telephone Encounter (Signed)
Rn call Damian Leavellrudy pts wife about Dr. Pearlean BrownieSethi respond to her not being able to afford the aspriin.aggrenox. The wife stated she call the mail order and was told it will be 295.00 for a 3 month supply. Pts wife stated she has already paid for it, and order the medication for 295.00 for 3 months. Rn stated Dr. Pearlean BrownieSethi does not recommend dipyridamole being prescribed by itself, with taking an additional aspirin. Rn stated Dr. Pearlean BrownieSethi recommend 325 aspirin OTC, because last episode was 05/2016, and stroke was 2013. Rn stated to wife in the future if her husband cannot afford the aspirin aggrenox take 325 aspirin otc. Rn recommend she can call the office,and we make the change on his medication list., Trudy verbalized understanding, and will call if they cannot  afford the prescription aspirin/aggrenox,take the aspirin 325.

## 2017-10-27 NOTE — Telephone Encounter (Signed)
Kindly let him know that I have reviewed his chart. His last episode of CRAO was in September 2017 and is outside high risk recurrence period and cannot afford aggrenox hence he can stop it and take aspirin 325mg  daily instead.

## 2017-11-05 DIAGNOSIS — E1165 Type 2 diabetes mellitus with hyperglycemia: Secondary | ICD-10-CM | POA: Diagnosis not present

## 2017-11-05 DIAGNOSIS — N182 Chronic kidney disease, stage 2 (mild): Secondary | ICD-10-CM | POA: Diagnosis not present

## 2017-11-05 DIAGNOSIS — E1122 Type 2 diabetes mellitus with diabetic chronic kidney disease: Secondary | ICD-10-CM | POA: Diagnosis not present

## 2017-11-05 DIAGNOSIS — E1142 Type 2 diabetes mellitus with diabetic polyneuropathy: Secondary | ICD-10-CM | POA: Diagnosis not present

## 2017-11-09 DIAGNOSIS — R269 Unspecified abnormalities of gait and mobility: Secondary | ICD-10-CM | POA: Diagnosis not present

## 2017-11-09 DIAGNOSIS — N401 Enlarged prostate with lower urinary tract symptoms: Secondary | ICD-10-CM | POA: Diagnosis not present

## 2017-11-09 DIAGNOSIS — R296 Repeated falls: Secondary | ICD-10-CM | POA: Diagnosis not present

## 2017-11-09 DIAGNOSIS — F0390 Unspecified dementia without behavioral disturbance: Secondary | ICD-10-CM | POA: Diagnosis not present

## 2017-11-16 DIAGNOSIS — E1165 Type 2 diabetes mellitus with hyperglycemia: Secondary | ICD-10-CM | POA: Diagnosis not present

## 2017-11-16 DIAGNOSIS — N400 Enlarged prostate without lower urinary tract symptoms: Secondary | ICD-10-CM | POA: Diagnosis not present

## 2017-11-21 DIAGNOSIS — J189 Pneumonia, unspecified organism: Secondary | ICD-10-CM | POA: Diagnosis not present

## 2017-11-21 DIAGNOSIS — J0100 Acute maxillary sinusitis, unspecified: Secondary | ICD-10-CM | POA: Diagnosis not present

## 2017-11-27 DIAGNOSIS — R091 Pleurisy: Secondary | ICD-10-CM | POA: Diagnosis not present

## 2017-12-16 DIAGNOSIS — E1122 Type 2 diabetes mellitus with diabetic chronic kidney disease: Secondary | ICD-10-CM | POA: Diagnosis not present

## 2017-12-21 ENCOUNTER — Ambulatory Visit
Admission: RE | Admit: 2017-12-21 | Discharge: 2017-12-21 | Disposition: A | Discharge status: Home / Self Care | Payer: Medicare Other | Source: Ambulatory Visit | Attending: Internal Medicine | Admitting: Internal Medicine

## 2017-12-21 ENCOUNTER — Other Ambulatory Visit: Payer: Self-pay | Admitting: Internal Medicine

## 2017-12-21 DIAGNOSIS — J189 Pneumonia, unspecified organism: Secondary | ICD-10-CM | POA: Diagnosis not present

## 2017-12-21 DIAGNOSIS — B3749 Other urogenital candidiasis: Secondary | ICD-10-CM | POA: Diagnosis not present

## 2017-12-21 DIAGNOSIS — B002 Herpesviral gingivostomatitis and pharyngotonsillitis: Secondary | ICD-10-CM | POA: Diagnosis not present

## 2017-12-21 DIAGNOSIS — R05 Cough: Secondary | ICD-10-CM | POA: Diagnosis not present

## 2018-01-11 ENCOUNTER — Encounter: Payer: Self-pay | Admitting: Neurology

## 2018-01-11 ENCOUNTER — Telehealth: Payer: Self-pay

## 2018-01-11 ENCOUNTER — Ambulatory Visit: Payer: Medicare Other | Admitting: Neurology

## 2018-01-11 NOTE — Telephone Encounter (Signed)
Patient no show for appt today. 

## 2018-01-19 DIAGNOSIS — F0390 Unspecified dementia without behavioral disturbance: Secondary | ICD-10-CM | POA: Diagnosis not present

## 2018-01-19 DIAGNOSIS — I10 Essential (primary) hypertension: Secondary | ICD-10-CM | POA: Diagnosis not present

## 2018-01-19 DIAGNOSIS — E785 Hyperlipidemia, unspecified: Secondary | ICD-10-CM | POA: Diagnosis not present

## 2018-01-19 DIAGNOSIS — E1165 Type 2 diabetes mellitus with hyperglycemia: Secondary | ICD-10-CM | POA: Diagnosis not present

## 2018-01-20 DIAGNOSIS — H402211 Chronic angle-closure glaucoma, right eye, mild stage: Secondary | ICD-10-CM | POA: Diagnosis not present

## 2018-01-20 DIAGNOSIS — H402223 Chronic angle-closure glaucoma, left eye, severe stage: Secondary | ICD-10-CM | POA: Diagnosis not present

## 2018-02-04 DIAGNOSIS — F0390 Unspecified dementia without behavioral disturbance: Secondary | ICD-10-CM | POA: Diagnosis not present

## 2018-02-04 DIAGNOSIS — I10 Essential (primary) hypertension: Secondary | ICD-10-CM | POA: Diagnosis not present

## 2018-02-04 DIAGNOSIS — N182 Chronic kidney disease, stage 2 (mild): Secondary | ICD-10-CM | POA: Diagnosis not present

## 2018-02-04 DIAGNOSIS — E1165 Type 2 diabetes mellitus with hyperglycemia: Secondary | ICD-10-CM | POA: Diagnosis not present

## 2018-02-04 DIAGNOSIS — E1142 Type 2 diabetes mellitus with diabetic polyneuropathy: Secondary | ICD-10-CM | POA: Diagnosis not present

## 2018-02-04 DIAGNOSIS — I6930 Unspecified sequelae of cerebral infarction: Secondary | ICD-10-CM | POA: Diagnosis not present

## 2018-02-04 DIAGNOSIS — E1122 Type 2 diabetes mellitus with diabetic chronic kidney disease: Secondary | ICD-10-CM | POA: Diagnosis not present

## 2018-02-04 DIAGNOSIS — R159 Full incontinence of feces: Secondary | ICD-10-CM | POA: Diagnosis not present

## 2018-02-09 DIAGNOSIS — N182 Chronic kidney disease, stage 2 (mild): Secondary | ICD-10-CM | POA: Diagnosis not present

## 2018-02-09 DIAGNOSIS — E1142 Type 2 diabetes mellitus with diabetic polyneuropathy: Secondary | ICD-10-CM | POA: Diagnosis not present

## 2018-02-12 DIAGNOSIS — I10 Essential (primary) hypertension: Secondary | ICD-10-CM | POA: Diagnosis not present

## 2018-02-12 DIAGNOSIS — F0390 Unspecified dementia without behavioral disturbance: Secondary | ICD-10-CM | POA: Diagnosis not present

## 2018-02-12 DIAGNOSIS — E785 Hyperlipidemia, unspecified: Secondary | ICD-10-CM | POA: Diagnosis not present

## 2018-02-12 DIAGNOSIS — E1165 Type 2 diabetes mellitus with hyperglycemia: Secondary | ICD-10-CM | POA: Diagnosis not present

## 2018-02-23 ENCOUNTER — Encounter: Payer: Self-pay | Admitting: Neurology

## 2018-02-23 ENCOUNTER — Ambulatory Visit: Payer: Medicare Other | Admitting: Neurology

## 2018-02-23 VITALS — BP 130/71 | HR 67 | Ht 70.0 in | Wt 195.0 lb

## 2018-02-23 DIAGNOSIS — I6529 Occlusion and stenosis of unspecified carotid artery: Secondary | ICD-10-CM

## 2018-02-23 MED ORDER — ASPIRIN EC 325 MG PO TBEC: 325.0000 mg | DELAYED_RELEASE_TABLET | Freq: Every day | ORAL | 0 refills | Status: DC

## 2018-02-23 NOTE — Progress Notes (Signed)
[facility]  PATIENT: Daniel Morrow DOB: 09/14/1941   REASON FOR VISIT: Follow-up for visual loss in left eye, dementia HISTORY FROM:Patient and wife    HISTORY OF PRESENT ILLNESS:UPDATE 06/03/16 CM. [name], 77 year old male returns for acute visit. He was seen in the office for dementia in the past by Dr. Pearlean BrownieSethi.. Wife called in after appointment with ophthalmologist yesterday. Dr. Precious BardSnipes had attempted to call Dr. Pearlean BrownieSethi yesterday with visual changes on patient's exam. He complains of visual loss left eye, uncertain how long this has been going on according to the patient and wife. He has not noted any changes. He denies any double vision or headache. He has a history of stroke in 2013 is currently on Aggrenox. He had an ER visit on 05/05/2016 for palpitations. He has not seen cardiology yet but has an appointment. He also has a history of dementia and is on Namenda twice daily. Denies any recent falls. He denies any hallucinations or delusions. Denies any weakness of one side of the body or the other. Denies any sensory changes. Denies any changes in speech or increased confusion. He returns for reevaluation   Update 03/27/2016 : He returns for follow-up after last visit 2 and half months ago. He is accompanied by his wife who states that he continues to have cognitive difficulties with short-term memory, remembering recent events and names. He was unable to tolerate Aricept which was discontinued and has been able to tolerated Namenda 10 mg twice daily. Wife feels that he is depressed a lot and is not socially outgoing. He gets disoriented from time to time. He is had some anger issues and agitation as well. Patient has also been having disturbed sleep and wife suspect that his sleep talking. He had 2 instances of urination on the bathroom mat. Patient has mild tremors in his left hand. He was started on depression medication by his primary physician but it did not help and  was discontinued. He does have mild drooling and has overall slowdown in his activities. His balance and gait are good and he is not had any falls. There have been no hallucinations or delusions. [name] decided not to discontinued Aggrenox and he is still on it. Update 01/15/2016 : Patient returns for follow-up today after last visit more than 2 and half years ago. Is accompanied by his daughter. Patient was seen in image and 0 month 12/12/68 and 1 episode of confusion and his orientation. He woke up that day and could not remember events for the past few days and was quite confused. He also complained of posterior neck pain. He had lifted some weights the day before as there was some damage to his roof from his stump. The patient's daughter is accompanying him today states that he is had some mild intermittent confusion, short-term memory issues as well as more recently word finding difficulties in completing sentences. He is living at home with his wife. He had positive cerebellar infarct 3 years ago and since then did have mild short-term memory difficulties. He was in fact started on Aricept 5 mg which he seems to tolerating well but family has noticed some fine action tremor of his hands. Initially the thought this was related to his depression medication which has been discontinued but the tremors have persisted. The tremors in both hands more in the left they're absent at rest. It did not interfere with his day-to-day activities. He has not had any trouble with gait,  balance, drooling of saliva or bradykinesia. Patient recently had an upper respiratory tract infection and cough and when seen in the emergency room on 3/23//2017 and his oxygen levels were found to be low. He improved after giving oxygen as well as inhalers. MRI scan of the brain was obtained which I personally reviewed shows mild generalized atrophy and old cerebellar infarct no acute findings were noted. Urine analysis was normal  and CBC and comprehensive metabolic panel labs were unremarkable. Patient remains on Aggrenox for stroke prevention but states that it is too expensive and would like to switch to a cheaper alternative. His blood pressure is well controlled and today it is 131/71. He is tolerating Lipitor well without muscle aches and pains. He states his blood sugars have all been under good control. Patient denies any recent falls or head injury. No history of seizures. There is no family history of dementia. He has not had any unsafe behavior, agitation, hallucinations, delusions. Update 07/01/2016 : He returns for follow-up after last visit 06/03/16 with Latrelle Dodrill, nurse practitioner. Is accompanied by his wife. His left eye vision loss continues. He is being seen by Dr. Precious Bard ophthalmologist in  [place]. His been started on some eyedrops. Head does have a follow-up appointment to see him soon. Patient and wife were bothered by his tremors more now. The tremors are intermittent present more in the left hand than the right. He has noticed trouble with his writing to visit with [place] signed. He is able to hold a cup of tea and coffee and drinks without spilling. Patient continues to have mild short-term memory difficulties but these appear to be unchanged and are not progressive. He still needs some these help with a few activities by his wife but can manage most things by himself. He recently seen cardiologist Dr. Chales Abrahams who has asked him to have an external monitor. He did have an echocardiogram which was fine. Patient did undergo CT angiogram of the brain and neck on 9/29 /17 which was ordered at the last visit and it does not show any significant extracranial or intracranial stenosis to explain his vision loss. Update 12/30/2016 ; he returns for follow-up today accompanied by his wife falling last visit 6 months ago. He continues to do well without recurrent TIA or stroke symptoms. Continues to have memory difficulties.  This has been ongoing for the last 2 years but seems relatively stable recently. He is tolerating Namenda 10 mg twice daily daily without any side effects. His this difficulty in following directions occasionally forgets to take his medications and wife has to give him. Otherwise is quite independent with most activities of daily living for himself. Remains on Aggrenox which is tolerating well without side effects. His blood pressure is well controlled and today it is 110/60. His sugars also well controlled. The patient continues to have mild tremors in his hand and wife feels they have improved after the dose of Zoloft was reduced to 25 mg. He denies significant bradykinesia , difficultygetting out of bed problems with gait imbalance of fall falling easily. He has persistent left eye vision loss Update 07/01/2017 : He returns for follow-up after last visit 6 months ago. Is accompanied by his wife. Patient has not had any recurrent stroke or TIA symptoms. The patient wife feels that he is having more difficulty with finding words and speaking. On Mini-Mental status exam today scored 23/30 which is not significantly changed from last visit and scored 24/30. The patient states at times  the right words don't come and unable to remember names but later on in the remember it. He is on Namenda 10 mg twice daily which is tolerating well without side effects. In the past he has been unable to tolerate Aricept and didn't work as well. The patient denies any delusions, hallucinations, unsafe behavior. He occasionally gets irritable but has not had significant agitation. He had a fall last Saturday and was seen in the urgent care. CT scan of the head and cervical spine personally reviewed showed no bony fractures or acute abnormality. He has a mild left frontal abrasion which appears to be healing. Update 02/23/2018 ; he returns for follow-up after last visit 8 months ago.  He has not had recurrent stroke or TIA symptoms now  for several years.  He remains on extended release Depo-Medrol generic form but is finding it difficult to afford and wonders if he can be switched to aspirin.  Is tolerating Lipitor well without muscle aches and pains.  His blood pressure is well controlled.  Patient continues to have memory difficulties which wife feels may be progressing however he remains independent in activities of daily living.  He is tolerating Namenda 10 mg twice daily without any side effects.  Patient decided not to participate in the early dementia trial.  He states his main problem is his left knee which is bothering him.  He has not yet discussed this with primary physician Dr. Valentina Lucks but plans to do so soon and possibly seek a referral to orthopedic surgeon. REVIEW OF SYSTEMS: Full 14 system review of systems performed and notable only for those listed, all others are neg: Hand tremor, memory loss,   and all other systems negative    ALLERGIES: No Known Allergies  HOME MEDICATIONS: Outpatient Medications Prior to Visit  Medication Sig Dispense Refill  . atorvastatin (LIPITOR) 40 MG tablet Take 40 mg by mouth daily.    . benazepril (LOTENSIN) 10 MG tablet Take 10 mg by mouth daily.    Marland Kitchen glimepiride (AMARYL) 4 MG tablet TK 1 T PO  WITH BRE OR THE FIRST MAIN MEAL OF THE DAY ONCE D .  3  . JARDIANCE 25 MG TABS tablet   2  . latanoprost (XALATAN) 0.005 % ophthalmic solution Place 1 drop into both eyes at bedtime.   5  . memantine (NAMENDA) 10 MG tablet TAKE 1 TABLET BY MOUTH 2 TIMES DAILY. 180 tablet 3  . Multiple Vitamin (MULTIVITAMIN WITH MINERALS) TABS tablet Take 1 tablet by mouth daily.    . pantoprazole (PROTONIX) 40 MG tablet Take 40 mg by mouth daily.    Marland Kitchen PE-CPM-DM-GG-APAP PO Take by mouth as needed.     . Semaglutide (OZEMPIC Dickenson) Inject into the skin.    Marland Kitchen sertraline (ZOLOFT) 100 MG tablet Take 1 tablet (100 mg total) by mouth daily. 90 tablet 2  . tamsulosin (FLOMAX) 0.4 MG CAPS capsule   2  .  dipyridamole-aspirin (AGGRENOX) 200-25 MG 12hr capsule Take 1 capsule by mouth 2 (two) times daily. 60 capsule 3  . BYDUREON 2 MG PEN   1  . fluticasone (FLONASE) 50 MCG/ACT nasal spray   0   No facility-administered medications prior to visit.     PAST MEDICAL HISTORY: Past Medical History:  Diagnosis Date  . Diabetes mellitus   . Hyperlipidemia   . Hypertension   . Stroke [facility])     PAST SURGICAL HISTORY: Past Surgical History:  Procedure Laterality Date  . KNEE ARTHROSCOPY    .  NECK SURGERY      FAMILY HISTORY: History reviewed. No pertinent family history.  SOCIAL HISTORY: Social History   Socioeconomic History  . Marital status: Married    Spouse name: [name]  . Number of children: 3  . Years of education: College  . Highest education level: Not on file  Occupational History  . Occupation: Retired  Engineer, production  . Financial resource strain: Not on file  . Food insecurity:    Worry: Not on file    Inability: Not on file  . Transportation needs:    Medical: Not on file    Non-medical: Not on file  Tobacco Use  . Smoking status: Former Games developer  . Smokeless tobacco: Never Used  Substance and Sexual Activity  . Alcohol use: No  . Drug use: No  . Sexual activity: Not on file  Lifestyle  . Physical activity:    Days per week: Not on file    Minutes per session: Not on file  . Stress: Not on file  Relationships  . Social connections:    Talks on phone: Not on file    Gets together: Not on file    Attends religious service: Not on file    Active member of club or organization: Not on file    Attends meetings of clubs or organizations: Not on file    Relationship status: Not on file  . Intimate partner violence:    Fear of current or ex partner: Not on file    Emotionally abused: Not on file    Physically abused: Not on file    Forced sexual activity: Not on file  Other Topics Concern  . Not on file  Social History Narrative   Patient lives at home  with family.   Caffeine Use: 2 cups daily     PHYSICAL EXAM  Vitals:   02/23/18 1421  BP: 130/71  Pulse: 67  Weight: 195 lb (88.5 kg)  Height: 5\' 10"  (1.778 m)   Body mass index is 27.98 kg/m. General: well developed, well nourished Elderly Caucasian male, seated, in no evident distress Head: head normocephalic and atraumatic.   Neck: supple with no carotid  bruits Cardiovascular: regular rate and rhythm, no murmurs Musculoskeletal: no deformity Skin: no rash/petichiae. Band-Aid over left frontal region however abrasion from recent fall Vascular: Normal pulses all extremities    Neurological examination   Mentation: Alert oriented to time, place, history taking. MMSE not done today.  Last visit 23/30.  Diminished recall 2/3. AFT 7.    Attention span and concentration appropriate. .  Follows all commands speech and language fluent.  Cranial nerve II-XII: Fundoscopic exam not done. Diminished vision acuity on left. And can count fingers at 2 fet only. Left pupil slow  to react, right pupil  equal round reactive to light.  extraocular movements were full, visual field were full on confrontational test on the right only. Facial sensation and strength were normal. hearing was intact to finger rubbing bilaterally. Uvula tongue midline. head turning and shoulder shrug were normal and symmetric.Tongue protrusion into cheek strength was normal. Motor: normal bulk and tone, full strength in the BUE, BLE, fine finger movements normal, no pronator drift. No focal weakness. Very mild intermittent tremor of the left hand when he begins to walk. Mild cogwheel rigidity in both wrists upon activation only. No bradykinesia. Able to rise from a chair with arms folded across her chest. Positive glabellar tap. Diminished facial expression. No retropulsion with good posture  and balance. Sensory: normal and symmetric to light touch, pinprick, and  Vibration in  the upper and lower extremities Coordination:  finger-nose-finger, heel-to-shin bilaterally, no dysmetria Reflexes 1+ upper lower and symmetric  plantar responses were flexor bilaterally. Gait and Station: Rising up from seated position without assistance, normal stance,  moderate stride, good arm swing, smooth turning, able to perform tiptoe, and heel walking without difficulty. Tandem gait is steady  DIAGNOSTIC DATA (LABS, IMAGING, TESTING) - I reviewed patient records, labs, notes, testing and imaging myself where available.  Lab Results  Component Value Date   WBC 5.7 05/05/2016   HGB 11.9 (L) 05/05/2016   HCT 38.6 (L) 05/05/2016   MCV 88.3 05/05/2016   PLT 248 05/05/2016      Component Value Date/Time   NA 144 06/03/2016 1240   K 5.1 06/03/2016 1240   CL 102 06/03/2016 1240   CO2 26 06/03/2016 1240   GLUCOSE 135 (H) 06/03/2016 1240   GLUCOSE 240 (H) 05/05/2016 0003   BUN 18 06/03/2016 1240   CREATININE 1.10 06/03/2016 1240   CALCIUM 9.7 06/03/2016 1240   PROT 7.2 12/13/2015 1002   ALBUMIN 4.1 12/13/2015 1002   AST 33 12/13/2015 1002   ALT 30 12/13/2015 1002   ALKPHOS 75 12/13/2015 1002   BILITOT 1.2 12/13/2015 1002   GFRNONAA 66 06/03/2016 1240   GFRAA 77 06/03/2016 1240     Lab Results  Component Value Date   VITAMINB12 448 01/16/2016   Lab Results  Component Value Date   TSH 3.130 01/16/2016    ASSESSMENT AND PLAN  77 y.o. year old male  has a past medical history of Diabetes mellitus, Hyperlipidemia, Hypertension, and Stroke (HCC). here to follow-up. 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 But negative neurovascular workup. Mild memory and cognitive difficulties secondary to mild Alzheimer`s dementia.which appears stableMild left upper extremity resting and action tremor but not functionally disabling PLAN:    I had a long discussion with the patient and his wife regarding his mild dementia which appears to be stable. Continue Namenda 10 mg twice daily for dementia which  is more or less stable. Change aggrenox to aspirin 325 mg daily for stroke prevention with strict control of hypertension with blood pressure goal below 130/90, lipids with LDL cholesterol goal below 70 mg percent and diabetes with hemoglobin A1c goal below 6.5 . . Patient does have mild resting tremor which does not appear to be functionally disabling and he lacks other features of Parkinson's and hence we'll hold off on medications for that at the current time.Check f/u carotid dopplers. Return for follow-up in 1 year or call earlier if necessary  .Greater than 50% time during this 30 minute visit was spent on counseling and coordination of care about his cognitive impairment dementia tremors and remote stroke Delia Heady, MD [facility] [address] [place], [place] [address] [phone]

## 2018-02-23 NOTE — Patient Instructions (Signed)
I had a long discussion with the patient and his wife regarding his mild dementia which appears to be stable. Continue Namenda 10 mg twice daily for dementia which is more or less stable. Change aggrenox to aspirin 325 mg daily for stroke prevention with strict control of hypertension with blood pressure goal below 130/90, lipids with LDL cholesterol goal below 70 mg percent and diabetes with hemoglobin A1c goal below 6.5 . . Patient does have mild resting tremor which does not appear to be functionally disabling and he lacks other features of Parkinson's and hence we'll hold off on medications for that at the current time.Check f/u carotid dopplers. Return for follow-up in 1 year or call earlier if necessary

## 2018-02-26 DIAGNOSIS — J069 Acute upper respiratory infection, unspecified: Secondary | ICD-10-CM | POA: Diagnosis not present

## 2018-03-01 DIAGNOSIS — J069 Acute upper respiratory infection, unspecified: Secondary | ICD-10-CM | POA: Diagnosis not present

## 2018-03-02 DIAGNOSIS — J40 Bronchitis, not specified as acute or chronic: Secondary | ICD-10-CM | POA: Diagnosis not present

## 2018-03-03 ENCOUNTER — Encounter (HOSPITAL_COMMUNITY): Payer: Self-pay | Admitting: Emergency Medicine

## 2018-03-03 ENCOUNTER — Other Ambulatory Visit: Payer: Self-pay

## 2018-03-03 ENCOUNTER — Emergency Department (HOSPITAL_COMMUNITY): Payer: Medicare Other

## 2018-03-03 ENCOUNTER — Emergency Department (HOSPITAL_COMMUNITY)
Admission: EM | Admit: 2018-03-03 | Discharge: 2018-03-03 | Disposition: A | Discharge status: Home / Self Care | Payer: Medicare Other | Attending: Emergency Medicine | Admitting: Emergency Medicine

## 2018-03-03 DIAGNOSIS — N3000 Acute cystitis without hematuria: Secondary | ICD-10-CM | POA: Diagnosis not present

## 2018-03-03 DIAGNOSIS — E119 Type 2 diabetes mellitus without complications: Secondary | ICD-10-CM | POA: Diagnosis not present

## 2018-03-03 DIAGNOSIS — Z87891 Personal history of nicotine dependence: Secondary | ICD-10-CM | POA: Diagnosis not present

## 2018-03-03 DIAGNOSIS — R05 Cough: Secondary | ICD-10-CM | POA: Insufficient documentation

## 2018-03-03 DIAGNOSIS — I10 Essential (primary) hypertension: Secondary | ICD-10-CM | POA: Insufficient documentation

## 2018-03-03 DIAGNOSIS — R059 Cough, unspecified: Secondary | ICD-10-CM

## 2018-03-03 DIAGNOSIS — Z79899 Other long term (current) drug therapy: Secondary | ICD-10-CM | POA: Diagnosis not present

## 2018-03-03 LAB — URINALYSIS, ROUTINE W REFLEX MICROSCOPIC
BILIRUBIN URINE: NEGATIVE
HGB URINE DIPSTICK: NEGATIVE
Ketones, ur: 5 mg/dL — AB
NITRITE: NEGATIVE
PROTEIN: 30 mg/dL — AB
Specific Gravity, Urine: 1.032 — ABNORMAL HIGH (ref 1.005–1.030)
pH: 5 (ref 5.0–8.0)

## 2018-03-03 LAB — CBC WITH DIFFERENTIAL/PLATELET
Abs Immature Granulocytes: 0.1 10*3/uL (ref 0.0–0.1)
Basophils Absolute: 0 10*3/uL (ref 0.0–0.1)
Basophils Relative: 0 %
EOS ABS: 0 10*3/uL (ref 0.0–0.7)
Eosinophils Relative: 0 %
HCT: 44.2 % (ref 39.0–52.0)
HEMOGLOBIN: 13.6 g/dL (ref 13.0–17.0)
IMMATURE GRANULOCYTES: 1 %
LYMPHS ABS: 0.6 10*3/uL — AB (ref 0.7–4.0)
LYMPHS PCT: 6 %
MCH: 26.9 pg (ref 26.0–34.0)
MCHC: 30.8 g/dL (ref 30.0–36.0)
MCV: 87.4 fL (ref 78.0–100.0)
MONOS PCT: 10 %
Monocytes Absolute: 1.1 10*3/uL — ABNORMAL HIGH (ref 0.1–1.0)
NEUTROS ABS: 9.2 10*3/uL — AB (ref 1.7–7.7)
NEUTROS PCT: 83 %
Platelets: 198 10*3/uL (ref 150–400)
RBC: 5.06 MIL/uL (ref 4.22–5.81)
RDW: 16.2 % — ABNORMAL HIGH (ref 11.5–15.5)
WBC: 11 10*3/uL — ABNORMAL HIGH (ref 4.0–10.5)

## 2018-03-03 LAB — COMPREHENSIVE METABOLIC PANEL
ALT: 18 U/L (ref 17–63)
AST: 19 U/L (ref 15–41)
Albumin: 3.5 g/dL (ref 3.5–5.0)
Alkaline Phosphatase: 89 U/L (ref 38–126)
Anion gap: 10 (ref 5–15)
BUN: 25 mg/dL — ABNORMAL HIGH (ref 6–20)
CHLORIDE: 103 mmol/L (ref 101–111)
CO2: 27 mmol/L (ref 22–32)
CREATININE: 1.34 mg/dL — AB (ref 0.61–1.24)
Calcium: 8.6 mg/dL — ABNORMAL LOW (ref 8.9–10.3)
GFR calc non Af Amer: 50 mL/min — ABNORMAL LOW (ref >60)
GFR, EST AFRICAN AMERICAN: 58 mL/min — AB (ref >60)
Glucose, Bld: 210 mg/dL — ABNORMAL HIGH (ref 65–99)
POTASSIUM: 3.5 mmol/L (ref 3.5–5.1)
SODIUM: 140 mmol/L (ref 135–145)
Total Bilirubin: 1 mg/dL (ref 0.3–1.2)
Total Protein: 6.4 g/dL — ABNORMAL LOW (ref 6.5–8.1)

## 2018-03-03 LAB — LIPASE, BLOOD: Lipase: 27 U/L (ref 11–51)

## 2018-03-03 LAB — I-STAT CG4 LACTIC ACID, ED: LACTIC ACID, VENOUS: 1.2 mmol/L (ref 0.5–1.9)

## 2018-03-03 MED ORDER — PREDNISONE 20 MG PO TABS: 40.0000 mg | ORAL_TABLET | Freq: Every day | ORAL | 0 refills | Status: DC

## 2018-03-03 NOTE — Discharge Instructions (Signed)
Please read and follow all provided instructions.  Your diagnoses today include:  1. Cough   2. Acute cystitis without hematuria     Tests performed today include:  Blood counts and electrolytes -slightly high white blood cells  Chest x-ray -suspect non-fully inflated lungs, but possibility of pneumonia at the bases  Urine test- possible urine infection  Urine culture -pending  Vital signs. See below for your results today.   Medications prescribed:   Prednisone - steroid medicine   It is best to take this medication in the morning to prevent sleeping problems. If you are diabetic, monitor your blood sugar closely and stop taking Prednisone if blood sugar is over 300. Take with food to prevent stomach upset.   Take any prescribed medications only as directed.  Home care instructions:  Follow any educational materials contained in this packet.  Follow-up instructions: Please follow-up with your primary care provider in the next 2 days for further evaluation of your symptoms.   Return instructions:   Please return to the Emergency Department if you experience worsening symptoms.   Return with persistent fever, worsening shortness of breath, chest pain, new symptoms.  Please return if you have any other emergent concerns.  Additional Information:  Your vital signs today were: BP 135/61    Pulse 88    Temp 98.9 F (37.2 C)    Resp 20    Ht 5\' 10"  (1.778 m)    Wt 88.5 kg (195 lb)    SpO2 95%    BMI 27.98 kg/m  If your blood pressure (BP) was elevated above 135/85 this visit, please have this repeated by your doctor within one month. --------------

## 2018-03-03 NOTE — ED Notes (Signed)
Pt walked back to room with assistance from staff and family. Pt a little off balance

## 2018-03-03 NOTE — ED Triage Notes (Signed)
Pt presents to ED after being seen Monday for URI.  Started on abx and steroids.  Pt not improving, SpO2 noted to be 91% at PCP today.  Sent here.  Tylenol and Ibuprofen taken in the past few hours.  Denies pain.  Denies SOB.  Strong cough present.

## 2018-03-03 NOTE — ED Provider Notes (Signed)
[facility] Provider Note   CSN: [id number] Arrival date & time: 03/03/18  1614     History   Chief Complaint Chief Complaint  Patient presents with  . URI    HPI Daniel Morrow is a 77 y.o. male.  Patient with history of diabetes, Alzheimer's disease, stroke -- presents to the emergency department with cough and low oxygen saturations.  Patient began feeling unwell about a week ago and developed a cough.  Patient went to his primary care doctor first.  Two days ago he was seen and was started on Cefdinir and given a shot of IM steroids.  Chest x-ray at that time was negative.  Patient went back to the urgent care today due to persistent symptoms and cough and was found to have oxygen saturation of 91%.  He was asked to come to the emergency department for further evaluation.  Patient has had intermittent fevers however none at arrival.  He has been on Tylenol at home.  He is also been on Tessalon for cough.  He does not feel short of breath and has had no leg swelling.  No history of blood clots, CAD, atrial fibrillation, heart failure.  No history of COPD or asthma.  Cough has been nonproductive.  Patient's wife has also started having a cough that is nonproductive.  No nausea, vomiting, or diarrhea.  The onset of this condition was acute. The course is constant. Aggravating factors: none. Alleviating factors: none.       Past Medical History:  Diagnosis Date  . Diabetes mellitus   . Hyperlipidemia   . Hypertension   . Stroke [facility](HCC)     Patient Active Problem List   Diagnosis Date Noted  . Vision, loss, sudden 06/03/2016  . CRAO (central retinal artery occlusion) 06/03/2016  . Alzheimer's disease 01/16/2016  . Epididymo-orchitis 12/02/2013  . Orchitis and epididymitis 12/02/2013  . Mild cognitive impairment, so stated 02/17/2013  . CVA (cerebral infarction) 02/17/2013  . 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 Hypertension 04/03/2012  . Diabetes mellitus (HCC) 04/03/2012  . Hyperlipidemia     Past Surgical History:  Procedure Laterality Date  . KNEE ARTHROSCOPY    . NECK SURGERY          Home Medications    Prior to Admission medications   Medication Sig Start Date End Date Taking? Authorizing Provider  atorvastatin (LIPITOR) 40 MG tablet Take 40 mg by mouth at bedtime.    Yes [provider]  benazepril (LOTENSIN) 10 MG tablet Take 10 mg by mouth daily.   Yes [provider]  brimonidine (ALPHAGAN) 0.2 % ophthalmic solution Place 1 drop into the right eye 2 (two) times daily.  02/25/18  Yes [provider]  cefdinir (OMNICEF) 300 MG capsule Take 300 mg by mouth 2 (two) times daily. 03/01/18 03/08/18 Yes [provider]  dipyridamole-aspirin (AGGRENOX) 200-25 MG 12hr capsule Take 1 capsule by mouth 2 (two) times daily.   Yes [provider]  glimepiride (AMARYL) 4 MG tablet TK 1 T PO  WITH BRE OR THE FIRST MAIN MEAL OF THE DAY ONCE D . 06/12/16  Yes [provider]  JARDIANCE 25 MG TABS tablet Take 25 mg by mouth daily.  06/28/16  Yes [provider]  latanoprost (XALATAN) 0.005 % ophthalmic solution Place 1 drop into the right eye at bedtime.  06/02/16  Yes [provider]  memantine (NAMENDA) 10 MG tablet TAKE 1 TABLET BY MOUTH 2 TIMES DAILY. 07/16/17  Yes Micki Riley, MD  mirabegron ER (MYRBETRIQ) 25 MG TB24 tablet Take 25 mg by mouth at bedtime.   Yes [provider]  Multiple Vitamin (MULTIVITAMIN WITH MINERALS) TABS tablet Take 1 tablet by mouth daily.   Yes [provider]  pantoprazole (PROTONIX) 40 MG tablet Take 40 mg by mouth daily.   Yes [provider]  prednisoLONE acetate (PRED FORTE) 1 % ophthalmic suspension Place 1 drop into the left eye daily.   Yes [provider]  Semaglutide (OZEMPIC [place]) Inject 1 mg into the skin once a week.    Yes [provider]  sertraline (ZOLOFT) 100 MG tablet Take 1 tablet (100 mg total) by mouth daily. 03/02/17  Yes Micki Riley, MD  tamsulosin (FLOMAX) 0.4 MG CAPS capsule Take 0.4 mg by mouth at bedtime.  12/26/16  Yes [provider]  timolol (TIMOPTIC) 0.5 % ophthalmic solution Place 1 drop into the right eye 2 (two) times daily.   Yes [provider]  aspirin EC 325 MG tablet Take 1 tablet (325 mg total) by mouth daily. 02/23/18   Micki Riley, MD  predniSONE (DELTASONE) 20 MG tablet Take 2 tablets (40 mg total) by mouth daily. 03/03/18   Renne Crigler, PA-C    Family History History reviewed. No pertinent family history.  Social History Social History   Tobacco Use  . Smoking status: Former Games developer  . Smokeless tobacco: Never Used  Substance Use Topics  . Alcohol use: No  . Drug use: No     Allergies   Patient has no known allergies.   Review of Systems Review of Systems  Constitutional: Positive for fever.  HENT: Positive for congestion. Negative for rhinorrhea and sore throat.   Eyes: Negative for redness.  Respiratory: Positive for cough. Negative for shortness of breath.   Cardiovascular: Negative for chest pain.  Gastrointestinal: Negative for abdominal pain, diarrhea, nausea and vomiting.  Genitourinary: Positive for frequency. Negative for dysuria.  Musculoskeletal: Negative for myalgias.  Skin: Negative for rash.  Neurological: Negative for headaches.     Physical Exam Updated Vital Signs BP 135/61   Pulse 88   Temp 98.9 F (37.2 C)   Resp 20   Ht 5\' 10"  (1.778 m)   Wt 88.5 kg (195 lb)   SpO2 95%   BMI 27.98 kg/m   Physical Exam  Constitutional: He appears well-developed and well-nourished.  HENT:  Head: Normocephalic and atraumatic.  Oropharynx clear, mucous membranes slightly dry.  Eyes: Conjunctivae are normal. Right eye exhibits no discharge.  Left eye exhibits no discharge.  Neck: Normal range of motion. Neck supple.  Cardiovascular: Normal rate and regular rhythm.  Murmur (2 out of 6 systolic murmur heard over the upper left sternal border) heard. Pulmonary/Chest: Effort normal and breath sounds normal. No stridor. No respiratory distress. He has no wheezes. He has no rales.  Abdominal: Soft. There is no tenderness. There is no guarding.  Musculoskeletal: He exhibits no edema or tenderness.  No clinical signs of DVT.  Neurological: He is alert.  Skin: Skin is warm and dry.  Psychiatric: He has a normal mood and affect.  Nursing note and vitals reviewed.    ED Treatments / Results  Labs (all labs ordered are listed, but only abnormal results are displayed) Labs Reviewed  COMPREHENSIVE METABOLIC PANEL - Abnormal; Notable  for the following components:      Result Value   Glucose, Bld 210 (*)    BUN 25 (*)    Creatinine, Ser 1.34 (*)    Calcium 8.6 (*)    Total Protein 6.4 (*)    GFR calc non Af Amer 50 (*)    GFR calc Af Amer 58 (*)    All other components within normal limits  CBC WITH DIFFERENTIAL/PLATELET - Abnormal; Notable for the following components:   WBC 11.0 (*)    RDW 16.2 (*)    Neutro Abs 9.2 (*)    Lymphs Abs 0.6 (*)    Monocytes Absolute 1.1 (*)    All other components within normal limits  URINALYSIS, ROUTINE W REFLEX MICROSCOPIC - Abnormal; Notable for the following components:   APPearance HAZY (*)    Specific Gravity, Urine 1.032 (*)    Glucose, UA >=500 (*)    Ketones, ur 5 (*)    Protein, ur 30 (*)    Leukocytes, UA TRACE (*)    Bacteria, UA RARE (*)    All other components within normal limits  URINE CULTURE  LIPASE, BLOOD  I-STAT CG4 LACTIC ACID, ED  I-STAT CG4 LACTIC ACID, ED    EKG None  Radiology Dg Chest 2 View  Result Date: 03/03/2018 CLINICAL DATA:  Cough. EXAM: CHEST - 2 VIEW COMPARISON:  Chest x-ray dated December 21, 2017. FINDINGS: The heart size and mediastinal contours  are within normal limits. Normal pulmonary vascularity. New streaky linear opacities at both lung bases. No focal consolidation, pleural effusion, or pneumothorax. Unchanged elevation of the right hemidiaphragm. No acute osseous abnormality. IMPRESSION: New streaky bibasilar linear opacities, favored to reflect atelectasis, less likely multifocal pneumonia. Electronically Signed   By: Obie Dredge M.D.   On: 03/03/2018 17:20    Procedures Procedures (including critical care time)  Medications Ordered in ED Medications - No data to display   Initial Impression / Assessment and Plan / ED Course  I have reviewed the triage vital signs and the nursing notes.  Pertinent labs & imaging results that were available during my care of the patient were reviewed by me and considered in my medical decision making (see chart for details).     Patient seen and examined. Work-up reviewed.  Patient looks well.  Oxygen saturation 93 to 95% on room air.  Will ambulate with pulse ox.  Discussed with Dr. Rodena Medin.  Vital signs reviewed and are as follows: BP 135/61   Pulse 88   Temp 98.9 F (37.2 C)   Resp 20   Ht 5\' 10"  (1.778 m)   Wt 88.5 kg (195 lb)   SpO2 95%   BMI 27.98 kg/m   Patient ambulated well and maintained his oxygen saturations without shortness of breath.  Discussed results with patient and wife.  They are comfortable with discharge home at this time.  I encouraged PCP follow-up in 2 days for recheck.  They verbalized understanding.  UA does have signs of white blood cells.  Given urinary frequency, this may represent a UTI.  Culture is pending.  Patient is already on Cefdinir.  Discussed role of urine culture.  Encouraged to return to the emergency department with persistent fever, worsening shortness of breath, chest pain, new symptoms or other concerns.  Patient and wife verbalized understanding and agree with plan.  Final Clinical Impressions(s) / ED Diagnoses   Final  diagnoses:  Cough  Acute cystitis without hematuria   Patient with  cough over the past several days, nonproductive, occasional fevers presents after mild hypoxia at PCP.  Patient has not been hypoxic here white blood cell count is only minimally elevated.  Chest x-ray shows atelectasis with lower suspicion for pneumonia.  Patient is on cephalosporin.  This should treat both UTI and pneumonia.  Patient given 4 days additional prednisone.  Patient has ambulated here and appears well.  Feel comfortable discharged home with close PCP follow-up, return with worsening.  Patient and wife agree.  I do not suspect ACS or PE in this patient.   ED Discharge Orders        Ordered    predniSONE (DELTASONE) 20 MG tablet  Daily     03/03/18 2219       Renne Crigler, PA-C 03/03/18 2230    Wynetta Fines, MD 03/03/18 2325

## 2018-03-03 NOTE — ED Provider Notes (Signed)
Patient placed in Quick Look pathway, seen and evaluated   Chief Complaint: Cough  HPI:   77 y.o. who presents for evaluation of cough, congestion, low O2 sats.  Wife reports that over the last 5 days, patient has had some nasal congestion, cough.  She has not noticed that it has been productive.  She states that he had been seen by his primary care doctor last week for evaluation of symptoms and could not find any abnormalities.  She reports that over the weekend, he continued to get worse and started having fever up to 101.  She states that she took patient to urgent care 2 days ago where chest x-ray revealed no pneumonia.  Patient was started on Omnicef for presumed viral URI.  Wife reports that since Monday, he has been taking the [place].  She reports that today, the fever had persisted and patient had continued to have cough so she took him back to the urgent care.  She reports that while at urgent care, his O2 sats were down to 91% on room air.  Additionally, they were concerned that patient was still having fever and was prompted to go to the emergency department for further evaluation.  Prior to coming to the ED, he got steroids.  Wife denies any nausea/vomiting, decreased appetite.  Patient had been complaining of some upper abdominal pain prior to coming to the ED today.  Patient has a history of dementia and therefore most the history is obtained by patient's wife.  Patient had Tylenol approximately 1:30 PM.  Additionally, he had ibuprofen at the urgent care prior to coming to the ED.  ROS: Cough   Physical Exam:   Gen: No distress  Neuro: Awake and Alert  Skin: Warm    Focused Exam: Lungs clear to auscultation bilaterally.  No evidence of respiratory distress.  Abdomen is soft, nondistended, nontender.   Initiation of care has begun. The patient has been counseled on the process, plan, and necessity for staying for the completion/evaluation, and the remainder of the medical screening  examination    Rosana HoesLayden, Lindsey A, PA-C 03/03/18 1649    Lorre NickAllen, Anthony, MD 03/03/18 2301

## 2018-03-03 NOTE — ED Notes (Addendum)
Pt. Walked about 60 ft, maintained 94-95% O2 the entire trip. Had no additional SOB, no dizziness, no trouble ambulating.

## 2018-03-03 NOTE — ED Notes (Signed)
Pt verbalized understanding of d/c instructions and has no further questions, VSS, NAD.  

## 2018-03-04 ENCOUNTER — Encounter (HOSPITAL_COMMUNITY): Payer: Medicare Other

## 2018-03-04 ENCOUNTER — Ambulatory Visit (HOSPITAL_COMMUNITY): Payer: Medicare Other

## 2018-03-04 LAB — URINE CULTURE: Culture: NO GROWTH

## 2018-03-09 DIAGNOSIS — J189 Pneumonia, unspecified organism: Secondary | ICD-10-CM | POA: Diagnosis not present

## 2018-03-09 DIAGNOSIS — N3941 Urge incontinence: Secondary | ICD-10-CM | POA: Diagnosis not present

## 2018-03-11 ENCOUNTER — Ambulatory Visit (HOSPITAL_COMMUNITY): Payer: Medicare Other

## 2018-03-23 DIAGNOSIS — Z85828 Personal history of other malignant neoplasm of skin: Secondary | ICD-10-CM | POA: Diagnosis not present

## 2018-03-23 DIAGNOSIS — L570 Actinic keratosis: Secondary | ICD-10-CM | POA: Diagnosis not present

## 2018-05-25 DIAGNOSIS — M25531 Pain in right wrist: Secondary | ICD-10-CM | POA: Diagnosis not present

## 2018-05-26 DIAGNOSIS — M79641 Pain in right hand: Secondary | ICD-10-CM | POA: Diagnosis not present

## 2018-05-30 DIAGNOSIS — M25531 Pain in right wrist: Secondary | ICD-10-CM | POA: Diagnosis not present

## 2018-05-31 DIAGNOSIS — E1165 Type 2 diabetes mellitus with hyperglycemia: Secondary | ICD-10-CM | POA: Diagnosis not present

## 2018-05-31 DIAGNOSIS — N182 Chronic kidney disease, stage 2 (mild): Secondary | ICD-10-CM | POA: Diagnosis not present

## 2018-05-31 DIAGNOSIS — E1142 Type 2 diabetes mellitus with diabetic polyneuropathy: Secondary | ICD-10-CM | POA: Diagnosis not present

## 2018-05-31 DIAGNOSIS — E1122 Type 2 diabetes mellitus with diabetic chronic kidney disease: Secondary | ICD-10-CM | POA: Diagnosis not present

## 2018-06-02 DIAGNOSIS — M25531 Pain in right wrist: Secondary | ICD-10-CM | POA: Diagnosis not present

## 2018-06-14 ENCOUNTER — Ambulatory Visit (HOSPITAL_COMMUNITY)
Admission: RE | Admit: 2018-06-14 | Discharge: 2018-06-14 | Disposition: A | Discharge status: Home / Self Care | Payer: Medicare Other | Source: Ambulatory Visit | Attending: Internal Medicine | Admitting: Internal Medicine

## 2018-06-14 DIAGNOSIS — E785 Hyperlipidemia, unspecified: Secondary | ICD-10-CM | POA: Insufficient documentation

## 2018-06-14 DIAGNOSIS — I6529 Occlusion and stenosis of unspecified carotid artery: Secondary | ICD-10-CM | POA: Insufficient documentation

## 2018-06-14 DIAGNOSIS — Z8673 Personal history of transient ischemic attack (TIA), and cerebral infarction without residual deficits: Secondary | ICD-10-CM | POA: Diagnosis not present

## 2018-06-14 DIAGNOSIS — I10 Essential (primary) hypertension: Secondary | ICD-10-CM | POA: Insufficient documentation

## 2018-06-14 DIAGNOSIS — E119 Type 2 diabetes mellitus without complications: Secondary | ICD-10-CM | POA: Diagnosis not present

## 2018-06-14 NOTE — Progress Notes (Signed)
*  PRELIMINARY RESULTS* Vascular Ultrasound Carotid Duplex (Doppler) has been completed.  Findings suggest 1-39% internal carotid artery stenosis bilaterally. Vertebral arteries are patent with antegrade flow.  06/14/2018 9:46 AM Gertie FeyMichelle Shakedra Beam, MHA, RVT, RDCS, RDMS

## 2018-06-15 ENCOUNTER — Telehealth: Payer: Self-pay | Admitting: *Deleted

## 2018-06-15 NOTE — Telephone Encounter (Signed)
-----   Message from Micki RileyPramod S Sethi, MD sent at 06/15/2018 11:08 AM EDT ----- Joneen RoachKindly inform the patient that carotid ultrasound study showed no significant extracranial stenosis

## 2018-06-15 NOTE — Telephone Encounter (Signed)
Called and spoke with wife about results per Dr. Pearlean BrownieSethi note. She verbalized understanding.

## 2018-06-23 DIAGNOSIS — M1712 Unilateral primary osteoarthritis, left knee: Secondary | ICD-10-CM | POA: Diagnosis not present

## 2018-06-23 DIAGNOSIS — M1711 Unilateral primary osteoarthritis, right knee: Secondary | ICD-10-CM | POA: Diagnosis not present

## 2018-07-13 DIAGNOSIS — E1165 Type 2 diabetes mellitus with hyperglycemia: Secondary | ICD-10-CM | POA: Diagnosis not present

## 2018-07-16 DIAGNOSIS — I10 Essential (primary) hypertension: Secondary | ICD-10-CM | POA: Diagnosis not present

## 2018-07-16 DIAGNOSIS — F418 Other specified anxiety disorders: Secondary | ICD-10-CM | POA: Diagnosis not present

## 2018-07-16 DIAGNOSIS — E1122 Type 2 diabetes mellitus with diabetic chronic kidney disease: Secondary | ICD-10-CM | POA: Diagnosis not present

## 2018-07-16 DIAGNOSIS — K219 Gastro-esophageal reflux disease without esophagitis: Secondary | ICD-10-CM | POA: Diagnosis not present

## 2018-07-16 DIAGNOSIS — Z0000 Encounter for general adult medical examination without abnormal findings: Secondary | ICD-10-CM | POA: Diagnosis not present

## 2018-07-16 DIAGNOSIS — Z1389 Encounter for screening for other disorder: Secondary | ICD-10-CM | POA: Diagnosis not present

## 2018-07-16 DIAGNOSIS — F0390 Unspecified dementia without behavioral disturbance: Secondary | ICD-10-CM | POA: Diagnosis not present

## 2018-07-19 DIAGNOSIS — H402211 Chronic angle-closure glaucoma, right eye, mild stage: Secondary | ICD-10-CM | POA: Diagnosis not present

## 2018-07-19 DIAGNOSIS — H402223 Chronic angle-closure glaucoma, left eye, severe stage: Secondary | ICD-10-CM | POA: Diagnosis not present

## 2018-07-21 DIAGNOSIS — I10 Essential (primary) hypertension: Secondary | ICD-10-CM | POA: Diagnosis not present

## 2018-07-21 DIAGNOSIS — E1165 Type 2 diabetes mellitus with hyperglycemia: Secondary | ICD-10-CM | POA: Diagnosis not present

## 2018-07-21 DIAGNOSIS — E785 Hyperlipidemia, unspecified: Secondary | ICD-10-CM | POA: Diagnosis not present

## 2018-07-21 DIAGNOSIS — F0390 Unspecified dementia without behavioral disturbance: Secondary | ICD-10-CM | POA: Diagnosis not present

## 2018-08-03 ENCOUNTER — Emergency Department (HOSPITAL_COMMUNITY)
Admission: EM | Admit: 2018-08-03 | Discharge: 2018-08-03 | Disposition: A | Discharge status: Home / Self Care | Payer: Medicare Other | Attending: Emergency Medicine | Admitting: Emergency Medicine

## 2018-08-03 ENCOUNTER — Emergency Department (HOSPITAL_COMMUNITY): Payer: Medicare Other

## 2018-08-03 ENCOUNTER — Encounter (HOSPITAL_COMMUNITY): Payer: Self-pay | Admitting: Internal Medicine

## 2018-08-03 DIAGNOSIS — S0990XA Unspecified injury of head, initial encounter: Secondary | ICD-10-CM | POA: Diagnosis not present

## 2018-08-03 DIAGNOSIS — I10 Essential (primary) hypertension: Secondary | ICD-10-CM | POA: Insufficient documentation

## 2018-08-03 DIAGNOSIS — Z87891 Personal history of nicotine dependence: Secondary | ICD-10-CM | POA: Insufficient documentation

## 2018-08-03 DIAGNOSIS — Y92000 Kitchen of unspecified non-institutional (private) residence as  the place of occurrence of the external cause: Secondary | ICD-10-CM | POA: Insufficient documentation

## 2018-08-03 DIAGNOSIS — Y939 Activity, unspecified: Secondary | ICD-10-CM | POA: Insufficient documentation

## 2018-08-03 DIAGNOSIS — S0083XA Contusion of other part of head, initial encounter: Secondary | ICD-10-CM | POA: Insufficient documentation

## 2018-08-03 DIAGNOSIS — W19XXXA Unspecified fall, initial encounter: Secondary | ICD-10-CM | POA: Diagnosis not present

## 2018-08-03 DIAGNOSIS — Z7982 Long term (current) use of aspirin: Secondary | ICD-10-CM | POA: Insufficient documentation

## 2018-08-03 DIAGNOSIS — Y999 Unspecified external cause status: Secondary | ICD-10-CM | POA: Diagnosis not present

## 2018-08-03 DIAGNOSIS — E119 Type 2 diabetes mellitus without complications: Secondary | ICD-10-CM | POA: Diagnosis not present

## 2018-08-03 DIAGNOSIS — S0093XA Contusion of unspecified part of head, initial encounter: Secondary | ICD-10-CM

## 2018-08-03 DIAGNOSIS — W010XXA Fall on same level from slipping, tripping and stumbling without subsequent striking against object, initial encounter: Secondary | ICD-10-CM

## 2018-08-03 LAB — CBC
HCT: 41.9 % (ref 39.0–52.0)
HEMOGLOBIN: 12.8 g/dL — AB (ref 13.0–17.0)
MCH: 27.8 pg (ref 26.0–34.0)
MCHC: 30.5 g/dL (ref 30.0–36.0)
MCV: 91.1 fL (ref 80.0–100.0)
Platelets: 219 10*3/uL (ref 150–400)
RBC: 4.6 MIL/uL (ref 4.22–5.81)
RDW: 15.1 % (ref 11.5–15.5)
WBC: 6.1 10*3/uL (ref 4.0–10.5)
nRBC: 0 % (ref 0.0–0.2)

## 2018-08-03 LAB — BASIC METABOLIC PANEL
Anion gap: 6 (ref 5–15)
BUN: 17 mg/dL (ref 8–23)
CALCIUM: 8.8 mg/dL — AB (ref 8.9–10.3)
CO2: 29 mmol/L (ref 22–32)
CREATININE: 1.25 mg/dL — AB (ref 0.61–1.24)
Chloride: 103 mmol/L (ref 98–111)
GFR calc non Af Amer: 55 mL/min — ABNORMAL LOW (ref >60)
Glucose, Bld: 248 mg/dL — ABNORMAL HIGH (ref 70–99)
Potassium: 3.7 mmol/L (ref 3.5–5.1)
Sodium: 138 mmol/L (ref 135–145)

## 2018-08-03 NOTE — Discharge Instructions (Addendum)
It was our pleasure to provide your ER care today - we hope that you feel better.  Fall precautions.  Rest, drink adequate fluids.  Follow up with primary care doctor.   Return to ER if worse, new symptoms, fevers, new or severe pain, trouble breathing, feeling weak/fainting, other concern.

## 2018-08-03 NOTE — ED Notes (Signed)
Patient verbalizes understanding of discharge instructions. Opportunity for questioning and answers were provided. Armband removed by staff, pt discharged from ED.  

## 2018-08-03 NOTE — ED Notes (Signed)
Patient ambulated to the restroom without assistance. Denies dizziness and lightheadedness. MD Denton LankSteinl made aware.

## 2018-08-03 NOTE — ED Provider Notes (Signed)
[facility]   CSN: [id number] Arrival date & time: 08/03/18  1103     History   Chief Complaint Chief Complaint  Patient presents with  . Fall    HPI Daniel Morrow is a 77 y.o. male.  Patient s/p fall at home yesterday. States was in kitchen, unsure what caused fall.  No loc. States hit head and takes aggrenox. Was able to get up under own power. Denies headache. No neck or back pain. Denies extremity pain or injury. Denies faintness or dizziness prior to fall. Has been ambulatory today, and drove self to ED. Pt requests head CT.   The history is provided by the patient.  Fall  Pertinent negatives include no chest pain, no abdominal pain, no headaches and no shortness of breath.    Past Medical History:  Diagnosis Date  . Diabetes mellitus   . Hyperlipidemia   . Hypertension   . Stroke [facility](HCC)     Patient Active Problem List   Diagnosis Date Noted  . Vision, loss, sudden 06/03/2016  . CRAO (central retinal artery occlusion) 06/03/2016  . Alzheimer's disease (HCC) 01/16/2016  . Epididymo-orchitis 12/02/2013  . Orchitis and epididymitis 12/02/2013  . Mild cognitive impairment, so stated 02/17/2013  . CVA (cerebral infarction) 02/17/2013  . 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 Hypertension 04/03/2012  . Diabetes mellitus (HCC) 04/03/2012  . Hyperlipidemia     Past Surgical History:  Procedure Laterality Date  . KNEE ARTHROSCOPY    . NECK SURGERY          Home Medications    Prior to Admission medications   Medication Sig Start Date End Date Taking? Authorizing Provider  aspirin EC 325 MG tablet Take 1 tablet (325 mg total) by mouth daily. 02/23/18   Micki RileySethi, Pramod S, MD  atorvastatin (LIPITOR) 40 MG tablet Take 40 mg by mouth at bedtime.     [provider]    benazepril (LOTENSIN) 10 MG tablet Take 10 mg by mouth daily.    [provider]  brimonidine (ALPHAGAN) 0.2 % ophthalmic solution Place 1 drop into the right eye 2 (two) times daily.  02/25/18   [provider]  dipyridamole-aspirin (AGGRENOX) 200-25 MG 12hr capsule Take 1 capsule by mouth 2 (two) times daily.    [provider]  glimepiride (AMARYL) 4 MG tablet TK 1 T PO  WITH BRE OR THE FIRST MAIN MEAL OF THE DAY ONCE D . 06/12/16   [provider]  JARDIANCE 25 MG TABS tablet Take 25 mg by mouth daily.  06/28/16   [provider]  latanoprost (XALATAN) 0.005 % ophthalmic solution Place 1 drop into the right eye at bedtime.  06/02/16   [provider]  memantine (NAMENDA) 10 MG tablet TAKE 1 TABLET BY MOUTH 2 TIMES DAILY. 07/16/17   Micki RileySethi, Pramod S, MD  mirabegron ER (MYRBETRIQ) 25 MG TB24 tablet Take 25 mg by mouth at bedtime.    [provider]  Multiple Vitamin (MULTIVITAMIN WITH MINERALS) TABS tablet Take 1 tablet by mouth daily.    [provider]  pantoprazole (PROTONIX) 40 MG tablet Take 40 mg by mouth daily.    [provider]  prednisoLONE acetate (PRED FORTE) 1 % ophthalmic suspension Place 1 drop into the left eye daily.    [provider]  predniSONE (DELTASONE) 20 MG  tablet Take 2 tablets (40 mg total) by mouth daily. 03/03/18   Renne Crigler, PA-C  Semaglutide (OZEMPIC [place]) Inject 1 mg into the skin once a week.     [provider]  sertraline (ZOLOFT) 100 MG tablet Take 1 tablet (100 mg total) by mouth daily. 03/02/17   Micki Riley, MD  tamsulosin (FLOMAX) 0.4 MG CAPS capsule Take 0.4 mg by mouth at bedtime.  12/26/16   [provider]  timolol (TIMOPTIC) 0.5 % ophthalmic solution Place 1 drop into the right eye 2 (two) times daily.    [provider]    Family History No family history on file.  Social History Social History   Tobacco Use  . Smoking status:  Former Games developer  . Smokeless tobacco: Never Used  Substance Use Topics  . Alcohol use: No  . Drug use: No     Allergies   Patient has no known allergies.   Review of Systems Review of Systems  Constitutional: Negative for fever.  HENT: Negative for sore throat.   Eyes: Negative for visual disturbance.  Respiratory: Negative for cough and shortness of breath.   Cardiovascular: Negative for chest pain.  Gastrointestinal: Negative for abdominal pain, blood in stool, diarrhea and vomiting.  Genitourinary: Negative for dysuria and flank pain.  Musculoskeletal: Negative for back pain and neck pain.  Skin: Negative for wound.  Neurological: Negative for dizziness, weakness, numbness and headaches.  Hematological: Does not bruise/bleed easily.  Psychiatric/Behavioral: Negative for confusion.     Physical Exam Updated Vital Signs Pulse 84   Temp 98.2 F (36.8 C) (Oral)   Resp 16   Ht 1.803 m (5\' 11" )   Wt 86.2 kg   SpO2 95%   BMI 26.50 kg/m   Physical Exam  Constitutional: He appears well-developed and well-nourished.  HENT:  Nose: Nose normal.  Mouth/Throat: Oropharynx is clear and moist.  Contusion forehead.   Eyes: Pupils are equal, round, and reactive to light. Conjunctivae and EOM are normal.  Neck: Normal range of motion. Neck supple. No tracheal deviation present.  No bruits.   Cardiovascular: Normal rate, regular rhythm, normal heart sounds and intact distal pulses. Exam reveals no gallop and no friction rub.  No murmur heard. Pulmonary/Chest: Effort normal and breath sounds normal. No accessory muscle usage. No respiratory distress. He exhibits no tenderness.  Abdominal: Soft. Bowel sounds are normal. He exhibits no distension. There is no tenderness.  Musculoskeletal: He exhibits no edema.  CTLS spine, non tender, aligned, no step off. Good rom bil extremities without pain or focal bony tenderness.   Neurological: He is alert.  Speech clear/fluent. Motor/sens  grossly intact. Steady gait.   Skin: Skin is warm and dry.  Psychiatric: He has a normal mood and affect.  Nursing note and vitals reviewed.    ED Treatments / Results  Labs (all labs ordered are listed, but only abnormal results are displayed) Results for orders placed or performed during the hospital encounter of 08/03/18  CBC  Result Value Ref Range   WBC 6.1 4.0 - 10.5 K/uL   RBC 4.60 4.22 - 5.81 MIL/uL   Hemoglobin 12.8 (L) 13.0 - 17.0 g/dL   HCT 09.8 11.9 - 14.7 %   MCV 91.1 80.0 - 100.0 fL   MCH 27.8 26.0 - 34.0 pg   MCHC 30.5 30.0 - 36.0 g/dL   RDW 82.9 56.2 - 13.0 %   Platelets 219 150 - 400 K/uL   nRBC 0.0 0.0 - 0.2 %  Basic metabolic panel  Result Value Ref Range   Sodium 138 135 - 145 mmol/L   Potassium 3.7 3.5 - 5.1 mmol/L   Chloride 103 98 - 111 mmol/L   CO2 29 22 - 32 mmol/L   Glucose, Bld 248 (H) 70 - 99 mg/dL   BUN 17 8 - 23 mg/dL   Creatinine, Ser 9.52 (H) 0.61 - 1.24 mg/dL   Calcium 8.8 (L) 8.9 - 10.3 mg/dL   GFR calc non Af Amer 55 (L) >60 mL/min   GFR calc Af Amer >60 >60 mL/min   Anion gap 6 5 - 15   Ct Head Wo Contrast  Result Date: 08/03/2018 CLINICAL DATA:  Fall yesterday with head injury, initial encounter EXAM: CT HEAD WITHOUT CONTRAST TECHNIQUE: Contiguous axial images were obtained from the base of the skull through the vertex without intravenous contrast. COMPARISON:  06/27/2017 FINDINGS: Brain: Mild atrophic changes and chronic white matter ischemic changes are noted stable from the previous exam. No findings to suggest acute hemorrhage, acute infarction or space-occupying mass lesion are noted. Vascular: No hyperdense vessel or unexpected calcification. Skull: Normal. Negative for fracture or focal lesion. Sinuses/Orbits: No acute finding. Other: None. IMPRESSION: Mild atrophic and chronic white matter ischemic changes. No acute intracranial abnormality is noted. Electronically Signed   By: Alcide Clever M.D.   On: 08/03/2018 13:05    EKG EKG  Interpretation  Date/Time:  Tuesday August 03 2018 11:29:23 EST Ventricular Rate:  75 PR Interval:    QRS Duration: 92 QT Interval:  410 QTC Calculation: 458 R Axis:   81 Text Interpretation:  Sinus rhythm Baseline wander in lead(s) I II III aVR aVL No significant change since last tracing Confirmed by Cathren Laine ([id number]) on 08/03/2018 1:14:49 PM   Radiology Ct Head Wo Contrast  Result Date: 08/03/2018 CLINICAL DATA:  Fall yesterday with head injury, initial encounter EXAM: CT HEAD WITHOUT CONTRAST TECHNIQUE: Contiguous axial images were obtained from the base of the skull through the vertex without intravenous contrast. COMPARISON:  06/27/2017 FINDINGS: Brain: Mild atrophic changes and chronic white matter ischemic changes are noted stable from the previous exam. No findings to suggest acute hemorrhage, acute infarction or space-occupying mass lesion are noted. Vascular: No hyperdense vessel or unexpected calcification. Skull: Normal. Negative for fracture or focal lesion. Sinuses/Orbits: No acute finding. Other: None. IMPRESSION: Mild atrophic and chronic white matter ischemic changes. No acute intracranial abnormality is noted. Electronically Signed   By: Alcide Clever M.D.   On: 08/03/2018 13:05    Procedures Procedures (including critical care time)  Medications Ordered in ED Medications - No data to display   Initial Impression / Assessment and Plan / ED Course  I have reviewed the triage vital signs and the nursing notes.  Pertinent labs & imaging results that were available during my care of the patient were reviewed by me and considered in my medical decision making (see chart for details).  Pt requests imaging.   Reviewed nursing notes and prior charts for additional history.   Labs and CT ordered.  Labs reviewed - chemistries normal.  Ct reviewed - no acute process.  Pt ambulatory about ED - steady gait, no faintness or dizziness.   Po fluids.  Pt currently  appears stable for d/c.     Final Clinical Impressions(s) / ED Diagnoses   Final diagnoses:  None    ED Discharge Orders    None       Cathren Laine, MD 08/03/18 1335

## 2018-08-03 NOTE — ED Triage Notes (Signed)
Patient here after falling in the kitchen yesterday. Pt hit his head during fall and was able to get himself up from the floor to walk. Denies pain, numbness and weakness. Denies LOC. On blood thinners.

## 2018-08-11 DIAGNOSIS — F418 Other specified anxiety disorders: Secondary | ICD-10-CM | POA: Diagnosis not present

## 2018-08-11 DIAGNOSIS — G309 Alzheimer's disease, unspecified: Secondary | ICD-10-CM | POA: Diagnosis not present

## 2018-08-11 DIAGNOSIS — R296 Repeated falls: Secondary | ICD-10-CM | POA: Diagnosis not present

## 2018-08-11 DIAGNOSIS — F0280 Dementia in other diseases classified elsewhere without behavioral disturbance: Secondary | ICD-10-CM | POA: Diagnosis not present

## 2018-08-11 DIAGNOSIS — F3341 Major depressive disorder, recurrent, in partial remission: Secondary | ICD-10-CM | POA: Diagnosis not present

## 2018-08-11 DIAGNOSIS — E785 Hyperlipidemia, unspecified: Secondary | ICD-10-CM | POA: Diagnosis not present

## 2018-08-11 DIAGNOSIS — E1142 Type 2 diabetes mellitus with diabetic polyneuropathy: Secondary | ICD-10-CM | POA: Diagnosis not present

## 2018-08-11 DIAGNOSIS — I129 Hypertensive chronic kidney disease with stage 1 through stage 4 chronic kidney disease, or unspecified chronic kidney disease: Secondary | ICD-10-CM | POA: Diagnosis not present

## 2018-08-11 DIAGNOSIS — E1122 Type 2 diabetes mellitus with diabetic chronic kidney disease: Secondary | ICD-10-CM | POA: Diagnosis not present

## 2018-08-11 DIAGNOSIS — E1165 Type 2 diabetes mellitus with hyperglycemia: Secondary | ICD-10-CM | POA: Diagnosis not present

## 2018-08-11 DIAGNOSIS — N182 Chronic kidney disease, stage 2 (mild): Secondary | ICD-10-CM | POA: Diagnosis not present

## 2018-08-11 DIAGNOSIS — I69398 Other sequelae of cerebral infarction: Secondary | ICD-10-CM | POA: Diagnosis not present

## 2018-08-11 DIAGNOSIS — N39498 Other specified urinary incontinence: Secondary | ICD-10-CM | POA: Diagnosis not present

## 2018-08-11 DIAGNOSIS — N401 Enlarged prostate with lower urinary tract symptoms: Secondary | ICD-10-CM | POA: Diagnosis not present

## 2018-08-11 DIAGNOSIS — I6501 Occlusion and stenosis of right vertebral artery: Secondary | ICD-10-CM | POA: Diagnosis not present

## 2018-08-11 DIAGNOSIS — H409 Unspecified glaucoma: Secondary | ICD-10-CM | POA: Diagnosis not present

## 2018-08-13 DIAGNOSIS — H402211 Chronic angle-closure glaucoma, right eye, mild stage: Secondary | ICD-10-CM | POA: Diagnosis not present

## 2018-08-13 DIAGNOSIS — S0011XA Contusion of right eyelid and periocular area, initial encounter: Secondary | ICD-10-CM | POA: Diagnosis not present

## 2018-08-13 DIAGNOSIS — H402223 Chronic angle-closure glaucoma, left eye, severe stage: Secondary | ICD-10-CM | POA: Diagnosis not present

## 2018-08-24 ENCOUNTER — Other Ambulatory Visit: Payer: Self-pay

## 2018-08-24 MED ORDER — MEMANTINE HCL 10 MG PO TABS: 10.0000 mg | ORAL_TABLET | Freq: Two times a day (BID) | ORAL | 3 refills | Status: AC

## 2018-09-01 DIAGNOSIS — E1165 Type 2 diabetes mellitus with hyperglycemia: Secondary | ICD-10-CM | POA: Diagnosis not present

## 2018-09-12 ENCOUNTER — Observation Stay (HOSPITAL_COMMUNITY)
Admission: EM | Admit: 2018-09-12 | Discharge: 2018-09-14 | Disposition: A | Discharge status: Home / Self Care | Payer: Medicare Other | Attending: Internal Medicine | Admitting: Internal Medicine

## 2018-09-12 ENCOUNTER — Observation Stay (HOSPITAL_COMMUNITY): Payer: Medicare Other

## 2018-09-12 ENCOUNTER — Encounter (HOSPITAL_COMMUNITY): Payer: Self-pay | Admitting: Emergency Medicine

## 2018-09-12 ENCOUNTER — Emergency Department (HOSPITAL_COMMUNITY): Payer: Medicare Other

## 2018-09-12 DIAGNOSIS — I10 Essential (primary) hypertension: Secondary | ICD-10-CM | POA: Insufficient documentation

## 2018-09-12 DIAGNOSIS — R410 Disorientation, unspecified: Secondary | ICD-10-CM | POA: Diagnosis not present

## 2018-09-12 DIAGNOSIS — R451 Restlessness and agitation: Secondary | ICD-10-CM | POA: Diagnosis not present

## 2018-09-12 DIAGNOSIS — E785 Hyperlipidemia, unspecified: Secondary | ICD-10-CM | POA: Insufficient documentation

## 2018-09-12 DIAGNOSIS — Z87891 Personal history of nicotine dependence: Secondary | ICD-10-CM | POA: Insufficient documentation

## 2018-09-12 DIAGNOSIS — R05 Cough: Secondary | ICD-10-CM | POA: Insufficient documentation

## 2018-09-12 DIAGNOSIS — R4182 Altered mental status, unspecified: Secondary | ICD-10-CM | POA: Diagnosis not present

## 2018-09-12 DIAGNOSIS — R0602 Shortness of breath: Secondary | ICD-10-CM | POA: Diagnosis not present

## 2018-09-12 DIAGNOSIS — R296 Repeated falls: Secondary | ICD-10-CM | POA: Insufficient documentation

## 2018-09-12 DIAGNOSIS — H409 Unspecified glaucoma: Secondary | ICD-10-CM | POA: Insufficient documentation

## 2018-09-12 DIAGNOSIS — Z8673 Personal history of transient ischemic attack (TIA), and cerebral infarction without residual deficits: Secondary | ICD-10-CM | POA: Diagnosis not present

## 2018-09-12 DIAGNOSIS — R069 Unspecified abnormalities of breathing: Secondary | ICD-10-CM | POA: Diagnosis not present

## 2018-09-12 DIAGNOSIS — Z794 Long term (current) use of insulin: Secondary | ICD-10-CM | POA: Diagnosis not present

## 2018-09-12 DIAGNOSIS — R0600 Dyspnea, unspecified: Secondary | ICD-10-CM | POA: Diagnosis not present

## 2018-09-12 DIAGNOSIS — Z79899 Other long term (current) drug therapy: Secondary | ICD-10-CM | POA: Diagnosis not present

## 2018-09-12 DIAGNOSIS — E119 Type 2 diabetes mellitus without complications: Secondary | ICD-10-CM | POA: Diagnosis not present

## 2018-09-12 DIAGNOSIS — F0390 Unspecified dementia without behavioral disturbance: Principal | ICD-10-CM | POA: Insufficient documentation

## 2018-09-12 DIAGNOSIS — G301 Alzheimer's disease with late onset: Secondary | ICD-10-CM

## 2018-09-12 DIAGNOSIS — F0280 Dementia in other diseases classified elsewhere without behavioral disturbance: Secondary | ICD-10-CM

## 2018-09-12 DIAGNOSIS — Z7982 Long term (current) use of aspirin: Secondary | ICD-10-CM | POA: Diagnosis not present

## 2018-09-12 DIAGNOSIS — R0902 Hypoxemia: Secondary | ICD-10-CM | POA: Diagnosis not present

## 2018-09-12 DIAGNOSIS — J9811 Atelectasis: Secondary | ICD-10-CM | POA: Diagnosis not present

## 2018-09-12 HISTORY — DX: Blindness, one eye, unspecified eye: H54.40

## 2018-09-12 HISTORY — DX: Unspecified osteoarthritis, unspecified site: M19.90

## 2018-09-12 HISTORY — DX: Unspecified chronic bronchitis: J42

## 2018-09-12 HISTORY — DX: Unspecified glaucoma: H40.9

## 2018-09-12 HISTORY — DX: Type 2 diabetes mellitus without complications: E11.9

## 2018-09-12 HISTORY — DX: Depression, unspecified: F32.A

## 2018-09-12 HISTORY — DX: Unspecified dementia, unspecified severity, without behavioral disturbance, psychotic disturbance, mood disturbance, and anxiety: F03.90

## 2018-09-12 HISTORY — DX: Major depressive disorder, single episode, unspecified: F32.9

## 2018-09-12 HISTORY — DX: Personal history of urinary calculi: Z87.442

## 2018-09-12 HISTORY — DX: Pneumonia, unspecified organism: J18.9

## 2018-09-12 HISTORY — DX: Gastro-esophageal reflux disease without esophagitis: K21.9

## 2018-09-12 HISTORY — DX: Urinary tract infection, site not specified: N39.0

## 2018-09-12 LAB — BASIC METABOLIC PANEL
Anion gap: 11 (ref 5–15)
BUN: 15 mg/dL (ref 8–23)
CO2: 25 mmol/L (ref 22–32)
CREATININE: 1.01 mg/dL (ref 0.61–1.24)
Calcium: 8.7 mg/dL — ABNORMAL LOW (ref 8.9–10.3)
Chloride: 100 mmol/L (ref 98–111)
GFR calc Af Amer: >60 mL/min (ref >60)
GFR calc non Af Amer: >60 mL/min (ref >60)
GLUCOSE: 160 mg/dL — AB (ref 70–99)
Potassium: 3.9 mmol/L (ref 3.5–5.1)
SODIUM: 136 mmol/L (ref 135–145)

## 2018-09-12 LAB — I-STAT VENOUS BLOOD GAS, ED
ACID-BASE EXCESS: 3 mmol/L — AB (ref 0.0–2.0)
Bicarbonate: 29.8 mmol/L — ABNORMAL HIGH (ref 20.0–28.0)
O2 SAT: 91 %
PCO2 VEN: 52 mmHg (ref 44.0–60.0)
TCO2: 31 mmol/L (ref 22–32)
pH, Ven: 7.366 (ref 7.250–7.430)
pO2, Ven: 65 mmHg — ABNORMAL HIGH (ref 32.0–45.0)

## 2018-09-12 LAB — CBC WITH DIFFERENTIAL/PLATELET
Abs Immature Granulocytes: 0.06 10*3/uL (ref 0.00–0.07)
BASOS PCT: 0 %
Basophils Absolute: 0 10*3/uL (ref 0.0–0.1)
EOS ABS: 0.2 10*3/uL (ref 0.0–0.5)
EOS PCT: 2 %
HCT: 41.7 % (ref 39.0–52.0)
Hemoglobin: 13.1 g/dL (ref 13.0–17.0)
Immature Granulocytes: 1 %
Lymphocytes Relative: 9 %
Lymphs Abs: 1 10*3/uL (ref 0.7–4.0)
MCH: 28.7 pg (ref 26.0–34.0)
MCHC: 31.4 g/dL (ref 30.0–36.0)
MCV: 91.2 fL (ref 80.0–100.0)
MONO ABS: 1 10*3/uL (ref 0.1–1.0)
Monocytes Relative: 9 %
Neutro Abs: 9.1 10*3/uL — ABNORMAL HIGH (ref 1.7–7.7)
Neutrophils Relative %: 79 %
PLATELETS: 202 10*3/uL (ref 150–400)
RBC: 4.57 MIL/uL (ref 4.22–5.81)
RDW: 13.5 % (ref 11.5–15.5)
WBC: 11.4 10*3/uL — AB (ref 4.0–10.5)
nRBC: 0 % (ref 0.0–0.2)

## 2018-09-12 LAB — URINALYSIS, ROUTINE W REFLEX MICROSCOPIC
Bilirubin Urine: NEGATIVE
GLUCOSE, UA: NEGATIVE mg/dL
Hgb urine dipstick: NEGATIVE
KETONES UR: NEGATIVE mg/dL
Leukocytes, UA: NEGATIVE
NITRITE: NEGATIVE
PH: 7 (ref 5.0–8.0)
Protein, ur: 30 mg/dL — AB
SPECIFIC GRAVITY, URINE: 1.015 (ref 1.005–1.030)

## 2018-09-12 LAB — STREP PNEUMONIAE URINARY ANTIGEN: Strep Pneumo Urinary Antigen: NEGATIVE

## 2018-09-12 LAB — I-STAT CG4 LACTIC ACID, ED
LACTIC ACID, VENOUS: 2 mmol/L — AB (ref 0.5–1.9)
LACTIC ACID, VENOUS: 1.14 mmol/L (ref 0.5–1.9)

## 2018-09-12 LAB — URINALYSIS, MICROSCOPIC (REFLEX)

## 2018-09-12 LAB — GLUCOSE, CAPILLARY
GLUCOSE-CAPILLARY: 276 mg/dL — AB (ref 70–99)
Glucose-Capillary: 289 mg/dL — ABNORMAL HIGH (ref 70–99)
Glucose-Capillary: 222 mg/dL — ABNORMAL HIGH (ref 70–99)
Glucose-Capillary: 187 mg/dL — ABNORMAL HIGH (ref 70–99)

## 2018-09-12 LAB — I-STAT TROPONIN, ED: TROPONIN I, POC: 0.01 ng/mL (ref 0.00–0.08)

## 2018-09-12 MED ORDER — ADULT MULTIVITAMIN W/MINERALS CH
1.0000 | ORAL_TABLET | Freq: Every day | ORAL | Status: DC
  Administered 2018-09-12 – 2018-09-14 (×3): 1 via ORAL
  Filled 2018-09-12 (×3): qty 1

## 2018-09-12 MED ORDER — SODIUM CHLORIDE 0.9 % IV SOLN
500.0000 mg | Freq: Once | INTRAVENOUS | Status: AC
  Administered 2018-09-12: 500 mg via INTRAVENOUS
  Filled 2018-09-12: qty 500

## 2018-09-12 MED ORDER — SODIUM CHLORIDE 0.9 % IV SOLN
1.0000 g | INTRAVENOUS | Status: DC
  Administered 2018-09-12 – 2018-09-14 (×2): 1 g via INTRAVENOUS
  Filled 2018-09-12 (×2): qty 10

## 2018-09-12 MED ORDER — BRIMONIDINE TARTRATE 0.2 % OP SOLN
1.0000 [drp] | Freq: Two times a day (BID) | OPHTHALMIC | Status: DC
  Administered 2018-09-12 – 2018-09-14 (×5): 1 [drp] via OPHTHALMIC
  Filled 2018-09-12: qty 5

## 2018-09-12 MED ORDER — TAMSULOSIN HCL 0.4 MG PO CAPS
0.4000 mg | ORAL_CAPSULE | Freq: Every day | ORAL | Status: DC
  Administered 2018-09-12 – 2018-09-13 (×2): 0.4 mg via ORAL
  Filled 2018-09-12 (×2): qty 1

## 2018-09-12 MED ORDER — INSULIN ASPART 100 UNIT/ML ~~LOC~~ SOLN
0.0000 [IU] | Freq: Three times a day (TID) | SUBCUTANEOUS | Status: DC
  Administered 2018-09-12: 3 [IU] via SUBCUTANEOUS
  Administered 2018-09-12: 5 [IU] via SUBCUTANEOUS
  Administered 2018-09-13: 2 [IU] via SUBCUTANEOUS
  Administered 2018-09-13: 1 [IU] via SUBCUTANEOUS
  Administered 2018-09-13 – 2018-09-14 (×2): 2 [IU] via SUBCUTANEOUS
  Administered 2018-09-14: 1 [IU] via SUBCUTANEOUS

## 2018-09-12 MED ORDER — LATANOPROST 0.005 % OP SOLN
1.0000 [drp] | Freq: Every day | OPHTHALMIC | Status: DC
  Administered 2018-09-12 – 2018-09-13 (×2): 1 [drp] via OPHTHALMIC
  Filled 2018-09-12: qty 2.5

## 2018-09-12 MED ORDER — BUPROPION HCL ER (SR) 150 MG PO TB12: 150.0000 mg | ORAL_TABLET | Freq: Every day | ORAL | Status: DC

## 2018-09-12 MED ORDER — ATORVASTATIN CALCIUM 40 MG PO TABS
40.0000 mg | ORAL_TABLET | Freq: Every day | ORAL | Status: DC
  Administered 2018-09-12 – 2018-09-13 (×2): 40 mg via ORAL
  Filled 2018-09-12 (×2): qty 1

## 2018-09-12 MED ORDER — PANTOPRAZOLE SODIUM 40 MG PO TBEC
40.0000 mg | DELAYED_RELEASE_TABLET | Freq: Every day | ORAL | Status: DC
  Administered 2018-09-12 – 2018-09-14 (×3): 40 mg via ORAL
  Filled 2018-09-12 (×3): qty 1

## 2018-09-12 MED ORDER — ENOXAPARIN SODIUM 40 MG/0.4ML ~~LOC~~ SOLN
40.0000 mg | SUBCUTANEOUS | Status: DC
  Administered 2018-09-12 – 2018-09-14 (×3): 40 mg via SUBCUTANEOUS
  Filled 2018-09-12 (×3): qty 0.4

## 2018-09-12 MED ORDER — BENAZEPRIL HCL 10 MG PO TABS
10.0000 mg | ORAL_TABLET | Freq: Every day | ORAL | Status: DC
  Administered 2018-09-12 – 2018-09-14 (×3): 10 mg via ORAL
  Filled 2018-09-12 (×3): qty 1

## 2018-09-12 MED ORDER — BUPROPION HCL ER (XL) 150 MG PO TB24
150.0000 mg | ORAL_TABLET | Freq: Every day | ORAL | Status: DC
  Administered 2018-09-12 – 2018-09-14 (×3): 150 mg via ORAL
  Filled 2018-09-12 (×3): qty 1

## 2018-09-12 MED ORDER — ASPIRIN-DIPYRIDAMOLE ER 25-200 MG PO CP12
1.0000 | ORAL_CAPSULE | Freq: Two times a day (BID) | ORAL | Status: DC
  Administered 2018-09-12 – 2018-09-14 (×5): 1 via ORAL
  Filled 2018-09-12 (×6): qty 1

## 2018-09-12 MED ORDER — SODIUM CHLORIDE 0.9 % IV SOLN
1.0000 g | Freq: Once | INTRAVENOUS | Status: AC
  Administered 2018-09-12: 1 g via INTRAVENOUS
  Filled 2018-09-12: qty 10

## 2018-09-12 MED ORDER — SODIUM CHLORIDE 0.9 % IV SOLN
INTRAVENOUS | Status: AC
  Administered 2018-09-12 – 2018-09-13 (×2): via INTRAVENOUS

## 2018-09-12 MED ORDER — ALBUTEROL SULFATE (2.5 MG/3ML) 0.083% IN NEBU: 2.5000 mg | INHALATION_SOLUTION | RESPIRATORY_TRACT | Status: DC | PRN

## 2018-09-12 MED ORDER — TIMOLOL MALEATE 0.5 % OP SOLN
1.0000 [drp] | Freq: Two times a day (BID) | OPHTHALMIC | Status: DC
  Administered 2018-09-12 – 2018-09-14 (×5): 1 [drp] via OPHTHALMIC
  Filled 2018-09-12: qty 5

## 2018-09-12 MED ORDER — GUAIFENESIN 200 MG PO TABS
200.0000 mg | ORAL_TABLET | ORAL | Status: DC | PRN
  Filled 2018-09-12: qty 1

## 2018-09-12 MED ORDER — SERTRALINE HCL 100 MG PO TABS
100.0000 mg | ORAL_TABLET | Freq: Every day | ORAL | Status: DC
  Administered 2018-09-12 – 2018-09-14 (×3): 100 mg via ORAL
  Filled 2018-09-12 (×3): qty 1

## 2018-09-12 MED ORDER — MIRABEGRON ER 25 MG PO TB24
25.0000 mg | ORAL_TABLET | Freq: Every day | ORAL | Status: DC
  Administered 2018-09-12 – 2018-09-13 (×2): 25 mg via ORAL
  Filled 2018-09-12 (×2): qty 1

## 2018-09-12 MED ORDER — QUETIAPINE 12.5 MG HALF TABLET
12.5000 mg | ORAL_TABLET | Freq: Two times a day (BID) | ORAL | Status: DC
  Administered 2018-09-12 – 2018-09-13 (×4): 12.5 mg via ORAL
  Filled 2018-09-12 (×5): qty 1

## 2018-09-12 MED ORDER — MEMANTINE HCL 10 MG PO TABS
10.0000 mg | ORAL_TABLET | Freq: Two times a day (BID) | ORAL | Status: DC
  Administered 2018-09-12 – 2018-09-14 (×5): 10 mg via ORAL
  Filled 2018-09-12 (×6): qty 1

## 2018-09-12 MED ORDER — SODIUM CHLORIDE 0.9 % IV SOLN
500.0000 mg | INTRAVENOUS | Status: DC
  Administered 2018-09-13: 500 mg via INTRAVENOUS
  Filled 2018-09-12: qty 500

## 2018-09-12 NOTE — ED Provider Notes (Signed)
[facility] Provider Note   CSN: [id number] Arrival date & time: 09/12/18  [id number]     History   Chief Complaint Chief Complaint  Patient presents with  . Shortness of Breath    HPI Darene LamerBrantley L Blanda is a 77 y.o. male.  77 yo M with a chief complaint of shortness of breath and increased work of breathing.  Per EMS the patient's breathing has significantly improved with breathing treatments.  He is also given steroids.  The patient with limited verbal response reportedly at his baseline per EMS.  Level 5 caveat dementia.  The history is provided by the patient and the EMS personnel.  Shortness of Breath  This is a new problem. The average episode lasts 2 days. The problem occurs continuously.The current episode started more than 2 days ago. The problem has been gradually worsening. Associated symptoms include cough and sputum production. Pertinent negatives include no fever, no headaches, no chest pain, no vomiting, no abdominal pain and no rash. He has tried ipratropium inhalers and beta-agonist inhalers for the symptoms. The treatment provided mild relief. He has had prior hospitalizations. He has had prior ED visits. He has had no prior ICU admissions.    Past Medical History:  Diagnosis Date  . Diabetes mellitus   . Hyperlipidemia   . Hypertension   . Stroke [facility](HCC)     Patient Active Problem List   Diagnosis Date Noted  . Vision, loss, sudden 06/03/2016  . CRAO (central retinal artery occlusion) 06/03/2016  . Alzheimer's disease (HCC) 01/16/2016  . Epididymo-orchitis 12/02/2013  . Orchitis and epididymitis 12/02/2013  . Mild cognitive impairment, so stated 02/17/2013  . CVA (cerebral infarction) 02/17/2013  . 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 Hypertension 04/03/2012  . Diabetes mellitus (HCC)  04/03/2012  . Hyperlipidemia     Past Surgical History:  Procedure Laterality Date  . KNEE ARTHROSCOPY    . NECK SURGERY          Home Medications    Prior to Admission medications   Medication Sig Start Date End Date Taking? Authorizing Provider  aspirin EC 325 MG tablet Take 1 tablet (325 mg total) by mouth daily. 02/23/18   Micki RileySethi, Pramod S, MD  atorvastatin (LIPITOR) 40 MG tablet Take 40 mg by mouth at bedtime.     [provider]  benazepril (LOTENSIN) 10 MG tablet Take 10 mg by mouth daily.    [provider]  brimonidine (ALPHAGAN) 0.2 % ophthalmic solution Place 1 drop into the right eye 2 (two) times daily.  02/25/18   [provider]  dipyridamole-aspirin (AGGRENOX) 200-25 MG 12hr capsule Take 1 capsule by mouth 2 (two) times daily.    [provider]  glimepiride (AMARYL) 4 MG tablet TK 1 T PO  WITH BRE OR THE FIRST MAIN MEAL OF THE DAY ONCE D . 06/12/16   [provider]  JARDIANCE 25 MG TABS tablet Take 25 mg by mouth daily.  06/28/16   [provider]  latanoprost (XALATAN) 0.005 % ophthalmic solution Place 1 drop into the right eye at bedtime.  06/02/16   [provider]  memantine (NAMENDA) 10 MG tablet Take 1 tablet (10 mg total) by mouth 2 (two) times daily. 08/24/18   Micki RileySethi, Pramod S, MD  mirabegron ER (MYRBETRIQ) 25 MG TB24 tablet Take 25 mg by mouth at bedtime.  [provider]  Multiple Vitamin (MULTIVITAMIN WITH MINERALS) TABS tablet Take 1 tablet by mouth daily.    [provider]  pantoprazole (PROTONIX) 40 MG tablet Take 40 mg by mouth daily.    [provider]  prednisoLONE acetate (PRED FORTE) 1 % ophthalmic suspension Place 1 drop into the left eye daily.    [provider]  predniSONE (DELTASONE) 20 MG tablet Take 2 tablets (40 mg total) by mouth daily. 03/03/18   Renne Crigler, PA-C  Semaglutide (OZEMPIC [place]) Inject 1 mg into the skin once a week.     [provider]  sertraline (ZOLOFT) 100 MG tablet Take 1 tablet (100 mg total) by mouth daily. 03/02/17   Micki Riley, MD  tamsulosin (FLOMAX) 0.4 MG CAPS capsule Take 0.4 mg by mouth at bedtime.  12/26/16   [provider]  timolol (TIMOPTIC) 0.5 % ophthalmic solution Place 1 drop into the right eye 2 (two) times daily.    [provider]    Family History No family history on file.  Social History Social History   Tobacco Use  . Smoking status: Former Games developer  . Smokeless tobacco: Never Used  Substance Use Topics  . Alcohol use: No  . Drug use: No     Allergies   Patient has no known allergies.   Review of Systems Review of Systems  Constitutional: Negative for chills and fever.  HENT: Negative for congestion and facial swelling.   Eyes: Negative for discharge and visual disturbance.  Respiratory: Positive for cough, sputum production and shortness of breath.   Cardiovascular: Negative for chest pain and palpitations.  Gastrointestinal: Negative for abdominal pain, diarrhea and vomiting.  Musculoskeletal: Negative for arthralgias and myalgias.  Skin: Negative for color change and rash.  Neurological: Negative for tremors, syncope and headaches.  Psychiatric/Behavioral: Negative for confusion and dysphoric mood.     Physical Exam Updated Vital Signs BP (!) 147/78   Pulse 89   Temp 99.2 F (37.3 C) (Rectal)   Resp (!) 24   SpO2 92%   Physical Exam Vitals signs and nursing note reviewed.  Constitutional:      Appearance: He is well-developed.  HENT:     Head: Normocephalic and atraumatic.  Eyes:     Pupils: Pupils are equal, round, and reactive to light.  Neck:     Musculoskeletal: Normal range of motion and neck supple.     Vascular: No JVD.  Cardiovascular:     Rate and Rhythm: Normal rate and regular rhythm.     Heart sounds: No murmur. No friction rub. No gallop.   Pulmonary:     Effort: No respiratory distress.     Breath sounds:  No wheezing.     Comments: Coarse breath sounds in all fields, transmitted upper airway noises. Abdominal:     General: There is no distension.     Tenderness: There is no guarding or rebound.  Musculoskeletal: Normal range of motion.     Right lower leg: No edema.     Left lower leg: No edema.  Skin:    Coloration: Skin is not pale.     Findings: No rash.  Neurological:     Mental Status: He is alert.  Psychiatric:        Behavior: Behavior normal.      ED Treatments / Results  Labs (all labs ordered are listed, but only abnormal results are displayed) Labs Reviewed  CBC WITH DIFFERENTIAL/PLATELET - Abnormal; Notable  for the following components:      Result Value   WBC 11.4 (*)    Neutro Abs 9.1 (*)    All other components within normal limits  BASIC METABOLIC PANEL - Abnormal; Notable for the following components:   Glucose, Bld 160 (*)    Calcium 8.7 (*)    All other components within normal limits  URINALYSIS, ROUTINE W REFLEX MICROSCOPIC - Abnormal; Notable for the following components:   Protein, ur 30 (*)    All other components within normal limits  URINALYSIS, MICROSCOPIC (REFLEX) - Abnormal; Notable for the following components:   Bacteria, UA FEW (*)    All other components within normal limits  I-STAT VENOUS BLOOD GAS, ED - Abnormal; Notable for the following components:   pO2, Ven 65.0 (*)    Bicarbonate 29.8 (*)    Acid-Base Excess 3.0 (*)    All other components within normal limits  I-STAT CG4 LACTIC ACID, ED - Abnormal; Notable for the following components:   Lactic Acid, Venous 2.00 (*)    All other components within normal limits  CULTURE, BLOOD (ROUTINE X 2)  CULTURE, BLOOD (ROUTINE X 2)  URINE CULTURE  BLOOD GAS, VENOUS  I-STAT TROPONIN, ED    EKG EKG Interpretation  Date/Time:  Sunday September 12 2018 04:13:47 EST Ventricular Rate:  88 PR Interval:    QRS Duration: 99 QT Interval:  379 QTC Calculation: 459 R Axis:   76 Text  Interpretation:  Sinus rhythm No significant change since last tracing Confirmed by ,  ([id number]) on 09/12/2018 4:30:18 AM   Radiology Dg Chest Port 1 View  Result Date: 09/12/2018 CLINICAL DATA:  Shortness of breath for a week, hypoxia. EXAM: PORTABLE CHEST 1 VIEW COMPARISON:  Chest radiograph March 03, 2018. FINDINGS: Cardiomediastinal silhouette is normal. Similar fullness of the hila. Persistently elevated RIGHT hemidiaphragm with bibasilar strandy densities. No pleural effusion or focal consolidation. Punctate granuloma LEFT lung base. Biapical pleural thickening. No pneumothorax. Soft tissue planes and included osseous structures are non suspicious. IMPRESSION: 1. Bibasilar atelectasis. Electronically Signed   By: Courtnay  Bloomer M.D.   On: 09/12/2018 04:50    Procedures Procedures (including critical care time)  Medications Ordered in ED Medications  cefTRIAXone (ROCEPHIN) 1 g in sodium chloride 0.9 % 100 mL IVPB (1 g Intravenous New Bag/Given 09/12/18 0636)  azithromycin (ZITHROMAX) 500 mg in sodium chloride 0.9 % 250 mL IVPB (has no administration in time range)     Initial Impression / Assessment and Plan / ED Course  I have reviewed the triage vital signs and the nursing notes.  Pertinent labs & imaging results that were available during my care of the patient were reviewed by me and considered in my medical decision making (see chart for details).     77  yo M with a chief complaint of shortness of breath and worsening work of breathing.  The patient was recently started on breathing treatments over the past week and had worsening trouble breathing this evening despite them.  Had some improvement with EMS in route.  Reportedly at his baseline.  Appears to have a likely URI on my exam, will obtain a chest x-ray lab work reassess.  Patient's family has arrived and they feel that he has been more agitated than normal.  Seems to come and go.  She started having symptoms  today.  She had old breathing treatments from an old upper respiratory infection about a year ago.  She tried some at home  but without improvement.  She thinks that he is persistently agitated here.  Also was noted to have an oxygen saturation of 86% on room air.  Not normally on oxygen at home.  Requiring 2 L here.  Initial temperature was normal, may be falsely low with hyperventilation will get a rectal exam.  Get a urine sample.  Lactic acid.  Lactic acid at 2.  CXR viewed by me without obvious pna.  Will treat for presumed pneumonia.  Discuss with hospitalist.   The patients results and plan were reviewed and discussed.   Any x-rays performed were independently reviewed by myself.   Differential diagnosis were considered with the presenting HPI.  Medications  cefTRIAXone (ROCEPHIN) 1 g in sodium chloride 0.9 % 100 mL IVPB (1 g Intravenous New Bag/Given 09/12/18 0636)  azithromycin (ZITHROMAX) 500 mg in sodium chloride 0.9 % 250 mL IVPB (has no administration in time range)    Vitals:   09/12/18 0545 09/12/18 0600 09/12/18 0609 09/12/18 0630  BP: (!) 152/79 135/64  (!) 147/78  Pulse: 94 (!) 102  89  Resp: 19 (!) 25  (!) 24  Temp:   99.2 F (37.3 C)   TempSrc:   Rectal   SpO2: 94% 91%  92%    Final diagnoses:  Hypoxia  Delirium    Admission/ observation were discussed with the admitting physician, patient and/or family and they are comfortable with the plan.    Final Clinical Impressions(s) / ED Diagnoses   Final diagnoses:  Hypoxia  Delirium    ED Discharge Orders    None       Melene PlanFloyd, Alann Avey, DO 09/12/18 [phone]

## 2018-09-12 NOTE — H&P (Addendum)
History and Physical    Daniel Morrow YNW:[id number] DOB: 03/15/1941 DOA: 09/12/2018  PCP: Kirby FunkGriffin, John, MD   PatientDarene Morrow coming from: Home  Chief Complaint: Shortness of breath  HPI: Daniel Morrow is a 77 y.o. male with medical history significant for Diabetes mellitus, hyperlipidemia, hypertension, history of stroke in 2013, brought to the ER for evaluation of worsening cough, worsening work of breathing, slowed response.  Patient is unable to provide history, wife is at the bedside helping with history.  Since his stroke patient has slowly been having issue with his memories/cognition, followed by neurologist and on currently on Namenda.  Patient is having "horrible cough" for the last 2 to 3 days, she was out today for a few hours when she came back to see that garage door was open and patient was having worsening cough, sore throat, barely talking his eye was red so brought to the ER for evaluation.  Patient has had a fall recently, has home health services.  Wife thinks he may have gone outside in the cold and symptoms may have gotten worse. No report of nausea, vomiting, abdominal pain, focal weakness, headache, chest pain, diarrhea/hematochezia, melena.  ED Course: Vital signs stable intermittently tachypneic up to 25, T-max 99.5.Routine lab with mild leukocytosis 11,400, mildly elevated lactate of 2.0 on subsequent recheck 1.1, troponin 0.0.  Chest x-ray showed bibasilar atelectasis.  Patient was suspected to have possible early pneumonia/bronchitis, was given ceftriaxone azithromycin and admission was requested.  Review of Systems: Unable to obtain full review of systems due to his mental status however patient denies any other complaint except as mentioned in HPI   Past Medical History:  Diagnosis Date  . Diabetes mellitus   . Hyperlipidemia   . Hypertension   . Stroke [facility](HCC)     Past Surgical History:  Procedure Laterality Date  . KNEE ARTHROSCOPY    . NECK SURGERY       reports that he has quit smoking. He has never used smokeless tobacco. He reports that he does not drink alcohol or use drugs.  No Known Allergies  No family history on file.   Prior to Admission medications   Medication Sig Start Date End Date Taking? Authorizing Provider  atorvastatin (LIPITOR) 40 MG tablet Take 40 mg by mouth at bedtime.    Yes [provider]  benazepril (LOTENSIN) 10 MG tablet Take 10 mg by mouth daily.   Yes [provider]  brimonidine (ALPHAGAN) 0.2 % ophthalmic solution Place 1 drop into the right eye 2 (two) times daily.  02/25/18  Yes [provider]  buPROPion (WELLBUTRIN SR) 150 MG 12 hr tablet Take 150 mg by mouth daily.   Yes [provider]  dipyridamole-aspirin (AGGRENOX) 200-25 MG 12hr capsule Take 1 capsule by mouth 2 (two) times daily.   Yes [provider]  glimepiride (AMARYL) 4 MG tablet Take 4 mg by mouth daily.  06/12/16  Yes [provider]  latanoprost (XALATAN) 0.005 % ophthalmic solution Place 1 drop into the right eye at bedtime.  06/02/16  Yes [provider]  memantine (NAMENDA) 10 MG tablet Take 1 tablet (10 mg total) by mouth 2 (two) times daily. 08/24/18  Yes Micki RileySethi, Pramod S, MD  mirabegron ER (MYRBETRIQ) 25 MG TB24 tablet Take 25 mg by mouth at bedtime.   Yes [provider]  Multiple Vitamin (MULTIVITAMIN WITH MINERALS) TABS tablet Take 1 tablet by mouth daily.   Yes [provider]  pantoprazole (PROTONIX) 40  MG tablet Take 40 mg by mouth daily.   Yes [provider]  Semaglutide (OZEMPIC [place]) Inject 1 mg into the skin once a week.    Yes [provider]  sertraline (ZOLOFT) 100 MG tablet Take 1 tablet (100 mg total) by mouth daily. 03/02/17  Yes Micki Riley, MD  tamsulosin (FLOMAX) 0.4 MG CAPS capsule Take 0.4 mg by mouth at bedtime.  12/26/16  Yes [provider]  timolol (TIMOPTIC) 0.5 % ophthalmic solution Place 1 drop into the right  eye 2 (two) times daily.   Yes [provider]  aspirin EC 325 MG tablet Take 1 tablet (325 mg total) by mouth daily. Patient not taking: Reported on 09/12/2018 02/23/18   Micki Riley, MD  predniSONE (DELTASONE) 20 MG tablet Take 2 tablets (40 mg total) by mouth daily. Patient not taking: Reported on 09/12/2018 03/03/18   Renne Crigler, PA-C    Physical Exam: Vitals:   09/12/18 0715 09/12/18 0730 09/12/18 0745 09/12/18 0807  BP: 137/74 133/80 (!) 128/98   Pulse: 96 96 94   Resp: 15 20    Temp:    98.6 F (37 C)  TempSrc:    Oral  SpO2: 91% (!) 89% 93%     Constitutional: NAD, calm, comfortable Vitals:   09/12/18 0715 09/12/18 0730 09/12/18 0745 09/12/18 0807  BP: 137/74 133/80 (!) 128/98   Pulse: 96 96 94   Resp: 15 20    Temp:    98.6 F (37 C)  TempSrc:    Oral  SpO2: 91% (!) 89% 93%     General : Alert awake, chronically sick looking,.   Eyes: PERRL, lids and conjunctivae normal ENMT: Mucous membranes are moist. Posterior pharynx clear of any exudate or lesions. Normal dentition.  Neck: Normal, supple, no masses, no thyromegaly Respiratory: Bilaterally clear to auscultation, with prolonged expiratory phase,Normal respiratory effort.No accessory muscle use.  Cardiovascular: Regular rate and rhythm, no murmurs / rubs / gallops.2+ pedal pulses. No carotid bruits.  Abdomen: Soft, nontender, no masses palpated, bowel sounds present Musculoskeletal: no clubbing/cyanosis. No joint deformity upper and lower extremities. Good ROM, no contractures. Normal muscle tone.  Skin: No rashes, lesions, ulcers.No induration Neurologic: CN 2-12 grossly intact. Sensation intact, able to move upper and lower extremities with no focal weakness Psychiatric: Alert awake oriented to self, place, people, seems to be coming close to baseline now.   Bruise on left flank. Foley Catheter:  Labs on Admission: I have personally reviewed following labs and imaging studies  CBC: Recent  Labs  Lab 09/12/18 0425  WBC 11.4*  NEUTROABS 9.1*  HGB 13.1  HCT 41.7  MCV 91.2  PLT 202   Basic Metabolic Panel: Recent Labs  Lab 09/12/18 0425  NA 136  K 3.9  CL 100  CO2 25  GLUCOSE 160*  BUN 15  CREATININE 1.01  CALCIUM 8.7*   GFR: CrCl cannot be calculated (Unknown ideal weight.). Liver Function Tests: No results for input(s): AST, ALT, ALKPHOS, BILITOT, PROT, ALBUMIN in the last 168 hours. No results for input(s): LIPASE, AMYLASE in the last 168 hours. No results for input(s): AMMONIA in the last 168 hours. Coagulation Profile: No results for input(s): INR, PROTIME in the last 168 hours. Cardiac Enzymes: No results for input(s): CKTOTAL, CKMB, CKMBINDEX, TROPONINI in the last 168 hours. BNP (last 3 results) No results for input(s): PROBNP in the last 8760 hours. HbA1C: No results for input(s): HGBA1C in the last 72 hours. CBG: No  results for input(s): GLUCAP in the last 168 hours. Lipid Profile: No results for input(s): CHOL, HDL, LDLCALC, TRIG, CHOLHDL, LDLDIRECT in the last 72 hours. Thyroid Function Tests: No results for input(s): TSH, T4TOTAL, FREET4, T3FREE, THYROIDAB in the last 72 hours. Anemia Panel: No results for input(s): VITAMINB12, FOLATE, FERRITIN, TIBC, IRON, RETICCTPCT in the last 72 hours. Urine analysis:    Component Value Date/Time   COLORURINE YELLOW 09/12/2018 0620   APPEARANCEUR CLEAR 09/12/2018 0620   LABSPEC 1.015 09/12/2018 0620   PHURINE 7.0 09/12/2018 0620   GLUCOSEU NEGATIVE 09/12/2018 0620   HGBUR NEGATIVE 09/12/2018 0620   BILIRUBINUR NEGATIVE 09/12/2018 0620   KETONESUR NEGATIVE 09/12/2018 0620   PROTEINUR 30 (A) 09/12/2018 0620   UROBILINOGEN 0.2 12/02/2013 0611   NITRITE NEGATIVE 09/12/2018 0620   LEUKOCYTESUR NEGATIVE 09/12/2018 0620    Radiological Exams on Admission: Dg Chest Port 1 View  Result Date: 09/12/2018 CLINICAL DATA:  Shortness of breath for a week, hypoxia. EXAM: PORTABLE CHEST 1 VIEW COMPARISON:   Chest radiograph March 03, 2018. FINDINGS: Cardiomediastinal silhouette is normal. Similar fullness of the hila. Persistently elevated RIGHT hemidiaphragm with bibasilar strandy densities. No pleural effusion or focal consolidation. Punctate granuloma LEFT lung base. Biapical pleural thickening. No pneumothorax. Soft tissue planes and included osseous structures are non suspicious. IMPRESSION: 1. Bibasilar atelectasis. Electronically Signed   By: Awilda Metroourtnay  Bloomer M.D.   On: 09/12/2018 04:50     Assessment/Plan  Shortness of breath and cough: No obvious pneumonia on a chest x-ray however symptoms present for 2 to 3 days, with mild leukocytosis, ?bronchitisearly pneumonia. We will empirically cont on ceftriaxone/azithromycin, follow-up culture data strep antigen, Legionella antigen.  If remains afebrile next day can de-escalate antibiotics quickly and discharge on home p.o. antibiotics.  Keep him on bronchodilator regimen.    Diabetes mellitus : Last hemoglobin A1c 8.9 in 2013.  Check hemoglobin A1c.  Keep him on sliding scale insulin, hold OHA.  Hyperlipidemia : cont statins  Hypertension : Blood pressure acceptable.  History of stroke in 2013 and mentation slowed since then, followed by neurologist, on Namenda.  Wife is wondering if he has Parkinson disease but has been evaluated by the neurologist.  We will keep him on supportive care, obtain PT OT evaluation. Cont home statins  Recent multiple falls at home as per the wife.  Obtain PT OT.  Patient already has home health set up.   Severity of Illness: The appropriate patient status for this patient is OBSERVATION. Observation status is judged to be reasonable and necessary in order to provide the required intensity of service to ensure the patient's safety. The patient's presenting symptoms, physical exam findings, and initial radiographic and laboratory data in the context of their medical condition is felt to place them at decreased risk for  further clinical deterioration. Furthermore, it is anticipated that the patient will be medically stable for discharge from the hospital within 2 midnights of admission.    DVT prophylaxis:  SCD/Lovenox Code Status: Full code Family Communication: Admission, patients condition and plan of care including tests being ordered have been discussed with the patient's wife who indicate understanding and agree with the plan and Code Status.  Consults called:  Lanae Boastamesh Janelys Glassner MD [facility] Pager [phone]  If 7PM-7AM, please contact night-coverage [email or web address] Password [email or web address]  09/12/2018, 9:02 AM

## 2018-09-12 NOTE — Progress Notes (Signed)
Spoke to MD earlier this afternoon, he advised that he approved of the 1:1 sitter - verbal order placed,

## 2018-09-12 NOTE — ED Triage Notes (Signed)
Pt arrives via [facility] for c/o worsening sob over the past week. Ems reports pt has been doing at home breathing treatments per his wife, unsure of specific diagnosis. Wife reported that pt had neb before bed and continued to become more sob. O2 sat 86% on room air upon fire arrival, pt received 7.5mg  albuterol, 0.5mg  atrovent and 125mg  solumedrol en route. Per ems assessment, wheezing in all lung fields but work of breathing has improved with meds. cbg 163, HR 88, BP 150/92. Hx of cva with some cognitive deficits but at baseline currently.

## 2018-09-12 NOTE — ED Notes (Signed)
Pt sats low but he will not keep nasal 02 in polace  He is wearing the cannula on his nose not in it

## 2018-09-12 NOTE — ED Notes (Signed)
ED Provider at bedside. 

## 2018-09-12 NOTE — ED Notes (Signed)
Wife states mentation is improved, however, not back to norm.

## 2018-09-12 NOTE — ED Notes (Signed)
Admitting md at bedside

## 2018-09-13 DIAGNOSIS — G20 Parkinson's disease: Secondary | ICD-10-CM | POA: Diagnosis not present

## 2018-09-13 DIAGNOSIS — R0602 Shortness of breath: Secondary | ICD-10-CM | POA: Diagnosis not present

## 2018-09-13 LAB — CBC
HCT: 37 % — ABNORMAL LOW (ref 39.0–52.0)
Hemoglobin: 11.8 g/dL — ABNORMAL LOW (ref 13.0–17.0)
MCH: 28.2 pg (ref 26.0–34.0)
MCHC: 31.9 g/dL (ref 30.0–36.0)
MCV: 88.5 fL (ref 80.0–100.0)
NRBC: 0 % (ref 0.0–0.2)
PLATELETS: 199 10*3/uL (ref 150–400)
RBC: 4.18 MIL/uL — ABNORMAL LOW (ref 4.22–5.81)
RDW: 13.5 % (ref 11.5–15.5)
WBC: 10.1 10*3/uL (ref 4.0–10.5)

## 2018-09-13 LAB — GLUCOSE, CAPILLARY
GLUCOSE-CAPILLARY: 129 mg/dL — AB (ref 70–99)
GLUCOSE-CAPILLARY: 178 mg/dL — AB (ref 70–99)
Glucose-Capillary: 164 mg/dL — ABNORMAL HIGH (ref 70–99)
Glucose-Capillary: 197 mg/dL — ABNORMAL HIGH (ref 70–99)

## 2018-09-13 LAB — LEGIONELLA PNEUMOPHILA SEROGP 1 UR AG: L. pneumophila Serogp 1 Ur Ag: NEGATIVE

## 2018-09-13 LAB — URINE CULTURE: Culture: >=100000 — AB

## 2018-09-13 LAB — HEMOGLOBIN A1C
Hgb A1c MFr Bld: 7.4 % — ABNORMAL HIGH (ref 4.8–5.6)
Mean Plasma Glucose: 165.68 mg/dL

## 2018-09-13 LAB — AMMONIA: Ammonia: 29 umol/L (ref 9–35)

## 2018-09-13 LAB — TSH: TSH: 1.394 u[IU]/mL (ref 0.350–4.500)

## 2018-09-13 LAB — VITAMIN B12: Vitamin B-12: 300 pg/mL (ref 180–914)

## 2018-09-13 MED ORDER — HALOPERIDOL LACTATE 5 MG/ML IJ SOLN
5.0000 mg | Freq: Once | INTRAMUSCULAR | Status: AC
  Administered 2018-09-13: 5 mg via INTRAVENOUS
  Filled 2018-09-13: qty 1

## 2018-09-13 MED ORDER — AZITHROMYCIN 250 MG PO TABS
500.0000 mg | ORAL_TABLET | Freq: Every day | ORAL | Status: DC
  Administered 2018-09-13: 500 mg via ORAL
  Filled 2018-09-13 (×2): qty 2

## 2018-09-13 NOTE — Evaluation (Signed)
Occupational Therapy Evaluation Patient Details Name: Daniel LamerBrantley L Pall MRN: [id number] DOB: 09/10/1941 Today's Date: 09/13/2018    History of Present Illness 77 y.o. male with medical history significant for Diabetes mellitus, hyperlipidemia, hypertension, history of stroke in 2013, brought to the ER for evaluation of worsening cough, worsening work of breathing, slowed response.  CT reveals old infarct and no acute event.     Clinical Impression   This 77 y/o male presents with the above. PLOF obtained via chart review (PT having obtained from spouse earlier today). At baseline pt was completing short distance mobility without AD, receiving some assist for iADLs only. Pt presenting with cognitive impairments, decreased standing balance and activity tolerance. Pt requiring modA for stand pivot transfers using RW this session (+2 utilized for safety). Pt noted to have strong posterior bias upon standing requiring consistent assist to steady. Pt currently requires minA for UB ADL, maxA for LB ADLs. Pt will benefit from continued acute OT services and recommend follow up therapy services in SNF setting after discharge to maximize his overall safety and independence with ADLs and mobility. Will follow.     Follow Up Recommendations  SNF;Supervision/Assistance - 24 hour    Equipment Recommendations  3 in 1 bedside commode;Other (comment)(TBD in next venue)           Precautions / Restrictions Precautions Precautions: Fall Precaution Comments: hx of falling,possible unwitnessed fall prior to hospitalization Restrictions Weight Bearing Restrictions: No      Mobility Bed Mobility Overal bed mobility: Needs Assistance Bed Mobility: Supine to Sit;Rolling Rolling: Mod assist   Supine to sit: Mod assist     General bed mobility comments: assist for trunk elevation; cues to scoot hips towards EOB with increased time to perform; provided assist start of session for rolling at bed level while  NT performs pericare due to incontinent of bladder   Transfers Overall transfer level: Needs assistance Equipment used: Rolling walker (2 wheeled) Transfers: Sit to/from [employer] CorporationStand;Stand Pivot Transfers Sit to Stand: Mod assist Stand pivot transfers: Mod assist;+2 safety/equipment       General transfer comment: assist to boost into standing and to steady at RW; pt with posterior bias upon standing, trialed taking small steps forward and back with pt having slight posterior LOB with mobility; after short standing rest break pt able to take pivotal steps to turn towards recliner with overall modA (improvements in posterior bias during transfer), NT present to provide +2/safety assist     Balance Overall balance assessment: Needs assistance Sitting-balance support: Feet supported;No upper extremity supported Sitting balance-Leahy Scale: Fair     Standing balance support: Bilateral upper extremity supported Standing balance-Leahy Scale: Poor                             ADL either performed or assessed with clinical judgement   ADL Overall ADL's : Needs assistance/impaired Eating/Feeding: Set up;Supervision/ safety;Sitting   Grooming: Minimal assistance;Sitting   Upper Body Bathing: Minimal assistance;Sitting   Lower Body Bathing: Moderate assistance;Sit to/from stand   Upper Body Dressing : Minimal assistance;Sitting   Lower Body Dressing: Maximal assistance;Sit to/from stand Lower Body Dressing Details (indicate cue type and reason): min-modA standing balance Toilet Transfer: Moderate assistance;Stand-pivot;BSC;RW Toilet Transfer Details (indicate cue type and reason): simulated in transfer to recliner Toileting- Clothing Manipulation and Hygiene: Maximal assistance;Sit to/from stand       Functional mobility during ADLs: Moderate assistance;Rolling walker;+2 for safety/equipment(stand pivot transfer)  Vision Baseline Vision/History: Glaucoma(L  eye) Additional Comments: pt tending to maintain L eye closed, family reports this is somewhat baseline due to glaucoma and hx of surgery in this eye      Perception     Praxis      Pertinent Vitals/Pain Pain Assessment: No/denies pain     Hand Dominance     Extremity/Trunk Assessment Upper Extremity Assessment Upper Extremity Assessment: Generalized weakness;RUE deficits/detail;LUE deficits/detail RUE Deficits / Details: noted intention tremors and decreased coordination with finger to nose LUE Deficits / Details: noted intention tremors and decreased coordination with finger to nose   Lower Extremity Assessment Lower Extremity Assessment: Defer to PT evaluation       Communication Communication Communication: HOH   Cognition Arousal/Alertness: Lethargic Behavior During Therapy: Flat affect Overall Cognitive Status: History of cognitive impairments - at baseline Area of Impairment: Orientation;Attention;Memory;Following commands;Safety/judgement;Awareness;Problem solving                   Current Attention Level: Selective Memory: Decreased short-term memory Following Commands: Follows one step commands inconsistently;Follows one step commands with increased time Safety/Judgement: Decreased awareness of safety Awareness: Emergent Problem Solving: Slow processing;Decreased initiation;Difficulty sequencing;Requires verbal cues;Requires tactile cues General Comments: pt somewhat lethargic, limited verbalizations but does respond when cued. requires multimodal cues to follow commands   General Comments  pt on RA with mobility and SpO2 above 93%. Neuro PA present during session and having lengthy discussion with pt spouse/daughter, conversation continuing after OT left room     Exercises     Shoulder Instructions      Home Living Family/patient expects to be discharged to:: Private residence Living Arrangements: Spouse/significant other Available Help at  Discharge: Family;Available 24 hours/day Type of Home: House Home Access: Stairs to enter [employer] of Steps: 4   Home Layout: One level;Other (Comment)(1 step den to dining room, site of at least one fall)     Bathroom Shower/Tub: Dietitian: None          Prior Functioning/Environment Level of Independence: Needs assistance  Gait / Transfers Assistance Needed: ambulates limited community distances without AD ADL's / Homemaking Assistance Needed: requires assist for iADLs            OT Problem List: Decreased strength;Decreased range of motion;Decreased activity tolerance;Impaired balance (sitting and/or standing);Decreased knowledge of use of DME or AE;Decreased cognition      OT Treatment/Interventions: Self-care/ADL training;Therapeutic exercise;DME and/or AE instruction;Therapeutic activities;Visual/perceptual remediation/compensation;Patient/family education;Balance training    OT Goals(Current goals can be found in the care plan section) Acute Rehab OT Goals Patient Stated Goal: none stated OT Goal Formulation: With patient Time For Goal Achievement: 09/27/18 Potential to Achieve Goals: Good  OT Frequency: Min 2X/week   Barriers to D/C:            Co-evaluation              AM-PAC OT "6 Clicks" Daily Activity     Outcome Measure Help from another person eating meals?: None Help from another person taking care of personal grooming?: A Little Help from another person toileting, which includes using toliet, bedpan, or urinal?: A Lot Help from another person bathing (including washing, rinsing, drying)?: A Lot Help from another person to put on and taking off regular upper body clothing?: A Lot Help from another person to put on and taking off regular lower body clothing?: A Lot 6 Click Score: 15   End  of Session Equipment Utilized During Treatment: Gait belt;Rolling walker Nurse Communication: Mobility  status  Activity Tolerance: Patient tolerated treatment well Patient left: in chair;with call bell/phone within reach;with nursing/sitter in room;with family/visitor present  OT Visit Diagnosis: Unsteadiness on feet (R26.81);Other abnormalities of gait and mobility (R26.89)                Time: [id number] OT Time Calculation (min): 30 min Charges:  OT General Charges $OT Visit: 1 Visit OT Evaluation $OT Eval Moderate Complexity: 1 Mod OT Treatments $Self Care/Home Management : 8-22 mins  Marcy SirenBreanna Flor Houdeshell, OT [facility] Pager [phone] Office [phone]   Orlando PennerBreanna L Lakshmi Sundeen 09/13/2018, 5:06 PM

## 2018-09-13 NOTE — Progress Notes (Signed)
PROGRESS NOTE    Daniel Morrow  ZOX:[id number] DOB: 08/07/1941 DOA: 09/12/2018 PCP: Kirby FunkGriffin, John, MD   Brief Narrative: 77 y.o. male with medical history significant for Diabetes mellitus, hyperlipidemia, hypertension, history of stroke in 2013, mild dementia, resting tremor brought to the ER for evaluation of worsening cough, worsening work of breathing. Patient is having "horrible cough" for the last 2 to 3 days PTA, she was out 12/22 am for a few hours when she came back to see,  garage door was open and patient was having worsening cough, sore throat, barely talking and his eye was red so brought to the ER for evaluation.Wife thinks he may have gone outside in the cold and symptoms may have gotten worse. Patient has had a fall recently, has home health services.  .  ED Course: Vital signs stable intermittently tachypneic up to 25, T-max 99.5.Routine lab with mild leukocytosis 11,400, mildly elevated lactate of 2.0 on subsequent recheck 1.1, troponin 0.0.  Chest x-ray showed bibasilar atelectasis.  Patient was suspected to have possible early pneumonia/bronchitis, was given ceftriaxone azithromycin and admission was requested  Assessment & Plan:   Shortness of breath and cough: No obvious pneumonia on a chest x-ray however symptoms present for 2 to 3 days, with mild leukocytosis, ?bronchitis vs early pneumonia.  Leukocytosis improved  to 10.1K, afebrile overnight. mpirically cont on ceftriaxone/azithromycin, follow-up culture data strep antigen, Legionella antigen.  If remains afebrile next day can de-escalate antibiotics quickly and discharge on home p.o. antibiotics.  Keep him on bronchodilator regimen.    Agitation, with baseline cognitive dysfunction/dementia he has been going downhill since his stroke in 2013.  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  Patient placed on low-dose Seroquel to help with agitation now more sleepy will just keep on nightly Seroquel.Suspecting possible delirium due to  infection/hospitalization.  Family concerned that he has gone downhill, concern about possible Parkinson's disease. Will consult neurology. Obtain PT OT.  Check ammonia TSH and B12 level.  UA without evidence of UTI  urine culture with multiple bacteria suspect contamination.  Reviewed his Neurologist note in June 2019, he is on Namenda 10 twice daily.  He does have a mild resting tremor. Some rigidity noted.  Diabetes mellitus: Controlled.  hba1c is at 7.4. Cont sliding scale insulin, hold OHA.  Hyperlipidemia : cont statins  Hypertension : Blood pressure acceptable.  History of stroke in 2013 and mentation slowed since then, followed by neurologist, on Namenda.  Wife is wondering if he has Parkinson disease but has been evaluated by the neurologist.  We will keep him on supportive care, obtain PT OT evaluation. Cont home statins  Recent multiple falls at home as per the wife.  Obtain PT OT.  Patient already has home health set up.  DVT prophylaxis: Lovenox Code Status: FULL Family Communication: Patient's wife and daughter.  They are concerned about other neurological process, neurology input awaited. Disposition Plan: SNF. PT/OT eval.   Consultants: None  Procedures: None  Antimicrobials: Rocephin/azithromycin   Subjective: Seen this morning.  Patient was placed in bedside chair, appears lethargic but woke up on calling able to stick his tongue out on command and move his upper extremities or lower extremities.  Easily falls back to sleep. Placed on low-dose Seroquel yesterday for agitation, has bedside sitter and mitten in place to protect the lines  Objective: Vitals:   09/12/18 0945 09/12/18 1015 09/12/18 2053 09/13/18 0500  BP: (!) 145/83 (!) 149/77 (!) 152/76 (!) 148/82  Pulse: 94 95 83 86  Resp: (!) 24 (!) 24 20 (!) 22  Temp:   99.3 F (37.4 C) 98.6 F (37 C)  TempSrc:   Oral Oral  SpO2: (!) 87% 91% 96% 94%    Intake/Output Summary (Last 24 hours) at 09/13/2018  1101 Last data filed at 09/13/2018 1057 Gross per 24 hour  Intake 1776.96 ml  Output 1925 ml  Net -148.04 ml   There were no vitals filed for this visit. Weight change:   There is no height or weight on file to calculate BMI.  Examination:  General exam: Somnolent, on bedside chair, follows some commands but lethargic.   HEENT:PERRLA,Oral mucosa moist, Ear/Nose normal on gross exam, normal dentition. Respiratory system: Bilateral equal air entry, normal vesicular breath sounds, no wheezes or crackles  Cardiovascular system: S1 & S2 heard,No JVD, murmurs.No pedal edema. Gastrointestinal system: Abdomen is  soft, non tender, non distended, BS +  Nervous System:  lethargic, able to move upper extremities and lower extremities  without any focal weakness.  Follows commands intermittently but not consistent.  Extremities: No edema, no clubbing,distal peripheral pulses palpable. Skin: No rashes, lesions,no icterus MSK: Normal muscle bulk,tone ,power  Data Reviewed: I have personally reviewed following labs and imaging studies  CBC: Recent Labs  Lab 09/12/18 0425 09/13/18 0144  WBC 11.4* 10.1  NEUTROABS 9.1*  --   HGB 13.1 11.8*  HCT 41.7 37.0*  MCV 91.2 88.5  PLT 202 199   Basic Metabolic Panel: Recent Labs  Lab 09/12/18 0425  NA 136  K 3.9  CL 100  CO2 25  GLUCOSE 160*  BUN 15  CREATININE 1.01  CALCIUM 8.7*   GFR: CrCl cannot be calculated (Unknown ideal weight.). Liver Function Tests: No results for input(s): AST, ALT, ALKPHOS, BILITOT, PROT, ALBUMIN in the last 168 hours. No results for input(s): LIPASE, AMYLASE in the last 168 hours. No results for input(s): AMMONIA in the last 168 hours. Coagulation Profile: No results for input(s): INR, PROTIME in the last 168 hours. Cardiac Enzymes: No results for input(s): CKTOTAL, CKMB, CKMBINDEX, TROPONINI in the last 168 hours. BNP (last 3 results) No results for input(s): PROBNP in the last 8760  hours. HbA1C: Recent Labs    09/13/18 0144  HGBA1C 7.4*   CBG: Recent Labs  Lab 09/12/18 1134 09/12/18 1329 09/12/18 1725 09/12/18 2056 09/13/18 0842  GLUCAP 276* 289* 222* 187* 129*   Lipid Profile: No results for input(s): CHOL, HDL, LDLCALC, TRIG, CHOLHDL, LDLDIRECT in the last 72 hours. Thyroid Function Tests: No results for input(s): TSH, T4TOTAL, FREET4, T3FREE, THYROIDAB in the last 72 hours. Anemia Panel: No results for input(s): VITAMINB12, FOLATE, FERRITIN, TIBC, IRON, RETICCTPCT in the last 72 hours. Sepsis Labs: Recent Labs  Lab 09/12/18 0552 09/12/18 0853  LATICACIDVEN 2.00* 1.14    Recent Results (from the past 240 hour(s))  Urine culture     Status: Abnormal   Collection Time: 09/12/18  6:13 AM  Result Value Ref Range Status   Specimen Description URINE, RANDOM  Final   Special Requests   Final    NONE Performed at [facility], [address] [place], [place] [address]    Culture (A)  Final    >=100,000 COLONIES/mL MULTIPLE SPECIES PRESENT, SUGGEST RECOLLECTION   Report Status 09/13/2018 FINAL  Final      Radiology Studies: Ct Head Wo Contrast  Result Date: 09/12/2018 CLINICAL DATA:  Acute unexplained confusion. EXAM: CT HEAD WITHOUT CONTRAST TECHNIQUE: Contiguous axial images were obtained from the  base of the skull through the vertex without intravenous contrast. COMPARISON:  08/03/2018 FINDINGS: Brain: Brainstem appears normal. Old stroke in the right cerebellum. Cerebral hemispheres show chronic small-vessel change of the hemispheric white matter. No sign of acute infarction, mass lesion, hemorrhage, hydrocephalus or extra-axial collection. Vascular: There is atherosclerotic calcification of the major vessels at the base of the brain. Skull: Negative Sinuses/Orbits: Clear/normal Other: None IMPRESSION: No acute finding by CT. 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 Chronic small-vessel ischemic changes of the cerebral hemispheric white matter.  Electronically Signed   By: Paulina Fusi M.D.   On: 09/12/2018 19:37   Dg Chest Port 1 View  Result Date: 09/12/2018 CLINICAL DATA:  Shortness of breath for a week, hypoxia. EXAM: PORTABLE CHEST 1 VIEW COMPARISON:  Chest radiograph March 03, 2018. FINDINGS: Cardiomediastinal silhouette is normal. Similar fullness of the hila. Persistently elevated RIGHT hemidiaphragm with bibasilar strandy densities. No pleural effusion or focal consolidation. Punctate granuloma LEFT lung base. Biapical pleural thickening. No pneumothorax. Soft tissue planes and included osseous structures are non suspicious. IMPRESSION: 1. Bibasilar atelectasis. Electronically Signed   By: Awilda Metro M.D.   On: 09/12/2018 04:50     Scheduled Meds: . atorvastatin  40 mg Oral QHS  . azithromycin  500 mg Oral Daily  . benazepril  10 mg Oral Daily  . brimonidine  1 drop Right Eye BID  . buPROPion  150 mg Oral Daily  . dipyridamole-aspirin  1 capsule Oral BID  . enoxaparin (LOVENOX) injection  40 mg Subcutaneous Q24H  . insulin aspart  0-9 Units Subcutaneous TID WC  . latanoprost  1 drop Right Eye QHS  . memantine  10 mg Oral BID  . mirabegron ER  25 mg Oral QHS  . multivitamin with minerals  1 tablet Oral Daily  . pantoprazole  40 mg Oral Daily  . QUEtiapine  12.5 mg Oral BID  . sertraline  100 mg Oral Daily  . tamsulosin  0.4 mg Oral QHS  . timolol  1 drop Right Eye BID   Continuous Infusions: . sodium chloride 100 mL/hr at 09/13/18 0959  . cefTRIAXone (ROCEPHIN)  IV Stopped (09/12/18 2330)     LOS: 0 days   Time spent: More than 50% of that time was spent in counseling and/or coordination of care.  Lanae Boast, MD [facility] Pager [phone]  If 7PM-7AM, please contact night-coverage [email or web address] Password [email or web address] 09/13/2018, 11:01 AM

## 2018-09-13 NOTE — [place] FL2 (Addendum)
[place] MEDICAID FL2 LEVEL OF CARE SCREENING TOOL     IDENTIFICATION  Patient Name: Daniel Morrow Birthdate: 01/22/1941 Sex: male Admission Date (Current Location): 09/12/2018  [facility] [place]   Facility and Address:  [facility], [address], [place], [place] [address]      Provider Number: [id number]  Attending Physician Name and Address:  Lanae BoastKc, Ramesh, MD  Relative Name and Phone Number:  Francisca Decemberrudy Kuchera, wife, [phone]    Current Level of Care: Hospital Recommended Level of Care: Skilled Nursing Facility Prior Approval Number:    Date Approved/Denied:  09/13/2018  Expires: 10/13/2018 PASRR Number: [id number]   Discharge Plan: SNF    Current Diagnoses: Patient Active Problem List   Diagnosis Date Noted  . Shortness of breath at rest 09/12/2018  . Shortness of breath 09/12/2018  . Vision, loss, sudden 06/03/2016  . CRAO (central retinal artery occlusion) 06/03/2016  . Alzheimer's disease (HCC) 01/16/2016  . Epididymo-orchitis 12/02/2013  . Orchitis and epididymitis 12/02/2013  . Mild cognitive impairment, so stated 02/17/2013  . CVA (cerebral infarction) 02/17/2013  . 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 Hypertension 04/03/2012  . Diabetes mellitus (HCC) 04/03/2012  . Hyperlipidemia     Orientation RESPIRATION BLADDER Height & Weight     Self  O2(Sp02 98, nasal cannula, Flow rate: 1.5) Incontinent(Has external catheter) Weight:   Height:     BEHAVIORAL SYMPTOMS/MOOD NEUROLOGICAL BOWEL NUTRITION STATUS      Continent Diet(Cardiac/carb modified, thin liquids)  AMBULATORY STATUS COMMUNICATION OF NEEDS Skin   Limited Assist   Bruising                       Personal Care Assistance Level of Assistance  Bathing, Feeding, Dressing, Total care Bathing Assistance:  Limited assistance Feeding assistance: Limited assistance Dressing Assistance: Limited assistance Total Care Assistance: Limited assistance   Functional Limitations Info  Sight, Hearing, Speech Sight Info: Adequate Hearing Info: Adequate Speech Info: Impaired(Has some delayed speech )    SPECIAL CARE FACTORS FREQUENCY  PT (By licensed PT), OT (By licensed OT)     PT Frequency: 5x/wk OT Frequency: 5x/wk            Contractures Contractures Info: Not present    Additional Factors Info  Code Status, Allergies, Insulin Sliding Scale, Psychotropic Code Status Info: Full Code Allergies Info: None Psychotropic Info: Zoloft 25mg  daily Insulin Sliding Scale Info: Enoxaparin (lovenox) injection 40mg  every 24 hours; Insulin aspart (novolog) injection 0-9 units 3x daily with meals       Current Medications (09/13/2018):  This is the current hospital active medication list Current Facility-Administered Medications  Medication Dose Route Frequency Provider Last Rate Last Dose  . albuterol (PROVENTIL) (2.5 MG/3ML) 0.083% nebulizer solution 2.5 mg  2.5 mg Nebulization Q2H PRN Kc, Ramesh, MD      . atorvastatin (LIPITOR) tablet 40 mg  40 mg Oral QHS Kc, Ramesh, MD   40 mg at 09/12/18 2122  . azithromycin (ZITHROMAX) tablet 500 mg  500 mg Oral Daily Kc, Ramesh, MD      . benazepril (LOTENSIN) tablet 10 mg  10 mg Oral Daily Kc, Ramesh, MD   10 mg at 09/13/18 0957  . brimonidine (ALPHAGAN) 0.2 % ophthalmic solution 1 drop  1 drop Right Eye BID Lanae BoastKc, Ramesh, MD   1 drop at 09/13/18  0957  . buPROPion (WELLBUTRIN XL) 24 hr tablet 150 mg  150 mg Oral Daily Kc, Ramesh, MD   150 mg at 09/13/18 0957  . cefTRIAXone (ROCEPHIN) 1 g in sodium chloride 0.9 % 100 mL IVPB  1 g Intravenous Q24H Lanae BoastKc, Ramesh, MD   Stopped at 09/12/18 2330  . dipyridamole-aspirin (AGGRENOX) 200-25 MG per 12 hr capsule 1 capsule  1 capsule Oral BID Kc, Ramesh, MD   1 capsule at 09/13/18 0957  . enoxaparin (LOVENOX) injection 40 mg   40 mg Subcutaneous Q24H Kc, Ramesh, MD   40 mg at 09/13/18 1333  . guaiFENesin tablet 200 mg  200 mg Oral Q4H PRN Kc, Ramesh, MD      . insulin aspart (novoLOG) injection 0-9 Units  0-9 Units Subcutaneous TID WC Lanae BoastKc, Ramesh, MD   2 Units at 09/13/18 1334  . latanoprost (XALATAN) 0.005 % ophthalmic solution 1 drop  1 drop Right Eye Grier RocherQHS Kc, Ramesh, MD   1 drop at 09/12/18 2123  . memantine (NAMENDA) tablet 10 mg  10 mg Oral BID Lanae BoastKc, Ramesh, MD   10 mg at 09/13/18 0957  . mirabegron ER (MYRBETRIQ) tablet 25 mg  25 mg Oral QHS Lanae BoastKc, Ramesh, MD   25 mg at 09/12/18 2124  . multivitamin with minerals tablet 1 tablet  1 tablet Oral Daily Lanae BoastKc, Ramesh, MD   1 tablet at 09/13/18 0957  . pantoprazole (PROTONIX) EC tablet 40 mg  40 mg Oral Daily Kc, Ramesh, MD   40 mg at 09/13/18 0957  . QUEtiapine (SEROQUEL) tablet 12.5 mg  12.5 mg Oral BID Kc, Ramesh, MD   12.5 mg at 09/13/18 0957  . sertraline (ZOLOFT) tablet 100 mg  100 mg Oral Daily Kc, Ramesh, MD   100 mg at 09/13/18 0957  . tamsulosin (FLOMAX) capsule 0.4 mg  0.4 mg Oral QHS Kc, Ramesh, MD   0.4 mg at 09/12/18 2125  . timolol (TIMOPTIC) 0.5 % ophthalmic solution 1 drop  1 drop Right Eye BID Lanae BoastKc, Ramesh, MD   1 drop at 09/13/18 0957     Discharge Medications: Please see discharge summary for a list of discharge medications.  Relevant Imaging Results:  Relevant Lab Results:   Additional Information SSN: [id number]  Nada BoozerCaitlin B Daliya Parchment, LCSWA

## 2018-09-13 NOTE — Progress Notes (Signed)
Inpatient Diabetes Program Recommendations  AACE/ADA: New Consensus Statement on Inpatient Glycemic Control (2019)  Target Ranges:  Prepandial:   less than 140 mg/dL      Peak postprandial:   less than 180 mg/dL (1-2 hours)      Critically ill patients:  140 - 180 mg/dL   Results for Daniel Morrow, Daniel Morrow (MRN [id number]) as of 09/13/2018 10:54  Ref. Range 09/12/2018 11:34 09/12/2018 13:29 09/12/2018 17:25 09/12/2018 20:56 09/13/2018 08:42  Glucose-Capillary Latest Ref Range: 70 - 99 mg/dL 409276 (H) 811289 (H) 914222 (H) 187 (H) 129 (H)  Results for Daniel Morrow, Daniel Morrow (MRN [id number]) as of 09/13/2018 10:54  Ref. Range 09/13/2018 01:44  Hemoglobin A1C Latest Ref Range: 4.8 - 5.6 % 7.4 (H)   Review of Glycemic Control  Diabetes history: DM2 Outpatient Diabetes medications: Amaryl 4 mg daily, Jardiance 25 mg daily, Ozempic 2 mg Qweek Current orders for Inpatient glycemic control: Novolog 0-9 units TID with meals  Inpatient Diabetes Program Recommendations:  Correction (SSI): Please consider ordering Novolog 0-5 units QHS for bedtime correction.  Insulin - Meal Coverage: Please consider ordering Novolog 3 units TID with meals for meal coverage if patient eats at least 50% of meals.  HgbA1C: A1C 7.4% on 09/13/18 indicating an average glucose of 166 mg/dl over the past 2-3 months.  Thanks, Orlando PennerMarie Tyreesha Maharaj, RN, MSN, CDE Diabetes Coordinator Inpatient Diabetes Program [phone] (Team Pager from 8am to 5pm)

## 2018-09-13 NOTE — Progress Notes (Addendum)
09/13/18: Update:  PASSAR received: [id number] [id number]  expiries: 10/13/2018  Drucilla Schmidtaitlin Annslee Tercero, MSW, LCSW-A Clinical Social Worker [facility]   PASSAR is currently under review. I submitted all of the paperwork. Awaiting a number.   Continue to monitor.   Drucilla Schmidtaitlin Lynzie Cliburn, MSW, LCSW-A Clinical Social Worker [facility]

## 2018-09-13 NOTE — Consult Note (Addendum)
Neurology Consultation  Reason for Consult: Altered mental status along with possible Parkinson's  Referring Physician: Dr. Dayna Barkeramesh  History is obtained from: Daughter and wife  HPI: Darene LamerBrantley L Evett is a 77 y.o. male history of stroke, hypertension, hyperlipidemia, glaucoma, diabetes and dementia.  Patient does see Dr. Meryl Daniel Morrow as an outpatient for his cognitive decline long with his stroke.  Reading his notes he believes that the cognitive decline and dementia is likely vascular dementia.  Neurology was consulted to see the patient as the family is very concerned that he may Parkinson's with the dementia and lately has had a significant decline.  Apparently he was doing well this Saturday but noticed a significant decline on Sunday and into today.  They have noticed that he has difficulty getting his words out and his sentences out but does find ways to express himself.  They noticed that he has difficulty getting up out of a chair, that he does shuffle, he does drool, and he has a tremor.  They are very frustrated this point time because they feel like they have been asking both Dr. Pearlean BrownieSethi and the primary care doctor about these issues and feels as though they are getting no feedback.  ROS:  Unable to obtain due to altered mental status.   Past Medical History:  Diagnosis Date  . Dementia (HCC)   . Diabetes mellitus   . Glaucoma   . Hyperlipidemia   . Hypertension   . Stroke [facility](HCC)    No family history on file.   Social History:   reports that he has quit smoking. He has never used smokeless tobacco. He reports that he does not drink alcohol or use drugs.  Medications  Current Facility-Administered Medications:  .  albuterol (PROVENTIL) (2.5 MG/3ML) 0.083% nebulizer solution 2.5 mg, 2.5 mg, Nebulization, Q2H PRN, Kc, Ramesh, MD .  atorvastatin (LIPITOR) tablet 40 mg, 40 mg, Oral, QHS, Kc, Ramesh, MD, 40 mg at 09/12/18 2122 .  azithromycin (ZITHROMAX) tablet 500 mg, 500 mg, Oral, Daily,  Kc, Ramesh, MD .  benazepril (LOTENSIN) tablet 10 mg, 10 mg, Oral, Daily, Kc, Ramesh, MD, 10 mg at 09/13/18 0957 .  brimonidine (ALPHAGAN) 0.2 % ophthalmic solution 1 drop, 1 drop, Right Eye, BID, Kc, Ramesh, MD, 1 drop at 09/13/18 0957 .  buPROPion (WELLBUTRIN XL) 24 hr tablet 150 mg, 150 mg, Oral, Daily, Kc, Ramesh, MD, 150 mg at 09/13/18 0957 .  cefTRIAXone (ROCEPHIN) 1 g in sodium chloride 0.9 % 100 mL IVPB, 1 g, Intravenous, Q24H, Kc, Ramesh, MD, Stopped at 09/12/18 2330 .  dipyridamole-aspirin (AGGRENOX) 200-25 MG per 12 hr capsule 1 capsule, 1 capsule, Oral, BID, Kc, Ramesh, MD, 1 capsule at 09/13/18 0957 .  enoxaparin (LOVENOX) injection 40 mg, 40 mg, Subcutaneous, Q24H, Kc, Ramesh, MD, 40 mg at 09/13/18 1333 .  guaiFENesin tablet 200 mg, 200 mg, Oral, Q4H PRN, Kc, Ramesh, MD .  insulin aspart (novoLOG) injection 0-9 Units, 0-9 Units, Subcutaneous, TID WC, Kc, Ramesh, MD, 2 Units at 09/13/18 1334 .  latanoprost (XALATAN) 0.005 % ophthalmic solution 1 drop, 1 drop, Right Eye, QHS, Kc, Ramesh, MD, 1 drop at 09/12/18 2123 .  memantine (NAMENDA) tablet 10 mg, 10 mg, Oral, BID, Kc, Ramesh, MD, 10 mg at 09/13/18 0957 .  mirabegron ER (MYRBETRIQ) tablet 25 mg, 25 mg, Oral, QHS, Kc, Ramesh, MD, 25 mg at 09/12/18 2124 .  multivitamin with minerals tablet 1 tablet, 1 tablet, Oral, Daily, Kc, Ramesh, MD, 1 tablet at 09/13/18 0957 .  pantoprazole (PROTONIX) EC tablet 40 mg, 40 mg, Oral, Daily, Kc, Ramesh, MD, 40 mg at 09/13/18 0957 .  QUEtiapine (SEROQUEL) tablet 12.5 mg, 12.5 mg, Oral, BID, Kc, Ramesh, MD, 12.5 mg at 09/13/18 0957 .  sertraline (ZOLOFT) tablet 100 mg, 100 mg, Oral, Daily, Kc, Ramesh, MD, 100 mg at 09/13/18 0957 .  tamsulosin (FLOMAX) capsule 0.4 mg, 0.4 mg, Oral, QHS, Kc, Ramesh, MD, 0.4 mg at 09/12/18 2125 .  timolol (TIMOPTIC) 0.5 % ophthalmic solution 1 drop, 1 drop, Right Eye, BID, Kc, Ramesh, MD, 1 drop at 09/13/18 0957   Exam: Current vital signs: BP (!) 149/83 (BP Location:  Right Arm)   Pulse 89   Temp 98.4 F (36.9 C) (Oral)   Resp (!) 21   SpO2 98%  Vital signs in last 24 hours: Temp:  [98.4 F (36.9 C)-99.3 F (37.4 C)] 98.4 F (36.9 C) (12/23 1404) Pulse Rate:  [83-89] 89 (12/23 1404) Resp:  [20-22] 21 (12/23 1404) BP: (148-152)/(76-83) 149/83 (12/23 1404) SpO2:  [94 %-98 %] 98 % (12/23 1404)  Physical Exam  Constitutional: Appears well-developed and well-nourished.  Psych: Affect appropriate to situation Eyes: No scleral injection HENT: No OP obstrucion Head: Normocephalic.  Cardiovascular: Normal rate and regular rhythm.  Respiratory: Effort normal, non-labored breathing GI: Soft.  No distension. There is no tenderness.  Skin: WDI  Neuro: Mental Status: Patient is awake and alert. Not oriented to person, place year month.  Patient is unable to give a clear history.  Patient is able to follow commands.  Patient is not aphasic but is significantly bradyphrenic, with increased latencies of verbal and motor responses.  Cranial Nerves: II: No visual field cut.  III,IV, VI: EOMI without ptosis or diplopia. Pupils are equal, round, and reactive to light. V: Facial sensation is symmetric to temperature VII: Facial movement is symmetric.-Left facial droop noted. Also noted is hypomimia.  VIII: hearing is intact to voice XI: Shoulder shrug is symmetric. XII: tongue is midline without atrophy or fasciculations.  Motor: Tone is normal. Bulk is normal.  4/5 strength in all extremities with significant bradykinesia.  There is mildly increased tone and mild cogwheeling in bilateral arms. Sensory: Sensation is symmetric to light touch and temperature in the arms and legs Deep Tendon Reflexes: 2+ and symmetric in the biceps and patellae.  Plantars: Toes are downgoing bilaterally.  Cerebellar: Finger-nose is without ataxia bilaterally, but again with significant bradykinesia.  He has difficulty with tapping his foot due to bradykinesia. Gait: When  trying to stand he cannot get up without using his arms.  He does have a shuffling gait.     Labs I have reviewed labs in epic and the results pertinent to this consultation are:   CBC    Component Value Date/Time   WBC 10.1 09/13/2018 0144   RBC 4.18 (L) 09/13/2018 0144   HGB 11.8 (L) 09/13/2018 0144   HCT 37.0 (L) 09/13/2018 0144   PLT 199 09/13/2018 0144   MCV 88.5 09/13/2018 0144   MCH 28.2 09/13/2018 0144   MCHC 31.9 09/13/2018 0144   RDW 13.5 09/13/2018 0144   LYMPHSABS 1.0 09/12/2018 0425   MONOABS 1.0 09/12/2018 0425   EOSABS 0.2 09/12/2018 0425   BASOSABS 0.0 09/12/2018 0425    CMP     Component Value Date/Time   NA 136 09/12/2018 0425   NA 144 06/03/2016 1240   K 3.9 09/12/2018 0425   CL 100 09/12/2018 0425   CO2 25 09/12/2018 0425  GLUCOSE 160 (H) 09/12/2018 0425   BUN 15 09/12/2018 0425   BUN 18 06/03/2016 1240   CREATININE 1.01 09/12/2018 0425   CALCIUM 8.7 (L) 09/12/2018 0425   PROT 6.4 (L) 03/03/2018 1651   ALBUMIN 3.5 03/03/2018 1651   AST 19 03/03/2018 1651   ALT 18 03/03/2018 1651   ALKPHOS 89 03/03/2018 1651   BILITOT 1.0 03/03/2018 1651   GFRNONAA >60 09/12/2018 0425   GFRAA >60 09/12/2018 0425    Lipid Panel     Component Value Date/Time   CHOL 154 04/03/2012 0600   TRIG 140 04/03/2012 0600   HDL 40 04/03/2012 0600   CHOLHDL 3.9 04/03/2012 0600   VLDL 28 04/03/2012 0600   LDLCALC 86 04/03/2012 0600     Imaging I have reviewed the images obtained:  CT-scan of the brain-no acute findings by CT.  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  Chronic small vessel ischemic changes of the cerebellar hemisphere with hemispheric white matter.   Felicie MornDavid Smith PA-C [facility] [phone] 09/13/2018, 4:21 PM     Assessment: 77 year old male with a history of dementia. Neurology consulted for possible idiopathic Parkinson's disease versus vascular Parkinsonism or a Parkinson's plus syndrome.  1. At this point is difficult to figure out if  this patient has idiopathic Parkinson's disease, vascular Parkinson's or a Parkinson's plus syndrome.   2. Neurology team had a lengthy discussion greater than half an hour with the patient's family. Recommendations include scheduling an appointment with a movement disorder specialist such as Dr. Arbutus Leasat. Also discussed was a trial of Sinemet, which is reasonable while in the hospital. 3. Per report, the impression of the patient's outpatient physician, Dr. Dayna Barkeramesh, is that his cognitive decline is likely due to vascular dementia. Parkinson's disease and other Parkinsonian syndromes may also be associated with dementia.  4. Vitamin B12 level is borderline low by neurological standards. May benefit from supplementation.   Recommendations: 1. Start vitamin B12 oral supplementation at 2 mg po qd 2. Consider starting a trial of Aricept at 5 mg po qhs 3. Trial of Sinemet 25/100 TID, first dose at 10 AM (ordered). Monitor for possible improvement. PT input may be helpful for this.   I have interviewed and examined the patient. I have formulated the assessment and plan.  Electronically signed: Dr. Caryl PinaEric Kimorah Ridolfi

## 2018-09-13 NOTE — Clinical Social Work Note (Signed)
Clinical Social Work Assessment  Patient Details  Name: Daniel Morrow [place] MRN: [id number] Date of Birth: 12/02/1940  Date of referral:  09/13/18               Reason for consult:  Discharge Planning                Permission sought to share information with:  Case Manager, Facility Medical sales representativeContact Representative, Family Supports Permission granted to share information::  Yes, Verbal Permission Granted  Name::     Air cabin crewTrudy  Agency::  [facility]NF[facility]  Relationship::  spouse  Contact Information:  [phone]  Housing/Transportation Living arrangements for the past 2 months:  Single Family Home Source of Information:  Patient Patient Interpreter Needed:  None Criminal Activity/Legal Involvement Pertinent to Current Situation/Hospitalization:  No - Comment as needed Significant Relationships:  Spouse, Adult Children Lives with:  Spouse Do you feel safe going back to the place where you live?  No Need for family participation in patient care:  Yes (Comment)  Care giving concerns:  CSW received referral for possible SNF placement at time of discharge. Spoke with patient's wife Damian Leavellrudy regarding possibility of SNF placement as patient is disoriented X3. Patient's  Wife is currently unable to care for her at their home given patient's current needs and fall risk.  Patient's wife expressed understanding of PT recommendation and are agreeable to SNF placement at time of discharge. CSW to continue to follow and assist with discharge planning needs.     Social Worker assessment / plan:  Spoke with patient's spouse Damian Leavellrudy [facility].    Employment status:  Retired Database administratornsurance information:  Managed Medicare PT Recommendations:  Skilled Nursing Facility Information / Referral to community resources:  Skilled Nursing Facility  Patient/Family's Response to care:  Patient's family   recognizes need for rehab before returning home and are agreeable to a SNF and report  preference for [facility] or [facility]. CSW explained insurance authorization process. Patient's family reported that they want patient to get stronger to be able to come back home.    Patient/Family's Understanding of and Emotional Response to Diagnosis, Current Treatment, and Prognosis:  Patient/family is realistic regarding therapy needs and expressed being hopeful for SNF placement. Patient expressed understanding of CSW role and discharge process as well as medical condition. No questions/concerns about plan or treatment.    Emotional Assessment Appearance:  Appears stated age Attitude/Demeanor/Rapport:  Unable to Assess Affect (typically observed):  Unable to Assess Orientation:  Oriented to Self Alcohol / Substance use:  Not Applicable Psych involvement (Current and /or in the community):  No (Comment)  Discharge Needs  Concerns to be addressed:  Discharge Planning Concerns Readmission within the last 30 days:  No Current discharge risk:  Dependent with Mobility Barriers to Discharge:  Continued Medical Work up   Dynegyshley M Samamtha Tiegs, LCSW 09/13/2018, 12:18 PM

## 2018-09-13 NOTE — Evaluation (Signed)
Physical Therapy Evaluation Patient Details Name: Daniel Morrow MRN: [id number] DOB: 03/21/1941 Today's Date: 09/13/2018   History of Present Illness  77 y.o. male with medical history significant for Diabetes mellitus, hyperlipidemia, hypertension, history of stroke in 2013, brought to the ER for evaluation of worsening cough, worsening work of breathing, slowed response.  CT reveals old infarct and no acute event.    Clinical Impression  Pt unable to participate in interview answering "yeah" to almost all questioning. After session, pt's wife reached by phone and reports PTA pt independent in limited community ambulation without AD, independent with ADLs assist required with iADLs. Pt has history of dementia, but has had decline in mentation and mobility since Saturday. Pt currently limited in safe mobility by decreased cognition and decreased balance. Pt OOB in recliner on entry and requires modA for sit>stand from recliner. Pt unable to step away from recliner due to increased posterior lean and bracing of LE on recliner to maintain balance. PT recommending SNF level rehab at d/c due to decreased mobility. PT will continue to follow acutely.     Follow Up Recommendations SNF    Equipment Recommendations  None recommended by PT    Recommendations for Other Services       Precautions / Restrictions Precautions Precautions: Fall Precaution Comments: hx of falling,possible unwitnessed fall prior to hospitalization Restrictions Weight Bearing Restrictions: No      Mobility  Bed Mobility               General bed mobility comments: up in chair on entry, mobility tech reports assist of 1 for bed mobility and transfer to chair  Transfers Overall transfer level: Needs assistance Equipment used: Rolling walker (2 wheeled) Transfers: Sit to/from Stand Sit to Stand: Mod assist         General transfer comment: attempted to use RW to stand requires 3 attempts and modA of  therapist, once in standing pt with increased posterior lean and bracing of LE on the recliner for steadying, unable to take steps away from chair, when asked about dizziness pt report "yeah"     Balance Overall balance assessment: Needs assistance Sitting-balance support: Feet supported;No upper extremity supported Sitting balance-Leahy Scale: Fair     Standing balance support: Bilateral upper extremity supported(bracing of LE on recliner to maintain balance) Standing balance-Leahy Scale: Poor                               Pertinent Vitals/Pain Pain Assessment: Faces Faces Pain Scale: No hurt    Home Living Family/patient expects to be discharged to:: Private residence Living Arrangements: Spouse/significant other Available Help at Discharge: Family;Available 24 hours/day Type of Home: House Home Access: Stairs to enter   Entergy CorporationEntrance Stairs-Number of Steps: 4 Home Layout: One level;Other (Comment)(1 step den to dining room, site of at least one fall) Home Equipment: None      Prior Function Level of Independence: Needs assistance   Gait / Transfers Assistance Needed: ambulates limited community distances without AD  ADL's / Homemaking Assistance Needed: requires assist for iADLs           Extremity/Trunk Assessment   Upper Extremity Assessment Upper Extremity Assessment: Difficult to assess due to impaired cognition    Lower Extremity Assessment Lower Extremity Assessment: Difficult to assess due to impaired cognition(Pt noted to have occassional whole body tremor with movement)       Communication   [facility]  Cognition Arousal/Alertness: Lethargic Behavior During Therapy: Flat affect Overall Cognitive Status: History of cognitive impairments - at baseline Area of Impairment: Orientation;Attention;Memory;Following commands;Safety/judgement;Awareness;Problem solving                 Orientation Level: Disoriented  to;Time;Situation Current Attention Level: Selective Memory: Decreased short-term memory Following Commands: Follows one step commands inconsistently Safety/Judgement: Decreased awareness of safety Awareness: Emergent Problem Solving: Slow processing;Decreased initiation;Difficulty sequencing;Requires verbal cues;Requires tactile cues General Comments: pt somewhat lethargic, with difficulty providing PLOF and Living Situation, answers "yeah" to most questions despite what is being asked      General Comments General comments (skin integrity, edema, etc.): Pt wife called after session to collect PLOF and Home set up. Wife reports she left for about 3 hours on Saturday and when she came back pt was "changed", Wife feels he may have experienced mechanical fall during the time she was gone however pt does not exhibit any grimacing or guarding with movement. Wife reports history of falling and pt currently working with HHPT to improve balance due to multiple falls. Pt on 2L O2 via [place] on entry, SaO2 92%O2     Assessment/Plan    PT Assessment Patient needs continued PT services  PT Problem List Decreased activity tolerance;Decreased balance;Decreased mobility;Decreased safety awareness;Decreased cognition       PT Treatment Interventions DME instruction;Gait training;Stair training;Functional mobility training;Therapeutic activities;Therapeutic exercise;Balance training;Cognitive remediation;Patient/family education    PT Goals (Current goals can be found in the Care Plan section)  Acute Rehab PT Goals Patient Stated Goal: none stated PT Goal Formulation: Patient unable to participate in goal setting Time For Goal Achievement: 09/27/18    Frequency Min 2X/week    AM-PAC PT "6 Clicks" Mobility  Outcome Measure Help needed turning from your back to your side while in a flat bed without using bedrails?: A Little Help needed moving from lying on your back to sitting on the side of a flat bed  without using bedrails?: A Little Help needed moving to and from a bed to a chair (including a wheelchair)?: A Lot Help needed standing up from a chair using your arms (e.g., wheelchair or bedside chair)?: A Lot Help needed to walk in hospital room?: Total Help needed climbing 3-5 steps with a railing? : Total 6 Click Score: 12    End of Session Equipment Utilized During Treatment: Gait belt;Oxygen Activity Tolerance: Patient limited by lethargy Patient left: in chair;with call bell/phone within reach;with chair alarm set;with nursing/sitter in room Nurse Communication: Mobility status PT Visit Diagnosis: Unsteadiness on feet (R26.81);Other abnormalities of gait and mobility (R26.89);Muscle weakness (generalized) (M62.81);Difficulty in walking, not elsewhere classified (R26.2);History of falling (Z91.81)    Time: [id number] PT Time Calculation (min) (ACUTE ONLY): 21 min   Charges:   PT Evaluation $PT Eval Moderate Complexity: 1 Mod          Harlee Pursifull B. Beverely RisenVan Fleet PT, DPT Acute Rehabilitation Services Pager [phone] Office [phone]   Elon Alaslizabeth B Van Fleet 09/13/2018, 10:27 AM

## 2018-09-14 ENCOUNTER — Encounter (HOSPITAL_COMMUNITY): Payer: Self-pay | Admitting: General Practice

## 2018-09-14 ENCOUNTER — Other Ambulatory Visit: Payer: Self-pay

## 2018-09-14 DIAGNOSIS — R0602 Shortness of breath: Secondary | ICD-10-CM | POA: Diagnosis not present

## 2018-09-14 LAB — GLUCOSE, CAPILLARY
Glucose-Capillary: 129 mg/dL — ABNORMAL HIGH (ref 70–99)
Glucose-Capillary: 184 mg/dL — ABNORMAL HIGH (ref 70–99)

## 2018-09-14 MED ORDER — CARBIDOPA-LEVODOPA 25-100 MG PO TABS: 1.0000 | ORAL_TABLET | Freq: Three times a day (TID) | ORAL | 0 refills | Status: DC

## 2018-09-14 MED ORDER — AZITHROMYCIN 500 MG PO TABS: ORAL_TABLET | ORAL | 0 refills | Status: DC

## 2018-09-14 MED ORDER — VITAMIN B-12 1000 MCG PO TABS: 1000.0000 ug | ORAL_TABLET | Freq: Every day | ORAL | Status: DC

## 2018-09-14 MED ORDER — QUETIAPINE FUMARATE 25 MG PO TABS: 12.5000 mg | ORAL_TABLET | Freq: Every evening | ORAL | 7 refills | Status: DC | PRN

## 2018-09-14 MED ORDER — CYANOCOBALAMIN 1000 MCG PO TABS: 1000.0000 ug | ORAL_TABLET | Freq: Every day | ORAL | 0 refills | Status: AC

## 2018-09-14 MED ORDER — QUETIAPINE 12.5 MG HALF TABLET
12.5000 mg | ORAL_TABLET | Freq: Every day | ORAL | Status: DC
  Filled 2018-09-14: qty 1

## 2018-09-14 MED ORDER — CARBIDOPA-LEVODOPA 25-100 MG PO TABS
1.0000 | ORAL_TABLET | Freq: Three times a day (TID) | ORAL | Status: DC
  Administered 2018-09-14 (×2): 1 via ORAL
  Filled 2018-09-14 (×3): qty 1

## 2018-09-14 MED ORDER — QUETIAPINE FUMARATE 25 MG PO TABS: 12.5000 mg | ORAL_TABLET | Freq: Every evening | ORAL | 0 refills | Status: DC | PRN

## 2018-09-14 MED FILL — QUETIAPINE FUMARATE 25 MG T: 25 | 4 days supply | Qty: 4 | Fill #0

## 2018-09-14 MED FILL — AZITHROMYCIN 500 MG TABLET: 500 | 3 days supply | Qty: 3 | Fill #0

## 2018-09-14 NOTE — Progress Notes (Signed)
Physical Therapy Treatment Patient Details Name: Daniel Morrow L Mcmahen MRN: [id number] DOB: 01/28/1941 Today's Date: 09/14/2018    History of Present Illness 77 y.o. male with medical history significant for Diabetes mellitus, hyperlipidemia, hypertension, history of stroke in 2013, brought to the ER for evaluation of worsening cough, worsening work of breathing, slowed response.  CT reveals old infarct and no acute event.      PT Comments    Pt ambulated 150' with RW and min A. Pt doing much better with transfers today but is still going to need 24 hr supervision due to scissoring of LE's with walking and pt's STM issues he will not remember to correct this issue. Spoke with pt's wife at length about this and she decided that she wants to take him home. She is aware that she will have to have someone with him anytime she needs to leave the house. Changing rec to HHPT. PT will continue to follow.    Follow Up Recommendations  Home health PT;Supervision/Assistance - 24 hour     Equipment Recommendations  None recommended by PT    Recommendations for Other Services       Precautions / Restrictions Precautions Precautions: Fall Precaution Comments: hx of falling,possible unwitnessed fall prior to hospitalization Restrictions Weight Bearing Restrictions: No    Mobility  Bed Mobility               General bed mobility comments: up in chair  Transfers Overall transfer level: Needs assistance Equipment used: Rolling walker (2 wheeled) Transfers: Sit to/from Stand Sit to Stand: Min guard         General transfer comment: pt cued to try to get up on his own, vc's for hand placement, pt able to rise with close guarding but no physical assist  Ambulation/Gait Ambulation/Gait assistance: Min assist Gait Distance (Feet): 150 Feet Assistive device: Rolling walker (2 wheeled) Gait Pattern/deviations: Step-through pattern;Narrow base of support;Scissoring Gait velocity:  decreased Gait velocity interpretation: <1.8 ft/sec, indicate of risk for recurrent falls General Gait Details: pt often scissors LE's and let's LE's get out of RW with turning. When cued, pt corrects this but forgets within a few minutes and needs cueing again   Stairs             Wheelchair Mobility    Modified Rankin (Stroke Patients Only)       Balance Overall balance assessment: Needs assistance Sitting-balance support: Feet supported;No upper extremity supported Sitting balance-Leahy Scale: Fair     Standing balance support: Bilateral upper extremity supported Standing balance-Leahy Scale: Poor Standing balance comment: unsafe without UE support                            Cognition Arousal/Alertness: Awake/alert Behavior During Therapy: Flat affect Overall Cognitive Status: History of cognitive impairments - at baseline Area of Impairment: Orientation;Attention;Memory;Following commands;Safety/judgement;Awareness;Problem solving                 Orientation Level: Disoriented to;Time;Situation Current Attention Level: Selective Memory: Decreased short-term memory Following Commands: Follows one step commands inconsistently;Follows one step commands with increased time Safety/Judgement: Decreased awareness of safety Awareness: Emergent Problem Solving: Slow processing;Decreased initiation;Difficulty sequencing;Requires verbal cues;Requires tactile cues General Comments: pt alert today and following commands but forgets instruction within 2-3 minutes and command has to be given again for safety      Exercises      General Comments General comments (skin integrity, edema, etc.): spoke at  length with wife about the need for 24/7 supervision if pt goes home and she decided that she could give that and wants to take pt home      Pertinent Vitals/Pain Pain Assessment: No/denies pain Faces Pain Scale: No hurt         SpO2 95% on RA  Home  Living Family/patient expects to be discharged to:: Unsure Living Arrangements: Spouse/significant other                  Prior Function            PT Goals (current goals can now be found in the care plan section) Acute Rehab PT Goals Patient Stated Goal: go home PT Goal Formulation: With patient/family Time For Goal Achievement: 09/27/18 Progress towards PT goals: Progressing toward goals    Frequency    Min 2X/week      PT Plan Discharge plan needs to be updated    Co-evaluation              AM-PAC PT "6 Clicks" Mobility   Outcome Measure  Help needed turning from your back to your side while in a flat bed without using bedrails?: A Little Help needed moving from lying on your back to sitting on the side of a flat bed without using bedrails?: A Little Help needed moving to and from a bed to a chair (including a wheelchair)?: A Little Help needed standing up from a chair using your arms (e.g., wheelchair or bedside chair)?: A Little Help needed to walk in hospital room?: A Little Help needed climbing 3-5 steps with a railing? : A Lot 6 Click Score: 17    End of Session Equipment Utilized During Treatment: Gait belt Activity Tolerance: Patient tolerated treatment well Patient left: in chair;with nursing/sitter in room;with call bell/phone within reach;with family/visitor present Nurse Communication: Mobility status PT Visit Diagnosis: Unsteadiness on feet (R26.81);Other abnormalities of gait and mobility (R26.89);Muscle weakness (generalized) (M62.81);Difficulty in walking, not elsewhere classified (R26.2);History of falling (Z91.81)     Time: [id number] PT Time Calculation (min) (ACUTE ONLY): 28 min  Charges:  $Gait Training: 23-37 mins                     Lyanne CoVictoria Kenzington Mielke, PT  Acute Rehab Services  Pager [phone] Office [phone]    Lawana ChambersVictoria L Zuhair Lariccia 09/14/2018, 4:04 PM

## 2018-09-14 NOTE — Progress Notes (Signed)
Pt to home accompanied by wife who will take care of him.  Pt has meds, DC instructions and wife understands follow upcare.

## 2018-09-14 NOTE — Progress Notes (Signed)
Transitions of care was able to fill Rxs for pt so he could go home.

## 2018-09-14 NOTE — Progress Notes (Signed)
Spoke with Dr. Dayna Barkeramesh about safety sitter, we will await PT evaluation and see if sitter needed.  Pt is a high fall risk.

## 2018-09-14 NOTE — Care Management Note (Addendum)
Case Management Note  Patient Details  Name: Darene LamerBrantley L Haffey MRN: [id number] Date of Birth: 02/08/1941  Subjective/Objective:                    Action/Plan:  Spoke to patient and wife at bedside. Wife has decided to take patient home with home health. She states he is active with [facility]. NCM called Dara Hoyeriffany Watson with [facility] , she checking on what services he is active with and will return call.  Tiffany with [facility] confirmed patient active with home health PT and speech . Since patient observation he does not need orders. Expected Discharge Date:                  Expected Discharge Plan:  Home w Home Health Services  In-House Referral:     Discharge planning Services  CM Consult  Post Acute Care Choice:    Choice offered to:  Patient, Spouse  DME Arranged:  N/A DME Agency:  NA  HH Arranged:    HH Agency[facility]  [facility]  Status of Service:  In process, will continue to follow  If discussed at Long Length of Stay Meetings, dates discussed:    Additional Comments:  Kingsley PlanWile, Caylea Foronda Marie, RN 09/14/2018, 4:08 PM

## 2018-09-14 NOTE — Discharge Summary (Signed)
Physician Discharge Summary  Darene LamerBrantley L Wurzel QIH:[id number] DOB: 09/18/1941 DOA: 09/12/2018  PCP: Kirby FunkGriffin, John, MD  Admit date: 09/12/2018 Discharge date: 09/14/2018  Admitted From: home Disposition: Home w home health  Recommendations for Outpatient Follow-up:  1. Follow up with PCP in 1-2 weeks 2. Please obtain BMP/CBC in one week 3. Please follow up on the following pending results:  Home Health: yes Equipment/Devices: none  Discharge Condition: Stable CODE STATUS:Full Diet recommendation:Carb Modified.   Brief/Interim Summary: 77 y.o.malewith medical history significant forDiabetes mellitus, hyperlipidemia, hypertension, history of stroke in 2013, mild dementia, resting tremorbrought to the ER for evaluation of worsening cough, worsening work of breathing. Patient is having "horrible cough" for the last 2 to 3 days PTA, shewas out 12/22 am for a few hours when she came back to see,  garage door was open and patient was having worsening cough, sore throat, barely talking and his eye was red so brought to the ER for evaluation.Wife thinks he may have goneoutside in the coldandsymptomsmay have gottenworse. Patient has had a fall recently, has home health services.   ED Course:Vital signs stable intermittently tachypneic up to 25, T-max 99.5.Routine lab with mild leukocytosis 11,400, mildly elevated lactate of 2.0 on subsequent recheck 1.1, troponin 0.0.Chest x-ray showed bibasilar atelectasis. Patient was suspected to have possible early pneumonia/bronchitis, was given ceftriaxone azithromycin and admission was requested He was admitted for further evaluation today empiric antibiotics suspect bronchitis no obvious pneumonia.  Patient had agitation and subsequently was lethargic.  Seen by neurology placed and signed a med suspecting Parkinson's disease.  Patient is advised to follow-up with primary neurologist.  Today she is Rest of the issues are stable and he will continue  on his home medication. Assessment & Plan:   Shortness of breath and cough:No obvious pneumonia on a chest x-ray however symptoms present for 2 to 3 days, with mild leukocytosis, ?bronchitis vs early pneumonia.  Leukocytosis improved , afebrile. Cont po azithromycin.   Agitation, with baseline cognitive dysfunction/dementia he has been going downhill since his stroke in 2013.  He may have combination of delirium in the setting of acute illness/hospitalization-mental status has now improved.  Suspect possible Parkinson's disease seen by neurology placed on Sinemet and cont on discahrge.TSH, ammonia level fairly stable.  Has borderline B12 will continue on oral supplementation.  Patient placed on low-dose Seroquel and can use prn nightly. Seen by PT. Patient's wife would like to take the patient home.  Patient is being discharged home with family.  Diabetes mellitus: Controlled.  hba1c is at 7.4. Cont  home regimen  Hyperlipidemia: cont statins  Hypertension:Blood pressure acceptable.  History of stroke in 2013and mentationslowed since then, followed by neurologist, on Namenda.   Recent multiple fallsat home as per the wife. Seen by physical therapy and he will be sent home with family.   Discharge Instructions  Discharge Instructions    Call MD for:   Complete by:  As directed    Recurrent similar symptoms, agitation, nausea vomiting fever chills   Diet Carb Modified   Complete by:  As directed    Face-to-face encounter (required for Medicare/Medicaid patients)   Complete by:  As directed    I Lanae Boastamesh Estera Ozier certify that this patient is under my care and that I, or a nurse practitioner or physician's assistant working with me, had a face-to-face encounter that meets the physician face-to-face encounter requirements with this patient on 09/14/2018. The encounter with the patient was in whole, or in part for  the following medical condition(s) which is the primary reason for home health  care (List medical condition): Deconditioning, dementia, difficulty with ambulation.   The encounter with the patient was in whole, or in part, for the following medical condition, which is the primary reason for home health care:  Deconditioning   I certify that, based on my findings, the following services are medically necessary home health services:   Physical therapy Speech language pathology     Reason for Medically Necessary Home Health Services:  Therapy- Investment banker, operationalGait Training, Patent examinerTransfer Training and Stair Training   My clinical findings support the need for the above services:  Cognitive impairments, dementia, or mental confusion  that make it unsafe to leave home   Further, I certify that my clinical findings support that this patient is homebound due to:  Unsafe ambulation due to balance issues   Home Health   Complete by:  As directed    To provide the following care/treatments:  PT   Increase activity slowly   Complete by:  As directed      Allergies as of 09/14/2018   No Known Allergies     Medication List    TAKE these medications   atorvastatin 40 MG tablet Commonly known as:  LIPITOR Take 40 mg by mouth at bedtime.   azithromycin 500 MG tablet Commonly known as:  ZITHROMAX Take 1 tablet oral daily.   benazepril 10 MG tablet Commonly known as:  LOTENSIN Take 10 mg by mouth daily.   brimonidine 0.2 % ophthalmic solution Commonly known as:  ALPHAGAN Place 1 drop into the right eye 2 (two) times daily.   buPROPion 150 MG 24 hr tablet Commonly known as:  WELLBUTRIN XL Take 150 mg by mouth daily.   carbidopa-levodopa 25-100 MG tablet Commonly known as:  SINEMET IR Take 1 tablet by mouth 3 (three) times daily.   cyanocobalamin 1000 MCG tablet Take 1 tablet (1,000 mcg total) by mouth daily. Start taking on:  September 15, 2018   dipyridamole-aspirin 200-25 MG 12hr capsule Commonly known as:  AGGRENOX Take 1 capsule by mouth 2 (two) times daily.   glimepiride 4 MG  tablet Commonly known as:  AMARYL Take 4 mg by mouth daily.   JARDIANCE 25 MG Tabs tablet Generic drug:  empagliflozin Take 25 mg by mouth daily.   latanoprost 0.005 % ophthalmic solution Commonly known as:  XALATAN Place 1 drop into the right eye at bedtime.   memantine 10 MG tablet Commonly known as:  NAMENDA Take 1 tablet (10 mg total) by mouth 2 (two) times daily.   multivitamin with minerals Tabs tablet Take 1 tablet by mouth daily.   MYRBETRIQ 25 MG Tb24 tablet Generic drug:  mirabegron ER Take 25 mg by mouth at bedtime.   OZEMPIC (1 MG/DOSE) 2 MG/1.5ML Sopn Generic drug:  Semaglutide (1 MG/DOSE) Inject 2 mg into the skin every Monday.   pantoprazole 40 MG tablet Commonly known as:  PROTONIX Take 40 mg by mouth daily.   QUEtiapine 25 MG tablet Commonly known as:  SEROQUEL Take 0.5 tablets (12.5 mg total) by mouth at bedtime as needed for up to 8 doses (agitation).   sertraline 100 MG tablet Commonly known as:  ZOLOFT Take 1 tablet (100 mg total) by mouth daily.   tamsulosin 0.4 MG Caps capsule Commonly known as:  FLOMAX Take 0.4 mg by mouth at bedtime.   timolol 0.5 % ophthalmic solution Commonly known as:  TIMOPTIC Place 1 drop into the right  eye 2 (two) times daily.      Follow-up Information    [facility] up.   Specialty:  Home Health Services Why:  Home health  Contact information: [address] [place] [address] [phone]          No Known Allergies  Consultations:  Neurology   Procedures/Studies: Ct Head Wo Contrast  Result Date: 09/12/2018 CLINICAL DATA:  Acute unexplained confusion. EXAM: CT HEAD WITHOUT CONTRAST TECHNIQUE: Contiguous axial images were obtained from the base of the skull through the vertex without intravenous contrast. COMPARISON:  08/03/2018 FINDINGS: Brain: Brainstem appears normal. Old stroke in the right cerebellum. Cerebral hemispheres show chronic small-vessel change of the  hemispheric white matter. No sign of acute infarction, mass lesion, hemorrhage, hydrocephalus or extra-axial collection. Vascular: There is atherosclerotic calcification of the major vessels at the base of the brain. Skull: Negative Sinuses/Orbits: Clear/normal Other: None IMPRESSION: No acute finding by CT. 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 Chronic small-vessel ischemic changes of the cerebral hemispheric white matter. Electronically Signed   By: Paulina Fusi M.D.   On: 09/12/2018 19:37   Dg Chest Port 1 View  Result Date: 09/12/2018 CLINICAL DATA:  Shortness of breath for a week, hypoxia. EXAM: PORTABLE CHEST 1 VIEW COMPARISON:  Chest radiograph March 03, 2018. FINDINGS: Cardiomediastinal silhouette is normal. Similar fullness of the hila. Persistently elevated RIGHT hemidiaphragm with bibasilar strandy densities. No pleural effusion or focal consolidation. Punctate granuloma LEFT lung base. Biapical pleural thickening. No pneumothorax. Soft tissue planes and included osseous structures are non suspicious. IMPRESSION: 1. Bibasilar atelectasis. Electronically Signed   By: Awilda Metro M.D.   On: 09/12/2018 04:50    Discharge Exam: Vitals:   09/14/18 0621 09/14/18 1417  BP: (!) 155/72 (!) 150/76  Pulse: 70 74  Resp:  20  Temp: 97.8 F (36.6 C) 98.4 F (36.9 C)  SpO2: 94% 97%   Vitals:   09/13/18 2328 09/14/18 0158 09/14/18 0621 09/14/18 1417  BP:   (!) 155/72 (!) 150/76  Pulse:   70 74  Resp:    20  Temp:   97.8 F (36.6 C) 98.4 F (36.9 C)  TempSrc:   Oral Oral  SpO2:   94% 97%  Weight:  88.5 kg    Height: 5\' 10"  (1.778 m)       General: Pt is alert, awake, oriented to self, place, family, not in acute distress Cardiovascular: RRR, S1/S2 +, no rubs, no gallops Respiratory: CTA bilaterally, no wheezing, no rhonchi Abdominal: Soft, NT, ND, bowel sounds + Extremities: no edema, no cyanosis   The results of significant diagnostics from this hospitalization (including imaging,  microbiology, ancillary and laboratory) are listed below for reference.     Microbiology: Recent Results (from the past 240 hour(s))  Urine culture     Status: Abnormal   Collection Time: 09/12/18  6:13 AM  Result Value Ref Range Status   Specimen Description URINE, RANDOM  Final   Special Requests   Final    NONE Performed at [facility], [address], [place], [place] [address]    Culture (A)  Final    >=100,000 COLONIES/mL MULTIPLE SPECIES PRESENT, SUGGEST RECOLLECTION   Report Status 09/13/2018 FINAL  Final  Blood culture (routine x 2)     Status: None (Preliminary result)   Collection Time: 09/12/18  6:17 AM  Result Value Ref Range Status   Specimen Description BLOOD RIGHT WRIST  Final   Special Requests  Final    BOTTLES DRAWN AEROBIC AND ANAEROBIC Blood Culture results may not be optimal due to an excessive volume of blood received in culture bottles   Culture   Final    NO GROWTH 2 DAYS Performed at [facility], [address], [place], [place] [address]    Report Status PENDING  Incomplete  Blood culture (routine x 2)     Status: None (Preliminary result)   Collection Time: 09/12/18  6:20 AM  Result Value Ref Range Status   Specimen Description BLOOD RIGHT HAND  Final   Special Requests   Final    BOTTLES DRAWN AEROBIC AND ANAEROBIC Blood Culture results may not be optimal due to an excessive volume of blood received in culture bottles   Culture   Final    NO GROWTH 2 DAYS Performed at [facility], [address] [place], [place] [address]    Report Status PENDING  Incomplete     Labs: BNP (last 3 results) No results for input(s): BNP in the last 8760 hours. Basic Metabolic Panel: Recent Labs  Lab 09/12/18 0425  NA 136  K 3.9  CL 100  CO2 25  GLUCOSE 160*  BUN 15  CREATININE 1.01  CALCIUM 8.7*   Liver Function Tests: No results for input(s): AST, ALT, ALKPHOS, BILITOT, PROT, ALBUMIN in the last 168 hours. No results for  input(s): LIPASE, AMYLASE in the last 168 hours. Recent Labs  Lab 09/13/18 1330  AMMONIA 29   CBC: Recent Labs  Lab 09/12/18 0425 09/13/18 0144  WBC 11.4* 10.1  NEUTROABS 9.1*  --   HGB 13.1 11.8*  HCT 41.7 37.0*  MCV 91.2 88.5  PLT 202 199   Cardiac Enzymes: No results for input(s): CKTOTAL, CKMB, CKMBINDEX, TROPONINI in the last 168 hours. BNP: Invalid input(s): POCBNP CBG: Recent Labs  Lab 09/13/18 1218 09/13/18 1730 09/13/18 2055 09/14/18 0740 09/14/18 1236  GLUCAP 164* 197* 178* 129* 184*   D-Dimer No results for input(s): DDIMER in the last 72 hours. Hgb A1c Recent Labs    09/13/18 0144  HGBA1C 7.4*   Lipid Profile No results for input(s): CHOL, HDL, LDLCALC, TRIG, CHOLHDL, LDLDIRECT in the last 72 hours. Thyroid function studies Recent Labs    09/13/18 1208  TSH 1.394   Anemia work up Recent Labs    09/13/18 1208  VITAMINB12 300   Urinalysis    Component Value Date/Time   COLORURINE YELLOW 09/12/2018 0620   APPEARANCEUR CLEAR 09/12/2018 0620   LABSPEC 1.015 09/12/2018 0620   PHURINE 7.0 09/12/2018 0620   GLUCOSEU NEGATIVE 09/12/2018 0620   HGBUR NEGATIVE 09/12/2018 0620   BILIRUBINUR NEGATIVE 09/12/2018 0620   KETONESUR NEGATIVE 09/12/2018 0620   PROTEINUR 30 (A) 09/12/2018 0620   UROBILINOGEN 0.2 12/02/2013 0611   NITRITE NEGATIVE 09/12/2018 0620   LEUKOCYTESUR NEGATIVE 09/12/2018 0620   Sepsis Labs Invalid input(s): PROCALCITONIN,  WBC,  LACTICIDVEN Microbiology Recent Results (from the past 240 hour(s))  Urine culture     Status: Abnormal   Collection Time: 09/12/18  6:13 AM  Result Value Ref Range Status   Specimen Description URINE, RANDOM  Final   Special Requests   Final    NONE Performed at [facility], [address], [place], [place] [address]    Culture (A)  Final    >=100,000 COLONIES/mL MULTIPLE SPECIES PRESENT, SUGGEST RECOLLECTION   Report Status 09/13/2018 FINAL  Final  Blood culture (routine x 2)  Status: None (Preliminary result)   Collection Time: 09/12/18  6:17 AM  Result Value Ref Range Status   Specimen Description BLOOD RIGHT WRIST  Final   Special Requests   Final    BOTTLES DRAWN AEROBIC AND ANAEROBIC Blood Culture results may not be optimal due to an excessive volume of blood received in culture bottles   Culture   Final    NO GROWTH 2 DAYS Performed at [facility], [address], [place], [place] [address]    Report Status PENDING  Incomplete  Blood culture (routine x 2)     Status: None (Preliminary result)   Collection Time: 09/12/18  6:20 AM  Result Value Ref Range Status   Specimen Description BLOOD RIGHT HAND  Final   Special Requests   Final    BOTTLES DRAWN AEROBIC AND ANAEROBIC Blood Culture results may not be optimal due to an excessive volume of blood received in culture bottles   Culture   Final    NO GROWTH 2 DAYS Performed at [facility], [address], [place], [place] [address]    Report Status PENDING  Incomplete     Time coordinating discharge: 25* minutes  SIGNED:   Lanae Boast, MD  [facility] 09/14/2018, 5:01 PM Pager [phone]  If 7PM-7AM, please contact night-coverage [email or web address] Password [email or web address]  Please Note: This patient record was dictated using Animal nutritionist. Chart creation errors have been sought, but may not always have been located. Such creation errors do not reflect on the Standard of Medical Care.

## 2018-09-17 LAB — CULTURE, BLOOD (ROUTINE X 2)
CULTURE: NO GROWTH
Culture: NO GROWTH

## 2018-09-24 DIAGNOSIS — G20 Parkinson's disease: Secondary | ICD-10-CM | POA: Diagnosis not present

## 2018-09-24 DIAGNOSIS — R296 Repeated falls: Secondary | ICD-10-CM | POA: Diagnosis not present

## 2018-09-24 DIAGNOSIS — F0390 Unspecified dementia without behavioral disturbance: Secondary | ICD-10-CM | POA: Diagnosis not present

## 2018-09-24 DIAGNOSIS — J209 Acute bronchitis, unspecified: Secondary | ICD-10-CM | POA: Diagnosis not present

## 2018-09-27 ENCOUNTER — Encounter: Payer: Self-pay | Admitting: Neurology

## 2018-10-01 NOTE — Progress Notes (Deleted)
Daniel Morrow was seen today in the movement disorders clinic for neurologic consultation at the request of Kirby FunkGriffin, John, MD.  The consultation is for the evaluation of PD.  The records that were made available to me were reviewed, .including PCP records, [facility] records and records from hospitalization.  Patient is a 78 year old white male with a history of dementia, on Namenda, and CVA in 2013 who presented to the hospital on September 12, 2018 with complaints of shortness of breath and agitation.  Family expressed concern about possible Parkinson's disease, as patient had a tremor.  Dr. Otelia LimesLindzen was consulted on September 13, 2018.  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  Patient did have a CT of the brain on September 12, 2018.  There was no acute findings on that CT.  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  Last MRI of the brain was done in March, 2017.  I have reviewed that.  There was mild small vessel disease.   Specific Symptoms:  Tremor: {yes no:314532} Family hx of similar:  {yes no:314532} Voice: *** Sleep: ***  Vivid Dreams:  {yes no:314532}  Acting out dreams:  {yes no:314532} Wet Pillows: {yes no:314532} Postural symptoms:  {yes no:314532}  Falls?  {yes no:314532} Bradykinesia symptoms: {parkinson brady:18041} Loss of smell:  {yes no:314532} Loss of taste:  {yes no:314532} Urinary Incontinence:  {yes no:314532} Difficulty Swallowing:  {yes no:314532} Handwriting, micrographia: {yes no:314532} Trouble with ADL's:  {yes no:314532}  Trouble buttoning clothing: {yes no:314532} Depression:  {yes no:314532} Memory changes:  {yes no:314532} Hallucinations:  {yes no:314532}  visual distortions: {yes no:314532} N/V:  {yes no:314532} Lightheaded:  {yes no:314532}  Syncope: {yes  no:314532} Diplopia:  {yes no:314532} Dyskinesia:  {yes no:314532} Prior exposure to reglan/antipsychotics: {yes no:314532}  Neuroimaging of the brain has *** previously been performed.  It *** available for my review today.  PREVIOUS MEDICATIONS: {Parkinson's RX:18200}  ALLERGIES:  No Known Allergies  CURRENT MEDICATIONS:  Outpatient Encounter Medications as of 10/04/2018  Medication Sig  . atorvastatin (LIPITOR) 40 MG tablet Take 40 mg by mouth at bedtime.   Marland Kitchen. azithromycin (ZITHROMAX) 500 MG tablet Take 1 tablet oral daily.  . benazepril (LOTENSIN) 10 MG tablet Take 10 mg by mouth daily.  . brimonidine (ALPHAGAN) 0.2 % ophthalmic solution Place 1 drop into the right eye 2 (two) times daily.   Marland Kitchen. buPROPion (WELLBUTRIN XL) 150 MG 24 hr tablet Take 150 mg by mouth daily.  . carbidopa-levodopa (SINEMET IR) 25-100 MG tablet Take 1 tablet by mouth 3 (three) times daily.  Marland Kitchen. dipyridamole-aspirin (AGGRENOX) 200-25 MG 12hr capsule Take 1 capsule by mouth 2 (two) times daily.  . empagliflozin (JARDIANCE) 25 MG TABS tablet Take 25 mg by mouth daily.  Marland Kitchen. glimepiride (AMARYL) 4 MG tablet Take 4 mg by mouth daily.   Marland Kitchen. latanoprost (XALATAN) 0.005 % ophthalmic solution Place 1 drop into the right eye at bedtime.   . memantine (NAMENDA) 10 MG tablet Take 1 tablet (10 mg total) by mouth 2 (two) times daily.  . mirabegron ER (MYRBETRIQ) 25 MG TB24 tablet Take 25 mg by mouth at bedtime.  . Multiple Vitamin (MULTIVITAMIN WITH MINERALS) TABS tablet Take 1 tablet by mouth daily.  . pantoprazole (PROTONIX) 40 MG tablet Take 40 mg by mouth daily.  . QUEtiapine (SEROQUEL) 25 MG tablet Take 0.5  tablets (12.5 mg total) by mouth at bedtime as needed for up to 8 doses (agitation).  . Semaglutide, 1 MG/DOSE, (OZEMPIC, 1 MG/DOSE,) 2 MG/1.5ML SOPN Inject 2 mg into the skin every Monday.  . sertraline (ZOLOFT) 100 MG tablet Take 1 tablet (100 mg total) by mouth daily.  . tamsulosin (FLOMAX) 0.4 MG CAPS capsule Take 0.4  mg by mouth at bedtime.   . timolol (TIMOPTIC) 0.5 % ophthalmic solution Place 1 drop into the right eye 2 (two) times daily.  . vitamin B-12 1000 MCG tablet Take 1 tablet (1,000 mcg total) by mouth daily.   No facility-administered encounter medications on file as of 10/04/2018.     PAST MEDICAL HISTORY:   Past Medical History:  Diagnosis Date  . Arthritis    "knees" (09/14/2018)  . Blind left eye   . Chronic bronchitis (HCC)   . Dementia (HCC)   . Depression   . GERD (gastroesophageal reflux disease)   . Glaucoma   . History of kidney stones   . Hyperlipidemia   . Hypertension   . Pneumonia    "maybe twice" (09/14/2018)  . Recurrent UTI (urinary tract infection)   . Stroke [facility](H[facility]) 11/2011   "didn't effect him at the time" (09/14/2018)  . Type II diabetes mellitus (HCC)     PAST SURGICAL HISTORY:   Past Surgical History:  Procedure Laterality Date  . ABDOMINAL HERNIA REPAIR    . BACK SURGERY    . CATARACT EXTRACTION W/ INTRAOCULAR LENS  IMPLANT, BILATERAL Bilateral   . COLONOSCOPY    . ESOPHAGOGASTRODUODENOSCOPY (EGD) WITH ESOPHAGEAL DILATION  X 2  . FRACTURE SURGERY    . HERNIA REPAIR    . PATELLA FRACTURE SURGERY Left   . POSTERIOR CERVICAL FUSION/FORAMINOTOMY      SOCIAL HISTORY:   Social History   Socioeconomic History  . Marital status: Married    Spouse name: Damian Leavellrudy  . Number of children: 3  . Years of education: College  . Highest education level: Not on file  Occupational History  . Occupation: Retired  Engineer, productionocial Needs  . Financial resource strain: Not on file  . Food insecurity:    Worry: Not on file    Inability: Not on file  . Transportation needs:    Medical: Not on file    Non-medical: Not on file  Tobacco Use  . Smoking status: Former Smoker    Packs/day: 1.00    Years: 20.00    Pack years: 20.00    Types: Cigarettes    Last attempt to quit: 1980    Years since quitting: 40.0  . Smokeless tobacco: Former NeurosurgeonUser    Quit date: 1985    Substance and Sexual Activity  . Alcohol use: No  . Drug use: Never  . Sexual activity: Not on file  Lifestyle  . Physical activity:    Days per week: Not on file    Minutes per session: Not on file  . Stress: Not on file  Relationships  . Social connections:    Talks on phone: Not on file    Gets together: Not on file    Attends religious service: Not on file    Active member of club or organization: Not on file    Attends meetings of clubs or organizations: Not on file    Relationship status: Not on file  . Intimate partner violence:    Fear of current or ex partner: Not on file    Emotionally abused: Not  on file    Physically abused: Not on file    Forced sexual activity: Not on file  Other Topics Concern  . Not on file  Social History Narrative   Patient lives at home with family.   Caffeine Use: 2 cups daily    FAMILY HISTORY:   Family Status  Relation Name Status  . Mother  Deceased at age 40       blood disorder  . Father  Deceased at age 30       Brain tumor    ROS:  ROS  PHYSICAL EXAMINATION:    VITALS:  There were no vitals filed for this visit.  GEN:  The patient appears stated age and is in NAD. HEENT:  Normocephalic, atraumatic.  The mucous membranes are moist. The superficial temporal arteries are without ropiness or tenderness. CV:  RRR Lungs:  CTAB Neck/HEME:  There are no carotid bruits bilaterally.  Neurological examination:  Orientation: The patient is alert and oriented x3. Fund of knowledge is appropriate.  Recent and remote memory are intact.  Attention and concentration are normal.    Able to name objects and repeat phrases. Cranial nerves: There is good facial symmetry. Pupils are equal round and reactive to light bilaterally. Fundoscopic exam reveals clear margins bilaterally. Extraocular muscles are intact. The visual fields are full to confrontational testing. The speech is fluent and clear. Soft palate rises symmetrically and there is  no tongue deviation. Hearing is intact to conversational tone. Sensation: Sensation is intact to light and pinprick throughout (facial, trunk, extremities). Vibration is intact at the bilateral big toe. There is no extinction with double simultaneous stimulation. There is no sensory dermatomal level identified. Motor: Strength is 5/5 in the bilateral upper and lower extremities.   Shoulder shrug is equal and symmetric.  There is no pronator drift. Deep tendon reflexes: Deep tendon reflexes are 2/4 at the bilateral biceps, triceps, brachioradialis, patella and achilles. Plantar responses are downgoing bilaterally.  Movement examination: Tone: There is ***tone in the bilateral upper extremities.  The tone in the lower extremities is ***.  Abnormal movements: *** Coordination:  There is *** decremation with RAM's, *** Gait and Station: The patient has *** difficulty arising out of a deep-seated chair without the use of the hands. The patient's stride length is ***.  The patient has a *** pull test.        Chemistry      Component Value Date/Time   NA 136 09/12/2018 0425   NA 144 06/03/2016 1240   K 3.9 09/12/2018 0425   CL 100 09/12/2018 0425   CO2 25 09/12/2018 0425   BUN 15 09/12/2018 0425   BUN 18 06/03/2016 1240   CREATININE 1.01 09/12/2018 0425      Component Value Date/Time   CALCIUM 8.7 (L) 09/12/2018 0425   ALKPHOS 89 03/03/2018 1651   AST 19 03/03/2018 1651   ALT 18 03/03/2018 1651   BILITOT 1.0 03/03/2018 1651     Lab Results  Component Value Date   TSH 1.394 09/13/2018     ASSESSMENT/PLAN:  ***This did not include the 40 min of record review which was detailed above, which was non face to face time.   Cc:  Kirby Funk, MD

## 2018-10-04 ENCOUNTER — Ambulatory Visit: Payer: Medicare Other | Admitting: Neurology

## 2018-10-07 NOTE — Progress Notes (Signed)
Daniel Morrow was seen today in the movement disorders clinic for neurologic consultation at the request of Kirby Funk, MD.  The consultation is for the evaluation of PD.  The records that were made available to me were reviewed, including PCP records, [facility] records and records from hospitalization. This patient is accompanied in the office by his spouse who supplements the history. Patient is a 78 year old white male with a history of dementia, on Namenda, and CVA in 2013 who presented to the hospital on September 12, 2018 with complaints of shortness of breath and agitation.  Family expressed concern about possible Parkinson's disease, as patient had a tremor.  Dr. Otelia Limes was consulted on September 13, 2018.  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  Pt/wife unsure if that was helpful.  Patient did have a CT of the brain on September 12, 2018.  There was no acute findings on that CT.  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  Last MRI of the brain was done in March, 2017.  I have reviewed that.  There was mild small vessel disease.  Last took carbidopa/levodopa 25/100 was 10pm last night.  They usually take it at 10am/4pm/10pm   Specific Symptoms:  Tremor: Yes.  , about 1.5 years, bilateral UE, perhaps the R hand a bit more than the L.  More with activation than rest.   Family hx of similar:  No. Voice: no change Sleep: sleeps well  Vivid Dreams:  No.  Acting out dreams:  No., but does awaken in the middle of the night and takes off pajamas and diaper but wife thinks that he is awake.  He is wandering out of the house.  She found him outside in his underwear in cold weather. Wet Pillows: No. Postural symptoms:  Yes.    Falls?  Yes.  , last fall 1 week ago.  Was trying to get something out of  garage and fell.  Has PT coming in the home. Bradykinesia symptoms: slow movements and drooling while awake; does not shuffle at all.   Loss of smell:  No. Loss of taste:  No. Urinary Incontinence:  Yes.   Difficulty Swallowing:  Yes.  , in the past - has had esophagus stretched x 3.  On protonix for this and helps Handwriting, micrographia: Yes.  , some but seems better lately Trouble with ADL's:  Yes.    Trouble buttoning clothing: No. Depression:  No. per patient but wife wonders because he doesn't talk to her anymore and sits in chair and stairs Memory changes:  Yes.  , started with word finding/speech trouble 2 years ago.  Speech progressively worse over the last 2 years. Hallucinations:  No.  visual distortions: No. N/V:  No. Lightheaded:  No.  Syncope: No. Diplopia:  No. Dyskinesia:  No. Prior exposure to reglan/antipsychotics: No.  I reviewed his CT brain from 09/12/18.  There was mild atrophy and small vessel disease.    PREVIOUS MEDICATIONS: Sinemet  ALLERGIES:  No Known Allergies  CURRENT MEDICATIONS:  Outpatient Encounter Medications as of 10/13/2018  Medication Sig  . atorvastatin (LIPITOR) 40 MG tablet Take 40 mg by mouth at bedtime.   . benazepril (LOTENSIN) 10 MG tablet Take 10 mg by mouth daily.  . brimonidine (ALPHAGAN) 0.2 % ophthalmic solution Place 1 drop into the right eye  2 (two) times daily.   Marland Kitchen buPROPion (WELLBUTRIN XL) 150 MG 24 hr tablet Take 150 mg by mouth daily.  . carbidopa-levodopa (SINEMET IR) 25-100 MG tablet Take 1 tablet by mouth 3 (three) times daily.  Marland Kitchen dipyridamole-aspirin (AGGRENOX) 200-25 MG 12hr capsule Take 1 capsule by mouth 2 (two) times daily.  . empagliflozin (JARDIANCE) 25 MG TABS tablet Take 25 mg by mouth daily.  Marland Kitchen glimepiride (AMARYL) 4 MG tablet Take 4 mg by mouth daily.   Marland Kitchen latanoprost (XALATAN) 0.005 % ophthalmic solution Place 1 drop into the right eye at bedtime.   Marland Kitchen loperamide (IMODIUM) 1 MG/5ML solution Take by mouth as  needed for diarrhea or loose stools.  . memantine (NAMENDA) 10 MG tablet Take 1 tablet (10 mg total) by mouth 2 (two) times daily.  . mirabegron ER (MYRBETRIQ) 50 MG TB24 tablet Take 50 mg by mouth daily.  . Multiple Vitamin (MULTIVITAMIN WITH MINERALS) TABS tablet Take 1 tablet by mouth daily.  . pantoprazole (PROTONIX) 20 MG tablet Take 20 mg by mouth daily.  . Semaglutide, 1 MG/DOSE, (OZEMPIC, 1 MG/DOSE,) 2 MG/1.5ML SOPN Inject 2 mg into the skin every Monday.  . sertraline (ZOLOFT) 100 MG tablet Take 1 tablet (100 mg total) by mouth daily.  . tamsulosin (FLOMAX) 0.4 MG CAPS capsule Take 0.4 mg by mouth at bedtime.   . timolol (TIMOPTIC) 0.5 % ophthalmic solution Place 1 drop into the right eye 2 (two) times daily.  . vitamin B-12 1000 MCG tablet Take 1 tablet (1,000 mcg total) by mouth daily.  . [DISCONTINUED] azithromycin (ZITHROMAX) 500 MG tablet Take 1 tablet oral daily.  . [DISCONTINUED] mirabegron ER (MYRBETRIQ) 25 MG TB24 tablet Take 25 mg by mouth at bedtime.  . [DISCONTINUED] pantoprazole (PROTONIX) 40 MG tablet Take 40 mg by mouth daily.  . [DISCONTINUED] QUEtiapine (SEROQUEL) 25 MG tablet Take 0.5 tablets (12.5 mg total) by mouth at bedtime as needed for up to 8 doses (agitation).   No facility-administered encounter medications on file as of 10/13/2018.     PAST MEDICAL HISTORY:   Past Medical History:  Diagnosis Date  . Arthritis    "knees" (09/14/2018)  . Blind left eye   . Chronic bronchitis (HCC)   . Dementia (HCC)   . Depression   . GERD (gastroesophageal reflux disease)   . Glaucoma   . History of kidney stones   . Hyperlipidemia   . Hypertension   . Pneumonia    "maybe twice" (09/14/2018)  . Recurrent UTI (urinary tract infection)   . Stroke [facility]) 11/2011   "didn't effect him at the time" (09/14/2018)  . Type II diabetes mellitus (HCC)     PAST SURGICAL HISTORY:   Past Surgical History:  Procedure Laterality Date  . ABDOMINAL HERNIA REPAIR    . BACK  SURGERY    . CATARACT EXTRACTION W/ INTRAOCULAR LENS  IMPLANT, BILATERAL Bilateral   . COLONOSCOPY    . ESOPHAGOGASTRODUODENOSCOPY (EGD) WITH ESOPHAGEAL DILATION  X 2  . FRACTURE SURGERY    . HERNIA REPAIR    . PATELLA FRACTURE SURGERY Left   . POSTERIOR CERVICAL FUSION/FORAMINOTOMY      SOCIAL HISTORY:   Social History   Socioeconomic History  . Marital status: Married    Spouse name: [name]  . Number of children: 3  . Years of education: College  . Highest education level: Not on file  Occupational History  . Occupation: Retired  Engineer, production  . Financial resource strain: Not on  file  . Food insecurity:    Worry: Not on file    Inability: Not on file  . Transportation needs:    Medical: Not on file    Non-medical: Not on file  Tobacco Use  . Smoking status: Former Smoker    Packs/day: 1.00    Years: 20.00    Pack years: 20.00    Types: Cigarettes    Last attempt to quit: 1980    Years since quitting: 40.0  . Smokeless tobacco: Former NeurosurgeonUser    Quit date: 1985  Substance and Sexual Activity  . Alcohol use: No  . Drug use: Never  . Sexual activity: Not on file  Lifestyle  . Physical activity:    Days per week: Not on file    Minutes per session: Not on file  . Stress: Not on file  Relationships  . Social connections:    Talks on phone: Not on file    Gets together: Not on file    Attends religious service: Not on file    Active member of club or organization: Not on file    Attends meetings of clubs or organizations: Not on file    Relationship status: Not on file  . Intimate partner violence:    Fear of current or ex partner: Not on file    Emotionally abused: Not on file    Physically abused: Not on file    Forced sexual activity: Not on file  Other Topics Concern  . Not on file  Social History Narrative   Patient lives at home with family.   Caffeine Use: 2 cups daily    FAMILY HISTORY:   Family Status  Relation Name Status  . Mother  Deceased at  age 76       blood disorder  . Father  Deceased at age 76       Brain tumor  . Brother x3 Alive  . Brother x1 Deceased  . Daughter x3 Alive    ROS:  Review of Systems  Constitutional: Negative.   HENT: Negative.   Eyes: Positive for blurred vision (no vision on L eye due to glaucoma).  Respiratory: Negative.   Cardiovascular: Negative.   Gastrointestinal: Negative.   Genitourinary:       Urinary incontinence  Musculoskeletal: Positive for falls.  Skin: Negative.     PHYSICAL EXAMINATION:    VITALS:   Vitals:   10/13/18 1019  BP: (!) 154/84  Pulse: 66  SpO2: 93%  Weight: 201 lb (91.2 kg)  Height: 5\' 10"  (1.778 m)    GEN:  The patient appears stated age and is in NAD. HEENT:  Normocephalic, atraumatic.  The mucous membranes are moist. The superficial temporal arteries are without ropiness or tenderness. CV:  RRR Lungs:  CTAB Neck/HEME:  There are no carotid bruits bilaterally.  Neurological examination:  Orientation:  Montreal Cognitive Assessment  10/13/2018  Visuospatial/ Executive (0/5) 1  Naming (0/3) 2  Attention: Read list of digits (0/2) 1  Attention: Read list of letters (0/1) 0  Attention: Serial 7 subtraction starting at 100 (0/3) 1  Language: Repeat phrase (0/2) 0  Language : Fluency (0/1) 0  Abstraction (0/2) 0  Delayed Recall (0/5) 1  Orientation (0/6) 3  Total 9  Adjusted Score (based on education) 9    Cranial nerves: There is good facial symmetry. There is facial hypomimia with lips parted.  Pupils are equal round and reactive to light bilaterally. Fundoscopic exam reveals clear margins  bilaterally. Extraocular muscles are intact. The visual fields are full to confrontational testing. The speech lacks spontaneity and he speaks very little.  When he does speak, it is clear.  Soft palate rises symmetrically and there is no tongue deviation. Hearing is intact to conversational tone. Sensation: Sensation is intact to light and pinprick throughout  (facial, trunk, extremities). Vibration is intact at the bilateral big toe. There is no consistent extinction with double simultaneous stimulation. There is no sensory dermatomal level identified. Motor: Strength is 5/5 in the bilateral upper and lower extremities.   Shoulder shrug is equal and symmetric.  There is no pronator drift. Deep tendon reflexes: Deep tendon reflexes are 2-/4 at the bilateral biceps, triceps, brachioradialis, 2+ at the bilateral patella and achilles. Plantar responses are downgoing bilaterally.  Movement examination: Tone: pt has significant trouble relaxing (gegenhalten).   Abnormal movements: rare tremor is noted in both hands at rest, independent of one another.  He does have LUE tremor with ambulation.   Coordination:  There is mild decremation with RAM's, seen mostly with  alternation of supination/pronation of the forearm Gait and Station: The patient pushes off of the chair to arise.  The patient's stride length is good and he is somewhat wide-based.  He has a left upper extremity resting tremor with ambulation.    Chemistry      Component Value Date/Time   NA 136 09/12/2018 0425   NA 144 06/03/2016 1240   K 3.9 09/12/2018 0425   CL 100 09/12/2018 0425   CO2 25 09/12/2018 0425   BUN 15 09/12/2018 0425   BUN 18 06/03/2016 1240   CREATININE 1.01 09/12/2018 0425      Component Value Date/Time   CALCIUM 8.7 (L) 09/12/2018 0425   ALKPHOS 89 03/03/2018 1651   AST 19 03/03/2018 1651   ALT 18 03/03/2018 1651   BILITOT 1.0 03/03/2018 1651     Lab Results  Component Value Date   TSH 1.394 09/13/2018     ASSESSMENT/PLAN:  1.  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  His wife states that his for symptom was about 2 years ago and was a change in speech.  This has deteriorated since, so much so that he  hardly speaks to her at all (and she translates this is depression).  [facility] and remains on that medication.  With the course of time, he has now developed left upper extremity resting tremor and some bradykinesia.  We talked about the levodopa that he is currently on (was off it for the visit).  Generally, this is not used with primary progressive aphasia.  His wife really was not sure it helped, but she did state that the physical therapist told her to tell me that she thought it was helpful and that the patient could not walk before the medication (but he will also was otherwise sick in the hospital).  For now, I did not change the medication.  I am going to do a DaTscan.  My suspicion is that this will be positive.  I will hold his Wellbutrin and sertraline prior to the DaTscan.  -Talked to the patient and his wife about continuing with mental exercises in the home and also increasing them.  Discussed exactly what this means.  Also talked about safe, cardiovascular exercise.  2.  Patient has a history of stroke and he was told to follow-up with [facility] for this.  He has an appointment in about 6 months.  3.  Follow-up with me will be after the DaTscan.  Much greater than 50% of this visit was spent in counseling and coordinating care.  Total face to face time:  60 min.  This did not include the 40 min of record review which was detailed above, which was non face to face time.   Cc:  Kirby Funk, MD

## 2018-10-11 DIAGNOSIS — G309 Alzheimer's disease, unspecified: Secondary | ICD-10-CM | POA: Diagnosis not present

## 2018-10-11 DIAGNOSIS — R296 Repeated falls: Secondary | ICD-10-CM | POA: Diagnosis not present

## 2018-10-11 DIAGNOSIS — I69320 Aphasia following cerebral infarction: Secondary | ICD-10-CM | POA: Diagnosis not present

## 2018-10-11 DIAGNOSIS — E1142 Type 2 diabetes mellitus with diabetic polyneuropathy: Secondary | ICD-10-CM | POA: Diagnosis not present

## 2018-10-11 DIAGNOSIS — I129 Hypertensive chronic kidney disease with stage 1 through stage 4 chronic kidney disease, or unspecified chronic kidney disease: Secondary | ICD-10-CM | POA: Diagnosis not present

## 2018-10-11 DIAGNOSIS — E1122 Type 2 diabetes mellitus with diabetic chronic kidney disease: Secondary | ICD-10-CM | POA: Diagnosis not present

## 2018-10-11 DIAGNOSIS — N182 Chronic kidney disease, stage 2 (mild): Secondary | ICD-10-CM | POA: Diagnosis not present

## 2018-10-11 DIAGNOSIS — I69398 Other sequelae of cerebral infarction: Secondary | ICD-10-CM | POA: Diagnosis not present

## 2018-10-11 DIAGNOSIS — F0280 Dementia in other diseases classified elsewhere without behavioral disturbance: Secondary | ICD-10-CM | POA: Diagnosis not present

## 2018-10-11 DIAGNOSIS — N401 Enlarged prostate with lower urinary tract symptoms: Secondary | ICD-10-CM | POA: Diagnosis not present

## 2018-10-13 ENCOUNTER — Ambulatory Visit: Payer: Medicare Other | Admitting: Neurology

## 2018-10-13 ENCOUNTER — Encounter: Payer: Self-pay | Admitting: Neurology

## 2018-10-13 VITALS — BP 154/84 | HR 66 | Ht 70.0 in | Wt 201.0 lb

## 2018-10-13 DIAGNOSIS — R251 Tremor, unspecified: Secondary | ICD-10-CM | POA: Diagnosis not present

## 2018-10-13 DIAGNOSIS — G3109 Other frontotemporal dementia: Secondary | ICD-10-CM

## 2018-10-13 DIAGNOSIS — F0280 Dementia in other diseases classified elsewhere without behavioral disturbance: Secondary | ICD-10-CM | POA: Diagnosis not present

## 2018-10-13 DIAGNOSIS — G3101 Pick's disease: Secondary | ICD-10-CM | POA: Diagnosis not present

## 2018-10-13 NOTE — Patient Instructions (Addendum)
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    We have sent a referral to [facility] for your DAT scan and they will call you directly to schedule your appt.. They are located at [address]. If you need to contact them directly please call [phone].  Keep your appt with Dr. Pearlean Brownie.

## 2018-10-21 DIAGNOSIS — F3341 Major depressive disorder, recurrent, in partial remission: Secondary | ICD-10-CM | POA: Diagnosis not present

## 2018-10-21 DIAGNOSIS — K219 Gastro-esophageal reflux disease without esophagitis: Secondary | ICD-10-CM | POA: Diagnosis not present

## 2018-10-21 DIAGNOSIS — G3109 Other frontotemporal dementia: Secondary | ICD-10-CM | POA: Diagnosis not present

## 2018-10-21 DIAGNOSIS — E1122 Type 2 diabetes mellitus with diabetic chronic kidney disease: Secondary | ICD-10-CM | POA: Diagnosis not present

## 2018-10-27 DIAGNOSIS — E1165 Type 2 diabetes mellitus with hyperglycemia: Secondary | ICD-10-CM | POA: Diagnosis not present

## 2018-11-04 ENCOUNTER — Ambulatory Visit (HOSPITAL_COMMUNITY): Payer: Medicare Other

## 2018-11-04 ENCOUNTER — Encounter (HOSPITAL_COMMUNITY): Payer: Medicare Other

## 2018-11-12 ENCOUNTER — Telehealth: Payer: Self-pay | Admitting: Neurology

## 2018-11-12 MED ORDER — CARBIDOPA-LEVODOPA 25-100 MG PO TABS: 1.0000 | ORAL_TABLET | Freq: Three times a day (TID) | ORAL | 1 refills | Status: DC

## 2018-11-12 NOTE — Telephone Encounter (Signed)
Refill sent to pharmacy.   

## 2018-11-12 NOTE — Telephone Encounter (Signed)
Patient needs to get a refill on the [facility]. They used the [facility]

## 2018-11-17 ENCOUNTER — Encounter (HOSPITAL_COMMUNITY)
Admission: RE | Admit: 2018-11-17 | Discharge: 2018-11-17 | Disposition: A | Discharge status: Home / Self Care | Payer: Medicare Other | Source: Ambulatory Visit | Attending: Neurology | Admitting: Neurology

## 2018-11-17 DIAGNOSIS — F0390 Unspecified dementia without behavioral disturbance: Secondary | ICD-10-CM | POA: Diagnosis not present

## 2018-11-17 DIAGNOSIS — R251 Tremor, unspecified: Secondary | ICD-10-CM | POA: Diagnosis not present

## 2018-11-17 MED ORDER — IODINE STRONG (LUGOLS) 5 % PO SOLN
0.8000 mL | Freq: Once | ORAL | Status: AC
  Administered 2018-11-17: 0.8 mL via ORAL
  Filled 2018-11-17: qty 0.8

## 2018-11-17 MED ORDER — IOFLUPANE I 123 185 MBQ/2.5ML IV SOLN
4.7000 | Freq: Once | INTRAVENOUS | Status: AC
  Administered 2018-11-17: 4.7 via INTRAVENOUS

## 2018-11-18 ENCOUNTER — Telehealth: Payer: Self-pay | Admitting: Neurology

## 2018-11-18 NOTE — Telephone Encounter (Signed)
-----   Message from [name], DO sent at 11/18/2018  8:09 AM EST ----- Let pt/wife know that, as supsected, his DaT scan was positive, which can be in [facility].  Will discuss further at f/u visit.

## 2018-11-18 NOTE — Telephone Encounter (Signed)
Patient's wife made aware of results (on DPR) appt scheduled for May. He was placed on cancellation list.

## 2018-11-19 DIAGNOSIS — N3941 Urge incontinence: Secondary | ICD-10-CM | POA: Diagnosis not present

## 2018-11-19 DIAGNOSIS — R351 Nocturia: Secondary | ICD-10-CM | POA: Diagnosis not present

## 2018-11-19 DIAGNOSIS — N400 Enlarged prostate without lower urinary tract symptoms: Secondary | ICD-10-CM | POA: Diagnosis not present

## 2018-11-22 ENCOUNTER — Telehealth: Payer: Self-pay | Admitting: Neurology

## 2018-11-22 NOTE — Telephone Encounter (Signed)
Spoke with patient's wife about Dat Scan results again and what they mean. Let her know he is on the cancellation list and we will call if anything opens sooner. It sounds like he isn't worsening, but they just want to discuss with Dr. Arbutus Leas. She does think his depression is worse, denies SI/HI, and I advised contacting prescribing doctor of depression medication or seeing psychiatry to discuss. Will call with any further questions.

## 2018-11-22 NOTE — Telephone Encounter (Signed)
Patient's wife called and is needing to see if her husband can be seen sooner? She said she also has some questions for you. Please Call. Thanks

## 2018-11-26 DIAGNOSIS — E785 Hyperlipidemia, unspecified: Secondary | ICD-10-CM | POA: Diagnosis not present

## 2018-11-26 DIAGNOSIS — F0390 Unspecified dementia without behavioral disturbance: Secondary | ICD-10-CM | POA: Diagnosis not present

## 2018-11-26 DIAGNOSIS — E1165 Type 2 diabetes mellitus with hyperglycemia: Secondary | ICD-10-CM | POA: Diagnosis not present

## 2018-11-26 DIAGNOSIS — I10 Essential (primary) hypertension: Secondary | ICD-10-CM | POA: Diagnosis not present

## 2018-12-03 NOTE — Progress Notes (Signed)
Daniel Morrow was seen today in the movement disorders clinic for neurologic consultation at the request of Kirby Funk, MD.  The consultation is for the evaluation of PD.  The records that were made available to me were reviewed, including PCP records, [place] neurology records and records from hospitalization. This patient is accompanied in the office by his spouse who supplements the history. Patient is a 78 year old white male with a history of dementia, on Namenda, and CVA in 2013 who presented to the hospital on September 12, 2018 with complaints of shortness of breath and agitation.  Family expressed concern about possible Parkinson's disease, as patient had a tremor.  Dr. Otelia Limes was consulted on September 13, 2018.  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  Pt/wife unsure if that was helpful.  Patient did have a CT of the brain on September 12, 2018.  There was no acute findings on that CT.  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  Last MRI of the brain was done in March, 2017.  I have reviewed that.  There was mild small vessel disease.  Last took carbidopa/levodopa 25/100 was 10pm last night.  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 This patient is accompanied in the office by his spouse who supplements the history. He had a DaTscan since our last visit.  DaTscan demonstrated decreased activity in the left striatum, which can be positive in patients who have primary progressive aphasia.  Patient remains on carbidopa/levodopa 25/100, 1 tablet 3 times per day (started prior to seeing me).  No falls since our last visit.  Wife thinks that he is depressed and is concerned about tx for that.  States that he sits in his  chair and does not speak to her during much of the day.  I reviewed his CT brain from 09/12/18.  There was mild atrophy and small vessel disease.    PREVIOUS MEDICATIONS: Sinemet  ALLERGIES:  No Known Allergies  CURRENT MEDICATIONS:  Outpatient Encounter Medications as of 12/06/2018  Medication Sig   atorvastatin (LIPITOR) 40 MG tablet Take 40 mg by mouth at bedtime.    benazepril (LOTENSIN) 10 MG tablet Take 10 mg by mouth daily.   brimonidine (ALPHAGAN) 0.2 % ophthalmic solution Place 1 drop into the right eye 2 (two) times daily.    carbidopa-levodopa (SINEMET IR) 25-100 MG tablet Take 1 tablet by mouth 3 (three) times daily.   dipyridamole-aspirin (AGGRENOX) 200-25 MG 12hr capsule Take 1 capsule by mouth 2 (two) times daily.   glimepiride (AMARYL) 4 MG tablet Take 4 mg by mouth daily.    latanoprost (XALATAN) 0.005 % ophthalmic solution Place 1 drop into the right eye at bedtime.    memantine (NAMENDA) 10 MG tablet Take 1 tablet (10 mg total) by mouth 2 (two) times daily.   Multiple Vitamin (MULTIVITAMIN WITH MINERALS) TABS tablet Take 1 tablet by mouth daily.   pantoprazole (PROTONIX) 20 MG tablet Take 20 mg by mouth daily.   Semaglutide, 1 MG/DOSE, (OZEMPIC, 1 MG/DOSE,) 2 MG/1.5ML SOPN Inject 2 mg into the skin every Monday.   sertraline (ZOLOFT) 100 MG tablet Take 1 tablet (100 mg total) by mouth daily.   timolol (TIMOPTIC) 0.5 % ophthalmic solution Place 1 drop into the right  eye 2 (two) times daily.   [DISCONTINUED] carbidopa-levodopa (SINEMET IR) 25-100 MG tablet Take 1 tablet by mouth 3 (three) times daily.   [DISCONTINUED] empagliflozin (JARDIANCE) 25 MG TABS tablet Take 25 mg by mouth daily.   [DISCONTINUED] loperamide (IMODIUM) 1 MG/5ML solution Take by mouth as needed for diarrhea or loose stools.   [DISCONTINUED] mirabegron ER (MYRBETRIQ) 50 MG TB24 tablet Take 50 mg by mouth daily.   [DISCONTINUED] tamsulosin (FLOMAX) 0.4 MG CAPS capsule Take 0.4 mg by  mouth at bedtime.    No facility-administered encounter medications on file as of 12/06/2018.     PAST MEDICAL HISTORY:   Past Medical History:  Diagnosis Date   Arthritis    "knees" (09/14/2018)   Blind left eye    Chronic bronchitis (HCC)    Dementia (HCC)    Depression    GERD (gastroesophageal reflux disease)    Glaucoma    History of kidney stones    Hyperlipidemia    Hypertension    Pneumonia    "maybe twice" (09/14/2018)   Recurrent UTI (urinary tract infection)    Stroke (HCC) 11/2011   "didn't effect him at the time" (09/14/2018)   Type II diabetes mellitus (HCC)     PAST SURGICAL HISTORY:   Past Surgical History:  Procedure Laterality Date   ABDOMINAL HERNIA REPAIR     BACK SURGERY     CATARACT EXTRACTION W/ INTRAOCULAR LENS  IMPLANT, BILATERAL Bilateral    COLONOSCOPY     ESOPHAGOGASTRODUODENOSCOPY (EGD) WITH ESOPHAGEAL DILATION  X 2   FRACTURE SURGERY     HERNIA REPAIR     PATELLA FRACTURE SURGERY Left    POSTERIOR CERVICAL FUSION/FORAMINOTOMY      SOCIAL HISTORY:   Social History   Socioeconomic History   Marital status: Married    Spouse name: Trudy   Number of children: 3   Years of education: Automotive engineer   Highest education level: Not on file  Occupational History   Occupation: Retired  Ecologist strain: Not on Pensions consultant insecurity:    Worry: Not on file    Inability: Not on Occupational hygienist needs:    Medical: Not on file    Non-medical: Not on file  Tobacco Use   Smoking status: Former Smoker    Packs/day: 1.00    Years: 20.00    Pack years: 20.00    Types: Cigarettes    Last attempt to quit: 1980    Years since quitting: 40.2   Smokeless tobacco: Former Neurosurgeon    Quit date: 1985  Substance and Sexual Activity   Alcohol use: No   Drug use: Never   Sexual activity: Not on file  Lifestyle   Physical activity:    Days per week: Not on file    Minutes per session: Not  on file   Stress: Not on file  Relationships   Social connections:    Talks on phone: Not on file    Gets together: Not on file    Attends religious service: Not on file    Active member of club or organization: Not on file    Attends meetings of clubs or organizations: Not on file    Relationship status: Not on file   Intimate partner violence:    Fear of current or ex partner: Not on file    Emotionally abused: Not on file    Physically abused: Not on file    Forced  sexual activity: Not on file  Other Topics Concern   Not on file  Social History Narrative   Patient lives at home with family.   Caffeine Use: 2 cups daily    FAMILY HISTORY:   Family Status  Relation Name Status   Mother  Deceased at age 36       blood disorder   Father  Deceased at age 90       Brain tumor   Brother x3 Alive   Brother x1 Deceased   Daughter x3 Alive    ROS:  Review of Systems  Constitutional: Negative.   HENT: Negative.   Eyes: Negative.   Respiratory: Negative.   Cardiovascular: Negative.   Genitourinary: Negative.   Musculoskeletal: Negative.   Skin: Negative.     PHYSICAL EXAMINATION:    VITALS:   Vitals:   12/06/18 1417  BP: 130/88  Pulse: 76  Temp: 98.2 F (36.8 C)  TempSrc: Oral  SpO2: 92%  Weight: 200 lb (90.7 kg)  Height: 5\' 10"  (1.778 m)    GEN:  The patient appears stated age and is in NAD. HEENT:  Normocephalic, atraumatic.  The mucous membranes are moist. The superficial temporal arteries are without ropiness or tenderness. CV:  RRR Lungs:  CTAB Neck/HEME:  There are no carotid bruits bilaterally.  Neurological examination:  Orientation:  Montreal Cognitive Assessment  10/13/2018  Visuospatial/ Executive (0/5) 1  Naming (0/3) 2  Attention: Read list of digits (0/2) 1  Attention: Read list of letters (0/1) 0  Attention: Serial 7 subtraction starting at 100 (0/3) 1  Language: Repeat phrase (0/2) 0  Language : Fluency (0/1) 0  Abstraction  (0/2) 0  Delayed Recall (0/5) 1  Orientation (0/6) 3  Total 9  Adjusted Score (based on education) 9    Cranial nerves: There is good facial symmetry. There is facial hypomimia with lips parted.   Extraocular muscles are intact. The visual fields are full to confrontational testing. The speech lacks spontaneity and he speaks very little.  When he does speak, it is clear.  He is able to name correctly a safety pin, shoehorn and describes a tongue depressor (initially calls it a "file").  Soft palate rises symmetrically and there is no tongue deviation. Hearing is intact to conversational tone. Sensation: Sensation is intact to light throughout. Motor: Strength is 5/5 in the bilateral upper and lower extremities.   Shoulder shrug is equal and symmetric.  There is no pronator drift.  Movement examination: Tone: pt has significant trouble relaxing (gegenhalten).   Abnormal movements: rare LUE resting tremor He does have LUE tremor with ambulation.   Coordination:  There is slowness with all RAMs.  It is difficult to tell whether there is decremation given this. Gait and Station: The patient pushes off of the chair to arise.  The patient's stride length is good today.  He is just mildly forward flexed at the waist.      Chemistry      Component Value Date/Time   NA 136 09/12/2018 0425   NA 144 06/03/2016 1240   K 3.9 09/12/2018 0425   CL 100 09/12/2018 0425   CO2 25 09/12/2018 0425   BUN 15 09/12/2018 0425   BUN 18 06/03/2016 1240   CREATININE 1.01 09/12/2018 0425      Component Value Date/Time   CALCIUM 8.7 (L) 09/12/2018 0425   ALKPHOS 89 03/03/2018 1651   AST 19 03/03/2018 1651   ALT 18 03/03/2018 1651  BILITOT 1.0 03/03/2018 1651     Lab Results  Component Value Date   TSH 1.394 09/13/2018     ASSESSMENT/PLAN:  1.  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  His wife states that his for symptom was about 2 years ago and was a change in speech.  This has deteriorated since, so much so that he hardly speaks to her at all (and she translates this is depression).  He has been treated by [facility] with [facility] and remains on that medication.  With the course of time, he has now developed left upper extremity resting tremor and some bradykinesia.  We talked about the levodopa that he is currently on (was off it for the visit).  Generally, this is not used with primary progressive aphasia.  His wife wanted to continue that for right now, but we ultimately decided to do a levodopa challenge test just to see how much it is actually contributing from a beneficial standpoint.  I did tell his wife that with the course of time this can be detrimental to patient's and cause increasing confusion.  -Talked to the patient and his wife about continuing with mental exercises in the home and also increasing them.  Discussed exactly what this means.  Also talked about safe, cardiovascular exercise.  -wife thinks that he is depressed and asks me about changing his antidepressant.  I will leave this to his primary care physician who was prescribing his sertraline, but I also told his wife that I am not convinced that this is all depression (if any) and his lack of speaking and lack of facial expression is due to the disease and not depression.  2.  Patient has a history of stroke and he was told to follow-up with [facility] neurology for this.  He has an appointment in June with Dr. Pearlean Brownie  3.  Follow up is anticipated in the next few months for levodopa challenge, sooner should new neurologic issues arise.  Much greater than 50% of this visit was spent in counseling and coordinating care.  Total face to face time:  25 min   Cc:  Kirby Funk, MD

## 2018-12-06 ENCOUNTER — Other Ambulatory Visit: Payer: Self-pay

## 2018-12-06 ENCOUNTER — Encounter: Payer: Self-pay | Admitting: Neurology

## 2018-12-06 ENCOUNTER — Ambulatory Visit: Payer: Medicare Other | Admitting: Neurology

## 2018-12-06 VITALS — BP 130/88 | HR 76 | Temp 98.2°F | Ht 70.0 in | Wt 200.0 lb

## 2018-12-06 DIAGNOSIS — F0280 Dementia in other diseases classified elsewhere without behavioral disturbance: Secondary | ICD-10-CM

## 2018-12-06 DIAGNOSIS — G3101 Pick's disease: Secondary | ICD-10-CM

## 2018-12-06 MED ORDER — CARBIDOPA-LEVODOPA 25-100 MG PO TABS: 1.0000 | ORAL_TABLET | Freq: Three times a day (TID) | ORAL | 1 refills | Status: DC

## 2018-12-06 NOTE — Patient Instructions (Addendum)
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  We have you scheduled for an on/off test on June 17th. Stay off Carbidopa Levodopa (Sinemet) 24 hours prior to appointment.

## 2019-01-10 ENCOUNTER — Ambulatory Visit (HOSPITAL_COMMUNITY)
Admission: EM | Admit: 2019-01-10 | Discharge: 2019-01-11 | Disposition: A | Discharge status: Home / Self Care | Payer: Medicare Other | Attending: Emergency Medicine | Admitting: Emergency Medicine

## 2019-01-10 ENCOUNTER — Encounter (HOSPITAL_COMMUNITY): Payer: Self-pay | Admitting: Emergency Medicine

## 2019-01-10 ENCOUNTER — Emergency Department (HOSPITAL_COMMUNITY): Payer: Medicare Other

## 2019-01-10 ENCOUNTER — Other Ambulatory Visit: Payer: Self-pay

## 2019-01-10 DIAGNOSIS — E785 Hyperlipidemia, unspecified: Secondary | ICD-10-CM | POA: Insufficient documentation

## 2019-01-10 DIAGNOSIS — T18128A Food in esophagus causing other injury, initial encounter: Secondary | ICD-10-CM | POA: Diagnosis not present

## 2019-01-10 DIAGNOSIS — I10 Essential (primary) hypertension: Secondary | ICD-10-CM | POA: Diagnosis not present

## 2019-01-10 DIAGNOSIS — H409 Unspecified glaucoma: Secondary | ICD-10-CM | POA: Diagnosis not present

## 2019-01-10 DIAGNOSIS — Z7982 Long term (current) use of aspirin: Secondary | ICD-10-CM | POA: Diagnosis not present

## 2019-01-10 DIAGNOSIS — X58XXXA Exposure to other specified factors, initial encounter: Secondary | ICD-10-CM | POA: Diagnosis not present

## 2019-01-10 DIAGNOSIS — K449 Diaphragmatic hernia without obstruction or gangrene: Secondary | ICD-10-CM | POA: Diagnosis not present

## 2019-01-10 DIAGNOSIS — K222 Esophageal obstruction: Secondary | ICD-10-CM | POA: Insufficient documentation

## 2019-01-10 DIAGNOSIS — K219 Gastro-esophageal reflux disease without esophagitis: Secondary | ICD-10-CM | POA: Diagnosis not present

## 2019-01-10 DIAGNOSIS — Z79899 Other long term (current) drug therapy: Secondary | ICD-10-CM | POA: Diagnosis not present

## 2019-01-10 DIAGNOSIS — E119 Type 2 diabetes mellitus without complications: Secondary | ICD-10-CM | POA: Diagnosis not present

## 2019-01-10 DIAGNOSIS — Z7984 Long term (current) use of oral hypoglycemic drugs: Secondary | ICD-10-CM | POA: Insufficient documentation

## 2019-01-10 DIAGNOSIS — F0390 Unspecified dementia without behavioral disturbance: Secondary | ICD-10-CM | POA: Insufficient documentation

## 2019-01-10 DIAGNOSIS — R509 Fever, unspecified: Secondary | ICD-10-CM | POA: Diagnosis not present

## 2019-01-10 DIAGNOSIS — Z20828 Contact with and (suspected) exposure to other viral communicable diseases: Secondary | ICD-10-CM | POA: Diagnosis not present

## 2019-01-10 DIAGNOSIS — R1310 Dysphagia, unspecified: Secondary | ICD-10-CM | POA: Diagnosis not present

## 2019-01-10 DIAGNOSIS — J9811 Atelectasis: Secondary | ICD-10-CM | POA: Diagnosis not present

## 2019-01-10 DIAGNOSIS — H5462 Unqualified visual loss, left eye, normal vision right eye: Secondary | ICD-10-CM | POA: Insufficient documentation

## 2019-01-10 DIAGNOSIS — M170 Bilateral primary osteoarthritis of knee: Secondary | ICD-10-CM | POA: Diagnosis not present

## 2019-01-10 DIAGNOSIS — F329 Major depressive disorder, single episode, unspecified: Secondary | ICD-10-CM | POA: Diagnosis not present

## 2019-01-10 DIAGNOSIS — R0902 Hypoxemia: Secondary | ICD-10-CM | POA: Diagnosis not present

## 2019-01-10 MED ORDER — GLUCAGON HCL RDNA (DIAGNOSTIC) 1 MG IJ SOLR
1.0000 mg | Freq: Once | INTRAMUSCULAR | Status: AC
  Administered 2019-01-11: 01:00:00 1 mg via INTRAVENOUS
  Filled 2019-01-10: qty 1

## 2019-01-10 MED ORDER — SODIUM CHLORIDE 0.9 % IV SOLN
INTRAVENOUS | Status: DC
  Administered 2019-01-11: via INTRAVENOUS

## 2019-01-10 MED ORDER — ACETAMINOPHEN 650 MG RE SUPP
650.0000 mg | Freq: Once | RECTAL | Status: AC
  Administered 2019-01-11: 650 mg via RECTAL
  Filled 2019-01-10: qty 1

## 2019-01-10 NOTE — ED Provider Notes (Signed)
TIME SEEN: 11:39 PM  CHIEF COMPLAINT: Food impaction  HPI: Patient is a 78 year old male with history of hypertension, hyperlipidemia, stroke on Aggrenox, diabetes, dementia who presents to the emergency department with esophageal food bolus.  Patient states that he was eating a steak around 7 PM when he felt to get stuck.  Has not been able to swallow anything since.  History is very limited as patient has dementia.  He denies any known fever but tells me he has had a cough but cannot tell me how long.  No vomiting or diarrhea.  ROS: Level 5 caveat secondary to dementia  PAST MEDICAL HISTORY/PAST SURGICAL HISTORY:  Past Medical History:  Diagnosis Date  . Arthritis    "knees" (09/14/2018)  . Blind left eye   . Chronic bronchitis (HCC)   . Dementia (HCC)   . Depression   . GERD (gastroesophageal reflux disease)   . Glaucoma   . History of kidney stones   . Hyperlipidemia   . Hypertension   . Pneumonia    "maybe twice" (09/14/2018)  . Recurrent UTI (urinary tract infection)   . Stroke [facility]) 11/2011   "didn't effect him at the time" (09/14/2018)  . Type II diabetes mellitus (HCC)     MEDICATIONS:  Prior to Admission medications   Medication Sig Start Date End Date Taking? Authorizing Provider  atorvastatin (LIPITOR) 40 MG tablet Take 40 mg by mouth at bedtime.     [provider]  benazepril (LOTENSIN) 10 MG tablet Take 10 mg by mouth daily.    [provider]  brimonidine (ALPHAGAN) 0.2 % ophthalmic solution Place 1 drop into the right eye 2 (two) times daily.  02/25/18   [provider]  carbidopa-levodopa (SINEMET IR) 25-100 MG tablet Take 1 tablet by mouth 3 (three) times daily. 12/06/18   Tat, Octaviano Batty, DO  dipyridamole-aspirin (AGGRENOX) 200-25 MG 12hr capsule Take 1 capsule by mouth 2 (two) times daily.    [provider]  glimepiride (AMARYL) 4 MG tablet Take 4 mg by mouth daily.  06/12/16   [provider]  latanoprost  (XALATAN) 0.005 % ophthalmic solution Place 1 drop into the right eye at bedtime.  06/02/16   [provider]  memantine (NAMENDA) 10 MG tablet Take 1 tablet (10 mg total) by mouth 2 (two) times daily. 08/24/18   Micki Riley, MD  Multiple Vitamin (MULTIVITAMIN WITH MINERALS) TABS tablet Take 1 tablet by mouth daily.    [provider]  pantoprazole (PROTONIX) 20 MG tablet Take 20 mg by mouth daily.    [provider]  Semaglutide, 1 MG/DOSE, (OZEMPIC, 1 MG/DOSE,) 2 MG/1.5ML SOPN Inject 2 mg into the skin every Monday.    [provider]  sertraline (ZOLOFT) 100 MG tablet Take 1 tablet (100 mg total) by mouth daily. 03/02/17   Micki Riley, MD  timolol (TIMOPTIC) 0.5 % ophthalmic solution Place 1 drop into the right eye 2 (two) times daily.    [provider]    ALLERGIES:  No Known Allergies  SOCIAL HISTORY:  Social History   Tobacco Use  . Smoking status: Former Smoker    Packs/day: 1.00    Years: 20.00    Pack years: 20.00    Types: Cigarettes    Last attempt to quit: 1980    Years since quitting: 40.3  . Smokeless tobacco: Former Neurosurgeon    Quit date: 1985  Substance Use Topics  . Alcohol use: No  FAMILY HISTORY: Family History  Problem Relation Age of Onset  . Polycythemia Mother   . Lung cancer Father   . Other Brother        brain tumor  . Diabetes Daughter     EXAM: BP (!) 173/94   Pulse 94   Temp 100.1 F (37.8 C) (Rectal)   Resp 18   Ht  (1.803 m)   Wt 68 kg   SpO2 94%   BMI 20.92 kg/m  CONSTITUTIONAL: Alert and oriented to person and place, demented, in no distress, elderly HEAD: Normocephalic EYES: Conjunctivae clear, pupils appear equal, EOMI ENT: normal nose; moist mucous membranes, normal voice, patient is unable to swallow his own secretions and is drooling, no trismus NECK: Supple, no meningismus, no nuchal rigidity, no LAD  CARD: RRR; S1 and S2 appreciated; no murmurs, no clicks, no rubs, no  gallops RESP: Normal chest excursion without splinting or tachypnea; breath sounds clear and equal bilaterally; no wheezes, no rhonchi, no rales, no hypoxia or respiratory distress, speaking full sentences ABD/GI: Normal bowel sounds; non-distended; soft, non-tender, no rebound, no guarding, no peritoneal signs, no hepatosplenomegaly BACK:  The back appears normal  EXT: Normal ROM in all joints; non-tender to palpation; no edema; normal capillary refill; no cyanosis, no calf tenderness or swelling    SKIN: Normal color for age and race; warm; no rash NEURO: Moves all extremities equally PSYCH: The patient's mood and manner are appropriate. Grooming and personal hygiene are appropriate.  MEDICAL DECISION MAKING: Patient here with an esophageal food bolus.  Unfortunately patient has a low-grade fever here of 100.1.  He will need to have further testing and to be ruled out for coronavirus.  Will give glucagon, IV fluids for his esophageal food bolus.  If this is not successful, will contact GI.  I do not see any previous GI notes in our system or previous history of endoscopy.  ED PROGRESS: 12:00 AM  Spoke to patient's wife and daughter by phone.  They report that this happened after eating [employer]  They tried to give him applesauce, yogurt and coke at home without any relief.  States they were not aware that he has a low-grade fever.  Report he has had a dry cough which is not anything abnormal for him.  No other recent infectious symptoms.  Wife and daughter report that patient has not left the house.  Daughter reports that she has groceries delivered to her house and then brings them to the patient.  They have low concern for coronavirus at this time.  Rayna Sexton reports he has had "his esophagus stretched" 3 times in the past.  Last time was years ago.  She cannot remember what gastroenterologist group they have seen previously.  1:40 AM  Pt's work-up is unremarkable.  Chest x-ray shows no  sign of infection, urine shows no sign of infection and COVID-19 testing is negative.  Lactate normal.  No leukocytosis.  Patient no longer spitting after receiving IV glucagon.  He states he thinks he is feeling better.  Will fluid challenge patient.  Attempted to fluid challenge patient and he immediately brought the fluid back up.  Will discuss with GI on-call.  1:55 AM  Discussed with Dr. Russella Dar with gastroenterology who will call endoscopy team and see patient in the ED.  I have updated patient's daughter and wife by phone.  3:10 AM  GI at bedside.  6:10 AM  Pt awake and alert.  He is able  to drink without difficulty and he is tolerating his secretions.  He states he is "ready to go".  Able to ambulate with assistance.  Uses a walker at home.  I have updated patient's wife, Trudy, and she will tDamian Leavellake him home.  We have discussed the importance of isolation given he has low-grade fevers.  I recommended slowly advancing diet from a clear liquid diet to a soft diet over the next several days.  Discussed return precautions.  They are comfortable with plan.   At this time, I do not feel there is any life-threatening condition present. I have reviewed and discussed all results (EKG, imaging, lab, urine as appropriate) and exam findings with patient/family. I have reviewed nursing notes and appropriate previous records.  I feel the patient is safe to be discharged home without further emergent workup and can continue workup as an outpatient as needed. Discussed usual and customary return precautions. Patient/family verbalize understanding and are comfortable with this plan.  Outpatient follow-up has been provided as needed. All questions have been answered.    Braylen Denunzio, Layla MawKristen N, DO 01/11/19 [phone]

## 2019-01-10 NOTE — ED Triage Notes (Addendum)
[facility]- pt here from home. Pt was eating dinner and thought his steak didn't go down correctly. Pt has history of acid reflux. Pt did not take any meds today (acid ref;ux meds, BP meds, insulin). Pt lives with family. Pt has been "spitting up" saying his throat is burning. No other complaints at this time. Pt has hx of dementia    100.0 temp 168/102 BP  243 CBG

## 2019-01-10 NOTE — ED Notes (Signed)
[phone] pts wife [name] states that pt has dementia, call for pt needs

## 2019-01-11 ENCOUNTER — Telehealth: Payer: Self-pay

## 2019-01-11 ENCOUNTER — Encounter (HOSPITAL_COMMUNITY): Payer: Self-pay | Admitting: Gastroenterology

## 2019-01-11 ENCOUNTER — Encounter (HOSPITAL_COMMUNITY)
Admission: EM | Disposition: A | Discharge status: Home / Self Care | Payer: Self-pay | Source: Home / Self Care | Attending: Emergency Medicine

## 2019-01-11 DIAGNOSIS — T18128A Food in esophagus causing other injury, initial encounter: Secondary | ICD-10-CM | POA: Diagnosis not present

## 2019-01-11 DIAGNOSIS — J9811 Atelectasis: Secondary | ICD-10-CM | POA: Diagnosis not present

## 2019-01-11 DIAGNOSIS — R1310 Dysphagia, unspecified: Secondary | ICD-10-CM | POA: Diagnosis not present

## 2019-01-11 DIAGNOSIS — K222 Esophageal obstruction: Secondary | ICD-10-CM | POA: Diagnosis not present

## 2019-01-11 HISTORY — PX: FOREIGN BODY REMOVAL: SHX962

## 2019-01-11 HISTORY — PX: ESOPHAGOGASTRODUODENOSCOPY: SHX5428

## 2019-01-11 LAB — SARS CORONAVIRUS 2 BY RT PCR (HOSPITAL ORDER, PERFORMED IN ~~LOC~~ HOSPITAL LAB): SARS Coronavirus 2: NEGATIVE

## 2019-01-11 LAB — CBC WITH DIFFERENTIAL/PLATELET
Abs Immature Granulocytes: 0.01 10*3/uL (ref 0.00–0.07)
Basophils Absolute: 0 10*3/uL (ref 0.0–0.1)
Basophils Relative: 1 %
Eosinophils Absolute: 0.1 10*3/uL (ref 0.0–0.5)
Eosinophils Relative: 2 %
HCT: 46.3 % (ref 39.0–52.0)
Hemoglobin: 14.2 g/dL (ref 13.0–17.0)
Immature Granulocytes: 0 %
Lymphocytes Relative: 15 %
Lymphs Abs: 0.9 10*3/uL (ref 0.7–4.0)
MCH: 27.6 pg (ref 26.0–34.0)
MCHC: 30.7 g/dL (ref 30.0–36.0)
MCV: 89.9 fL (ref 80.0–100.0)
Monocytes Absolute: 0.5 10*3/uL (ref 0.1–1.0)
Monocytes Relative: 8 %
Neutro Abs: 4.6 10*3/uL (ref 1.7–7.7)
Neutrophils Relative %: 74 %
Platelets: 205 10*3/uL (ref 150–400)
RBC: 5.15 MIL/uL (ref 4.22–5.81)
RDW: 13.8 % (ref 11.5–15.5)
WBC: 6.2 10*3/uL (ref 4.0–10.5)
nRBC: 0 % (ref 0.0–0.2)

## 2019-01-11 LAB — COMPREHENSIVE METABOLIC PANEL
ALT: 25 U/L (ref 0–44)
AST: 21 U/L (ref 15–41)
Albumin: 4.2 g/dL (ref 3.5–5.0)
Alkaline Phosphatase: 102 U/L (ref 38–126)
Anion gap: 10 (ref 5–15)
BUN: 18 mg/dL (ref 8–23)
CO2: 27 mmol/L (ref 22–32)
Calcium: 9.4 mg/dL (ref 8.9–10.3)
Chloride: 105 mmol/L (ref 98–111)
Creatinine, Ser: 1.24 mg/dL (ref 0.61–1.24)
GFR calc Af Amer: >60 mL/min (ref >60)
GFR calc non Af Amer: 56 mL/min — ABNORMAL LOW (ref >60)
Glucose, Bld: 160 mg/dL — ABNORMAL HIGH (ref 70–99)
Potassium: 4.2 mmol/L (ref 3.5–5.1)
Sodium: 142 mmol/L (ref 135–145)
Total Bilirubin: 1.8 mg/dL — ABNORMAL HIGH (ref 0.3–1.2)
Total Protein: 7.1 g/dL (ref 6.5–8.1)

## 2019-01-11 LAB — URINALYSIS, ROUTINE W REFLEX MICROSCOPIC
Bilirubin Urine: NEGATIVE
Glucose, UA: NEGATIVE mg/dL
Hgb urine dipstick: NEGATIVE
Ketones, ur: NEGATIVE mg/dL
Leukocytes,Ua: NEGATIVE
Nitrite: NEGATIVE
Protein, ur: 100 mg/dL — AB
Specific Gravity, Urine: 1.021 (ref 1.005–1.030)
pH: 5 (ref 5.0–8.0)

## 2019-01-11 LAB — LACTIC ACID, PLASMA: Lactic Acid, Venous: 1.4 mmol/L (ref 0.5–1.9)

## 2019-01-11 LAB — URINE CULTURE

## 2019-01-11 IMAGING — DX DG CHEST 1V PORT
1 series · 1 of 1 positions shown · non-contrast
Comparison: Chest radiograph March 03, 2018.

CLINICAL DATA: Shortness of breath for a week, hypoxia.

EXAM:
PORTABLE CHEST 1 VIEW

[chest ap]
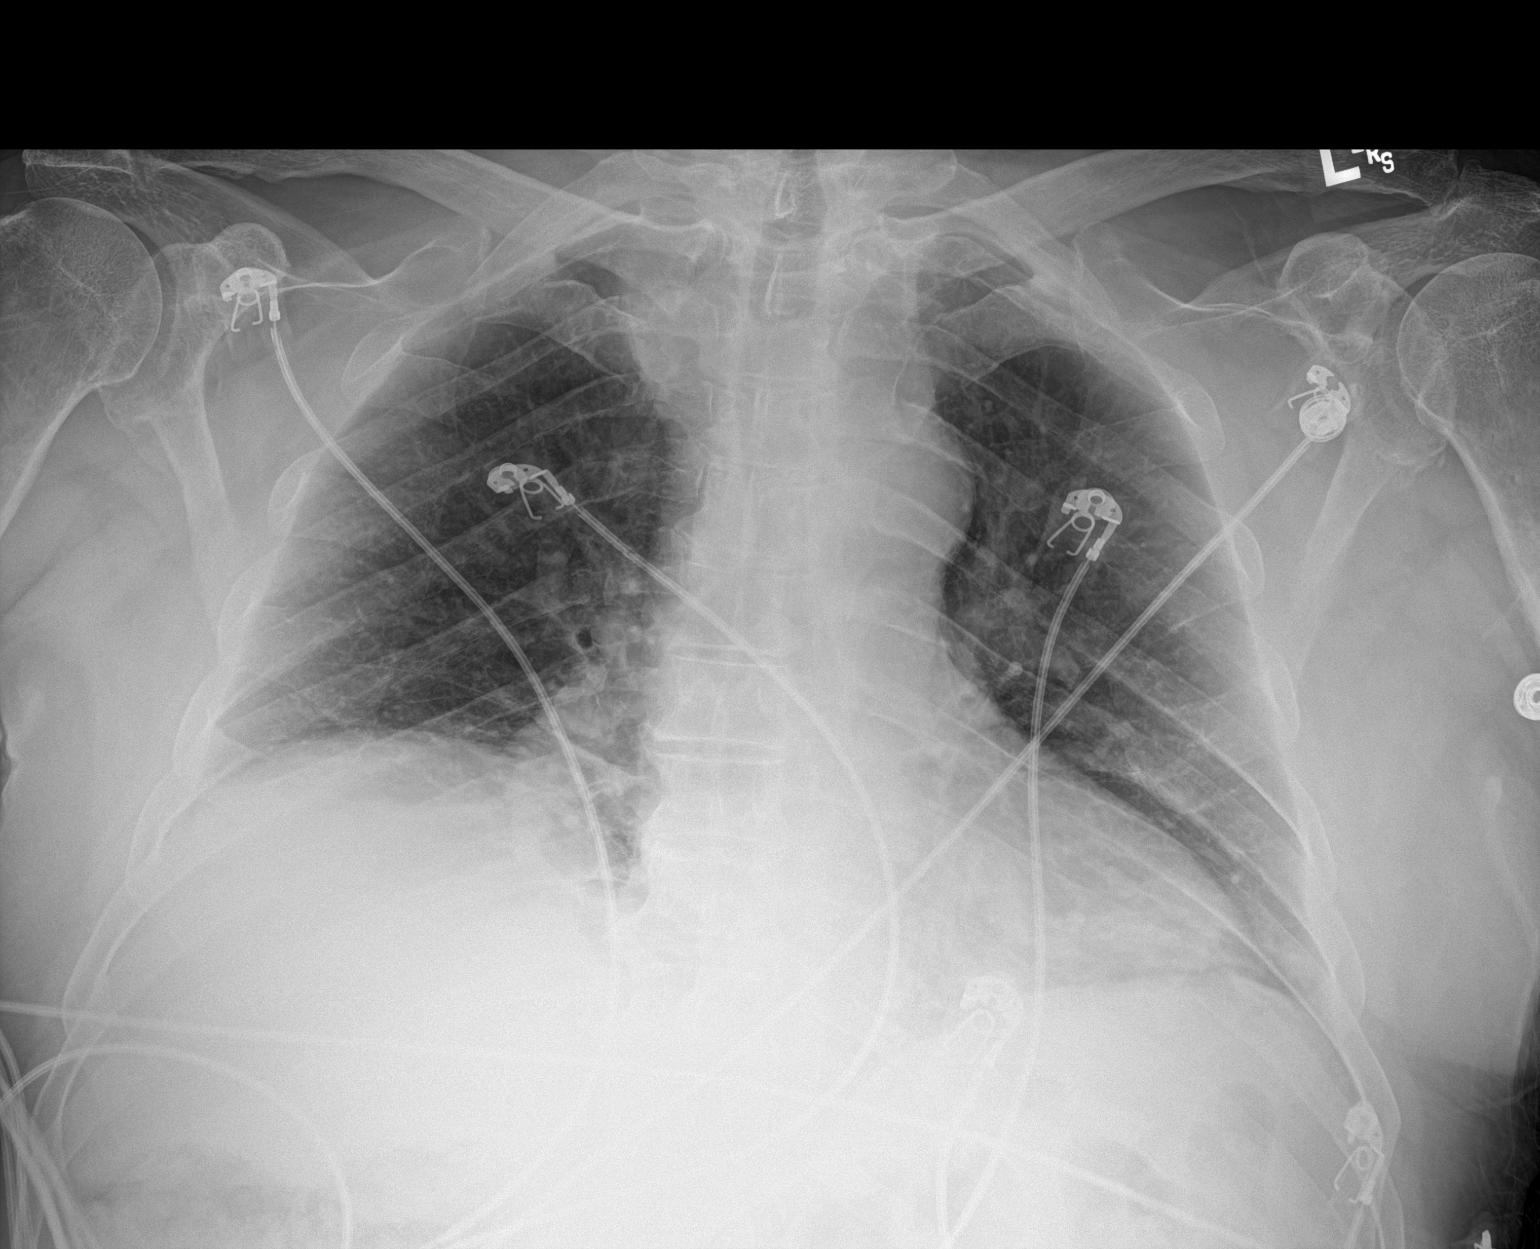

[1 of 1 positions shown; findings below may reference images not displayed]

FINDINGS: Cardiomediastinal silhouette is normal. Similar fullness of the
hila. Persistently elevated RIGHT hemidiaphragm with bibasilar
strandy densities. No pleural effusion or focal consolidation.
Punctate granuloma LEFT lung base. Biapical pleural thickening. No
pneumothorax. Soft tissue planes and included osseous structures are
non suspicious.
IMPRESSION: 1. Bibasilar atelectasis.

## 2019-01-11 SURGERY — EGD (ESOPHAGOGASTRODUODENOSCOPY)
Anesthesia: Moderate Sedation

## 2019-01-11 MED ORDER — FENTANYL CITRATE (PF) 100 MCG/2ML IJ SOLN
INTRAMUSCULAR | Status: AC
  Filled 2019-01-11: qty 4

## 2019-01-11 MED ORDER — FENTANYL CITRATE (PF) 100 MCG/2ML IJ SOLN
INTRAMUSCULAR | Status: DC | PRN
  Administered 2019-01-11 (×3): 25 ug via INTRAVENOUS

## 2019-01-11 MED ORDER — MIDAZOLAM HCL (PF) 10 MG/2ML IJ SOLN
INTRAMUSCULAR | Status: DC | PRN
  Administered 2019-01-11: 1 mg via INTRAVENOUS
  Administered 2019-01-11 (×2): 2 mg via INTRAVENOUS

## 2019-01-11 MED ORDER — BUTAMBEN-TETRACAINE-BENZOCAINE 2-2-14 % EX AERO
INHALATION_SPRAY | CUTANEOUS | Status: DC | PRN
  Administered 2019-01-11: 2 via TOPICAL

## 2019-01-11 MED ORDER — MIDAZOLAM HCL (PF) 5 MG/ML IJ SOLN
INTRAMUSCULAR | Status: AC
  Filled 2019-01-11: qty 3

## 2019-01-11 MED ORDER — DIPHENHYDRAMINE HCL 50 MG/ML IJ SOLN
INTRAMUSCULAR | Status: AC
  Filled 2019-01-11: qty 1

## 2019-01-11 NOTE — ED Notes (Signed)
Call pts wife when he is being taken to the waiting room, wife waiting outside for pt [phone]

## 2019-01-11 NOTE — H&P (Signed)
See EDP note.

## 2019-01-11 NOTE — Telephone Encounter (Signed)
-----   Message from Meryl Dare, MD sent at 01/11/2019  1:08 PM EDT ----- Lavonna Rua, Please see EGD report from earlier today. He needs EGD/dilation within the next 2 weeks for esophageal stricture off Aggrenox 5 days prior to EGD/dilation. Start ASA 81 mg during the days off Aggrenox. Please obtain clearance from prescribing provider which might be Dr. Arbutus Leas or Dr. Valentina Lucks. Aurea Graff should have an [facility] half day open for me during the first week of May. Please reinforce avoiding steak, chicken, bread until dilation. Thanks, MS

## 2019-01-11 NOTE — ED Notes (Signed)
Patient verbalizes understanding of discharge instructions. Opportunity for questioning and answers were provided. Armband removed by staff, pt discharged from ED in wheelchair.  

## 2019-01-11 NOTE — Telephone Encounter (Signed)
Dr. Valentina Lucks,  Dr. Russella Dar plans EGD with dilation for patient for s/p food impaction and dysphagia.  Please advise if he can come off of his Aggrenox for 5 days prior to the procedure and start 81 mg ASA while off Aggrenox.

## 2019-01-11 NOTE — Discharge Instructions (Addendum)
You may take Tylenol 1000 mg every 6 hours as needed for fever.  Your chest x-ray, urinalysis and coronavirus testing today were normal.  We have sent blood cultures.  If they are positive for bacteria, you will be called and need to return to the hospital.  You need to isolate yourself from others for at least 7 days after the onset of fever and need to be at least 3 days without fever before coming out of isolation.  GI saw you today for an esophageal food impaction.  They performed an endoscopy under sedation to help remove this food impaction.  I recommend that you have a liquid diet for the next 2 days and then slowly advance to a soft diet.  We recommend that you avoid any hard foods.  No meats, breads, hard foods until you have had your repeat endoscopy completed.  GI recommendations:  - Soft diet (no meat, bread, hard foods) until repeat EGD complete. - Continue present medications. - Schedule repeat EGD with dilation in 1-2 weeks.

## 2019-01-11 NOTE — Op Note (Addendum)
[facility] Patient Name: Daniel Morrow Procedure Date : 01/11/2019 MRN: [id number] Attending MD: Meryl Dare , MD Date of Birth: 1941-07-28 CSN: [id number] Age: 78 Admit Type: Emergency Department Procedure:                Upper GI endoscopy Indications:              Dysphagia, Foreign body in the esophagus. Food                            impaction while eating [place] steak about 6 pm                            and unable to handle secretions. Providers:                Venita Lick. Russella Dar, MD, Zoe Lan, RN, Lawson Radar, Technician Referring MD:             Rochele Raring, DO Medicines:                Fentanyl 75 micrograms IV, Midazolam 5 mg IV Complications:            No immediate complications. Estimated Blood Loss:     Estimated blood loss: none. Procedure:                Pre-Anesthesia Assessment:                           - Prior to the procedure, a History and Physical                            was performed, and patient medications and                            allergies were reviewed. The patient's tolerance of                            previous anesthesia was also reviewed. The risks                            and benefits of the procedure and the sedation                            options and risks were discussed with the patient.                            All questions were answered, and informed consent                            was obtained. Prior Anticoagulants: The patient has                            taken no previous anticoagulant or antiplatelet  agents. ASA Grade Assessment: III - A patient with                            severe systemic disease. After reviewing the risks                            and benefits, the patient was deemed in                            satisfactory condition to undergo the procedure.                           After obtaining informed consent, the  endoscope was                            passed under direct vision. Throughout the                            procedure, the patient's blood pressure, pulse, and                            oxygen saturations were monitored continuously. The                            GIF-H190 ([id number]) Olympus gastroscope was                            introduced through the mouth, and advanced to the                            second part of duodenum. The upper GI endoscopy was                            accomplished without difficulty. The patient                            tolerated the procedure well. Scope In: Scope Out: Findings:      Food, meat, was found in the distal esophagus. Removal of food was       accomplished by breaking it into small pieces with the 3 prong grasper       and then gently advancing and lavaging multiple small pieces into the       stomach.      Localized moderate mucosal changes, abrasions characterized by erythema,       friability (with contact bleeding) and granularity were found in the       distal esophagus at the site of the food impaction.      A single 3 mm mucosal nodule with a localized distribution was found in       the proximal esophagus, 19 cm from the incisors. Biopsies deferred to       repeat EGD.      One benign-appearing, intrinsic moderate stenosis was found 41 cm from       the incisors. This stenosis measured 1.2 cm (inner diameter). The       stenosis was  traversed.      The exam of the esophagus was otherwise normal.      A small hiatal hernia was present. Retained food and thick fluids in the       stomach obscured parts of the exam.      The exam of the stomach was otherwise normal.      The duodenal bulb and second portion of the duodenum were normal. Impression:               - Food in the distal esophagus. Removal was                            successful.                           - Erythematous, friable (with contact bleeding),                             granular mucosa in the esophagus.                           - Mucosal nodule found in the esophagus.                           - Benign-appearing esophageal stenosis.                           - Small hiatal hernia.                           - Normal duodenal bulb and second portion of the                            duodenum. Moderate Sedation:      Moderate (conscious) sedation was administered by the endoscopy nurse       and supervised by the endoscopist. The following parameters were       monitored: oxygen saturation, heart rate, blood pressure, respiratory       rate, EKG, adequacy of pulmonary ventilation, and response to care.       Total physician intraservice time was 15 minutes. Recommendation:           - Patient has a contact number available for                            emergencies. The signs and symptoms of potential                            delayed complications were discussed with the                            patient. Return to normal activities tomorrow.                            Written discharge instructions were provided to the                            patient.                           -  Soft diet (no meat, bread, hard foods) until                            repeat EGD complete.                           - Continue present medications.                           - Schedule repeat EGD with dilation in 1-2 weeks.                           - Above reviewed by phone with the patients wife in                            detail. Procedure Code(s):        --- Professional ---                           [id number], Esophagogastroduodenoscopy, flexible,                            transoral; with removal of foreign body(s) Diagnosis Code(s):        --- Professional ---                           U04.540JT18.128A, Food in esophagus causing other injury,                            initial encounter                           K22.8, Other specified diseases of  esophagus                           K22.2, Esophageal obstruction                           K44.9, Diaphragmatic hernia without obstruction or                            gangrene                           R13.10, Dysphagia, unspecified                           T18.108A, Unspecified foreign body in esophagus                            causing other injury, initial encounter CPT copyright 2019 [employer]. All rights reserved. The codes documented in this report are preliminary and upon coder review may  be revised to meet current compliance requirements. Meryl DareMalcolm T Amor Hyle, MD 01/11/2019 4:01:50 AM This report has been signed electronically. Number of Addenda: 0

## 2019-01-12 NOTE — Telephone Encounter (Signed)
As per my records patient has not had a stroke or TIA for more than 2 years.  He can come off the Aggrenox for 5 days prior to the procedure and switch to aspirin 81 with a small but acceptable periprocedural risk of TIA/stroke if patient is willing.

## 2019-01-12 NOTE — Telephone Encounter (Signed)
Dr. Pearlean Brownie,   Dr. Russella Dar plans for EGD with dilation for this patient for s/p food impaction and dysphagia.  Please advise if he can come off of his Aggrenox for 5 days prior and start 81 mg ASA while off Aggrenox.

## 2019-01-13 ENCOUNTER — Encounter (HOSPITAL_COMMUNITY): Payer: Self-pay | Admitting: Gastroenterology

## 2019-01-13 NOTE — Telephone Encounter (Signed)
Patient has been scheduled for a EGD dil on 01/27/19.  He will have virtual pre-visit on 01/20/19.  His wife is notified that he will need to start 81 mg ASA when he stops Aggrenox.  She understands that the last day to take Aggrenox is 01/21/19 and to begin 81 mg ASA on 01/22/19

## 2019-01-16 LAB — CULTURE, BLOOD (ROUTINE X 2)
Culture: NO GROWTH
Culture: NO GROWTH

## 2019-01-17 DIAGNOSIS — G3109 Other frontotemporal dementia: Secondary | ICD-10-CM | POA: Diagnosis not present

## 2019-01-17 DIAGNOSIS — E1122 Type 2 diabetes mellitus with diabetic chronic kidney disease: Secondary | ICD-10-CM | POA: Diagnosis not present

## 2019-01-17 DIAGNOSIS — I10 Essential (primary) hypertension: Secondary | ICD-10-CM | POA: Diagnosis not present

## 2019-01-17 DIAGNOSIS — E1142 Type 2 diabetes mellitus with diabetic polyneuropathy: Secondary | ICD-10-CM | POA: Diagnosis not present

## 2019-01-20 ENCOUNTER — Ambulatory Visit: Payer: Medicare Other | Admitting: *Deleted

## 2019-01-20 ENCOUNTER — Encounter: Payer: Self-pay | Admitting: Gastroenterology

## 2019-01-20 ENCOUNTER — Other Ambulatory Visit: Payer: Self-pay

## 2019-01-20 VITALS — Ht 70.0 in | Wt 195.0 lb

## 2019-01-20 DIAGNOSIS — R1310 Dysphagia, unspecified: Secondary | ICD-10-CM

## 2019-01-20 NOTE — Progress Notes (Signed)
Patient's name and birthday verified then pre-visit was done with pt's wife (POA)-patient has dementia. (Wife)Patient denies any allergies to eggs or soy,Patient denies any problems with anesthesia/sedation. Patient denies any oxygen use at home. Patient denies taking any diet/weight loss medications patient's wife aware when to stop blood thinner and start asa 81mg . Patient's instructions mailed to him today.

## 2019-01-25 ENCOUNTER — Telehealth: Payer: Self-pay | Admitting: *Deleted

## 2019-01-25 ENCOUNTER — Emergency Department (HOSPITAL_BASED_OUTPATIENT_CLINIC_OR_DEPARTMENT_OTHER): Payer: Medicare Other

## 2019-01-25 ENCOUNTER — Encounter (HOSPITAL_BASED_OUTPATIENT_CLINIC_OR_DEPARTMENT_OTHER): Payer: Self-pay | Admitting: Emergency Medicine

## 2019-01-25 ENCOUNTER — Emergency Department (HOSPITAL_BASED_OUTPATIENT_CLINIC_OR_DEPARTMENT_OTHER)
Admission: EM | Admit: 2019-01-25 | Discharge: 2019-01-25 | Disposition: A | Discharge status: Home / Self Care | Payer: Medicare Other | Attending: Emergency Medicine | Admitting: Emergency Medicine

## 2019-01-25 ENCOUNTER — Other Ambulatory Visit: Payer: Self-pay

## 2019-01-25 DIAGNOSIS — F0280 Dementia in other diseases classified elsewhere without behavioral disturbance: Secondary | ICD-10-CM | POA: Insufficient documentation

## 2019-01-25 DIAGNOSIS — Y939 Activity, unspecified: Secondary | ICD-10-CM | POA: Insufficient documentation

## 2019-01-25 DIAGNOSIS — Z79899 Other long term (current) drug therapy: Secondary | ICD-10-CM | POA: Diagnosis not present

## 2019-01-25 DIAGNOSIS — S0990XA Unspecified injury of head, initial encounter: Secondary | ICD-10-CM | POA: Diagnosis present

## 2019-01-25 DIAGNOSIS — W19XXXA Unspecified fall, initial encounter: Secondary | ICD-10-CM | POA: Insufficient documentation

## 2019-01-25 DIAGNOSIS — I10 Essential (primary) hypertension: Secondary | ICD-10-CM | POA: Insufficient documentation

## 2019-01-25 DIAGNOSIS — Y999 Unspecified external cause status: Secondary | ICD-10-CM | POA: Diagnosis not present

## 2019-01-25 DIAGNOSIS — Z7984 Long term (current) use of oral hypoglycemic drugs: Secondary | ICD-10-CM | POA: Insufficient documentation

## 2019-01-25 DIAGNOSIS — F0390 Unspecified dementia without behavioral disturbance: Secondary | ICD-10-CM | POA: Diagnosis not present

## 2019-01-25 DIAGNOSIS — Z7902 Long term (current) use of antithrombotics/antiplatelets: Secondary | ICD-10-CM | POA: Insufficient documentation

## 2019-01-25 DIAGNOSIS — Y929 Unspecified place or not applicable: Secondary | ICD-10-CM | POA: Insufficient documentation

## 2019-01-25 DIAGNOSIS — S0240FA Zygomatic fracture, left side, initial encounter for closed fracture: Secondary | ICD-10-CM | POA: Insufficient documentation

## 2019-01-25 DIAGNOSIS — Z87891 Personal history of nicotine dependence: Secondary | ICD-10-CM | POA: Diagnosis not present

## 2019-01-25 DIAGNOSIS — E119 Type 2 diabetes mellitus without complications: Secondary | ICD-10-CM | POA: Diagnosis not present

## 2019-01-25 DIAGNOSIS — S0219XA Other fracture of base of skull, initial encounter for closed fracture: Secondary | ICD-10-CM | POA: Insufficient documentation

## 2019-01-25 DIAGNOSIS — G309 Alzheimer's disease, unspecified: Secondary | ICD-10-CM | POA: Diagnosis not present

## 2019-01-25 IMAGING — CT CT HEAD WITHOUT CONTRAST
3 of 7 series · 15 of 47 positions shown, 18 images · non-contrast
Comparison: 09/12/2018

CLINICAL DATA: Fall, facial trauma, on anticoagulation.

EXAM:
CT HEAD WITHOUT CONTRAST
CT MAXILLOFACIAL WITHOUT CONTRAST
TECHNIQUE: Multidetector CT imaging of the head and maxillofacial structures
were performed using the standard protocol without intravenous
contrast. Multiplanar CT image reconstructions of the maxillofacial
structures were also generated.

[Series 4: cor head wo · coronal · 0.33mm/px · 3 of 70 slices shown]
[im 18/70  brain]
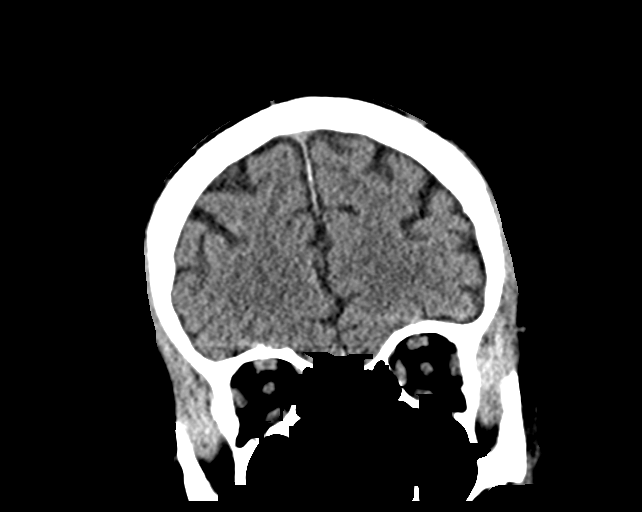
[im 35/70  brain]
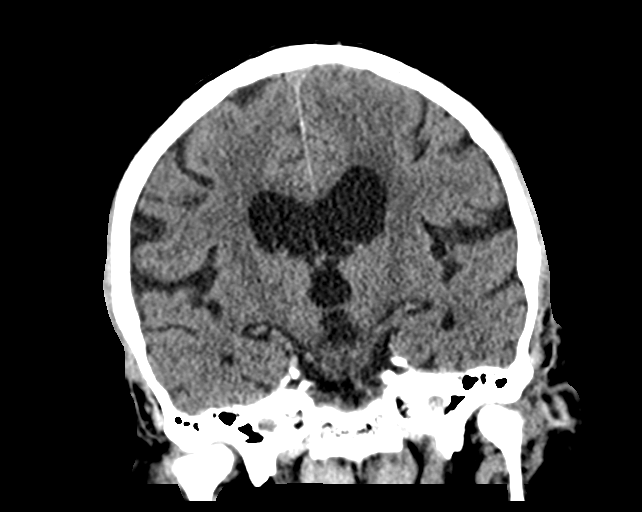
[im 52/70  brain]
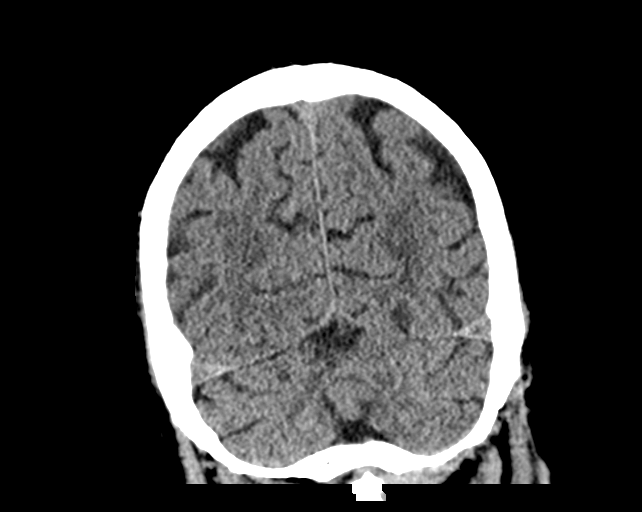

[Series 6: max soft · axial · 0.32mm/px · z∈[-294,-132]mm · 11 of 91 slices shown, 14 images]
[im 5/91  brain]
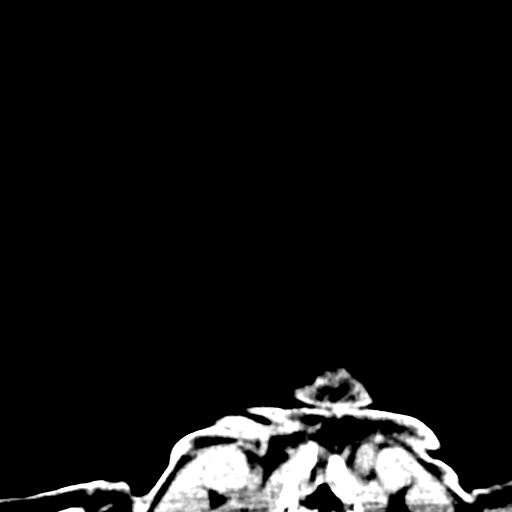
[im 5/91  bone]
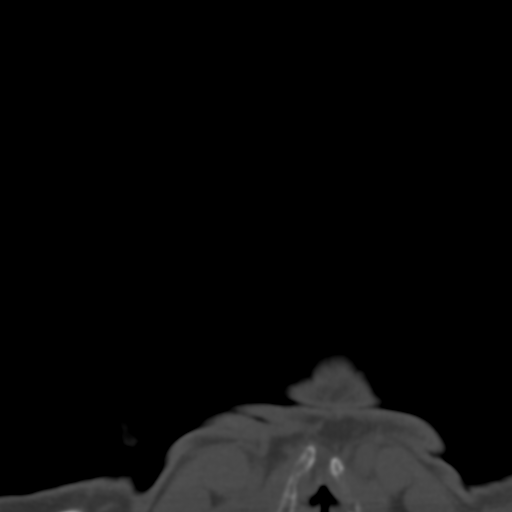
[im 13/91  brain]
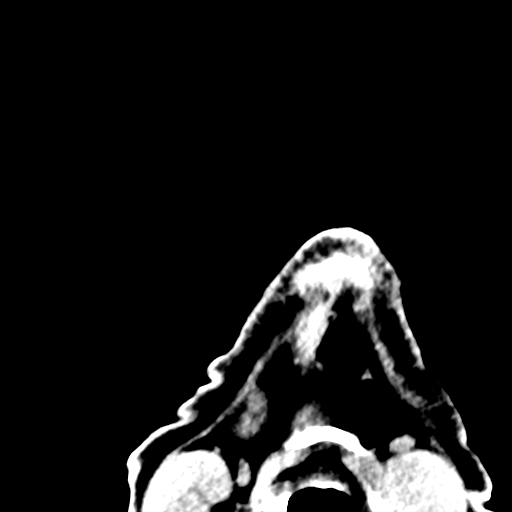
[im 22/91  brain]
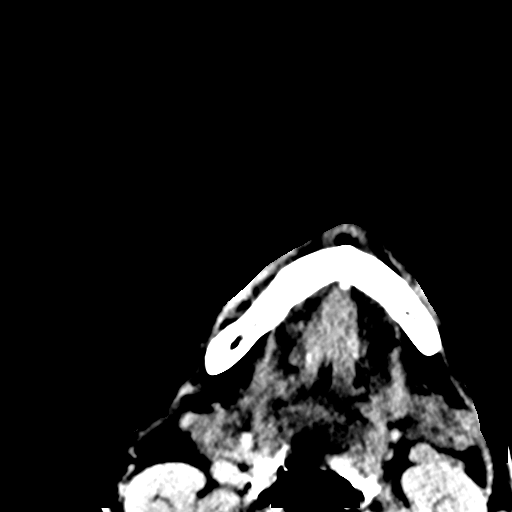
[im 31/91  brain]
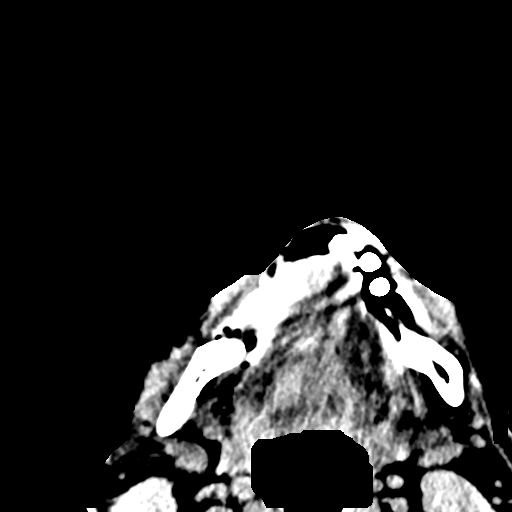
[im 39/91  brain]
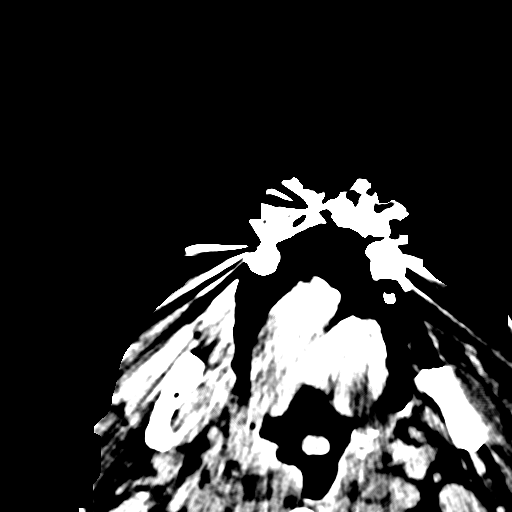
[im 39/91  bone]
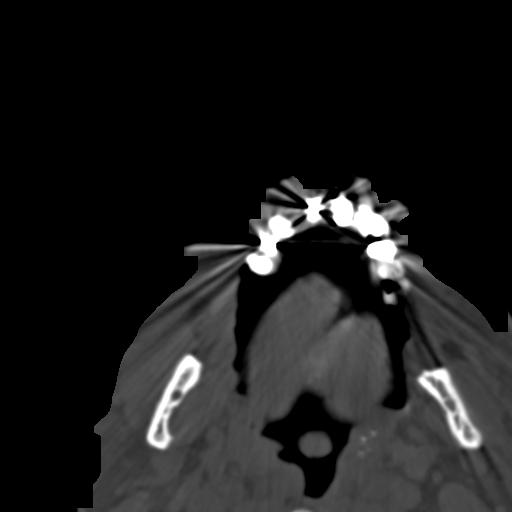
[im 48/91  brain]
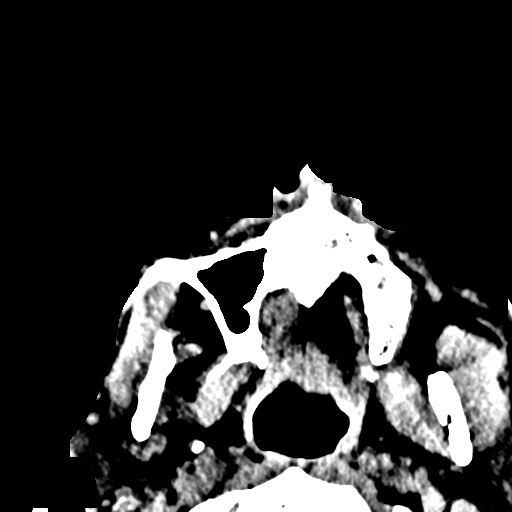
[im 52/91  brain]
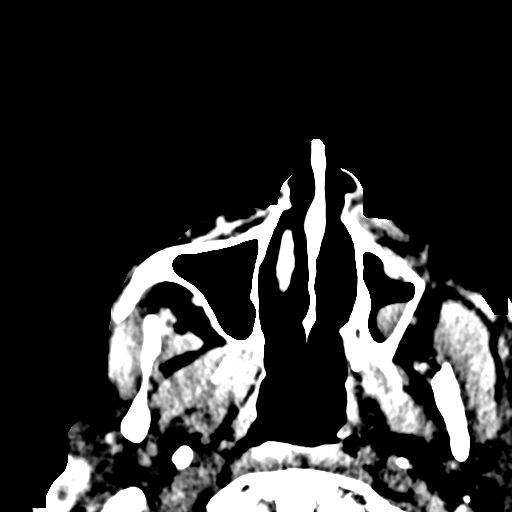
[im 61/91  brain]
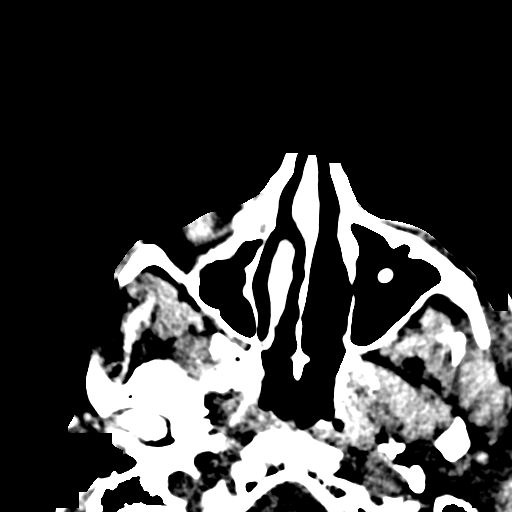
[im 69/91  brain]
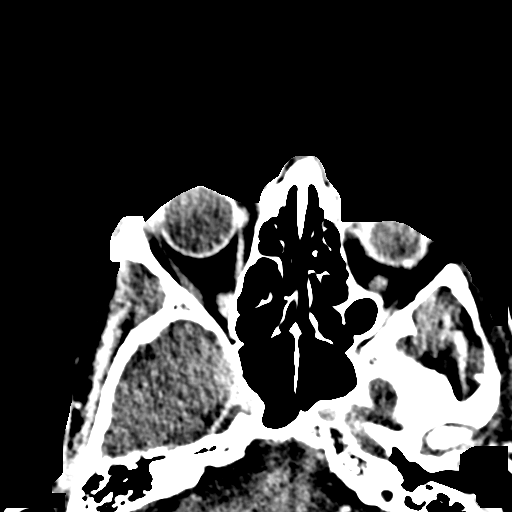
[im 69/91  bone]
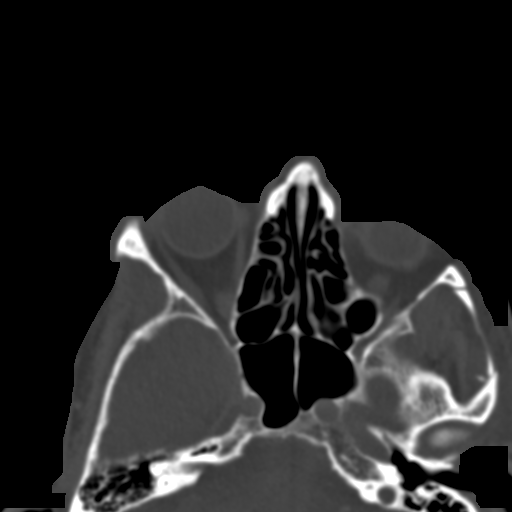
[im 78/91  brain]
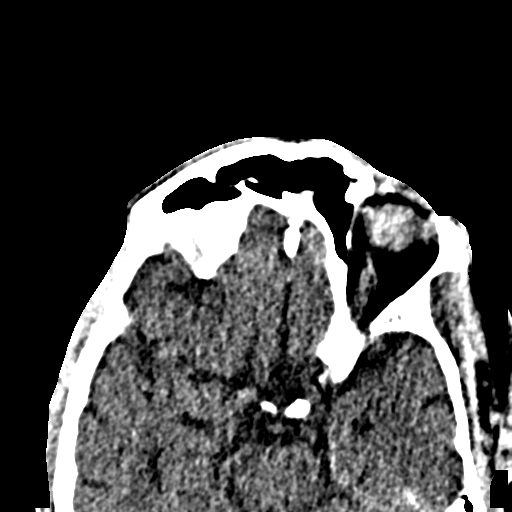
[im 86/91  brain]
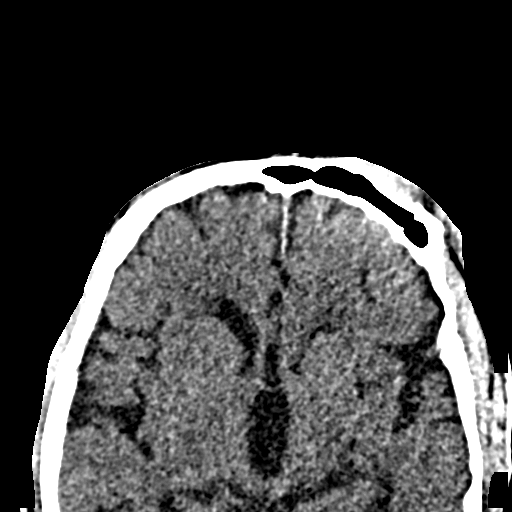

[Series 11: sagittal soft · sagittal · 0.37mm/px · 1 of 95 slices shown]
[im 48/95  brain]
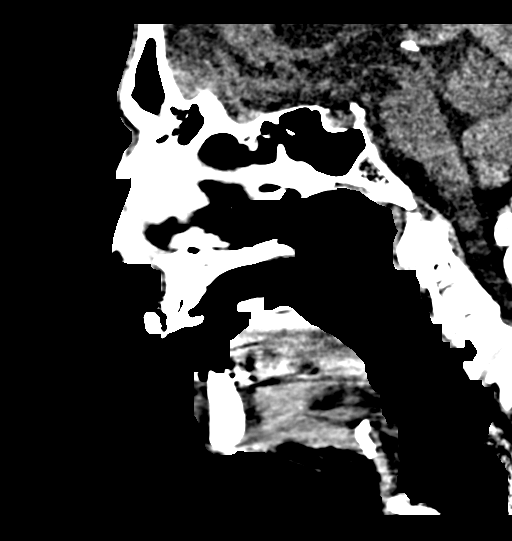

[15 of 47 positions shown; findings below may reference images not displayed]

FINDINGS: CT HEAD FINDINGS

Brain: No acute intracranial abnormality. Specifically, no
hemorrhage, hydrocephalus, mass lesion, acute infarction, or
significant intracranial injury. There is atrophy and chronic small
vessel disease changes.

Vascular: There is a fracture through the anterior wall of the left
frontal sinus with overlying soft tissue swelling. Fractures
minimally depressed.

Skull: No acute calvarial abnormality.

Other: Soft tissue swelling over the left forehead.

CT MAXILLOFACIAL FINDINGS

Osseous: Fractures through the anterior and posterior aspect of the
left zygomatic arch. Fractures through the anterior wall of the left
frontal sinus as described above.

Orbits: Negative. No traumatic or inflammatory finding.

Sinuses: Mucosal thickening in the maxillary sinuses and frontal
sinuses. No air-fluid levels.

Soft tissues: Soft tissue swelling over the left forehead and
orbital region.
IMPRESSION: No acute intracranial abnormality.

Fractures through the anterior and posterior aspect of the left
zygomatic arch.

Minimally depressed fracture through the anterior wall of the left
frontal sinus.

## 2019-01-25 NOTE — Telephone Encounter (Signed)
Second attempt to screen the patient and was unable to reach him. SM

## 2019-01-25 NOTE — ED Provider Notes (Signed)
[facility] EMERGENCY DEPARTMENT Provider Note   CSN: [id number] Arrival date & time: 01/25/19  2115    History   Chief Complaint Chief Complaint  Patient presents with   Fall    HPI Daniel Morrow is a 78 y.o. male.    Level 5 caveat due to dementia. HPI Patient with 2 falls.  Had 1 tonight and had another 3 days ago.  Hit his head with abrasion on his forehead.  He is on Aggrenox.  Baseline dementia and is not more confused.  Falls are not unusual for him.  No fevers.  No chills.  No dysuria.  No other extremity injury.  Does have a black eye on the left side reportedly came from the fall 3 days ago.  No neck pain.  Patient's wife discussed with PCP and was told to come in for further evaluation. Past Medical History:  Diagnosis Date   Arthritis    "knees" (09/14/2018)   Blind left eye    Chronic bronchitis (HCC)    Dementia (HCC)    Depression    GERD (gastroesophageal reflux disease)    Glaucoma    History of kidney stones    Hyperlipidemia    Hypertension    Pneumonia    "maybe twice" (09/14/2018)   Recurrent UTI (urinary tract infection)    Stroke (HCC) 11/2011   "didn't effect him at the time" (09/14/2018)   Type II diabetes mellitus [facility])     Patient Active Problem List   Diagnosis Date Noted   Shortness of breath at rest 09/12/2018   Shortness of breath 09/12/2018   Vision, loss, sudden 06/03/2016   CRAO (central retinal artery occlusion) 06/03/2016   Alzheimer's disease (HCC) 01/16/2016   Epididymo-orchitis 12/02/2013   Orchitis and epididymitis 12/02/2013   Mild cognitive impairment, so stated 02/17/2013   CVA (cerebral infarction) 02/17/2013   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 02/17/2013   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) 04/03/2012   Hypertension 04/03/2012   Diabetes mellitus (HCC) 04/03/2012   Hyperlipidemia     Past Surgical  History:  Procedure Laterality Date   ABDOMINAL HERNIA REPAIR     BACK SURGERY     CATARACT EXTRACTION W/ INTRAOCULAR LENS  IMPLANT, BILATERAL Bilateral    COLONOSCOPY     ESOPHAGOGASTRODUODENOSCOPY N/A 01/11/2019   Procedure: ESOPHAGOGASTRODUODENOSCOPY (EGD);  Surgeon: Meryl Dare, MD;  Location: [facility] ENDOSCOPY;  Service: Endoscopy;  Laterality: N/A;   ESOPHAGOGASTRODUODENOSCOPY (EGD) WITH ESOPHAGEAL DILATION  X 2   FOREIGN BODY REMOVAL  01/11/2019   Procedure: FOREIGN BODY REMOVAL;  Surgeon: Meryl Dare, MD;  Location: [facility] ENDOSCOPY;  Service: Endoscopy;;   FRACTURE SURGERY     HERNIA REPAIR     PATELLA FRACTURE SURGERY Left    POSTERIOR CERVICAL FUSION/FORAMINOTOMY          Home Medications    Prior to Admission medications   Medication Sig Start Date End Date Taking? Authorizing Provider  atorvastatin (LIPITOR) 40 MG tablet Take 40 mg by mouth at bedtime.     [provider]  benazepril (LOTENSIN) 10 MG tablet Take 10 mg by mouth daily.    [provider]  brimonidine (ALPHAGAN) 0.2 % ophthalmic solution Place 1 drop into the right eye 2 (two) times daily.  02/25/18   [provider]  carbidopa-levodopa (SINEMET IR) 25-100 MG tablet Take 1 tablet by mouth 3 (three) times daily. 12/06/18   Tat, Octaviano Batty,  DO  colesevelam (WELCHOL) 625 MG tablet 1,875 mg 2 (two) times daily with a meal.  12/22/18   [provider]  dipyridamole-aspirin (AGGRENOX) 200-25 MG 12hr capsule Take 1 capsule by mouth 2 (two) times daily.    [provider]  glimepiride (AMARYL) 4 MG tablet Take 4 mg by mouth daily.  06/12/16   [provider]  latanoprost (XALATAN) 0.005 % ophthalmic solution Place 1 drop into the right eye at bedtime.  06/02/16   [provider]  memantine (NAMENDA) 10 MG tablet Take 1 tablet (10 mg total) by mouth 2 (two) times daily. 08/24/18   Micki Riley, MD  Multiple Vitamin (MULTIVITAMIN WITH MINERALS) TABS  tablet Take 1 tablet by mouth daily.    [provider]  pantoprazole (PROTONIX) 20 MG tablet Take 20 mg by mouth daily.    [provider]  Semaglutide, 1 MG/DOSE, (OZEMPIC, 1 MG/DOSE,) 2 MG/1.5ML SOPN Inject 2 mg into the skin every Monday.    [provider]  sertraline (ZOLOFT) 100 MG tablet Take 1 tablet (100 mg total) by mouth daily. 03/02/17   Micki Riley, MD  timolol (TIMOPTIC) 0.5 % ophthalmic solution Place 1 drop into the right eye 2 (two) times daily.    [provider]    Family History Family History  Problem Relation Age of Onset   Polycythemia Mother    Lung cancer Father    Other Brother        brain tumor   Diabetes Daughter    Colon cancer Neg Hx    Esophageal cancer Neg Hx    Rectal cancer Neg Hx    Stomach cancer Neg Hx     Social History Social History   Tobacco Use   Smoking status: Former Smoker    Packs/day: 1.00    Years: 20.00    Pack years: 20.00    Types: Cigarettes    Last attempt to quit: 1980    Years since quitting: 40.3   Smokeless tobacco: Former Neurosurgeon    Quit date: 1985  Substance Use Topics   Alcohol use: No   Drug use: Never     Allergies   Patient has no known allergies.   Review of Systems Review of Systems  Unable to perform ROS: Dementia     Physical Exam Updated Vital Signs BP 137/77    Pulse 76    Temp 98.2 F (36.8 C) (Oral)    Resp 16    Ht 5\' 10"  (1.778 m)    Wt 88.5 kg    SpO2 96%    BMI 27.98 kg/m   Physical Exam Vitals signs and nursing note reviewed.  HENT:     Head:     Comments: 2 separate areas of abrasion to left forehead.  Mild abrasion on left lateral ear with normal TM.  Periorbital ecchymosis on left side.  Eye movements intact.  No jaw tenderness. Eyes:     Extraocular Movements: Extraocular movements intact.  Neck:     Musculoskeletal: Neck supple.     Comments: No midline tenderness and painless range of motion. Chest:     Chest wall: No  tenderness.  Abdominal:     Tenderness: There is no abdominal tenderness.  Musculoskeletal:        General: No tenderness or deformity.     Right lower leg: No edema.     Left lower leg: No edema.  Skin:    General: Skin is warm.  Neurological:     Mental Status: He is alert. Mental status is at baseline.      ED Treatments / Results  Labs (all labs ordered are listed, but only abnormal results are displayed) Labs Reviewed - No data to display  EKG None  Radiology Ct Head Wo Contrast  Result Date: 01/25/2019 CLINICAL DATA:  Fall, facial trauma, on anticoagulation. EXAM: CT HEAD WITHOUT CONTRAST CT MAXILLOFACIAL WITHOUT CONTRAST TECHNIQUE: Multidetector CT imaging of the head and maxillofacial structures were performed using the standard protocol without intravenous contrast. Multiplanar CT image reconstructions of the maxillofacial structures were also generated. COMPARISON:  09/12/2018 FINDINGS: CT HEAD FINDINGS Brain: No acute intracranial abnormality. Specifically, no hemorrhage, hydrocephalus, mass lesion, acute infarction, or significant intracranial injury. There is atrophy and chronic small vessel disease changes. Vascular: There is a fracture through the anterior wall of the left frontal sinus with overlying soft tissue swelling. Fractures minimally depressed. Skull: No acute calvarial abnormality. Other: Soft tissue swelling over the left forehead. CT MAXILLOFACIAL FINDINGS Osseous: Fractures through the anterior and posterior aspect of the left zygomatic arch. Fractures through the anterior wall of the left frontal sinus as described above. Orbits: Negative. No traumatic or inflammatory finding. Sinuses: Mucosal thickening in the maxillary sinuses and frontal sinuses. No air-fluid levels. Soft tissues: Soft tissue swelling over the left forehead and orbital region. IMPRESSION: No acute intracranial abnormality. Fractures through the anterior and posterior aspect of the left  zygomatic arch. Minimally depressed fracture through the anterior wall of the left frontal sinus. Electronically Signed   By: Charlett NoseKevin  Dover M.D.   On: 01/25/2019 22:48   Ct Maxillofacial Wo Contrast  Result Date: 01/25/2019 CLINICAL DATA:  Fall, facial trauma, on anticoagulation. EXAM: CT HEAD WITHOUT CONTRAST CT MAXILLOFACIAL WITHOUT CONTRAST TECHNIQUE: Multidetector CT imaging of the head and maxillofacial structures were performed using the standard protocol without intravenous contrast. Multiplanar CT image reconstructions of the maxillofacial structures were also generated. COMPARISON:  09/12/2018 FINDINGS: CT HEAD FINDINGS Brain: No acute intracranial abnormality. Specifically, no hemorrhage, hydrocephalus, mass lesion, acute infarction, or significant intracranial injury. There is atrophy and chronic small vessel disease changes. Vascular: There is a fracture through the anterior wall of the left frontal sinus with overlying soft tissue swelling. Fractures minimally depressed. Skull: No acute calvarial abnormality. Other: Soft tissue swelling over the left forehead. CT MAXILLOFACIAL FINDINGS Osseous: Fractures through the anterior and posterior aspect of the left zygomatic arch. Fractures through the anterior wall of the left frontal sinus as described above. Orbits: Negative. No traumatic or inflammatory finding. Sinuses: Mucosal thickening in the maxillary sinuses and frontal sinuses. No air-fluid levels. Soft tissues: Soft tissue swelling over the left forehead and orbital region. IMPRESSION: No acute intracranial abnormality. Fractures through the anterior and posterior aspect of the left zygomatic arch. Minimally depressed fracture through the anterior wall of the left frontal sinus. Electronically Signed   By: Charlett NoseKevin  Dover M.D.   On: 01/25/2019 22:48    Procedures Procedures (including critical care time)  Medications Ordered in ED Medications - No data to display   Initial Impression /  Assessment and Plan / ED Course  I have reviewed the triage vital signs and the nursing notes.  Pertinent labs & imaging results that were available during my care of the patient were reviewed by me and considered in my medical decision making (see chart for details).        Patient with 2 falls.  Left face tenderness.  On anticoagulation.  Has  frontal sinus fracture and zygomatic arch fracture.  Face does not have much deformity.  Follow-up with plastic surgery  Final Clinical Impressions(s) / ED Diagnoses   Final diagnoses:  Fall, initial encounter  Closed fracture of left zygomatic arch, initial encounter [facility])  Closed fracture of frontal sinus, initial encounter [facility])    ED Discharge Orders    None       Benjiman Core, MD 01/25/19 2324

## 2019-01-25 NOTE — Telephone Encounter (Signed)
No answer for covid 19 screening will attempt to call patient back. SM

## 2019-01-25 NOTE — ED Triage Notes (Signed)
Pt having fall x 2 since last Saturday and tonight at 1900, pt hit his head both times, small laceration noticed on left forehead, pt is on blood thinner sent to ED by PCP for CT head. Hx of Dementia, wife at the bedside.

## 2019-01-25 NOTE — ED Notes (Signed)
Pt and spouse understood dc material. NAD noted. Pt and spouse escorted to check out window. All questions answered to satisfaction.

## 2019-01-25 NOTE — ED Notes (Signed)
Patient transported to CT 

## 2019-01-27 ENCOUNTER — Encounter: Payer: Self-pay | Admitting: Gastroenterology

## 2019-01-27 ENCOUNTER — Ambulatory Visit (AMBULATORY_SURGERY_CENTER): Payer: Medicare Other | Admitting: Gastroenterology

## 2019-01-27 ENCOUNTER — Other Ambulatory Visit: Payer: Self-pay

## 2019-01-27 VITALS — BP 123/66 | HR 83 | Temp 98.3°F | Resp 22 | Ht 70.0 in | Wt 195.0 lb

## 2019-01-27 DIAGNOSIS — K219 Gastro-esophageal reflux disease without esophagitis: Secondary | ICD-10-CM | POA: Diagnosis not present

## 2019-01-27 DIAGNOSIS — K222 Esophageal obstruction: Secondary | ICD-10-CM

## 2019-01-27 DIAGNOSIS — K449 Diaphragmatic hernia without obstruction or gangrene: Secondary | ICD-10-CM

## 2019-01-27 DIAGNOSIS — K228 Other specified diseases of esophagus: Secondary | ICD-10-CM

## 2019-01-27 DIAGNOSIS — R1319 Other dysphagia: Secondary | ICD-10-CM

## 2019-01-27 DIAGNOSIS — K317 Polyp of stomach and duodenum: Secondary | ICD-10-CM | POA: Diagnosis not present

## 2019-01-27 DIAGNOSIS — R1310 Dysphagia, unspecified: Secondary | ICD-10-CM

## 2019-01-27 DIAGNOSIS — K229 Disease of esophagus, unspecified: Secondary | ICD-10-CM

## 2019-01-27 MED ORDER — SODIUM CHLORIDE 0.9 % IV SOLN: 500.0000 mL | Freq: Once | INTRAVENOUS | Status: DC

## 2019-01-27 NOTE — Progress Notes (Signed)
History reviewed with patient and wife today.

## 2019-01-27 NOTE — Progress Notes (Signed)
To PACU, VSS. Report to [email or web address] 

## 2019-01-27 NOTE — Op Note (Signed)
[facility] Patient Name: Daniel PinesBrantley Morrow Procedure Date: 01/27/2019 10:38 AM MRN: [id number] Endoscopist: Meryl DareMalcolm T Okie Bogacz , MD Age: 78 Referring MD:  Date of Birth: 08/04/1941 Gender: Male Account #: [id number] Procedure:                Upper GI endoscopy Indications:              Dysphagia, For therapy of esophageal stricture Medicines:                Monitored Anesthesia Care Procedure:                Pre-Anesthesia Assessment:                           - Prior to the procedure, a History and Physical                            was performed, and patient medications and                            allergies were reviewed. The patient's tolerance of                            previous anesthesia was also reviewed. The risks                            and benefits of the procedure and the sedation                            options and risks were discussed with the patient.                            All questions were answered, and informed consent                            was obtained. Prior Anticoagulants: The patient has                            taken antiplatelet medication, Aggrenox, last dose                            was 5 days prior to procedure. ASA Grade                            Assessment: II - A patient with mild systemic                            disease. After reviewing the risks and benefits,                            the patient was deemed in satisfactory condition to                            undergo the procedure.  After obtaining informed consent, the endoscope was                            passed under direct vision. Throughout the                            procedure, the patient's blood pressure, pulse, and                            oxygen saturations were monitored continuously. The                            Endoscope was introduced through the mouth, and                            advanced to the second part of  duodenum. The upper                            GI endoscopy was accomplished without difficulty.                            The patient tolerated the procedure well. Scope In: Scope Out: Findings:                 A single 4 mm nodule with a localized distribution                            was found in the proximal esophagus, 19 cm from the                            incisors. Biopsies were taken with a cold forceps                            for histology.                           One benign-appearing, intrinsic moderate stenosis                            was found 41 cm from the incisors. This stenosis                            measured 1.2 cm (inner diameter). The stenosis was                            traversed. A guidewire was placed and the scope was                            withdrawn. Dilations were performed with a Savary                            dilators with mild resistance at 13 mm, 14 mm and  15 mm. No heme.                           The exam of the esophagus was otherwise normal.                           Four 5 to 7 mm sessile polyps with no bleeding and                            no stigmata of recent bleeding were found on the                            greater curvature of the stomach. The polyps were                            removed with a cold snare. Resection was complete,                            and retrieval was complete.                           A small hiatal hernia was present.                           A small amount of food (residue) was found in the                            entire examined stomach that limited the exam.                           The exam of the stomach was otherwise normal.                           The duodenal bulb and second portion of the                            duodenum were normal. Complications:            No immediate complications. Estimated Blood Loss:     Estimated blood loss was  minimal. Impression:               - Nodule found in the esophagus. Biopsied.                           - Benign-appearing esophageal stenosis. Dilated.                           - Four gastric polyps. Resected and retrieved.                           - Small hiatal hernia.                           - A small amount of food (residue) in the stomach.                           -  Normal duodenal bulb and second portion of the                            duodenum. Recommendation:           - Await pathology results.                           - Patient has a contact number available for                            emergencies. The signs and symptoms of potential                            delayed complications were discussed with the                            patient. Return to normal activities tomorrow.                            Written discharge instructions were provided to the                            patient.                           - Clear liquid diet for 2 hours, then advance as                            tolerated to soft diet today. Resume prior diet                            tomorrow.                           - Continue present medications.                           - Continue pantoprazole 20 mg daily long term.                           - Await pathology results.                           - Return to GI office in 1 month. Assess need for                            repeat dilation.                           - Resume antiplatelet medication, Aggrenox, at                            prior dose tomorrow. Refer to managing physician                            for  further adjustment of therapy. Meryl Dare, MD 01/27/2019 11:05:31 AM This report has been signed electronically.

## 2019-01-27 NOTE — Progress Notes (Signed)
Oral temp per [facility], VS per Juel Burrow.

## 2019-01-27 NOTE — Progress Notes (Signed)
Called to room to assist during endoscopic procedure.  Patient ID and intended procedure confirmed with present staff. Received instructions for my participation in the procedure from the performing physician.  

## 2019-01-27 NOTE — Patient Instructions (Signed)
YOU HAD AN ENDOSCOPIC PROCEDURE TODAY AT THE [facility]:   Refer to the procedure report that was given to you for any specific questions about what was found during the examination.  If the procedure report does not answer your questions, please call your gastroenterologist to clarify.  If you requested that your care partner not be given the details of your procedure findings, then the procedure report has been included in a sealed envelope for you to review at your convenience later.  YOU SHOULD EXPECT: Some feelings of bloating in the abdomen. Passage of more gas than usual.  Walking can help get rid of the air that was put into your GI tract during the procedure and reduce the bloating. If you had a lower endoscopy (such as a colonoscopy or flexible sigmoidoscopy) you may notice spotting of blood in your stool or on the toilet paper. If you underwent a bowel prep for your procedure, you may not have a normal bowel movement for a few days.  Please Note:  You might notice some irritation and congestion in your nose or some drainage.  This is from the oxygen used during your procedure.  There is no need for concern and it should clear up in a day or so.  SYMPTOMS TO REPORT IMMEDIATELY:   Following upper endoscopy (EGD)  Vomiting of blood or coffee ground material  New chest pain or pain under the shoulder blades  Painful or persistently difficult swallowing  New shortness of breath  Fever of 100F or higher  Black, tarry-looking stools  For urgent or emergent issues, a gastroenterologist can be reached at any hour by calling [phone].   DIET:  We do recommend a small meal at first, but then you may proceed to your regular diet.  Drink plenty of fluids but you should avoid alcoholic beverages for 24 hours.  ACTIVITY:  You should plan to take it easy for the rest of today and you should NOT DRIVE or use heavy machinery until tomorrow (because of the sedation medicines used  during the test).    FOLLOW UP: Our staff will call the number listed on your records the next business day following your procedure to check on you and address any questions or concerns that you may have regarding the information given to you following your procedure. If we do not reach you, we will leave a message.  However, if you are feeling well and you are not experiencing any problems, there is no need to return our call.  We will assume that you have returned to your regular daily activities without incident. We will be calling you two weeks following your procedure to see if you have developed any symptoms of the COVID-19.  If you develop any symptoms before then, please let us know.  If any biopsies were taken you will be contacted by phone or by letter within the next 1-3 weeks.  Please call us at [phone] if you have not heard about the biopsies in 3 weeks.   Continue Pantoprazole 20 mg daily long term Return to GI in one month Aggrenox  At prior dose tomorrow Clear liquid for 2 hours then advance to soft diet resume prior dose tomorrow  SIGNATURES/CONFIDENTIALITY: You and/or your care partner have signed paperwork which will be entered into your electronic medical record.  These signatures attest to the fact that that the information above on your After Visit Summary has been reviewed and is understood.  Full  responsibility of the confidentiality of this discharge information lies with you and/or your care-partner.

## 2019-01-31 ENCOUNTER — Telehealth: Payer: Self-pay

## 2019-01-31 ENCOUNTER — Encounter: Payer: Self-pay | Admitting: Gastroenterology

## 2019-01-31 NOTE — Telephone Encounter (Signed)
  Follow up Call-  Call back number 01/27/2019  Post procedure Call Back phone  # [phone]  speak to wife [name])  Permission to leave phone message Yes  Some recent data might be hidden     Patient questions:  Do you have a fever, pain , or abdominal swelling? No. Pain Score  0 *  Have you tolerated food without any problems? Yes.    Have you been able to return to your normal activities? Yes.    Do you have any questions about your discharge instructions: Diet   No. Medications  No. Follow up visit  No.  Do you have questions or concerns about your Care? No.  Actions: * If pain score is 4 or above: No action needed, pain <4.

## 2019-02-03 ENCOUNTER — Telehealth: Payer: Self-pay | Admitting: *Deleted

## 2019-02-03 NOTE — Telephone Encounter (Signed)
1. Have you developed a fever since your procedure? no  2.   Have you had an respiratory symptoms (SOB or cough) since your procedure? no  3.   Have you tested positive for COVID 19 since your procedure no  3.   Have you had any family members/close contacts diagnosed with the COVID 19 since your procedure?  No    If any of these questions are a yes, please inquire if patient has been seen by family doctor and route this note to Tracy Walton, RN.  

## 2019-02-03 NOTE — Telephone Encounter (Signed)
Pt's wife returned call, pls call her again.

## 2019-02-03 NOTE — Telephone Encounter (Signed)
Attempted to reach pt by home and mobile numbers- Lm to return my call today for post procedure Covid questioning

## 2019-02-17 ENCOUNTER — Ambulatory Visit: Payer: Medicare Other | Admitting: Neurology

## 2019-03-02 ENCOUNTER — Telehealth: Payer: Self-pay

## 2019-03-02 NOTE — Telephone Encounter (Signed)
I called pts [name] that pt has a yearly appt with Dr.SEthi on 03/07/2019. I stated pt is currently seeing Dr. Carles Collet at [facility] neurology. Pt had an appt 09/2018, 11/2018 and an appt in 02/2019 with Dr. Carles Collet. I stated appt with Dr. Leonie Man will be cancel due to him seeing another neurologist. [name] knows appt will be cancel and will follow up with [facility] ongoing for neuro management.

## 2019-03-02 NOTE — Telephone Encounter (Signed)
Seems reasonable unless Dr. Carles Collet specifically wants the patient to follow-up with me for stroke:and to continue f/u with her for nonstroke neurological issues

## 2019-03-03 ENCOUNTER — Telehealth: Payer: Self-pay

## 2019-03-03 DIAGNOSIS — S0219XA Other fracture of base of skull, initial encounter for closed fracture: Secondary | ICD-10-CM | POA: Diagnosis not present

## 2019-03-03 DIAGNOSIS — S0240FA Zygomatic fracture, left side, initial encounter for closed fracture: Secondary | ICD-10-CM | POA: Diagnosis not present

## 2019-03-03 NOTE — Telephone Encounter (Signed)
Covid-19 screening questions No   Have you traveled in the last 14 days? If yes where?No  Do you now or have you had a fever in the last 14 days? No  Do you have any respiratory symptoms of shortness of breath or cough now or in the last 14 days? No  Do you have any family members or close contacts with diagnosed or suspected Covid-19 in the past 14 days? No  Have you been tested for Covid-19 and found to be positive? No      

## 2019-03-04 ENCOUNTER — Encounter: Payer: Self-pay | Admitting: Gastroenterology

## 2019-03-04 ENCOUNTER — Other Ambulatory Visit: Payer: Self-pay

## 2019-03-04 ENCOUNTER — Ambulatory Visit: Payer: Medicare Other | Admitting: Gastroenterology

## 2019-03-04 VITALS — BP 152/82 | HR 80 | Temp 98.3°F | Ht 70.0 in | Wt 196.0 lb

## 2019-03-04 DIAGNOSIS — R1310 Dysphagia, unspecified: Secondary | ICD-10-CM | POA: Diagnosis not present

## 2019-03-04 DIAGNOSIS — R1319 Other dysphagia: Secondary | ICD-10-CM

## 2019-03-04 DIAGNOSIS — K219 Gastro-esophageal reflux disease without esophagitis: Secondary | ICD-10-CM | POA: Diagnosis not present

## 2019-03-04 NOTE — Progress Notes (Signed)
    History of Present Illness: This is a 78 year old male with a history of GERD and an esophageal stricture returning for follow-up after esophageal dilation.  Esophageal stricture dilated on May 7. Food impaction removed in ED on April 21. No dysphagia or reflux since dilation. No GI complaints.   Current Medications, Allergies, Past Medical History, Past Surgical History, Family History and Social History were reviewed in [employer] record.   Physical Exam: General: Well developed, well nourished, no acute distress Head: Normocephalic and atraumatic Eyes:  sclerae anicteric, EOMI Ears: Normal auditory acuity Mouth: No deformity or lesions Lungs: Clear throughout to auscultation Heart: Regular rate and rhythm; no murmurs, rubs or bruits Abdomen: Soft, non tender and non distended. No masses, hepatosplenomegaly or hernias noted. Normal Bowel sounds Rectal: not done Musculoskeletal: Symmetrical with no gross deformities  Pulses:  Normal pulses noted Extremities: No clubbing, cyanosis, edema or deformities noted Neurological: Alert oriented x 4, grossly nonfocal Psychological:  Alert and cooperative. Normal mood and affect   Assessment and Recommendations:  1. Esophageal stricture with dysphagia, resolved following dilation. GERD.  Continue pantoprazole 20 mg daily long-term.  Follow standard antireflux measures long-term.  Patient is advised to call if his dysphagia returns otherwise he will have ongoing follow-up with his PCP Dr. Lavone Orn.  2. Dementia.   3. Prior CVA in 2013.

## 2019-03-04 NOTE — Patient Instructions (Addendum)
If you are age 76 or older, your body mass index should be between 23-30. Your Body mass index is 28.12 kg/m. If this is out of the aforementioned range listed, please consider follow up with your Primary Care Provider.  If you are age 83 or younger, your body mass index should be between 19-25. Your Body mass index is 28.12 kg/m. If this is out of the aformentioned range listed, please consider follow up with your Primary Care Provider.   Continue Pantoprazole 20mg  daily.   Follow- up with primary care if needed.   You may follow- up with our office as needed.  . Thank you for choosing me and [facility].  Pricilla Riffle. Dagoberto Ligas., MD., Marval Regal

## 2019-03-07 ENCOUNTER — Telehealth: Payer: Self-pay

## 2019-03-07 ENCOUNTER — Ambulatory Visit: Payer: Medicare Other | Admitting: Neurology

## 2019-03-07 NOTE — Telephone Encounter (Signed)
Made in error

## 2019-03-08 ENCOUNTER — Ambulatory Visit: Payer: Medicare Other | Admitting: Neurology

## 2019-03-08 NOTE — Progress Notes (Signed)
Daniel Morrow was seen today in the movement disorders clinic for neurologic consultation at the request of Kirby FunkGriffin, John, MD.  The consultation is for the evaluation of PD.  The records that were made available to me were reviewed, including PCP records, [place] records and records from hospitalization. This patient is accompanied in the office by his spouse who supplements the history. Patient is a 78 year old white male with a history of dementia, on Namenda, and CVA in 2013 who presented to the hospital on September 12, 2018 with complaints of shortness of breath and agitation.  Family expressed concern about possible Parkinson's disease, as patient had a tremor.  Dr. Otelia LimesLindzen was consulted on September 13, 2018.  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  Pt/wife unsure if that was helpful.  Patient did have a CT of the brain on September 12, 2018.  There was no acute findings on that CT.  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  Last MRI of the brain was done in March, 2017.  I have reviewed that.  There was mild small vessel disease.  Last took carbidopa/levodopa 25/100 was 10pm last night.  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 This patient is accompanied in the office by his spouse who supplements the history. He had a DaTscan since our last visit.  DaTscan demonstrated decreased activity in the left striatum, which can be positive in patients who have primary progressive aphasia.  Patient remains on carbidopa/levodopa 25/100, 1 tablet 3 times per day (started prior to seeing me).  No falls since our last visit.  Wife thinks that he is depressed and is concerned about tx for that.  States that he sits in his  chair and does not speak to her during much of the day.  I reviewed his CT brain from 09/12/18.  There was mild atrophy and small vessel disease.    03/10/2019 update: Patient is seen today for levodopa challenge test.  He is on carbidopa/levodopa 25/100, 1 tablet 3 times per day.  He last took carbidopa/levodopa 2 days ago.  Wife has noticed no significant change since being off of the medication.  She notes today that they are selling their home and moving into a new townhome on July 30.  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  The biggest issue they have is urinary issues.  The patient will just urinate in the middle of the kitchen, despite the fact he has undergarments on.  He is having some balance issues.  Last physical therapy was right before COVID hit.  PREVIOUS MEDICATIONS: Sinemet  ALLERGIES:  No Known Allergies  CURRENT MEDICATIONS:  Outpatient Encounter Medications as of 03/10/2019  Medication Sig   atorvastatin (LIPITOR) 40 MG tablet Take 40 mg by mouth at bedtime.    benazepril (LOTENSIN) 10 MG tablet Take 10 mg by mouth daily.   brimonidine (ALPHAGAN) 0.2 % ophthalmic solution Place 1 drop into the right eye 2 (two) times daily.    carbidopa-levodopa (SINEMET IR) 25-100 MG tablet Take 1 tablet by mouth 3 (three) times daily.   colesevelam (WELCHOL) 625 MG tablet 1,875 mg 2 (  two) times daily with a meal.    cyanocobalamin 100 MCG tablet Take 100 mcg by mouth daily.   dipyridamole-aspirin (AGGRENOX) 200-25 MG 12hr capsule Take 1 capsule by mouth 2 (two) times daily.   glimepiride (AMARYL) 4 MG tablet Take 4 mg by mouth daily.    latanoprost (XALATAN) 0.005 % ophthalmic solution Place 1 drop into the right eye at bedtime.    memantine (NAMENDA) 10 MG tablet Take 1 tablet (10 mg total) by mouth 2 (two) times daily.   Multiple Vitamin (MULTIVITAMIN WITH MINERALS) TABS tablet  Take 1 tablet by mouth daily.   pantoprazole (PROTONIX) 20 MG tablet Take 20 mg by mouth daily.   Semaglutide, 1 MG/DOSE, (OZEMPIC, 1 MG/DOSE,) 2 MG/1.5ML SOPN Inject 2 mg into the skin every Monday.   sertraline (ZOLOFT) 100 MG tablet Take 1 tablet (100 mg total) by mouth daily.   timolol (TIMOPTIC) 0.5 % ophthalmic solution Place 1 drop into the right eye 2 (two) times daily.   No facility-administered encounter medications on file as of 03/10/2019.     PAST MEDICAL HISTORY:   Past Medical History:  Diagnosis Date   Arthritis    "knees" (09/14/2018)   Blind left eye    Chronic bronchitis (HCC)    Dementia (HCC)    Depression    GERD (gastroesophageal reflux disease)    Glaucoma    History of kidney stones    Hyperlipidemia    Hypertension    Pneumonia    "maybe twice" (09/14/2018)   Recurrent UTI (urinary tract infection)    Stroke ([place]) 11/2011   "didn't effect him at the time" (09/14/2018)   Type II diabetes mellitus ([place])     PAST SURGICAL HISTORY:   Past Surgical History:  Procedure Laterality Date   ABDOMINAL HERNIA REPAIR     CATARACT EXTRACTION W/ INTRAOCULAR LENS  IMPLANT, BILATERAL Bilateral    COLONOSCOPY     ESOPHAGOGASTRODUODENOSCOPY N/A 01/11/2019   Procedure: ESOPHAGOGASTRODUODENOSCOPY (EGD);  Surgeon: Ladene Artist, MD;  Location: [facility] ENDOSCOPY;  Service: Endoscopy;  Laterality: N/A;   ESOPHAGOGASTRODUODENOSCOPY (EGD) WITH ESOPHAGEAL DILATION  X 2   FOREIGN BODY REMOVAL  01/11/2019   FRACTURE SURGERY     HERNIA REPAIR     PATELLA FRACTURE SURGERY Left    POSTERIOR CERVICAL FUSION/FORAMINOTOMY      SOCIAL HISTORY:   Social History   Socioeconomic History   Marital status: Married    Spouse name: Trudy   Number of children: 3   Years of education: Engineer, agricultural education level: Not on file  Occupational History   Occupation: Retired  Scientist, product/process development strain: Not on file   Food insecurity      Worry: Not on file    Inability: Not on Lexicographer needs    Medical: Not on file    Non-medical: Not on file  Tobacco Use   Smoking status: Former Smoker    Packs/day: 1.00    Years: 20.00    Pack years: 20.00    Types: Cigarettes    Quit date: 1980    Years since quitting: 40.4   Smokeless tobacco: Former Systems developer    Quit date: 1985  Substance and Sexual Activity   Alcohol use: No   Drug use: Never   Sexual activity: Not on file  Lifestyle   Physical activity    Days per week: Not on file    Minutes per session: Not on file  Stress: Not on file  Relationships   Social connections    Talks on phone: Not on file    Gets together: Not on file    Attends religious service: Not on file    Active member of club or organization: Not on file    Attends meetings of clubs or organizations: Not on file    Relationship status: Not on file   Intimate partner violence    Fear of current or ex partner: Not on file    Emotionally abused: Not on file    Physically abused: Not on file    Forced sexual activity: Not on file  Other Topics Concern   Not on file  Social History Narrative   Patient lives at home with family.   Caffeine Use: 2 cups daily    FAMILY HISTORY:   Family Status  Relation Name Status   Mother  Deceased at age 76       blood disorder   Father  Deceased at age 76       Brain tumor   Brother x3 Alive   Brother x1 Deceased   Daughter x3 Alive   Neg Hx  (Not Specified)    ROS:  Review of Systems  Unable to perform ROS: Dementia    PHYSICAL EXAMINATION:    VITALS:   Vitals:   03/10/19 1258  BP: (!) 174/84  Pulse: 63  Temp: 97.8 F (36.6 C)  SpO2: 96%  Weight: 198 lb 12.8 oz (90.2 kg)    GEN:  The patient appears stated age and is in NAD. HEENT:  Normocephalic, atraumatic.  The mucous membranes are moist. The superficial temporal arteries are without ropiness or tenderness. CV:  RRR Lungs:  CTAB Neck/HEME:  There  are no carotid bruits bilaterally.  Neurological examination:  Orientation:  Montreal Cognitive Assessment  10/13/2018  Visuospatial/ Executive (0/5) 1  Naming (0/3) 2  Attention: Read list of digits (0/2) 1  Attention: Read list of letters (0/1) 0  Attention: Serial 7 subtraction starting at 100 (0/3) 1  Language: Repeat phrase (0/2) 0  Language : Fluency (0/1) 0  Abstraction (0/2) 0  Delayed Recall (0/5) 1  Orientation (0/6) 3  Total 9  Adjusted Score (based on education) 9    Cranial nerves: There is good facial symmetry. There is facial hypomimia with lips parted.   Extraocular muscles are intact. The visual fields are full to confrontational testing. The speech lacks spontaneity and he speaks very little.  When he does speak, it is clear.  He is able to name correctly a safety pin, shoehorn and describes a tongue depressor (initially calls it a "file").  Soft palate rises symmetrically and there is no tongue deviation. Hearing is intact to conversational tone. Sensation: Sensation is intact to light throughout. Motor: Strength is 5/5 in the bilateral upper and lower extremities.   Shoulder shrug is equal and symmetric.  There is no pronator drift.  Levodopa challenge done today.  UPDRS motor off score was 27.  Pt then given 250mg  of levodopa dissolved in ginger ale and waited 35minutes to re-examine him.  UPDRS motor on score was 26.  Details of UPDRS motor score documented on separate neurophysiologic worksheet.     Movement examination: Tone: pt has significant trouble relaxing (gegenhalten) Abnormal movements: no rest tremor is noted on or off of medication  Coordination:  There is slowness with all RAMs.  It is difficult to tell whether there is decremation given this. Gait  and Station: The patient pushes off of the chair to arise (unable to do it on or off levodopa).  The patient's stride length is good today.  He is just mildly forward flexed at the waist.      Chemistry        Component Value Date/Time   NA 142 01/10/2019 2340   NA 144 06/03/2016 1240   K 4.2 01/10/2019 2340   CL 105 01/10/2019 2340   CO2 27 01/10/2019 2340   BUN 18 01/10/2019 2340   BUN 18 06/03/2016 1240   CREATININE 1.24 01/10/2019 2340      Component Value Date/Time   CALCIUM 9.4 01/10/2019 2340   ALKPHOS 102 01/10/2019 2340   AST 21 01/10/2019 2340   ALT 25 01/10/2019 2340   BILITOT 1.8 (H) 01/10/2019 2340     Lab Results  Component Value Date   TSH 1.394 09/13/2018     ASSESSMENT/PLAN:  1.  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  -Discussed in detail nature and pathophysiology of primary progressive aphasia to patient and wife.  Discussed that this can be accompanied by parkinsonian features, but this does not mean he has Parkinson's disease.  -Discussed the importance of regular daily schedule with the patient, including physical and mental exercises.  Discussed exactly what that meant.  -Patient and his wife will be moving very soon to a townhome.  Discussed that there may be increased confusion when moving to a new home.  2.  Patient has a history of stroke and [facility]  However, medical records are reviewed and Dr. Pearlean BrownieSethi has canceled that appointment.  Wife states that he was just told that he no longer needed to be followed up.  3.  We will see the patient back in 9 months, sooner should new neurologic issues arise.  Much greater than 50% of this visit was spent in counseling and coordinating care.  Total face to face time:  40 min, not including wait time for levodopa to kick in   Cc:  Kirby FunkGriffin, John, MD

## 2019-03-08 NOTE — Telephone Encounter (Signed)
If patient is stable from stroke stand point there is no need to see me and can continue all future f/u with Dr tat only

## 2019-03-09 ENCOUNTER — Telehealth: Payer: Self-pay

## 2019-03-09 ENCOUNTER — Ambulatory Visit: Payer: Medicare Other | Admitting: Neurology

## 2019-03-09 NOTE — Telephone Encounter (Signed)
Called patient no answer left message to stop Rx prior to appt on Thursday. Left message to call back with any question.

## 2019-03-09 NOTE — Telephone Encounter (Signed)
-----   Message from [place], DO sent at 03/08/2019  5:28 PM EDT ----- Call pt/wife in AM.  Do not take any more carbidopa/levodopa 25/100 prior to appointment with me on Thursday as it is on/off test.

## 2019-03-10 ENCOUNTER — Ambulatory Visit (INDEPENDENT_AMBULATORY_CARE_PROVIDER_SITE_OTHER): Payer: Medicare Other | Admitting: Neurology

## 2019-03-10 ENCOUNTER — Other Ambulatory Visit: Payer: Self-pay

## 2019-03-10 ENCOUNTER — Encounter: Payer: Self-pay | Admitting: Neurology

## 2019-03-10 VITALS — BP 174/84 | HR 63 | Temp 97.8°F | Wt 198.8 lb

## 2019-03-10 DIAGNOSIS — G3101 Pick's disease: Secondary | ICD-10-CM | POA: Diagnosis not present

## 2019-03-10 DIAGNOSIS — G3109 Other frontotemporal dementia: Secondary | ICD-10-CM

## 2019-03-10 DIAGNOSIS — F0280 Dementia in other diseases classified elsewhere without behavioral disturbance: Secondary | ICD-10-CM | POA: Diagnosis not present

## 2019-03-10 MED ORDER — CARBIDOPA-LEVODOPA 25-100 MG PO TABS
3.0000 | ORAL_TABLET | Freq: Once | ORAL | Status: AC
  Administered 2019-03-10: 3 via ORAL

## 2019-03-10 NOTE — Progress Notes (Signed)
Pt given 2.5 tablets of Carbidopa-Levodopa 25/100 mg PO crush with diet ginger ale in clinic today. Pt finished drink at 1:22 PM 1/2 tablet wasted

## 2019-03-10 NOTE — Patient Instructions (Signed)
1.  Stop carbidopa/levodopa 25/100 2.  Do daily physical exercise safely 3.  Get on a regular daily schedule.   4.  See the urologist 5.  Make an appointment in 6-9 months

## 2019-03-22 DIAGNOSIS — F0390 Unspecified dementia without behavioral disturbance: Secondary | ICD-10-CM | POA: Diagnosis not present

## 2019-03-22 DIAGNOSIS — E1165 Type 2 diabetes mellitus with hyperglycemia: Secondary | ICD-10-CM | POA: Diagnosis not present

## 2019-03-22 DIAGNOSIS — I10 Essential (primary) hypertension: Secondary | ICD-10-CM | POA: Diagnosis not present

## 2019-03-22 DIAGNOSIS — E785 Hyperlipidemia, unspecified: Secondary | ICD-10-CM | POA: Diagnosis not present

## 2019-04-19 DIAGNOSIS — I10 Essential (primary) hypertension: Secondary | ICD-10-CM | POA: Diagnosis not present

## 2019-04-19 DIAGNOSIS — F0390 Unspecified dementia without behavioral disturbance: Secondary | ICD-10-CM | POA: Diagnosis not present

## 2019-04-19 DIAGNOSIS — E1165 Type 2 diabetes mellitus with hyperglycemia: Secondary | ICD-10-CM | POA: Diagnosis not present

## 2019-04-19 DIAGNOSIS — E785 Hyperlipidemia, unspecified: Secondary | ICD-10-CM | POA: Diagnosis not present

## 2019-05-05 DIAGNOSIS — R509 Fever, unspecified: Secondary | ICD-10-CM | POA: Diagnosis not present

## 2019-05-09 DIAGNOSIS — E1142 Type 2 diabetes mellitus with diabetic polyneuropathy: Secondary | ICD-10-CM | POA: Diagnosis not present

## 2019-05-09 DIAGNOSIS — E1165 Type 2 diabetes mellitus with hyperglycemia: Secondary | ICD-10-CM | POA: Diagnosis not present

## 2019-05-09 DIAGNOSIS — E1122 Type 2 diabetes mellitus with diabetic chronic kidney disease: Secondary | ICD-10-CM | POA: Diagnosis not present

## 2019-05-09 DIAGNOSIS — K219 Gastro-esophageal reflux disease without esophagitis: Secondary | ICD-10-CM | POA: Diagnosis not present

## 2019-05-19 DIAGNOSIS — E785 Hyperlipidemia, unspecified: Secondary | ICD-10-CM | POA: Diagnosis not present

## 2019-05-19 DIAGNOSIS — E1165 Type 2 diabetes mellitus with hyperglycemia: Secondary | ICD-10-CM | POA: Diagnosis not present

## 2019-05-19 DIAGNOSIS — F0390 Unspecified dementia without behavioral disturbance: Secondary | ICD-10-CM | POA: Diagnosis not present

## 2019-05-19 DIAGNOSIS — I10 Essential (primary) hypertension: Secondary | ICD-10-CM | POA: Diagnosis not present

## 2019-06-13 DIAGNOSIS — R410 Disorientation, unspecified: Secondary | ICD-10-CM | POA: Diagnosis not present

## 2019-06-17 DIAGNOSIS — M79641 Pain in right hand: Secondary | ICD-10-CM | POA: Diagnosis not present

## 2019-06-17 DIAGNOSIS — M19041 Primary osteoarthritis, right hand: Secondary | ICD-10-CM | POA: Diagnosis not present

## 2019-06-29 DIAGNOSIS — E1165 Type 2 diabetes mellitus with hyperglycemia: Secondary | ICD-10-CM | POA: Diagnosis not present

## 2019-06-29 DIAGNOSIS — E785 Hyperlipidemia, unspecified: Secondary | ICD-10-CM | POA: Diagnosis not present

## 2019-06-29 DIAGNOSIS — I10 Essential (primary) hypertension: Secondary | ICD-10-CM | POA: Diagnosis not present

## 2019-06-29 DIAGNOSIS — F0390 Unspecified dementia without behavioral disturbance: Secondary | ICD-10-CM | POA: Diagnosis not present

## 2019-07-02 DIAGNOSIS — M25511 Pain in right shoulder: Secondary | ICD-10-CM | POA: Diagnosis not present

## 2019-07-02 DIAGNOSIS — M79601 Pain in right arm: Secondary | ICD-10-CM | POA: Diagnosis not present

## 2019-07-04 DIAGNOSIS — Z961 Presence of intraocular lens: Secondary | ICD-10-CM | POA: Diagnosis not present

## 2019-07-04 DIAGNOSIS — H402223 Chronic angle-closure glaucoma, left eye, severe stage: Secondary | ICD-10-CM | POA: Diagnosis not present

## 2019-07-04 DIAGNOSIS — H26061 Combined forms of infantile and juvenile cataract, right eye: Secondary | ICD-10-CM | POA: Diagnosis not present

## 2019-07-04 DIAGNOSIS — H402211 Chronic angle-closure glaucoma, right eye, mild stage: Secondary | ICD-10-CM | POA: Diagnosis not present

## 2019-07-26 DIAGNOSIS — Z1389 Encounter for screening for other disorder: Secondary | ICD-10-CM | POA: Diagnosis not present

## 2019-07-26 DIAGNOSIS — E1122 Type 2 diabetes mellitus with diabetic chronic kidney disease: Secondary | ICD-10-CM | POA: Diagnosis not present

## 2019-07-26 DIAGNOSIS — I10 Essential (primary) hypertension: Secondary | ICD-10-CM | POA: Diagnosis not present

## 2019-07-26 DIAGNOSIS — Z0000 Encounter for general adult medical examination without abnormal findings: Secondary | ICD-10-CM | POA: Diagnosis not present

## 2019-07-26 DIAGNOSIS — E1142 Type 2 diabetes mellitus with diabetic polyneuropathy: Secondary | ICD-10-CM | POA: Diagnosis not present

## 2019-07-26 DIAGNOSIS — E785 Hyperlipidemia, unspecified: Secondary | ICD-10-CM | POA: Diagnosis not present

## 2019-09-29 NOTE — Progress Notes (Signed)
Virtual Visit via Video Note The purpose of this virtual visit is to provide medical care while limiting exposure to the novel coronavirus.    Consent was obtained for video visit:  Yes.   Answered questions that patient had about telehealth interaction:  Yes.   I discussed the limitations, risks, security and privacy concerns of performing an evaluation and management service by telemedicine. I also discussed with the patient that there may be a patient responsible charge related to this service. The patient expressed understanding and agreed to proceed.  Pt location: Home Physician Location: office Name of referring provider:  Kirby Funk, MD I connected with Darene Lamer at patients initiation/request on 10/03/2019 at  2:00 PM EST by video enabled telemedicine application and verified that I am speaking with the correct person using two identifiers. Pt MRN:  [id number] Pt DOB:  [birth year] Video Participants:  Darene Lamer;  Wife supplements hx   History of Present Illness:  Patient seen today in follow-up for PPA.  My medical records have been reviewed since our last visit.  Those are the only medical records currently available.  Levodopa challenge test was done last visit and demonstrated no benefit to levodopa.  He was told to stop the levodopa.  Wife states that he is a bit more tremulous.  He has had some "bad falls" but no falls in the last 3 months.  He does use his walker a lot but not all of the time.  They have moved to a one level town home and that has helped.  A PT comes into the home one day a week with him.  He had 2 UTI's and he could not even walk when he had those.  Wife dresses/bathes him.  Swallowing is generally good except one episode that sent him to the ER  Current movement d/o meds:  none   Current Outpatient Medications on File Prior to Visit  Medication Sig Dispense Refill  . atorvastatin (LIPITOR) 40 MG tablet Take 40 mg by mouth at bedtime.     .  benazepril (LOTENSIN) 20 MG tablet Take 20 mg by mouth daily.     . brimonidine (ALPHAGAN) 0.2 % ophthalmic solution Place 1 drop into the right eye 2 (two) times daily.   3  . colesevelam (WELCHOL) 625 MG tablet 1,875 mg 2 (two) times daily with a meal.     . cyanocobalamin 100 MCG tablet Take 100 mcg by mouth daily.    Marland Kitchen dipyridamole-aspirin (AGGRENOX) 200-25 MG 12hr capsule Take 1 capsule by mouth 2 (two) times daily.    Marland Kitchen glimepiride (AMARYL) 4 MG tablet Take 4 mg by mouth daily.   3  . latanoprost (XALATAN) 0.005 % ophthalmic solution Place 1 drop into the right eye at bedtime.   5  . memantine (NAMENDA) 10 MG tablet Take 1 tablet (10 mg total) by mouth 2 (two) times daily. 180 tablet 3  . Multiple Vitamin (MULTIVITAMIN WITH MINERALS) TABS tablet Take 1 tablet by mouth daily.    . pantoprazole (PROTONIX) 20 MG tablet Take 20 mg by mouth daily.    . Semaglutide, 1 MG/DOSE, (OZEMPIC, 1 MG/DOSE,) 2 MG/1.5ML SOPN Inject 2 mg into the skin every Monday.    . sertraline (ZOLOFT) 100 MG tablet Take 1 tablet (100 mg total) by mouth daily. 90 tablet 2  . timolol (TIMOPTIC) 0.5 % ophthalmic solution Place 1 drop into the right eye 2 (two) times daily.    . traZODone (DESYREL)  50 MG tablet Take 50 mg by mouth at bedtime.    . carbidopa-levodopa (SINEMET IR) 25-100 MG tablet Take 1 tablet by mouth 3 (three) times daily. (Patient not taking: Reported on 09/30/2019) 270 tablet 1   No current facility-administered medications on file prior to visit.     Observations/Objective:   Vitals:   09/30/19 1128  Weight: 180 lb (81.6 kg)  Height: 5\' 10"  (1.778 m)   GEN:  The patient appears stated age and is in NAD.  Pt appears thin  Neurological examination:  Orientation: The patient is alert Cranial nerves: There is good facial symmetry. There is facial hypomimia.  The speech lacks spontaneity.  He will repeat a few words and those are clear.  Motor: Strength is at least antigravity x 4.   Shoulder shrug  is equal and symmetric.  There is no pronator drift.  Movement examination: Tone: unable Abnormal movements: none seen  Assessment and Plan:   1.  Primary progressive aphasia  -Previous levodopa challenge test demonstrated no efficacy to levodopa  -has lost 20 lbs since last visit  -discussed respite care.  Has sitter 3 days a week, 6 hours per day.  Discussed [facility] day program  -use walker as much as possible  -discussed covid vaccine and they are already scheduled 2.  History of prior cerebral infarction  -Has seen Dr. Leonie Man in the past.  Told that he did not need further follow-up by [facility] neurology.    Follow Up Instructions:  prn  -I discussed the assessment and treatment plan with the patient. The patient was provided an opportunity to ask questions and all were answered. The patient agreed with the plan and demonstrated an understanding of the instructions.   The patient was advised to call back or seek an in-person evaluation if the symptoms worsen or if the condition fails to improve as anticipated.    Total time spent on today's visit was 20 minutes, including both face-to-face time and nonface-to-face time.  Time included that spent on review of records (prior notes available to me/labs/imaging if pertinent), discussing treatment and goals, answering patient's questions and coordinating care.   Alonza Bogus, DO

## 2019-09-30 ENCOUNTER — Encounter: Payer: Self-pay | Admitting: Neurology

## 2019-10-03 ENCOUNTER — Telehealth (INDEPENDENT_AMBULATORY_CARE_PROVIDER_SITE_OTHER): Payer: Medicare HMO | Admitting: Neurology

## 2019-10-03 ENCOUNTER — Observation Stay (HOSPITAL_BASED_OUTPATIENT_CLINIC_OR_DEPARTMENT_OTHER)
Admission: EM | Admit: 2019-10-03 | Discharge: 2019-10-05 | Disposition: A | Discharge status: Home / Self Care | Payer: Medicare HMO | Attending: Internal Medicine | Admitting: Internal Medicine

## 2019-10-03 ENCOUNTER — Other Ambulatory Visit: Payer: Self-pay

## 2019-10-03 ENCOUNTER — Encounter (HOSPITAL_BASED_OUTPATIENT_CLINIC_OR_DEPARTMENT_OTHER): Payer: Self-pay | Admitting: Emergency Medicine

## 2019-10-03 VITALS — Ht 70.0 in | Wt 178.0 lb

## 2019-10-03 DIAGNOSIS — K317 Polyp of stomach and duodenum: Secondary | ICD-10-CM | POA: Insufficient documentation

## 2019-10-03 DIAGNOSIS — T18108A Unspecified foreign body in esophagus causing other injury, initial encounter: Secondary | ICD-10-CM

## 2019-10-03 DIAGNOSIS — R0902 Hypoxemia: Secondary | ICD-10-CM | POA: Diagnosis present

## 2019-10-03 DIAGNOSIS — K449 Diaphragmatic hernia without obstruction or gangrene: Secondary | ICD-10-CM | POA: Diagnosis not present

## 2019-10-03 DIAGNOSIS — K222 Esophageal obstruction: Secondary | ICD-10-CM | POA: Diagnosis not present

## 2019-10-03 DIAGNOSIS — I69320 Aphasia following cerebral infarction: Secondary | ICD-10-CM | POA: Diagnosis not present

## 2019-10-03 DIAGNOSIS — Z7984 Long term (current) use of oral hypoglycemic drugs: Secondary | ICD-10-CM | POA: Insufficient documentation

## 2019-10-03 DIAGNOSIS — H409 Unspecified glaucoma: Secondary | ICD-10-CM | POA: Diagnosis present

## 2019-10-03 DIAGNOSIS — Z79899 Other long term (current) drug therapy: Secondary | ICD-10-CM | POA: Insufficient documentation

## 2019-10-03 DIAGNOSIS — R918 Other nonspecific abnormal finding of lung field: Secondary | ICD-10-CM | POA: Diagnosis not present

## 2019-10-03 DIAGNOSIS — E1165 Type 2 diabetes mellitus with hyperglycemia: Secondary | ICD-10-CM | POA: Diagnosis not present

## 2019-10-03 DIAGNOSIS — M170 Bilateral primary osteoarthritis of knee: Secondary | ICD-10-CM | POA: Diagnosis not present

## 2019-10-03 DIAGNOSIS — X58XXXA Exposure to other specified factors, initial encounter: Secondary | ICD-10-CM | POA: Diagnosis not present

## 2019-10-03 DIAGNOSIS — I10 Essential (primary) hypertension: Secondary | ICD-10-CM | POA: Diagnosis not present

## 2019-10-03 DIAGNOSIS — H5462 Unqualified visual loss, left eye, normal vision right eye: Secondary | ICD-10-CM | POA: Diagnosis not present

## 2019-10-03 DIAGNOSIS — E119 Type 2 diabetes mellitus without complications: Secondary | ICD-10-CM | POA: Diagnosis not present

## 2019-10-03 DIAGNOSIS — E785 Hyperlipidemia, unspecified: Secondary | ICD-10-CM | POA: Diagnosis present

## 2019-10-03 DIAGNOSIS — G3101 Pick's disease: Secondary | ICD-10-CM

## 2019-10-03 DIAGNOSIS — G20 Parkinson's disease: Secondary | ICD-10-CM | POA: Insufficient documentation

## 2019-10-03 DIAGNOSIS — Z20822 Contact with and (suspected) exposure to covid-19: Secondary | ICD-10-CM | POA: Insufficient documentation

## 2019-10-03 DIAGNOSIS — F0280 Dementia in other diseases classified elsewhere without behavioral disturbance: Secondary | ICD-10-CM | POA: Diagnosis present

## 2019-10-03 DIAGNOSIS — T17920A Food in respiratory tract, part unspecified causing asphyxiation, initial encounter: Secondary | ICD-10-CM | POA: Diagnosis not present

## 2019-10-03 DIAGNOSIS — T18128A Food in esophagus causing other injury, initial encounter: Principal | ICD-10-CM | POA: Insufficient documentation

## 2019-10-03 DIAGNOSIS — K219 Gastro-esophageal reflux disease without esophagitis: Secondary | ICD-10-CM | POA: Insufficient documentation

## 2019-10-03 DIAGNOSIS — F329 Major depressive disorder, single episode, unspecified: Secondary | ICD-10-CM | POA: Diagnosis not present

## 2019-10-03 DIAGNOSIS — R69 Illness, unspecified: Secondary | ICD-10-CM | POA: Diagnosis not present

## 2019-10-03 DIAGNOSIS — T17908A Unspecified foreign body in respiratory tract, part unspecified causing other injury, initial encounter: Secondary | ICD-10-CM

## 2019-10-03 DIAGNOSIS — Z03818 Encounter for observation for suspected exposure to other biological agents ruled out: Secondary | ICD-10-CM | POA: Diagnosis not present

## 2019-10-03 DIAGNOSIS — G309 Alzheimer's disease, unspecified: Secondary | ICD-10-CM | POA: Insufficient documentation

## 2019-10-03 DIAGNOSIS — R1111 Vomiting without nausea: Secondary | ICD-10-CM | POA: Diagnosis not present

## 2019-10-03 HISTORY — DX: Aphagia: R13.0

## 2019-10-03 NOTE — ED Triage Notes (Signed)
Pt got steak stuck in throat, vomited afterwards, now continues to drool. Pt is non-verbal due to previous stroke and dementia.

## 2019-10-04 ENCOUNTER — Emergency Department (HOSPITAL_COMMUNITY): Payer: Medicare HMO | Admitting: Certified Registered Nurse Anesthetist

## 2019-10-04 ENCOUNTER — Encounter (HOSPITAL_BASED_OUTPATIENT_CLINIC_OR_DEPARTMENT_OTHER): Payer: Self-pay | Admitting: Emergency Medicine

## 2019-10-04 ENCOUNTER — Encounter (HOSPITAL_COMMUNITY)
Admission: EM | Disposition: A | Discharge status: Home / Self Care | Payer: Self-pay | Source: Home / Self Care | Attending: Emergency Medicine

## 2019-10-04 ENCOUNTER — Telehealth: Payer: Self-pay | Admitting: Gastroenterology

## 2019-10-04 ENCOUNTER — Emergency Department (HOSPITAL_BASED_OUTPATIENT_CLINIC_OR_DEPARTMENT_OTHER): Payer: Medicare HMO

## 2019-10-04 ENCOUNTER — Emergency Department (HOSPITAL_COMMUNITY): Payer: Medicare HMO

## 2019-10-04 DIAGNOSIS — Z79899 Other long term (current) drug therapy: Secondary | ICD-10-CM | POA: Diagnosis not present

## 2019-10-04 DIAGNOSIS — R0902 Hypoxemia: Secondary | ICD-10-CM | POA: Diagnosis not present

## 2019-10-04 DIAGNOSIS — R1310 Dysphagia, unspecified: Secondary | ICD-10-CM

## 2019-10-04 DIAGNOSIS — H5462 Unqualified visual loss, left eye, normal vision right eye: Secondary | ICD-10-CM | POA: Diagnosis not present

## 2019-10-04 DIAGNOSIS — F0280 Dementia in other diseases classified elsewhere without behavioral disturbance: Secondary | ICD-10-CM | POA: Diagnosis not present

## 2019-10-04 DIAGNOSIS — G309 Alzheimer's disease, unspecified: Secondary | ICD-10-CM | POA: Diagnosis not present

## 2019-10-04 DIAGNOSIS — T18108A Unspecified foreign body in esophagus causing other injury, initial encounter: Secondary | ICD-10-CM | POA: Diagnosis not present

## 2019-10-04 DIAGNOSIS — T18128A Food in esophagus causing other injury, initial encounter: Secondary | ICD-10-CM | POA: Diagnosis not present

## 2019-10-04 DIAGNOSIS — Z20822 Contact with and (suspected) exposure to covid-19: Secondary | ICD-10-CM | POA: Diagnosis not present

## 2019-10-04 DIAGNOSIS — R1319 Other dysphagia: Secondary | ICD-10-CM | POA: Insufficient documentation

## 2019-10-04 DIAGNOSIS — H409 Unspecified glaucoma: Secondary | ICD-10-CM | POA: Diagnosis present

## 2019-10-04 DIAGNOSIS — Z7901 Long term (current) use of anticoagulants: Secondary | ICD-10-CM | POA: Insufficient documentation

## 2019-10-04 DIAGNOSIS — Z7984 Long term (current) use of oral hypoglycemic drugs: Secondary | ICD-10-CM | POA: Diagnosis not present

## 2019-10-04 DIAGNOSIS — I10 Essential (primary) hypertension: Secondary | ICD-10-CM | POA: Diagnosis not present

## 2019-10-04 DIAGNOSIS — G20 Parkinson's disease: Secondary | ICD-10-CM | POA: Diagnosis not present

## 2019-10-04 DIAGNOSIS — K317 Polyp of stomach and duodenum: Secondary | ICD-10-CM | POA: Diagnosis not present

## 2019-10-04 DIAGNOSIS — E119 Type 2 diabetes mellitus without complications: Secondary | ICD-10-CM | POA: Diagnosis not present

## 2019-10-04 DIAGNOSIS — K449 Diaphragmatic hernia without obstruction or gangrene: Secondary | ICD-10-CM | POA: Diagnosis not present

## 2019-10-04 DIAGNOSIS — I69320 Aphasia following cerebral infarction: Secondary | ICD-10-CM | POA: Diagnosis not present

## 2019-10-04 DIAGNOSIS — K219 Gastro-esophageal reflux disease without esophagitis: Secondary | ICD-10-CM | POA: Diagnosis not present

## 2019-10-04 DIAGNOSIS — M170 Bilateral primary osteoarthritis of knee: Secondary | ICD-10-CM | POA: Diagnosis not present

## 2019-10-04 DIAGNOSIS — X58XXXA Exposure to other specified factors, initial encounter: Secondary | ICD-10-CM | POA: Diagnosis not present

## 2019-10-04 DIAGNOSIS — R69 Illness, unspecified: Secondary | ICD-10-CM | POA: Diagnosis not present

## 2019-10-04 DIAGNOSIS — K222 Esophageal obstruction: Secondary | ICD-10-CM

## 2019-10-04 DIAGNOSIS — F329 Major depressive disorder, single episode, unspecified: Secondary | ICD-10-CM | POA: Diagnosis not present

## 2019-10-04 DIAGNOSIS — R918 Other nonspecific abnormal finding of lung field: Secondary | ICD-10-CM | POA: Diagnosis not present

## 2019-10-04 HISTORY — PX: ESOPHAGOGASTRODUODENOSCOPY: SHX5428

## 2019-10-04 HISTORY — PX: IMPACTION REMOVAL: SHX5858

## 2019-10-04 LAB — CBC WITH DIFFERENTIAL/PLATELET
Abs Immature Granulocytes: 0.02 10*3/uL (ref 0.00–0.07)
Basophils Absolute: 0 10*3/uL (ref 0.0–0.1)
Basophils Relative: 1 %
Eosinophils Absolute: 0.1 10*3/uL (ref 0.0–0.5)
Eosinophils Relative: 1 %
HCT: 41.6 % (ref 39.0–52.0)
Hemoglobin: 13 g/dL (ref 13.0–17.0)
Immature Granulocytes: 0 %
Lymphocytes Relative: 17 %
Lymphs Abs: 1.1 10*3/uL (ref 0.7–4.0)
MCH: 27.9 pg (ref 26.0–34.0)
MCHC: 31.3 g/dL (ref 30.0–36.0)
MCV: 89.3 fL (ref 80.0–100.0)
Monocytes Absolute: 0.5 10*3/uL (ref 0.1–1.0)
Monocytes Relative: 7 %
Neutro Abs: 4.6 10*3/uL (ref 1.7–7.7)
Neutrophils Relative %: 74 %
Platelets: 211 10*3/uL (ref 150–400)
RBC: 4.66 MIL/uL (ref 4.22–5.81)
RDW: 14.6 % (ref 11.5–15.5)
WBC: 6.2 10*3/uL (ref 4.0–10.5)
nRBC: 0 % (ref 0.0–0.2)

## 2019-10-04 LAB — GLUCOSE, CAPILLARY
Glucose-Capillary: 138 mg/dL — ABNORMAL HIGH (ref 70–99)
Glucose-Capillary: 247 mg/dL — ABNORMAL HIGH (ref 70–99)
Glucose-Capillary: 133 mg/dL — ABNORMAL HIGH (ref 70–99)

## 2019-10-04 LAB — BASIC METABOLIC PANEL
Anion gap: 10 (ref 5–15)
BUN: 19 mg/dL (ref 8–23)
CO2: 27 mmol/L (ref 22–32)
Calcium: 8.6 mg/dL — ABNORMAL LOW (ref 8.9–10.3)
Chloride: 103 mmol/L (ref 98–111)
Creatinine, Ser: 1.32 mg/dL — ABNORMAL HIGH (ref 0.61–1.24)
GFR calc Af Amer: >60 mL/min (ref >60)
GFR calc non Af Amer: 52 mL/min — ABNORMAL LOW (ref >60)
Glucose, Bld: 178 mg/dL — ABNORMAL HIGH (ref 70–99)
Potassium: 4 mmol/L (ref 3.5–5.1)
Sodium: 140 mmol/L (ref 135–145)

## 2019-10-04 LAB — RESPIRATORY PANEL BY RT PCR (FLU A&B, COVID)
Influenza A by PCR: NEGATIVE
Influenza B by PCR: NEGATIVE
SARS Coronavirus 2 by RT PCR: NEGATIVE

## 2019-10-04 LAB — HEMOGLOBIN A1C
Hgb A1c MFr Bld: 7 % — ABNORMAL HIGH (ref 4.8–5.6)
Mean Plasma Glucose: 154.2 mg/dL

## 2019-10-04 LAB — MAGNESIUM: Magnesium: 1.5 mg/dL — ABNORMAL LOW (ref 1.7–2.4)

## 2019-10-04 SURGERY — EGD (ESOPHAGOGASTRODUODENOSCOPY)
Anesthesia: Monitor Anesthesia Care | Laterality: Left

## 2019-10-04 MED ORDER — LORAZEPAM 2 MG/ML IJ SOLN
0.2500 mg | Freq: Once | INTRAMUSCULAR | Status: AC
  Administered 2019-10-04: 0.25 mg via INTRAVENOUS
  Filled 2019-10-04: qty 1

## 2019-10-04 MED ORDER — FENTANYL CITRATE (PF) 100 MCG/2ML IJ SOLN
INTRAMUSCULAR | Status: DC | PRN
  Administered 2019-10-04: 25 ug via INTRAVENOUS

## 2019-10-04 MED ORDER — TIMOLOL MALEATE 0.5 % OP SOLN
1.0000 [drp] | Freq: Two times a day (BID) | OPHTHALMIC | Status: DC
  Administered 2019-10-04 – 2019-10-05 (×3): 1 [drp] via OPHTHALMIC
  Filled 2019-10-04: qty 5

## 2019-10-04 MED ORDER — IPRATROPIUM-ALBUTEROL 0.5-2.5 (3) MG/3ML IN SOLN: 3.0000 mL | RESPIRATORY_TRACT | Status: DC | PRN

## 2019-10-04 MED ORDER — PROPOFOL 500 MG/50ML IV EMUL
INTRAVENOUS | Status: AC
  Filled 2019-10-04: qty 50

## 2019-10-04 MED ORDER — LIDOCAINE 2% (20 MG/ML) 5 ML SYRINGE
INTRAMUSCULAR | Status: DC | PRN
  Administered 2019-10-04: 60 mg via INTRAVENOUS

## 2019-10-04 MED ORDER — MAGNESIUM SULFATE 2 GM/50ML IV SOLN
2.0000 g | Freq: Once | INTRAVENOUS | Status: AC
  Administered 2019-10-04: 2 g via INTRAVENOUS
  Filled 2019-10-04: qty 50

## 2019-10-04 MED ORDER — ACETAMINOPHEN 325 MG PO TABS: 650.0000 mg | ORAL_TABLET | Freq: Four times a day (QID) | ORAL | Status: DC | PRN

## 2019-10-04 MED ORDER — ONDANSETRON HCL 4 MG/2ML IJ SOLN
4.0000 mg | Freq: Once | INTRAMUSCULAR | Status: AC
  Administered 2019-10-04: 4 mg via INTRAVENOUS
  Filled 2019-10-04: qty 2

## 2019-10-04 MED ORDER — ONDANSETRON HCL 4 MG/2ML IJ SOLN: 4.0000 mg | Freq: Four times a day (QID) | INTRAMUSCULAR | Status: DC | PRN

## 2019-10-04 MED ORDER — SODIUM CHLORIDE 0.45 % IV SOLN: INTRAVENOUS | Status: AC

## 2019-10-04 MED ORDER — BRIMONIDINE TARTRATE 0.2 % OP SOLN
1.0000 [drp] | Freq: Two times a day (BID) | OPHTHALMIC | Status: DC
  Administered 2019-10-04 – 2019-10-05 (×3): 1 [drp] via OPHTHALMIC
  Filled 2019-10-04: qty 5

## 2019-10-04 MED ORDER — LACTATED RINGERS IV SOLN: INTRAVENOUS | Status: DC

## 2019-10-04 MED ORDER — LATANOPROST 0.005 % OP SOLN
1.0000 [drp] | Freq: Every day | OPHTHALMIC | Status: DC
  Administered 2019-10-04: 1 [drp] via OPHTHALMIC
  Filled 2019-10-04: qty 2.5

## 2019-10-04 MED ORDER — LORAZEPAM 2 MG/ML IJ SOLN
0.7500 mg | Freq: Once | INTRAMUSCULAR | Status: AC
  Administered 2019-10-04: 0.75 mg via INTRAVENOUS
  Filled 2019-10-04: qty 1

## 2019-10-04 MED ORDER — PANTOPRAZOLE SODIUM 40 MG IV SOLR
40.0000 mg | INTRAVENOUS | Status: DC
  Administered 2019-10-04 – 2019-10-05 (×2): 40 mg via INTRAVENOUS
  Filled 2019-10-04: qty 40

## 2019-10-04 MED ORDER — INSULIN ASPART 100 UNIT/ML ~~LOC~~ SOLN
0.0000 [IU] | Freq: Three times a day (TID) | SUBCUTANEOUS | Status: DC
  Administered 2019-10-04: 5 [IU] via SUBCUTANEOUS
  Administered 2019-10-04: 2 [IU] via SUBCUTANEOUS
  Administered 2019-10-05: 11 [IU] via SUBCUTANEOUS

## 2019-10-04 MED ORDER — PROPOFOL 10 MG/ML IV BOLUS
INTRAVENOUS | Status: AC
  Filled 2019-10-04: qty 20

## 2019-10-04 MED ORDER — SODIUM CHLORIDE 0.9 % IV SOLN: INTRAVENOUS | Status: DC

## 2019-10-04 MED ORDER — PANTOPRAZOLE SODIUM 20 MG PO TBEC: 20.0000 mg | DELAYED_RELEASE_TABLET | Freq: Two times a day (BID) | ORAL | 0 refills | Status: DC

## 2019-10-04 MED ORDER — GLUCAGON HCL RDNA (DIAGNOSTIC) 1 MG IJ SOLR: 1.0000 mg | Freq: Once | INTRAMUSCULAR | Status: AC

## 2019-10-04 MED ORDER — ONDANSETRON HCL 4 MG/2ML IJ SOLN
INTRAMUSCULAR | Status: DC | PRN
  Administered 2019-10-04: 4 mg via INTRAVENOUS

## 2019-10-04 MED ORDER — METHYLPREDNISOLONE SODIUM SUCC 40 MG IJ SOLR
40.0000 mg | Freq: Once | INTRAMUSCULAR | Status: AC
  Administered 2019-10-04: 40 mg via INTRAVENOUS
  Filled 2019-10-04: qty 1

## 2019-10-04 MED ORDER — EPHEDRINE SULFATE-NACL 50-0.9 MG/10ML-% IV SOSY
PREFILLED_SYRINGE | INTRAVENOUS | Status: DC | PRN
  Administered 2019-10-04: 5 mg via INTRAVENOUS
  Administered 2019-10-04: 10 mg via INTRAVENOUS
  Administered 2019-10-04 (×2): 5 mg via INTRAVENOUS

## 2019-10-04 MED ORDER — PHENYLEPHRINE 40 MCG/ML (10ML) SYRINGE FOR IV PUSH (FOR BLOOD PRESSURE SUPPORT)
PREFILLED_SYRINGE | INTRAVENOUS | Status: DC | PRN
  Administered 2019-10-04 (×2): 40 ug via INTRAVENOUS
  Administered 2019-10-04 (×2): 120 ug via INTRAVENOUS

## 2019-10-04 MED ORDER — PROPOFOL 10 MG/ML IV BOLUS
INTRAVENOUS | Status: DC | PRN
  Administered 2019-10-04 (×3): 30 mg via INTRAVENOUS

## 2019-10-04 MED ORDER — SUCRALFATE 1 GM/10ML PO SUSP: 1.0000 g | Freq: Four times a day (QID) | ORAL | 1 refills | Status: DC

## 2019-10-04 MED ORDER — GLUCAGON HCL RDNA (DIAGNOSTIC) 1 MG IJ SOLR
INTRAMUSCULAR | Status: AC
  Administered 2019-10-04: 1 mg via INTRAVENOUS
  Filled 2019-10-04: qty 1

## 2019-10-04 MED ORDER — FENTANYL CITRATE (PF) 100 MCG/2ML IJ SOLN
INTRAMUSCULAR | Status: AC
  Filled 2019-10-04: qty 2

## 2019-10-04 MED ORDER — SUCCINYLCHOLINE CHLORIDE 200 MG/10ML IV SOSY
PREFILLED_SYRINGE | INTRAVENOUS | Status: DC | PRN
  Administered 2019-10-04: 60 mg via INTRAVENOUS

## 2019-10-04 MED ORDER — ONDANSETRON HCL 4 MG PO TABS: 4.0000 mg | ORAL_TABLET | Freq: Four times a day (QID) | ORAL | Status: DC | PRN

## 2019-10-04 MED ORDER — ACETAMINOPHEN 650 MG RE SUPP: 650.0000 mg | Freq: Four times a day (QID) | RECTAL | Status: DC | PRN

## 2019-10-04 NOTE — ED Notes (Signed)
Attempted IV in right FA unsuccessful.  

## 2019-10-04 NOTE — ED Notes (Addendum)
Attempted to call Cirigliano, MD, with results of Covid test. There was no answer.

## 2019-10-04 NOTE — Consult Note (Signed)
Consultation  Referring Provider:     Cy Blamer, MD  Primary Care Physician:  Kirby Funk, MD Primary Gastroenterologist:        Claudette Head, MD Reason for Consultation:     Dysphagia, acute food impaction         HPI:   Daniel Morrow is a 79 y.o. male with a history of CVA in 2013 in 2019, primary progressive aphasia, GERD, esophageal stenosis, presenting to med [facility] ER with acute food impaction.  Similar presentation 12/2018, requiring emergent EGD for food disimpaction.  EGD at that time notable for me in the lower esophagus, removed with three-pronged graspers, along with stenosis at 41 cm and a 3 mm proximal esophageal nodule.  Subsequent EGD in 01/2019 again notable for stenosis at 41 cm, dilated with 15 mm Savary dilator.  Esophageal nodule biopsy-benign, small hiatal hernia, benign gastric polyps.  History provided by his wife at his bedside.  She states he had been doing well since his EGD with dilation with Dr. Russella Dar in 01/2019.  He was tolerating all p.o. intake without any issues, with acute food impaction after eating dinner (country fried steak) prompting him to go to ER.  No longer tolerating secretions.  No improvement with trial of Zofran and glucagon, so transferred from [facility] to [facility] ER.  Labs this evening with stable renal function (creatinine 1.3), normal CBC, Covid negative.  Past Medical History:  Diagnosis Date  . Aphagia   . Arthritis    "knees" (09/14/2018)  . Blind left eye   . Chronic bronchitis (HCC)   . Dementia (HCC)   . Depression   . GERD (gastroesophageal reflux disease)   . Glaucoma   . History of kidney stones   . Hyperlipidemia   . Hypertension   . Pneumonia    "maybe twice" (09/14/2018)  . Recurrent UTI (urinary tract infection)   . Stroke [facility]) 11/2011   "didn't effect him at the time" (09/14/2018)  . Type II diabetes mellitus (HCC)     Past Surgical History:  Procedure Laterality Date  . ABDOMINAL HERNIA REPAIR     . CATARACT EXTRACTION W/ INTRAOCULAR LENS  IMPLANT, BILATERAL Bilateral   . COLONOSCOPY    . ESOPHAGOGASTRODUODENOSCOPY N/A 01/11/2019   Procedure: ESOPHAGOGASTRODUODENOSCOPY (EGD);  Surgeon: Meryl Dare, MD;  Location: [facility] ENDOSCOPY;  Service: Endoscopy;  Laterality: N/A;  . ESOPHAGOGASTRODUODENOSCOPY (EGD) WITH ESOPHAGEAL DILATION  X 2  . FOREIGN BODY REMOVAL  01/11/2019  . FRACTURE SURGERY    . HERNIA REPAIR    . PATELLA FRACTURE SURGERY Left   . POSTERIOR CERVICAL FUSION/FORAMINOTOMY      Family History  Problem Relation Age of Onset  . Polycythemia Mother   . Lung cancer Father   . Other Brother        brain tumor  . Diabetes Daughter   . Colon cancer Neg Hx   . Esophageal cancer Neg Hx   . Rectal cancer Neg Hx   . Stomach cancer Neg Hx     Social History   Tobacco Use  . Smoking status: Former Smoker    Packs/day: 1.00    Years: 20.00    Pack years: 20.00    Types: Cigarettes    Quit date: 1980    Years since quitting: 41.0  . Smokeless tobacco: Former Neurosurgeon    Quit date: 1985  Substance Use Topics  . Alcohol use: No  . Drug use: Never  Prior to Admission medications   Medication Sig Start Date End Date Taking? Authorizing Provider  atorvastatin (LIPITOR) 40 MG tablet Take 40 mg by mouth at bedtime.     [provider]  benazepril (LOTENSIN) 20 MG tablet Take 20 mg by mouth daily.     [provider]  brimonidine (ALPHAGAN) 0.2 % ophthalmic solution Place 1 drop into the right eye 2 (two) times daily.  02/25/18   [provider]  carbidopa-levodopa (SINEMET IR) 25-100 MG tablet Take 1 tablet by mouth 3 (three) times daily. Patient not taking: Reported on 09/30/2019 12/06/18   Tat, Octaviano Batty, DO  colesevelam [facility]) 625 MG tablet 1,875 mg 2 (two) times daily with a meal.  12/22/18   [provider]  cyanocobalamin 100 MCG tablet Take 100 mcg by mouth daily.    [provider]  dipyridamole-aspirin (AGGRENOX) 200-25  MG 12hr capsule Take 1 capsule by mouth 2 (two) times daily.    [provider]  glimepiride (AMARYL) 4 MG tablet Take 4 mg by mouth daily.  06/12/16   [provider]  latanoprost (XALATAN) 0.005 % ophthalmic solution Place 1 drop into the right eye at bedtime.  06/02/16   [provider]  memantine (NAMENDA) 10 MG tablet Take 1 tablet (10 mg total) by mouth 2 (two) times daily. 08/24/18   Micki Riley, MD  Multiple Vitamin (MULTIVITAMIN WITH MINERALS) TABS tablet Take 1 tablet by mouth daily.    [provider]  pantoprazole (PROTONIX) 20 MG tablet Take 20 mg by mouth daily.    [provider]  Semaglutide, 1 MG/DOSE, (OZEMPIC, 1 MG/DOSE,) 2 MG/1.5ML SOPN Inject 2 mg into the skin every Monday.    [provider]  sertraline (ZOLOFT) 100 MG tablet Take 1 tablet (100 mg total) by mouth daily. 03/02/17   Micki Riley, MD  timolol (TIMOPTIC) 0.5 % ophthalmic solution Place 1 drop into the right eye 2 (two) times daily.    [provider]  traZODone (DESYREL) 50 MG tablet Take 50 mg by mouth at bedtime.    [provider]    No current facility-administered medications for this encounter.   Current Outpatient Medications  Medication Sig Dispense Refill  . atorvastatin (LIPITOR) 40 MG tablet Take 40 mg by mouth at bedtime.     . benazepril (LOTENSIN) 20 MG tablet Take 20 mg by mouth daily.     . brimonidine (ALPHAGAN) 0.2 % ophthalmic solution Place 1 drop into the right eye 2 (two) times daily.   3  . carbidopa-levodopa (SINEMET IR) 25-100 MG tablet Take 1 tablet by mouth 3 (three) times daily. (Patient not taking: Reported on 09/30/2019) 270 tablet 1  . colesevelam (WELCHOL) 625 MG tablet 1,875 mg 2 (two) times daily with a meal.     . cyanocobalamin 100 MCG tablet Take 100 mcg by mouth daily.    Marland Kitchen dipyridamole-aspirin (AGGRENOX) 200-25 MG 12hr capsule Take 1 capsule by mouth 2 (two) times daily.    Marland Kitchen glimepiride (AMARYL) 4  MG tablet Take 4 mg by mouth daily.   3  . latanoprost (XALATAN) 0.005 % ophthalmic solution Place 1 drop into the right eye at bedtime.   5  . memantine (NAMENDA) 10 MG tablet Take 1 tablet (10 mg total) by mouth 2 (two) times daily. 180 tablet 3  . Multiple Vitamin (MULTIVITAMIN WITH MINERALS) TABS tablet Take 1 tablet by mouth daily.    . pantoprazole (PROTONIX) 20 MG tablet  Take 20 mg by mouth daily.    . Semaglutide, 1 MG/DOSE, (OZEMPIC, 1 MG/DOSE,) 2 MG/1.5ML SOPN Inject 2 mg into the skin every Monday.    . sertraline (ZOLOFT) 100 MG tablet Take 1 tablet (100 mg total) by mouth daily. 90 tablet 2  . timolol (TIMOPTIC) 0.5 % ophthalmic solution Place 1 drop into the right eye 2 (two) times daily.    . traZODone (DESYREL) 50 MG tablet Take 50 mg by mouth at bedtime.      Allergies as of 10/03/2019  . (No Known Allergies)     Review of Systems:    As per HPI, otherwise negative    Physical Exam:  Vital signs in last 24 hours: Temp:  [98.1 F (36.7 C)-98.4 F (36.9 C)] 98.1 F (36.7 C) (01/12 0238) Pulse Rate:  [85-91] 91 (01/12 0238) Resp:  [16-20] 20 (01/12 0238) BP: (163-172)/(89-93) 172/89 (01/12 0238) SpO2:  [93 %] 93 % (01/12 0238) Weight:  [80.7 kg] 80.7 kg (01/11 2353)   General:   Pleasant male in NAD Head:  Normocephalic and atraumatic. Eyes:   No icterus.   Conjunctiva pink. Ears:  Normal auditory acuity. Neck:  Supple Lungs:  Respirations even and unlabored. Lungs clear to auscultation bilaterally.   No wheezes, crackles, or rhonchi.  Heart:  Regular rate and rhythm; no MRG Abdomen:  Soft, nondistended, nontender. Normal bowel sounds. No appreciable masses or hepatomegaly.  Rectal:  Not performed.  Msk:  Symmetrical without gross deformities.  Extremities:  Without edema. Skin:  Intact without significant lesions or rashes.  LAB RESULTS: Recent Labs    10/04/19 0125  WBC 6.2  HGB 13.0  HCT 41.6  PLT 211   BMET Recent Labs    10/04/19 0125  NA  140  K 4.0  CL 103  CO2 27  GLUCOSE 178*  BUN 19  CREATININE 1.32*  CALCIUM 8.6*   LFT No results for input(s): PROT, ALBUMIN, AST, ALT, ALKPHOS, BILITOT, BILIDIR, IBILI in the last 72 hours. PT/INR No results for input(s): LABPROT, INR in the last 72 hours.  STUDIES: DG Neck Soft Tissue  Result Date: 10/04/2019 CLINICAL DATA:  79 year old male with steak stuck in throat with subsequent vomiting. Continues to truly. History of stroke and dementia. EXAM: NECK SOFT TISSUES - 1+ VIEW COMPARISON:  CTA head and neck 06/20/2016. FINDINGS: C2-C3 interbody ankylosis and chronic cervical spine degeneration. Normal prevertebral soft tissue contour. Visible pharyngeal contours and epiglottis are within normal limits. Superimposed cervical carotid calcified atherosclerosis. No radiopaque foreign body identified. Mostly absent dentition. No acute osseous abnormality identified. Negative visible upper chest. IMPRESSION: Stable compared to a 2017 Neck CTA. No retained foreign body in the neck identified radiographically. Electronically Signed   By: Genevie Ann M.D.   On: 10/04/2019 00:26     PREVIOUS ENDOSCOPIES:            As above   Impression / Plan:   1) Acute food impaction 2) Dysphagia 3) Esophageal stenosis 4) Hiatal hernia  79 year old male presenting with acute food impaction.  Prior known history of food impaction in 12/2018 requiring emergent EGD, now with similar presentation.  Had been doing well after esophageal dilation (Savary 15 mm) in 01/2019, with acute recurrence this evening.  -Plan for emergent EGD for disimpaction -Additional recommendations pending endoscopic findings  5) History of CVA 6) Chronic systemic anticoagulation -Takes Aggrenox.  Discussed elevated risks, but given emergent nature, proceeding with EGD as above  The indications, risks, and  benefits of EGD were explained to the patient and his wife in detail. Risks include but are not limited to bleeding,  perforation, adverse reaction to medications, and cardiopulmonary compromise. Sequelae include but are not limited to the possibility of surgery, hositalization, and mortality.  She verbalized understanding and wished to proceed with the procedure on his behalf. All questions answered.  Further recommendations pending results of the exam.      LOS: 0 days   Shellia Cleverly  10/04/2019, 4:31 AM

## 2019-10-04 NOTE — ED Provider Notes (Signed)
[facility] EMERGENCY DEPARTMENT Provider Note   CSN: [id number] Arrival date & time: 10/03/19  2347     History Chief Complaint  Patient presents with  . Swallowed Foreign Body    Daniel Morrow is a 79 y.o. male.  The history is provided by the spouse. The history is limited by the condition of the patient (level dementia and non verbal).  Foreign Body Location:  Swallowed Suspected object:  Button and food Duration:  5 hours Timing:  Constant Progression:  Unchanged Chronicity:  Recurrent Worsened by:  Nothing Ineffective treatments:  None tried Associated symptoms: drooling and vomiting   Associated symptoms: no difficulty breathing   Risk factors: prior similar events   Patient with dementia and who is non verbal was eating country steak at 9 pm and then began to drool and not tolerate his secretions.  Something similar happened in Daniel Morrow 2020 and patient had to have EGD to alleviate the food bolus.       Past Medical History:  Diagnosis Date  . Aphagia   . Arthritis    "knees" (09/14/2018)  . Blind left eye   . Chronic bronchitis ([place])   . Dementia ([place])   . Depression   . GERD (gastroesophageal reflux disease)   . Glaucoma   . History of kidney stones   . Hyperlipidemia   . Hypertension   . Pneumonia    "maybe twice" (09/14/2018)  . Recurrent UTI (urinary tract infection)   . Stroke [facility]) 11/2011   "didn't effect him at the time" (09/14/2018)  . Type II diabetes mellitus [facility])     Patient Active Problem List   Diagnosis Date Noted  . Shortness of breath at rest 09/12/2018  . Shortness of breath 09/12/2018  . Vision, loss, sudden 06/03/2016  . CRAO (central retinal artery occlusion) 06/03/2016  . Alzheimer's disease ([place]) 01/16/2016  . Epididymo-orchitis 12/02/2013  . Orchitis and epididymitis 12/02/2013  . Mild cognitive impairment, so stated 02/17/2013  . CVA (cerebral infarction) 02/17/2013  . 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 Acute ischemic multifocal posterior circulation stroke ([place]) 04/03/2012  . Hypertension 04/03/2012  . Diabetes mellitus ([place]) 04/03/2012  . Hyperlipidemia     Past Surgical History:  Procedure Laterality Date  . ABDOMINAL HERNIA REPAIR    . CATARACT EXTRACTION W/ INTRAOCULAR LENS  IMPLANT, BILATERAL Bilateral   . COLONOSCOPY    . ESOPHAGOGASTRODUODENOSCOPY N/A 01/11/2019   Procedure: ESOPHAGOGASTRODUODENOSCOPY (EGD);  Surgeon: Ladene Artist, MD;  Location: [facility] ENDOSCOPY;  Service: Endoscopy;  Laterality: N/A;  . ESOPHAGOGASTRODUODENOSCOPY (EGD) WITH ESOPHAGEAL DILATION  X 2  . FOREIGN BODY REMOVAL  01/11/2019  . FRACTURE SURGERY    . HERNIA REPAIR    . PATELLA FRACTURE SURGERY Left   . POSTERIOR CERVICAL FUSION/FORAMINOTOMY         Family History  Problem Relation Age of Onset  . Polycythemia Mother   . Lung cancer Father   . Other Brother        brain tumor  . Diabetes Daughter   . Colon cancer Neg Hx   . Esophageal cancer Neg Hx   . Rectal cancer Neg Hx   . Stomach cancer Neg Hx     Social History   Tobacco Use  . Smoking status: Former Smoker    Packs/day: 1.00    Years: 20.00    Pack years: 20.00    Types: Cigarettes    Quit date:  1980    Years since quitting: 41.0  . Smokeless tobacco: Former Neurosurgeon    Quit date: 1985  Substance Use Topics  . Alcohol use: No  . Drug use: Never    Home Medications Prior to Admission medications   Medication Sig Start Date End Date Taking? Authorizing Provider  atorvastatin (LIPITOR) 40 MG tablet Take 40 mg by mouth at bedtime.     [provider]  benazepril (LOTENSIN) 20 MG tablet Take 20 mg by mouth daily.     [provider]  brimonidine (ALPHAGAN) 0.2 % ophthalmic solution Place 1 drop into the right eye 2 (two) times daily.  02/25/18   [provider]  carbidopa-levodopa (SINEMET IR) 25-100 MG tablet Take 1 tablet by mouth 3 (three)  times daily. Patient not taking: Reported on 09/30/2019 12/06/18   Tat, Octaviano Batty, DO  colesevelam [facility]) 625 MG tablet 1,875 mg 2 (two) times daily with a meal.  12/22/18   [provider]  cyanocobalamin 100 MCG tablet Take 100 mcg by mouth daily.    [provider]  dipyridamole-aspirin (AGGRENOX) 200-25 MG 12hr capsule Take 1 capsule by mouth 2 (two) times daily.    [provider]  glimepiride (AMARYL) 4 MG tablet Take 4 mg by mouth daily.  06/12/16   [provider]  latanoprost (XALATAN) 0.005 % ophthalmic solution Place 1 drop into the right eye at bedtime.  06/02/16   [provider]  memantine (NAMENDA) 10 MG tablet Take 1 tablet (10 mg total) by mouth 2 (two) times daily. 08/24/18   Micki Riley, MD  Multiple Vitamin (MULTIVITAMIN WITH MINERALS) TABS tablet Take 1 tablet by mouth daily.    [provider]  pantoprazole (PROTONIX) 20 MG tablet Take 20 mg by mouth daily.    [provider]  Semaglutide, 1 MG/DOSE, (OZEMPIC, 1 MG/DOSE,) 2 MG/1.5ML SOPN Inject 2 mg into the skin every Monday.    [provider]  sertraline (ZOLOFT) 100 MG tablet Take 1 tablet (100 mg total) by mouth daily. 03/02/17   Micki Riley, MD  timolol (TIMOPTIC) 0.5 % ophthalmic solution Place 1 drop into the right eye 2 (two) times daily.    [provider]  traZODone (DESYREL) 50 MG tablet Take 50 mg by mouth at bedtime.    [provider]    Allergies    Patient has no known allergies.  Review of Systems   Review of Systems  Unable to perform ROS: Dementia  HENT: Positive for drooling.   Gastrointestinal: Positive for vomiting.    Physical Exam Updated Vital Signs BP (!) 163/93   Pulse 85   Temp 98.4 F (36.9 C) (Oral)   Resp 16   Ht 5\' 10"  (1.778 m)   Wt 80.7 kg   SpO2 93%   BMI 25.53 kg/m   Physical Exam Vitals and nursing note reviewed.  Constitutional:      Appearance: Normal appearance.  HENT:      Head: Normocephalic and atraumatic.     Nose: Nose normal.     Mouth/Throat:     Comments: Spitting in a bag actively  Eyes:     Conjunctiva/sclera: Conjunctivae normal.  Cardiovascular:     Rate and Rhythm: Normal rate and regular rhythm.     Pulses: Normal pulses.     Heart sounds: Normal heart sounds.  Pulmonary:     Effort: Pulmonary effort is normal. No respiratory distress.     Breath sounds:  Normal breath sounds. No stridor. No wheezing or rales.  Abdominal:     General: Abdomen is flat. Bowel sounds are normal.     Tenderness: There is no abdominal tenderness. There is no guarding or rebound.  Musculoskeletal:        General: Normal range of motion.     Cervical back: Normal range of motion and neck supple.  Skin:    General: Skin is warm and dry.     Capillary Refill: Capillary refill takes less than 2 seconds.  Neurological:     Mental Status: He is alert.     Deep Tendon Reflexes: Reflexes normal.  Psychiatric:     Comments: unable     ED Results / Procedures / Treatments   Labs (all labs ordered are listed, but only abnormal results are displayed) Results for orders placed or performed during the hospital encounter of 10/03/19  CBC with Differential  Result Value Ref Range   WBC 6.2 4.0 - 10.5 K/uL   RBC 4.66 4.22 - 5.81 MIL/uL   Hemoglobin 13.0 13.0 - 17.0 g/dL   HCT 99.3 57.0 - 17.7 %   MCV 89.3 80.0 - 100.0 fL   MCH 27.9 26.0 - 34.0 pg   MCHC 31.3 30.0 - 36.0 g/dL   RDW 93.9 03.0 - 09.2 %   Platelets 211 150 - 400 K/uL   nRBC 0.0 0.0 - 0.2 %   Neutrophils Relative % 74 %   Neutro Abs 4.6 1.7 - 7.7 K/uL   Lymphocytes Relative 17 %   Lymphs Abs 1.1 0.7 - 4.0 K/uL   Monocytes Relative 7 %   Monocytes Absolute 0.5 0.1 - 1.0 K/uL   Eosinophils Relative 1 %   Eosinophils Absolute 0.1 0.0 - 0.5 K/uL   Basophils Relative 1 %   Basophils Absolute 0.0 0.0 - 0.1 K/uL   Immature Granulocytes 0 %   Abs Immature Granulocytes 0.02 0.00 - 0.07 K/uL  Basic  metabolic panel  Result Value Ref Range   Sodium 140 135 - 145 mmol/L   Potassium 4.0 3.5 - 5.1 mmol/L   Chloride 103 98 - 111 mmol/L   CO2 27 22 - 32 mmol/L   Glucose, Bld 178 (H) 70 - 99 mg/dL   BUN 19 8 - 23 mg/dL   Creatinine, Ser 3.30 (H) 0.61 - 1.24 mg/dL   Calcium 8.6 (L) 8.9 - 10.3 mg/dL   GFR calc non Af Amer 52 (L) >60 mL/min   GFR calc Af Amer >60 >60 mL/min   Anion gap 10 5 - 15   DG Neck Soft Tissue  Result Date: 10/04/2019 CLINICAL DATA:  79 year old male with steak stuck in throat with subsequent vomiting. Continues to truly. History of stroke and dementia. EXAM: NECK SOFT TISSUES - 1+ VIEW COMPARISON:  CTA head and neck 06/20/2016. FINDINGS: C2-C3 interbody ankylosis and chronic cervical spine degeneration. Normal prevertebral soft tissue contour. Visible pharyngeal contours and epiglottis are within normal limits. Superimposed cervical carotid calcified atherosclerosis. No radiopaque foreign body identified. Mostly absent dentition. No acute osseous abnormality identified. Negative visible upper chest. IMPRESSION: Stable compared to a 2017 Neck CTA. No retained foreign body in the neck identified radiographically. Electronically Signed   By: Odessa Fleming M.D.   On: 10/04/2019 00:26    Radiology DG Neck Soft Tissue  Result Date: 10/04/2019 CLINICAL DATA:  79 year old male with steak stuck in throat with subsequent vomiting. Continues to truly. History of stroke and dementia. EXAM: NECK SOFT TISSUES -  1+ VIEW COMPARISON:  CTA head and neck 06/20/2016. FINDINGS: C2-C3 interbody ankylosis and chronic cervical spine degeneration. Normal prevertebral soft tissue contour. Visible pharyngeal contours and epiglottis are within normal limits. Superimposed cervical carotid calcified atherosclerosis. No radiopaque foreign body identified. Mostly absent dentition. No acute osseous abnormality identified. Negative visible upper chest. IMPRESSION: Stable compared to a 2017 Neck CTA. No retained  foreign body in the neck identified radiographically. Electronically Signed   By: Odessa Fleming M.D.   On: 10/04/2019 00:26    Procedures Procedures (including critical care time)  Medications Ordered in ED Medications  ondansetron (ZOFRAN) injection 4 mg (4 mg Intravenous Given 10/04/19 0124)  glucagon (human recombinant) (GLUCAGEN) injection 1 mg (1 mg Intravenous Given 10/04/19 0124)    ED Course  I have reviewed the triage vital signs and the nursing notes.  Pertinent labs & imaging results that were available during my care of the patient were reviewed by me and considered in my medical decision making (see chart for details).    Case d/w Cirigliano who is awaiting covid results to go to endoscopy.  Please call on his cell phone [phone] with results of cepheid.    Case d/e Dr/ Oletta Cohn who is aware of the transfer  There has been no improvement with glucagon and zofran in the ED  Final Clinical Impression(s) / ED Diagnoses   Transfer to [facility] for Endoscopy to alleviate food bolus    Daniel Gandolfi, MD 10/04/19 5809

## 2019-10-04 NOTE — Anesthesia Postprocedure Evaluation (Signed)
Anesthesia Post Note  Patient: CLEVEN JANSMA  Procedure(s) Performed: ESOPHAGOGASTRODUODENOSCOPY (EGD) (Left ) IMPACTION REMOVAL     Patient location during evaluation: PACU Anesthesia Type: General Level of consciousness: awake and alert, oriented and patient cooperative Pain management: pain level controlled Vital Signs Assessment: post-procedure vital signs reviewed and stable Respiratory status: spontaneous breathing, nonlabored ventilation and respiratory function stable Cardiovascular status: blood pressure returned to baseline and stable Postop Assessment: no apparent nausea or vomiting Anesthetic complications: no    Last Vitals:  Vitals:   10/04/19 0730 10/04/19 0800  BP: (!) 166/137 (!) 150/72  Pulse: (!) 109 88  Resp: 16 (!) 26  Temp:    SpO2: 95% 96%    Last Pain:  Vitals:   10/04/19 0655  TempSrc: Oral                 Lannie Fields

## 2019-10-04 NOTE — Anesthesia Procedure Notes (Signed)
Procedure Name: Intubation Date/Time: 10/04/2019 5:39 AM Performed by: Vanessa [place], CRNA Pre-anesthesia Checklist: Patient identified, Emergency Drugs available, Suction available and Patient being monitored Patient Re-evaluated:Patient Re-evaluated prior to induction Oxygen Delivery Method: Circle system utilized Preoxygenation: Pre-oxygenation with 100% oxygen Induction Type: IV induction and Rapid sequence Ventilation: Mask ventilation without difficulty Laryngoscope Size: 2 and Miller Grade View: Grade I Tube type: Oral Tube size: 8.0 mm Number of attempts: 1 Airway Equipment and Method: Stylet Placement Confirmation: ETT inserted through vocal cords under direct vision,  positive ETCO2 and breath sounds checked- equal and bilateral Secured at: 22 cm Tube secured with: Tape Dental Injury: Teeth and Oropharynx as per pre-operative assessment

## 2019-10-04 NOTE — ED Notes (Signed)
[facility] arrived [facility]. EMS reports that pt had an episode of emesis on the way over.

## 2019-10-04 NOTE — Progress Notes (Signed)
In recovery, unable to wean patient from O2 without drop in Sats to 78-80%.  Patient was-88% preop from ED. MD made aware.  Portable chest xray already obtained per anesthes.  Per MD patient to be admitted.  Wife made aware.

## 2019-10-04 NOTE — Anesthesia Preprocedure Evaluation (Deleted)
Anesthesia Evaluation    Airway        Dental   Pulmonary former smoker,           Cardiovascular hypertension,      Neuro/Psych    GI/Hepatic   Endo/Other  diabetes  Renal/GU      Musculoskeletal   Abdominal   Peds  Hematology   Anesthesia Other Findings   Reproductive/Obstetrics                             Anesthesia Physical Anesthesia Plan Anesthesia Quick Evaluation  

## 2019-10-04 NOTE — Op Note (Signed)
[facility] Patient Name: Daniel PinesBrantley Morrow Procedure Date: 10/04/2019 MRN: [id number] Attending MD: Doristine LocksVito Ryota Treece , MD Date of Birth: 07/21/1941 CSN: [id number] Age: [age] Admit Type: Emergency Department Procedure:                Upper GI endoscopy Indications:              Dysphagia, Foreign body in the esophagus                           79 yo male presents with acute food impaction. Not                            tolerated oral secretions and no response to trial                            of Zofran and Glucagon. Similar presentation in                            12/2018. Providers:                Doristine LocksVito Lavender Stanke, MD, Norman ClayLisa Nunn, RN, Lawson Radararlene Guadron,                            Technician, Nadene Rubinson Cochran Referring MD:              Medicines:                Monitored Anesthesia Care Complications:            No immediate complications. Estimated Blood Loss:     Estimated blood loss: none. Procedure:                Pre-Anesthesia Assessment:                           - Prior to the procedure, a History and Physical                            was performed, and patient medications and                            allergies were reviewed. The patient's tolerance of                            previous anesthesia was also reviewed. The risks                            and benefits of the procedure and the sedation                            options and risks were discussed with the patient.                            All questions were answered, and informed consent  was obtained. Prior Anticoagulants: The patient has                            taken antiplatelet medication, last dose was 1 day                            prior to procedure. ASA Grade Assessment: III - A                            patient with severe systemic disease. After                            reviewing the risks and benefits, the patient was                            deemed in  satisfactory condition to undergo the                            procedure.                           After obtaining informed consent, the endoscope was                            passed under direct vision. Throughout the                            procedure, the patient's blood pressure, pulse, and                            oxygen saturations were monitored continuously. The                            GIF-H190 ([id number]) Olympus gastroscope was                            introduced through the mouth, and advanced to the                            second part of duodenum. The upper GI endoscopy was                            technically difficult and complex. The patient                            tolerated the procedure well. Shortly after                            administering sedation and prior to endoscope                            insertion, O2 saturation briefly decreased to 60's,  necessitating intubation for definitive airway. He                            quick.y resumed oxygen saturation in the high 90's                            for the remainder of the case, and otherwise                            remained hemodynamically stable. Scope In: Scope Out: Findings:      A large food bolus was found in the lower third of the esophagus.       Retrieval required multiple tools, to include rat tooth forceps, talon       grasper, Roth net. Eventually used suction cap, but this became       dislodged from the endoscope and required retrieval itself using the rat       tooth forceps. After several passes, eventually able to create an       opening to allow endoscope passage and cleared the remaining food debris       into the stomach using water jet. The esophagus was clear at the       conclusion of the procedure. There was moderate mucosal erythema and       inflammation noted at the area of food impaction.      One benign-appearing, intrinsic moderate  stenosis was found 41 cm from       the incisors. The stenosis was traversed.      A hiatal hernia was present.      A few small sessile polyps with no stigmata of recent bleeding were       found in the gastric body.      The exam of the stomach was otherwise normal.      The duodenal bulb, first portion of the duodenum and second portion of       the duodenum were normal. Impression:               - Food in the lower third of the esophagus.                           - Benign-appearing esophageal stenosis.                           - Hiatal hernia.                           - A few gastric polyps.                           - Normal duodenal bulb, first portion of the                            duodenum and second portion of the duodenum.                           - No specimens collected. Moderate Sedation:      Not Applicable - Patient had care per Anesthesia. Recommendation:           -  Return patient to ER for discharge same day.                           - Clear liquids today, then ok to proceed with full                            liquid diet tomorrow. Would not progress beyond                            full liquid diet until follow-up appointment, and                            likely no plan to progress beyond soft foods until                            repeat EGD with esophageal dilation as below.                           - Continue present medications.                           - Use sucralfate suspension 1 gram PO BID for 2                            weeks.                           - Return to GI clinic at appointment to be                            scheduled.                           - Repeat upper endoscopy with dilation in 4-6 weeks.                           - Increase Protonix to 20 mg BID for 6 weeks to                            promote mucosal healing, then can resume at 20                            mg/day for ongoing reflux control. Procedure Code(s):         --- Professional ---                           [phone], Esophagogastroduodenoscopy, flexible,                            transoral; diagnostic, including collection of                            specimen(s) by brushing or washing, when performed                            (  separate procedure) Diagnosis Code(s):        --- Professional ---                           [phone], Food in esophagus causing other injury,                            initial encounter                           K22.2, Esophageal obstruction                           K44.9, Diaphragmatic hernia without obstruction or                            gangrene                           K31.7, Polyp of stomach and duodenum                           R13.10, Dysphagia, unspecified                           T18.108A, Unspecified foreign body in esophagus                            causing other injury, initial encounter CPT copyright 2019 American [employer] Association. All rights reserved. The codes documented in this report are preliminary and upon coder review may  be revised to meet current compliance requirements. Doristine Locks, MD 10/04/2019 6:58:19 AM Number of Addenda: 0

## 2019-10-04 NOTE — Anesthesia Preprocedure Evaluation (Signed)
Anesthesia Evaluation  Patient identified by MRN, date of birth, ID band Patient awake    Reviewed: Allergy & Precautions, NPO status , Patient's Chart, lab work & pertinent test results  Airway Mallampati: III  TM Distance: >3 FB Neck ROM: Full    Dental  (+) Caps, Dental Advisory Given   Pulmonary shortness of breath, with exertion and at rest, pneumonia, resolved, former smoker,    Pulmonary exam normal breath sounds clear to auscultation       Cardiovascular hypertension, Pt. on medications Normal cardiovascular exam Rhythm:Regular Rate:Normal     Neuro/Psych PSYCHIATRIC DISORDERS Depression Dementia Aphasia  Dysphagia Parkinson's disease CVA, Residual Symptoms    GI/Hepatic hiatal hernia, GERD  Medicated and Controlled,Dysphagia Food impaction   Endo/Other  diabetes, Well Controlled, Type 2Hyperlipidemia  Renal/GU   negative genitourinary   Musculoskeletal  (+) Arthritis , Osteoarthritis,    Abdominal   Peds  Hematology negative hematology ROS (+)   Anesthesia Other Findings   Reproductive/Obstetrics                             Anesthesia Physical Anesthesia Plan  ASA: III and emergent  Anesthesia Plan: MAC   Post-op Pain Management:    Induction: Intravenous  PONV Risk Score and Plan: 1 and Ondansetron, Propofol infusion and Treatment may vary due to age or medical condition  Airway Management Planned: Natural Airway and Nasal Cannula  Additional Equipment:   Intra-op Plan:   Post-operative Plan:   Informed Consent: I have reviewed the patients History and Physical, chart, labs and discussed the procedure including the risks, benefits and alternatives for the proposed anesthesia with the patient or authorized representative who has indicated his/her understanding and acceptance.     Dental advisory given  Plan Discussed with: CRNA and Surgeon  Anesthesia Plan  Comments:         Anesthesia Quick Evaluation

## 2019-10-04 NOTE — Telephone Encounter (Signed)
Patient ended up getting admitted for hypoxia. Hopefully he can be discharged with carafate suspension when he recovers, but if not, will plan on slurry. I think the tablets will be too large for him. Thanks.

## 2019-10-04 NOTE — Plan of Care (Signed)
Bed alarm set, call bell placed within reach. Pt will attempt to call for assistance if needing to get out of bed.

## 2019-10-04 NOTE — Telephone Encounter (Signed)
Dr. Salena Saner, Please review previous message and advise

## 2019-10-04 NOTE — Telephone Encounter (Signed)
[facility] informed that most insurances do not cover Carafate suspension.  They will cover tablet or can make into slurry if pt cannot swallow tablets.

## 2019-10-04 NOTE — Discharge Instructions (Signed)
YOU HAD AN ENDOSCOPIC PROCEDURE TODAY: Refer to the procedure report and other information in the discharge instructions given to you for any specific questions about what was found during the examination. If this information does not answer your questions, please call [place] office at [phone] to clarify.   YOU SHOULD EXPECT: Some feelings of bloating in the abdomen. Passage of more gas than usual. Walking can help get rid of the air that was put into your GI tract during the procedure and reduce the bloating. Some abdominal soreness may be present for a day or two, also.  DIET: Your first meal following the procedure should be a light meal and then it is ok to progress to your normal diet. A half-sandwich or bowl of soup is an example of a good first meal. Heavy or fried foods are harder to digest and may make you feel nauseous or bloated. Drink plenty of fluids but you should avoid alcoholic beverages for 24 hours. If you had a esophageal dilation, please see attached instructions for diet.    ACTIVITY: Your care partner should take you home directly after the procedure. You should plan to take it easy, moving slowly for the rest of the day. You can resume normal activity the day after the procedure however YOU SHOULD NOT DRIVE, use power tools, machinery or perform tasks that involve climbing or major physical exertion for 24 hours (because of the sedation medicines used during the test).   SYMPTOMS TO REPORT IMMEDIATELY: A gastroenterologist can be reached at any hour. Please call [phone]  for any of the following symptoms:   Following upper endoscopy (EGD, EUS, ERCP, esophageal dilation) Vomiting of blood or coffee ground material  New, significant abdominal pain  New, significant chest pain or pain under the shoulder blades  Painful or persistently difficult swallowing  New shortness of breath  Black, tarry-looking or red, bloody stools  FOLLOW UP:  If any biopsies were taken you  will be contacted by phone or by letter within the next 1-3 weeks. Call [phone]  if you have not heard about the biopsies in 3 weeks.  Please also call with any specific questions about appointments or follow up tests.  

## 2019-10-04 NOTE — H&P (Signed)
History and Physical    Daniel Morrow:[id number] DOB: 12-Nov-1940 DOA: 10/03/2019  PCP: Kirby Funk, MD   Patient coming from: Home.  I have personally briefly reviewed patient's old medical records in [facility]  Chief Complaint: "Got steak stuck in throat and vomiting"  HPI: Daniel Morrow is a 79 y.o. male with medical history significant of aphasia, osteoarthritis of the knees, left eye blindness, chronic bronchitis, Alzheimer's dementia, depression, GERD, glaucoma, urolithiasis, hyperlipidemia, hypertension, history of pneumonia, history of recurring UTIs, history of nonhemorrhagic CVA, type 2 diabetes mellitus  who was brought to the emergency department at [facility] after having difficulty swallowing a piece of contrary steak around 2100, developing nausea with 2 episodes of emesis and after this began to drool with difficulty handling his secretions.  He had a similar episode in April 2020 and had to have an EGD performed to treat this episode.  GlucaGen was not successful in relieving the obstruction patient was brought to [facility] for EGD which was performed this morning which resolved in the issue.  However, the patient had an episode of hypoxemia while he was at the endoscopic suite and had to be food on [place] O2 4 LPM.  His chest radiograph reveals low volumes with opacity behind the heart which could be atelectasis or infiltrate.  He is unable to provide further history at this time.  ED Course: It was given GlucaGen as earlier stated and transferred to [facility] for urgent EGD.  His CBC was normal.  Glucose 178, creatinine 1.32 and calcium 8.6 mg/dL.  BUN and the rest of the electrolytes are within normal limits.  SARS coronavirus 2, influenza a and B by PCR was negative.  Review of Systems: As per HPI otherwise 10 point review of systems negative.   Past Medical History:  Diagnosis Date  . Aphagia   . Arthritis    "knees" (09/14/2018)  . Blind left eye   . Chronic bronchitis (HCC)    . Dementia (HCC)   . Depression   . GERD (gastroesophageal reflux disease)   . Glaucoma   . History of kidney stones   . Hyperlipidemia   . Hypertension   . Pneumonia    "maybe twice" (09/14/2018)  . Recurrent UTI (urinary tract infection)   . Stroke [facility]) 11/2011   "didn't effect him at the time" (09/14/2018)  . Type II diabetes mellitus (HCC)     Past Surgical History:  Procedure Laterality Date  . ABDOMINAL HERNIA REPAIR    . CATARACT EXTRACTION W/ INTRAOCULAR LENS  IMPLANT, BILATERAL Bilateral   . COLONOSCOPY    . ESOPHAGOGASTRODUODENOSCOPY N/A 01/11/2019   Procedure: ESOPHAGOGASTRODUODENOSCOPY (EGD);  Surgeon: Meryl Dare, MD;  Location[facility] ENDOSCOPY;  Service: Endoscopy;  Laterality: N/A;  . ESOPHAGOGASTRODUODENOSCOPY (EGD) WITH ESOPHAGEAL DILATION  X 2  . FOREIGN BODY REMOVAL  01/11/2019  . FRACTURE SURGERY    . HERNIA REPAIR    . PATELLA FRACTURE SURGERY Left   . POSTERIOR CERVICAL FUSION/FORAMINOTOMY       reports that he quit smoking about 41 years ago. His smoking use included cigarettes. He has a 20.00 pack-year smoking history. He quit smokeless tobacco use about 36 years ago. He reports that he does not drink alcohol or use drugs.  No Known Allergies  Family History  Problem Relation Age of Onset  . Polycythemia Mother   . Lung cancer Father   . Other Brother        brain tumor  .  Diabetes Daughter   . Colon cancer Neg Hx   . Esophageal cancer Neg Hx   . Rectal cancer Neg Hx   . Stomach cancer Neg Hx    Prior to Admission medications   Medication Sig Start Date End Date Taking? Authorizing Provider  atorvastatin (LIPITOR) 40 MG tablet Take 40 mg by mouth at bedtime.     [provider]  benazepril (LOTENSIN) 20 MG tablet Take 20 mg by mouth daily.     [provider]  brimonidine (ALPHAGAN) 0.2 % ophthalmic solution Place 1 drop into the right eye 2 (two) times daily.  02/25/18   [provider]  carbidopa-levodopa  (SINEMET IR) 25-100 MG tablet Take 1 tablet by mouth 3 (three) times daily. Patient not taking: Reported on 09/30/2019 12/06/18   Tat, Octaviano Batty, DO  colesevelam [facility]) 625 MG tablet 1,875 mg 2 (two) times daily with a meal.  12/22/18   [provider]  cyanocobalamin 100 MCG tablet Take 100 mcg by mouth daily.    [provider]  dipyridamole-aspirin (AGGRENOX) 200-25 MG 12hr capsule Take 1 capsule by mouth 2 (two) times daily.    [provider]  glimepiride (AMARYL) 4 MG tablet Take 4 mg by mouth daily.  06/12/16   [provider]  latanoprost (XALATAN) 0.005 % ophthalmic solution Place 1 drop into the right eye at bedtime.  06/02/16   [provider]  memantine (NAMENDA) 10 MG tablet Take 1 tablet (10 mg total) by mouth 2 (two) times daily. 08/24/18   Micki Riley, MD  Multiple Vitamin (MULTIVITAMIN WITH MINERALS) TABS tablet Take 1 tablet by mouth daily.    [provider]  pantoprazole (PROTONIX) 20 MG tablet Take 1 tablet (20 mg total) by mouth 2 (two) times daily. 10/04/19 11/18/19  Cirigliano, Vito V, DO  Semaglutide, 1 MG/DOSE, (OZEMPIC, 1 MG/DOSE,) 2 MG/1.5ML SOPN Inject 2 mg into the skin every Monday.    [provider]  sertraline (ZOLOFT) 100 MG tablet Take 1 tablet (100 mg total) by mouth daily. 03/02/17   Micki Riley, MD  sucralfate (CARAFATE) 1 GM/10ML suspension Take 10 mLs (1 g total) by mouth 4 (four) times daily. 10/04/19 10/03/20  Cirigliano, Vito V, DO  timolol (TIMOPTIC) 0.5 % ophthalmic solution Place 1 drop into the right eye 2 (two) times daily.    [provider]  traZODone (DESYREL) 50 MG tablet Take 50 mg by mouth at bedtime.    [provider]    Physical Exam: Vitals:   10/04/19 0700 10/04/19 0710 10/04/19 0720 10/04/19 0730  BP: (!) 150/82 126/77 (!) 172/42 (!) 166/137  Pulse: 93 95 (!) 101 (!) 109  Resp: (!) 28 13 (!) 21 16  Temp:      TempSrc:      SpO2: 94% 93% (!) 81% 95%    Weight:      Height:        Constitutional: NAD, calm, comfortable Eyes: PERRL, lids and conjunctivae normal ENMT: Mucous membranes are moist. Posterior pharynx clear of any exudate or lesions.Normal dentition.  Neck: normal, supple, no masses, no thyromegaly Respiratory: Poor inspiratory effort.  Decreased breath sounds on bases, no wheezing, no crackles. No accessory muscle use.  Cardiovascular: Regular rate and rhythm, no murmurs / rubs / gallops. No extremity edema. 2+ pedal pulses. No carotid bruits.  Abdomen: Nondistended.  Soft, no tenderness, no masses palpated. No hepatosplenomegaly. Bowel sounds positive.  Musculoskeletal: no clubbing / cyanosis.  Good  ROM, no contractures. Normal muscle tone.  Skin: no clinically significant rashes, lesions, ulcers on very limited dermatological examination. Neurologic: CN 2-12 grossly intact.  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 Psychiatric: Normal judgment and insight. Alert and oriented x 3. Normal mood.   Labs on Admission: I have personally reviewed following labs and imaging studies  CBC: Recent Labs  Lab 10/04/19 0125  WBC 6.2  NEUTROABS 4.6  HGB 13.0  HCT 41.6  MCV 89.3  PLT 211   Basic Metabolic Panel: Recent Labs  Lab 10/04/19 0125  NA 140  K 4.0  CL 103  CO2 27  GLUCOSE 178*  BUN 19  CREATININE 1.32*  CALCIUM 8.6*   GFR: Estimated Creatinine Clearance: 49.2 mL/min (A) (by C-G formula based on SCr of 1.32 mg/dL (H)). Liver Function Tests: No results for input(s): AST, ALT, ALKPHOS, BILITOT, PROT, ALBUMIN in the last 168 hours. No results for input(s): LIPASE, AMYLASE in the last 168 hours. No results for input(s): AMMONIA in the last 168 hours. Coagulation Profile: No results for input(s): INR, PROTIME in the last 168 hours. Cardiac Enzymes: No results for input(s): CKTOTAL, CKMB, CKMBINDEX, TROPONINI in the last 168 hours. BNP (last 3 results) No results for input(s): PROBNP in the last 8760  hours. HbA1C: No results for input(s): HGBA1C in the last 72 hours. CBG: No results for input(s): GLUCAP in the last 168 hours. Lipid Profile: No results for input(s): CHOL, HDL, LDLCALC, TRIG, CHOLHDL, LDLDIRECT in the last 72 hours. Thyroid Function Tests: No results for input(s): TSH, T4TOTAL, FREET4, T3FREE, THYROIDAB in the last 72 hours. Anemia Panel: No results for input(s): VITAMINB12, FOLATE, FERRITIN, TIBC, IRON, RETICCTPCT in the last 72 hours. Urine analysis:    Component Value Date/Time   COLORURINE YELLOW 01/10/2019 2341   APPEARANCEUR HAZY (A) 01/10/2019 2341   LABSPEC 1.021 01/10/2019 2341   PHURINE 5.0 01/10/2019 2341   GLUCOSEU NEGATIVE 01/10/2019 2341   HGBUR NEGATIVE 01/10/2019 2341   BILIRUBINUR NEGATIVE 01/10/2019 2341   KETONESUR NEGATIVE 01/10/2019 2341   PROTEINUR 100 (A) 01/10/2019 2341   UROBILINOGEN 0.2 12/02/2013 0611   NITRITE NEGATIVE 01/10/2019 2341   LEUKOCYTESUR NEGATIVE 01/10/2019 2341    Radiological Exams on Admission: DG Neck Soft Tissue  Result Date: 10/04/2019 CLINICAL DATA:  79 year old male with steak stuck in throat with subsequent vomiting. Continues to truly. History of stroke and dementia. EXAM: NECK SOFT TISSUES - 1+ VIEW COMPARISON:  CTA head and neck 06/20/2016. FINDINGS: C2-C3 interbody ankylosis and chronic cervical spine degeneration. Normal prevertebral soft tissue contour. Visible pharyngeal contours and epiglottis are within normal limits. Superimposed cervical carotid calcified atherosclerosis. No radiopaque foreign body identified. Mostly absent dentition. No acute osseous abnormality identified. Negative visible upper chest. IMPRESSION: Stable compared to a 2017 Neck CTA. No retained foreign body in the neck identified radiographically. Electronically Signed   By: Odessa Fleming M.D.   On: 10/04/2019 00:26   DG Chest Port 1 View  Result Date: 10/04/2019 CLINICAL DATA:  Question aspiration into airway after endoscopy EXAM: PORTABLE  CHEST 1 VIEW COMPARISON:  01/10/2019 FINDINGS: Low volume chest with indistinct retrocardiac opacity. Stable upper mediastinal widening likely accentuated by low volumes. Borderline heart size accentuated by technique. IMPRESSION: Low volume chest with opacity behind the heart which could be atelectasis or infiltrate. Electronically Signed   By: Marnee Spring M.D.   On: 10/04/2019 07:21    EKG: Independently reviewed.  Assessment/Plan Principal Problem:   Hypoxia Suspect possible aspiration. Telemetry/observation. Continue supplemental  oxygen. Start Unasyn 3 g IVPB every 6 hours. Incentive spirometry. Bronchodilators as needed. Follow-up O2 sat and WBC.  Active Problems:   Hyperlipidemia Hold atorvastatin until cleared by GI.    Hypertension IV antihypertensives until cleared for oral intake. Monitor blood pressure and heart rate.    Type II diabetes mellitus ([place]) On clear liquids diet. Oral medications held by GI. CBG monitoring with RI SS.    Alzheimer's disease ([place]) Supportive care.    Esophageal stricture Clear liquids for now. Protonix IV every 24 hours.   DVT prophylaxis: SCDs Code Status: Full code. Family Communication:  Disposition Plan: Post EGD observation for possible aspiration. Consults called: GI (Dr. Bryan Lemma). Admission status: Observation/telemetry.   Reubin Milan MD [facility]  If 7PM-7AM, please contact night-coverage [email or web address]  10/04/2019, 7:52 AM   This document was prepared using Dragon voice recognition software and may contain some unintended transcription errors.

## 2019-10-04 NOTE — ED Notes (Addendum)
Report given to: Gust Brooms, RN with [facility].

## 2019-10-04 NOTE — Transfer of Care (Signed)
Immediate Anesthesia Transfer of Care Note  Patient: Daniel Morrow  Procedure(s) Performed: ESOPHAGOGASTRODUODENOSCOPY (EGD) (Left ) IMPACTION REMOVAL  Patient Location: Endoscopy Unit  Anesthesia Type:General  Level of Consciousness: awake and patient cooperative  Airway & Oxygen Therapy: Patient Spontanous Breathing and Patient connected to face mask  Post-op Assessment: Report given to RN and Post -op Vital signs reviewed and stable  Post vital signs: Reviewed and stable  Last Vitals:  Vitals Value Taken Time  BP    Temp    Pulse    Resp 28 10/04/19 0655  SpO2    Vitals shown include unvalidated device data.  Last Pain:  Vitals:   10/04/19 0448  TempSrc: Oral         Complications: No apparent anesthesia complications

## 2019-10-04 NOTE — ED Notes (Signed)
Report given to: Marcelina Morel, RN at [facility] ED.

## 2019-10-05 ENCOUNTER — Encounter: Payer: Self-pay | Admitting: *Deleted

## 2019-10-05 DIAGNOSIS — R0902 Hypoxemia: Secondary | ICD-10-CM | POA: Diagnosis not present

## 2019-10-05 LAB — COMPREHENSIVE METABOLIC PANEL
ALT: 12 U/L (ref 0–44)
AST: 13 U/L — ABNORMAL LOW (ref 15–41)
Albumin: 3.6 g/dL (ref 3.5–5.0)
Alkaline Phosphatase: 91 U/L (ref 38–126)
Anion gap: 9 (ref 5–15)
BUN: 12 mg/dL (ref 8–23)
CO2: 29 mmol/L (ref 22–32)
Calcium: 8.8 mg/dL — ABNORMAL LOW (ref 8.9–10.3)
Chloride: 103 mmol/L (ref 98–111)
Creatinine, Ser: 1.02 mg/dL (ref 0.61–1.24)
GFR calc Af Amer: >60 mL/min (ref >60)
GFR calc non Af Amer: >60 mL/min (ref >60)
Glucose, Bld: 109 mg/dL — ABNORMAL HIGH (ref 70–99)
Potassium: 3.5 mmol/L (ref 3.5–5.1)
Sodium: 141 mmol/L (ref 135–145)
Total Bilirubin: 1.7 mg/dL — ABNORMAL HIGH (ref 0.3–1.2)
Total Protein: 6.5 g/dL (ref 6.5–8.1)

## 2019-10-05 LAB — CBC
HCT: 38.6 % — ABNORMAL LOW (ref 39.0–52.0)
Hemoglobin: 12.4 g/dL — ABNORMAL LOW (ref 13.0–17.0)
MCH: 28.7 pg (ref 26.0–34.0)
MCHC: 32.1 g/dL (ref 30.0–36.0)
MCV: 89.4 fL (ref 80.0–100.0)
Platelets: 190 K/uL (ref 150–400)
RBC: 4.32 MIL/uL (ref 4.22–5.81)
RDW: 14.5 % (ref 11.5–15.5)
WBC: 7.8 K/uL (ref 4.0–10.5)
nRBC: 0 % (ref 0.0–0.2)

## 2019-10-05 LAB — GLUCOSE, CAPILLARY
Glucose-Capillary: 118 mg/dL — ABNORMAL HIGH (ref 70–99)
Glucose-Capillary: 333 mg/dL — ABNORMAL HIGH (ref 70–99)

## 2019-10-05 LAB — MAGNESIUM: Magnesium: 1.8 mg/dL (ref 1.7–2.4)

## 2019-10-05 MED ORDER — AMLODIPINE BESYLATE 10 MG PO TABS: 10.0000 mg | ORAL_TABLET | Freq: Every day | ORAL | 1 refills | Status: DC

## 2019-10-05 MED ORDER — AMOXICILLIN-POT CLAVULANATE 250-62.5 MG/5ML PO SUSR: 250.0000 mg | Freq: Two times a day (BID) | ORAL | 0 refills | Status: AC

## 2019-10-05 MED ORDER — BENAZEPRIL HCL 10 MG PO TABS
20.0000 mg | ORAL_TABLET | Freq: Every day | ORAL | Status: DC
  Administered 2019-10-05: 20 mg via ORAL
  Filled 2019-10-05: qty 2

## 2019-10-05 NOTE — Discharge Summary (Signed)
Physician Discharge Summary  Daniel Morrow WUJ:[id number] DOB: 09/02/1941 DOA: 10/03/2019  PCP: Kirby FunkGriffin, John, MD  Admit date: 10/03/2019 Discharge date: 10/05/2019  Admitted From: Home Disposition:  Home  Discharge Condition:Stable CODE STATUS:FULL Diet recommendation:Full liquid diet  Brief/Interim Summary:  HPI: Daniel LamerBrantley L Clapp is a 79 y.o. male with medical history significant of aphasia, osteoarthritis of the knees, left eye blindness, chronic bronchitis, Alzheimer's dementia, depression, GERD, glaucoma, urolithiasis, hyperlipidemia, hypertension, history of pneumonia, history of recurring UTIs, history of nonhemorrhagic CVA, type 2 diabetes mellitus  who was [facility] at [facility] after having difficulty swallowing a piece of contrary steak around 2100, developing nausea with 2 episodes of emesis and after this began to drool with difficulty handling his secretions.  He had a similar episode in April 2020 and had to have an EGD performed to treat this episode.  GlucaGen was not successful in relieving the obstruction patient [facility] which was performed this morning which resolved in the issue.  However, the patient had an episode of hypoxemia while he was at the endoscopic suite and had to be food on [place] O2 4 LPM.  His chest radiograph reveals low volumes with opacity behind the heart which could be atelectasis or infiltrate.  He is unable to provide further history at this time.  ED Course: It was given GlucaGen as earlier stated and [facility].  His CBC was normal.  Glucose 178, creatinine 1.32 and calcium 8.6 mg/dL.  BUN and the rest of the electrolytes are within normal limits.  SARS coronavirus 2, influenza a and B by PCR was negative.  Hospital Course:  His hospital course was stable.  This morning he maintained saturation on room air.  Patient is aphasic on his baseline due to history of stroke and dementia.  As  mentioned above, his chest x-ray showed possible retrocardiac opacity.  He has been started on Augmentin for next few days.  GI has recommended full liquid diet until seen as an outpatient.  Started on Carafate and Protonix.  He is hemodynamically stable for discharge to home today.  Following problems were addressed during his hospitalization:  Hypoxia Suspect possible aspiration. This morning he was saturating fine on room air.  Started on Augmentin  Hyperlipidemia On atorvastatin   Hypertension On benazepril at home.  Hypertensive this morning.  Added amlodipine   Type II diabetes mellitus (HCC) Resume home meds   Alzheimer's disease (HCC) Supportive care.  On memantine    Esophageal stricture Clear liquids for now.  Upgrade to full liquid.  Needs to be on full liquid until seen by GI.  Also started on Protonix and Carafate  History of ischemic stroke: Aphasic.  Being followed by home health PT     Discharge Diagnoses:  Principal Problem:   Hypoxia Active Problems:   Hyperlipidemia   Hypertension   Type II diabetes mellitus (HCC)   Alzheimer's disease (HCC)   Esophageal stricture   Glaucoma    Discharge Instructions  Discharge Instructions    Diet general   Complete by: As directed    Full liquid diet   Discharge instructions   Complete by: As directed    1)Conitnue full liquid diet only until you are seen by gastroenterologist. 2)Take prescribed medications as instructed.   Increase activity slowly   Complete by: As directed      Allergies as of 10/05/2019   No Known Allergies  Medication List    TAKE these medications   amLODipine 10 MG tablet Commonly known as: NORVASC Take 1 tablet (10 mg total) by mouth daily.   amoxicillin-clavulanate 250-62.5 MG/5ML suspension Commonly known as: AUGMENTIN Take 5 mLs (250 mg total) by mouth 2 (two) times daily for 5 days.   atorvastatin 40 MG tablet Commonly known as: LIPITOR Take 40 mg by mouth  at bedtime.   benazepril 20 MG tablet Commonly known as: LOTENSIN Take 20 mg by mouth daily.   brimonidine 0.2 % ophthalmic solution Commonly known as: ALPHAGAN Place 1 drop into the right eye 2 (two) times daily.   colesevelam 625 MG tablet Commonly known as: WELCHOL Take 1,875 mg by mouth 2 (two) times daily with a meal.   cyanocobalamin 100 MCG tablet Take 100 mcg by mouth daily.   dipyridamole-aspirin 200-25 MG 12hr capsule Commonly known as: AGGRENOX Take 1 capsule by mouth 2 (two) times daily.   glimepiride 4 MG tablet Commonly known as: AMARYL Take 4 mg by mouth daily.   latanoprost 0.005 % ophthalmic solution Commonly known as: XALATAN Place 1 drop into the right eye at bedtime.   memantine 10 MG tablet Commonly known as: NAMENDA Take 1 tablet (10 mg total) by mouth 2 (two) times daily.   Ozempic (1 MG/DOSE) 2 MG/1.5ML Sopn Generic drug: Semaglutide (1 MG/DOSE) Inject 2 mg into the skin every Monday.   pantoprazole 20 MG tablet Commonly known as: PROTONIX Take 1 tablet (20 mg total) by mouth 2 (two) times daily. What changed:   when to take this  Another medication with the same name was removed. Continue taking this medication, and follow the directions you see here.   sertraline 100 MG tablet Commonly known as: ZOLOFT Take 1 tablet (100 mg total) by mouth daily.   sucralfate 1 GM/10ML suspension Commonly known as: Carafate Take 10 mLs (1 g total) by mouth 4 (four) times daily.   timolol 0.5 % ophthalmic solution Commonly known as: TIMOPTIC Place 1 drop into the right eye 2 (two) times daily.   traZODone 50 MG tablet Commonly known as: DESYREL Take 50 mg by mouth at bedtime.      Follow-up Information    Lavone Orn, MD. Schedule an appointment as soon as possible for a visit in 1 week(s).   Specialty: Internal Medicine Contact information: [address] [place] [address] [phone]          No Known  Allergies  Consultations: GI  Procedures/Studies: DG Neck Soft Tissue  Result Date: 10/04/2019 CLINICAL DATA:  78 year old male with steak stuck in throat with subsequent vomiting. Continues to truly. History of stroke and dementia. EXAM: NECK SOFT TISSUES - 1+ VIEW COMPARISON:  CTA head and neck 06/20/2016. FINDINGS: C2-C3 interbody ankylosis and chronic cervical spine degeneration. Normal prevertebral soft tissue contour. Visible pharyngeal contours and epiglottis are within normal limits. Superimposed cervical carotid calcified atherosclerosis. No radiopaque foreign body identified. Mostly absent dentition. No acute osseous abnormality identified. Negative visible upper chest. IMPRESSION: Stable compared to a 2017 Neck CTA. No retained foreign body in the neck identified radiographically. Electronically Signed   By: Genevie Ann M.D.   On: 10/04/2019 00:26   DG Chest Port 1 View  Result Date: 10/04/2019 CLINICAL DATA:  Question aspiration into airway after endoscopy EXAM: PORTABLE CHEST 1 VIEW COMPARISON:  01/10/2019 FINDINGS: Low volume chest with indistinct retrocardiac opacity. Stable upper mediastinal widening likely accentuated by low volumes. Borderline heart size accentuated  by technique. IMPRESSION: Low volume chest with opacity behind the heart which could be atelectasis or infiltrate. Electronically Signed   By: Marnee Spring M.D.   On: 10/04/2019 07:21      Subjective: Patient seen and examined at the bedside.  Hemodynamically stable for discharge.  I called the wife and discussed the discharge planning.  Discharge Exam: Vitals:   10/05/19 0518 10/05/19 1045  BP: (!) 191/94 (!) 175/97  Pulse: 73 85  Resp: 18 18  Temp: (!) 97.5 F (36.4 C) 98.8 F (37.1 C)  SpO2: 98% 93%   Vitals:   10/04/19 1352 10/04/19 2112 10/05/19 0518 10/05/19 1045  BP: (!) 167/91 (!) 170/88 (!) 191/94 (!) 175/97  Pulse: 91 92 73 85  Resp: (!) 25  18 18   Temp: 98.3 F (36.8 C) 98.2 F (36.8 C)  (!) 97.5 F (36.4 C) 98.8 F (37.1 C)  TempSrc: Oral Oral Oral Oral  SpO2: 97% 97% 98% 93%  Weight:      Height:        General: Pt is alert, awake, not in acute distress Cardiovascular: RRR, S1/S2 +, no rubs, no gallops Respiratory: CTA bilaterally, no wheezing, no rhonchi Abdominal: Soft, NT, ND, bowel sounds + Extremities: no edema, no cyanosis    The results of significant diagnostics from this hospitalization (including imaging, microbiology, ancillary and laboratory) are listed below for reference.     Microbiology: Recent Results (from the past 240 hour(s))  Respiratory Panel by RT PCR (Flu A&B, Covid) - Nasopharyngeal Swab     Status: None   Collection Time: 10/04/19 12:03 AM   Specimen: Nasopharyngeal Swab  Result Value Ref Range Status   SARS Coronavirus 2 by RT PCR NEGATIVE NEGATIVE Final    Comment: (NOTE) SARS-CoV-2 target nucleic acids are NOT DETECTED. The SARS-CoV-2 RNA is generally detectable in upper respiratoy specimens during the acute phase of infection. The lowest concentration of SARS-CoV-2 viral copies this assay can detect is 131 copies/mL. A negative result does not preclude SARS-Cov-2 infection and should not be used as the sole basis for treatment or other patient management decisions. A negative result may occur with  improper specimen collection/handling, submission of specimen other than nasopharyngeal swab, presence of viral mutation(s) within the areas targeted by this assay, and inadequate number of viral copies (<131 copies/mL). A negative result must be combined with clinical observations, patient history, and epidemiological information. The expected result is Negative. Fact Sheet for Patients:  12/02/19 Fact Sheet for Healthcare Providers:  https://www.moore.com/ This test is not yet ap proved or cleared by the https://www.young.biz/ FDA and  has been authorized for detection and/or  diagnosis of SARS-CoV-2 by FDA under an Emergency Use Authorization (EUA). This EUA will remain  in effect (meaning this test can be used) for the duration of the COVID-19 declaration under Section 564(b)(1) of the Act, 21 U.S.C. section 360bbb-3(b)(1), unless the authorization is terminated or revoked sooner.    Influenza A by PCR NEGATIVE NEGATIVE Final   Influenza B by PCR NEGATIVE NEGATIVE Final    Comment: (NOTE) The Xpert Xpress SARS-CoV-2/FLU/RSV assay is intended as an aid in  the diagnosis of influenza from Nasopharyngeal swab specimens and  should not be used as a sole basis for treatment. Nasal washings and  aspirates are unacceptable for Xpert Xpress SARS-CoV-2/FLU/RSV  testing. Fact Sheet for Patients: [place] Fact Sheet for Healthcare Providers: https://www.moore.com/ This test is not yet approved or cleared by the https://www.young.biz/ FDA and  has  been authorized for detection and/or diagnosis of SARS-CoV-2 by  FDA under an Emergency Use Authorization (EUA). This EUA will remain  in effect (meaning this test can be used) for the duration of the  Covid-19 declaration under Section 564(b)(1) of the Act, 21  U.S.C. section 360bbb-3(b)(1), unless the authorization is  terminated or revoked. Performed at [facility], [address] [place], [place] [address]      Labs: BNP (last 3 results) No results for input(s): BNP in the last 8760 hours. Basic Metabolic Panel: Recent Labs  Lab 10/04/19 0125 10/04/19 1050 10/05/19 0509  NA 140  --  141  K 4.0  --  3.5  CL 103  --  103  CO2 27  --  29  GLUCOSE 178*  --  109*  BUN 19  --  12  CREATININE 1.32*  --  1.02  CALCIUM 8.6*  --  8.8*  MG  --  1.5* 1.8   Liver Function Tests: Recent Labs  Lab 10/05/19 0509  AST 13*  ALT 12  ALKPHOS 91  BILITOT 1.7*  PROT 6.5  ALBUMIN 3.6   No results for input(s): LIPASE, AMYLASE in the last 168 hours. No results for  input(s): AMMONIA in the last 168 hours. CBC: Recent Labs  Lab 10/04/19 0125 10/05/19 0509  WBC 6.2 7.8  NEUTROABS 4.6  --   HGB 13.0 12.4*  HCT 41.6 38.6*  MCV 89.3 89.4  PLT 211 190   Cardiac Enzymes: No results for input(s): CKTOTAL, CKMB, CKMBINDEX, TROPONINI in the last 168 hours. BNP: Invalid input(s): POCBNP CBG: Recent Labs  Lab 10/04/19 1104 10/04/19 1643 10/04/19 2114 10/05/19 0743 10/05/19 1224  GLUCAP 138* 247* 133* 118* 333*   D-Dimer No results for input(s): DDIMER in the last 72 hours. Hgb A1c Recent Labs    10/04/19 1049  HGBA1C 7.0*   Lipid Profile No results for input(s): CHOL, HDL, LDLCALC, TRIG, CHOLHDL, LDLDIRECT in the last 72 hours. Thyroid function studies No results for input(s): TSH, T4TOTAL, T3FREE, THYROIDAB in the last 72 hours.  Invalid input(s): FREET3 Anemia work up No results for input(s): VITAMINB12, FOLATE, FERRITIN, TIBC, IRON, RETICCTPCT in the last 72 hours. Urinalysis    Component Value Date/Time   COLORURINE YELLOW 01/10/2019 2341   APPEARANCEUR HAZY (A) 01/10/2019 2341   LABSPEC 1.021 01/10/2019 2341   PHURINE 5.0 01/10/2019 2341   GLUCOSEU NEGATIVE 01/10/2019 2341   HGBUR NEGATIVE 01/10/2019 2341   BILIRUBINUR NEGATIVE 01/10/2019 2341   KETONESUR NEGATIVE 01/10/2019 2341   PROTEINUR 100 (A) 01/10/2019 2341   UROBILINOGEN 0.2 12/02/2013 0611   NITRITE NEGATIVE 01/10/2019 2341   LEUKOCYTESUR NEGATIVE 01/10/2019 2341   Sepsis Labs Invalid input(s): PROCALCITONIN,  WBC,  LACTICIDVEN Microbiology Recent Results (from the past 240 hour(s))  Respiratory Panel by RT PCR (Flu A&B, Covid) - Nasopharyngeal Swab     Status: None   Collection Time: 10/04/19 12:03 AM   Specimen: Nasopharyngeal Swab  Result Value Ref Range Status   SARS Coronavirus 2 by RT PCR NEGATIVE NEGATIVE Final    Comment: (NOTE) SARS-CoV-2 target nucleic acids are NOT DETECTED. The SARS-CoV-2 RNA is generally detectable in upper  respiratoy specimens during the acute phase of infection. The lowest concentration of SARS-CoV-2 viral copies this assay can detect is 131 copies/mL. A negative result does not preclude SARS-Cov-2 infection and should not be used as the sole basis for treatment or other patient management decisions. A negative result may occur with  improper specimen collection/handling, submission of specimen other than nasopharyngeal swab, presence of viral mutation(s) within the areas targeted by this assay, and inadequate number of viral copies (<131 copies/mL). A negative result must be combined with clinical observations, patient history, and epidemiological information. The expected result is Negative. Fact Sheet for Patients:  https://www.moore.com/ Fact Sheet for Healthcare Providers:  https://www.young.biz/ This test is not yet ap proved or cleared by the [place] FDA and  has been authorized for detection and/or diagnosis of SARS-CoV-2 by FDA under an Emergency Use Authorization (EUA). This EUA will remain  in effect (meaning this test can be used) for the duration of the COVID-19 declaration under Section 564(b)(1) of the Act, 21 U.S.C. section 360bbb-3(b)(1), unless the authorization is terminated or revoked sooner.    Influenza A by PCR NEGATIVE NEGATIVE Final   Influenza B by PCR NEGATIVE NEGATIVE Final    Comment: (NOTE) The Xpert Xpress SARS-CoV-2/FLU/RSV assay is intended as an aid in  the diagnosis of influenza from Nasopharyngeal swab specimens and  should not be used as a sole basis for treatment. Nasal washings and  aspirates are unacceptable for Xpert Xpress SARS-CoV-2/FLU/RSV  testing. Fact Sheet for Patients: https://www.moore.com/ Fact Sheet for Healthcare Providers: https://www.young.biz/ This test is not yet approved or cleared by the [place] FDA and  has been authorized for  detection and/or diagnosis of SARS-CoV-2 by  FDA under an Emergency Use Authorization (EUA). This EUA will remain  in effect (meaning this test can be used) for the duration of the  Covid-19 declaration under Section 564(b)(1) of the Act, 21  U.S.C. section 360bbb-3(b)(1), unless the authorization is  terminated or revoked. Performed at [facility], [address], [place], [place] [address]     Please note: You were cared for by a hospitalist during your hospital stay. Once you are discharged, your primary care physician will handle any further medical issues. Please note that NO REFILLS for any discharge medications will be authorized once you are discharged, as it is imperative that you return to your primary care physician (or establish a relationship with a primary care physician if you do not have one) for your post hospital discharge needs so that they can reassess your need for medications and monitor your lab values.    Time coordinating discharge: 40 minutes  SIGNED:   Burnadette Pop, MD  [facility] 10/05/2019, 1:03 PM Pager [phone]  If 7PM-7AM, please contact night-coverage [email or web address] Password [email or web address]

## 2019-10-05 NOTE — Plan of Care (Signed)
  Problem: Clinical Measurements: Goal: Ability to maintain clinical measurements within normal limits will improve Outcome: Progressing Goal: Will remain free from infection Outcome: Progressing Goal: Diagnostic test results will improve Outcome: Progressing Goal: Respiratory complications will improve Outcome: Progressing Goal: Cardiovascular complication will be avoided Outcome: Progressing   Problem: Elimination: Goal: Will not experience complications related to urinary retention Outcome: Progressing   Problem: Pain Managment: Goal: General experience of comfort will improve Outcome: Progressing   Problem: Safety: Goal: Ability to remain free from injury will improve Outcome: Progressing   Problem: Skin Integrity: Goal: Risk for impaired skin integrity will decrease Outcome: Progressing   

## 2019-10-05 NOTE — Progress Notes (Signed)
Discharge instructions were reviewed with patients spouse Daniel Morrow. Questions, concerns denied at this time. Pt remains two assist with walker. Spouse request to transport patient home. Ambulance suggested for safety. Spouse aware and agreeable to transport patient. Pt stable, no change from am assessment.

## 2019-10-06 ENCOUNTER — Telehealth: Payer: Self-pay

## 2019-10-06 NOTE — Telephone Encounter (Signed)
Left message for patient to return my call.

## 2019-10-06 NOTE — Telephone Encounter (Signed)
Pt's wife returned your call.  °

## 2019-10-06 NOTE — Telephone Encounter (Signed)
-----   Message from [place] V, DO sent at 10/04/2019  4:24 PM EST ----- Regarding: RE: [facility] ER f/u Patient ended up getting admitted for pneumonia as well, so will likely be a couple of days before he is discharged and able to set up outpatient care.  Thank you for helping me out with coordinating! ----- Message ----- From: Meryl Dare, MD Sent: 10/04/2019   8:15 AM EST To: Jessee Avers, CMA, Vito Lajean Silvius, DO Subject: RE: [facility] f/u                                  OK thanks. Marchelle Folks, please schedule patient as outlined.  ----- Message ----- From: Shellia Cleverly, DO Sent: 10/04/2019   6:59 AM EST To: Meryl Dare, MD Subject: [facility] ER f/u                                      79 yo male with significant food impaction. Will need f/u in clinic in 3-4 weeks and repeat EGD with dilation in 4-6 weeks. I will increase his PPI to BID, adding Carafate, and advised to stay on a liquid diet till f/u appt, and no more than softs until repeat EGD. Should be heading home from ER some time this morning.

## 2019-10-07 NOTE — Telephone Encounter (Signed)
Pharmacist from [facility] called to change carafate suspension to tablets.  He stated that suspension was over $400.

## 2019-10-10 NOTE — Telephone Encounter (Signed)
Patient scheduled for f/u with Dr. Russella Dar on 10/27/19 and repeat EGD dilation on 11/09/19. Patient's wife informed of appointments.

## 2019-10-14 DIAGNOSIS — R0902 Hypoxemia: Secondary | ICD-10-CM | POA: Diagnosis not present

## 2019-10-14 DIAGNOSIS — T18128D Food in esophagus causing other injury, subsequent encounter: Secondary | ICD-10-CM | POA: Diagnosis not present

## 2019-10-14 DIAGNOSIS — G3101 Pick's disease: Secondary | ICD-10-CM | POA: Diagnosis not present

## 2019-10-16 DIAGNOSIS — E1142 Type 2 diabetes mellitus with diabetic polyneuropathy: Secondary | ICD-10-CM | POA: Diagnosis not present

## 2019-10-16 DIAGNOSIS — E785 Hyperlipidemia, unspecified: Secondary | ICD-10-CM | POA: Diagnosis not present

## 2019-10-16 DIAGNOSIS — N182 Chronic kidney disease, stage 2 (mild): Secondary | ICD-10-CM | POA: Diagnosis not present

## 2019-10-16 DIAGNOSIS — I10 Essential (primary) hypertension: Secondary | ICD-10-CM | POA: Diagnosis not present

## 2019-10-16 DIAGNOSIS — I129 Hypertensive chronic kidney disease with stage 1 through stage 4 chronic kidney disease, or unspecified chronic kidney disease: Secondary | ICD-10-CM | POA: Diagnosis not present

## 2019-10-16 DIAGNOSIS — G309 Alzheimer's disease, unspecified: Secondary | ICD-10-CM | POA: Diagnosis not present

## 2019-10-16 DIAGNOSIS — R69 Illness, unspecified: Secondary | ICD-10-CM | POA: Diagnosis not present

## 2019-10-16 DIAGNOSIS — G3101 Pick's disease: Secondary | ICD-10-CM | POA: Diagnosis not present

## 2019-10-16 DIAGNOSIS — E1165 Type 2 diabetes mellitus with hyperglycemia: Secondary | ICD-10-CM | POA: Diagnosis not present

## 2019-10-16 DIAGNOSIS — M170 Bilateral primary osteoarthritis of knee: Secondary | ICD-10-CM | POA: Diagnosis not present

## 2019-10-16 DIAGNOSIS — E1122 Type 2 diabetes mellitus with diabetic chronic kidney disease: Secondary | ICD-10-CM | POA: Diagnosis not present

## 2019-10-16 DIAGNOSIS — H547 Unspecified visual loss: Secondary | ICD-10-CM | POA: Diagnosis not present

## 2019-10-16 DIAGNOSIS — H409 Unspecified glaucoma: Secondary | ICD-10-CM | POA: Diagnosis not present

## 2019-10-16 DIAGNOSIS — G3109 Other frontotemporal dementia: Secondary | ICD-10-CM | POA: Diagnosis not present

## 2019-10-16 DIAGNOSIS — K222 Esophageal obstruction: Secondary | ICD-10-CM | POA: Diagnosis not present

## 2019-10-21 ENCOUNTER — Telehealth: Payer: Self-pay | Admitting: Gastroenterology

## 2019-10-21 ENCOUNTER — Ambulatory Visit: Payer: Medicare HMO | Admitting: Gastroenterology

## 2019-10-21 DIAGNOSIS — G3101 Pick's disease: Secondary | ICD-10-CM | POA: Diagnosis not present

## 2019-10-21 DIAGNOSIS — M170 Bilateral primary osteoarthritis of knee: Secondary | ICD-10-CM | POA: Diagnosis not present

## 2019-10-21 DIAGNOSIS — G309 Alzheimer's disease, unspecified: Secondary | ICD-10-CM | POA: Diagnosis not present

## 2019-10-21 DIAGNOSIS — R69 Illness, unspecified: Secondary | ICD-10-CM | POA: Diagnosis not present

## 2019-10-21 DIAGNOSIS — N182 Chronic kidney disease, stage 2 (mild): Secondary | ICD-10-CM | POA: Diagnosis not present

## 2019-10-21 DIAGNOSIS — I129 Hypertensive chronic kidney disease with stage 1 through stage 4 chronic kidney disease, or unspecified chronic kidney disease: Secondary | ICD-10-CM | POA: Diagnosis not present

## 2019-10-21 DIAGNOSIS — H547 Unspecified visual loss: Secondary | ICD-10-CM | POA: Diagnosis not present

## 2019-10-21 DIAGNOSIS — K222 Esophageal obstruction: Secondary | ICD-10-CM | POA: Diagnosis not present

## 2019-10-21 DIAGNOSIS — E785 Hyperlipidemia, unspecified: Secondary | ICD-10-CM | POA: Diagnosis not present

## 2019-10-21 DIAGNOSIS — E1122 Type 2 diabetes mellitus with diabetic chronic kidney disease: Secondary | ICD-10-CM | POA: Diagnosis not present

## 2019-10-21 NOTE — Telephone Encounter (Signed)
Left message to call back.  Pts wife wanted to know if she could give him some soft foods to eat. Discussed with her that per the procedure report he was to stay on full liquids until his return OV next week. She verbalized understanding.

## 2019-10-23 ENCOUNTER — Emergency Department (HOSPITAL_BASED_OUTPATIENT_CLINIC_OR_DEPARTMENT_OTHER): Payer: Medicare HMO

## 2019-10-23 ENCOUNTER — Emergency Department (HOSPITAL_BASED_OUTPATIENT_CLINIC_OR_DEPARTMENT_OTHER)
Admission: EM | Admit: 2019-10-23 | Discharge: 2019-10-23 | Disposition: A | Discharge status: Home / Self Care | Payer: Medicare HMO | Attending: Emergency Medicine | Admitting: Emergency Medicine

## 2019-10-23 ENCOUNTER — Encounter (HOSPITAL_BASED_OUTPATIENT_CLINIC_OR_DEPARTMENT_OTHER): Payer: Self-pay

## 2019-10-23 ENCOUNTER — Other Ambulatory Visit: Payer: Self-pay

## 2019-10-23 DIAGNOSIS — Z87891 Personal history of nicotine dependence: Secondary | ICD-10-CM | POA: Insufficient documentation

## 2019-10-23 DIAGNOSIS — I10 Essential (primary) hypertension: Secondary | ICD-10-CM | POA: Insufficient documentation

## 2019-10-23 DIAGNOSIS — G309 Alzheimer's disease, unspecified: Secondary | ICD-10-CM | POA: Insufficient documentation

## 2019-10-23 DIAGNOSIS — E119 Type 2 diabetes mellitus without complications: Secondary | ICD-10-CM | POA: Insufficient documentation

## 2019-10-23 DIAGNOSIS — Z79899 Other long term (current) drug therapy: Secondary | ICD-10-CM | POA: Diagnosis not present

## 2019-10-23 DIAGNOSIS — R69 Illness, unspecified: Secondary | ICD-10-CM | POA: Diagnosis not present

## 2019-10-23 DIAGNOSIS — R0989 Other specified symptoms and signs involving the circulatory and respiratory systems: Secondary | ICD-10-CM | POA: Diagnosis present

## 2019-10-23 DIAGNOSIS — Z7984 Long term (current) use of oral hypoglycemic drugs: Secondary | ICD-10-CM | POA: Insufficient documentation

## 2019-10-23 DIAGNOSIS — E785 Hyperlipidemia, unspecified: Secondary | ICD-10-CM | POA: Diagnosis not present

## 2019-10-23 DIAGNOSIS — K222 Esophageal obstruction: Secondary | ICD-10-CM | POA: Diagnosis not present

## 2019-10-23 DIAGNOSIS — F0280 Dementia in other diseases classified elsewhere without behavioral disturbance: Secondary | ICD-10-CM | POA: Insufficient documentation

## 2019-10-23 DIAGNOSIS — Z8673 Personal history of transient ischemic attack (TIA), and cerebral infarction without residual deficits: Secondary | ICD-10-CM | POA: Insufficient documentation

## 2019-10-23 DIAGNOSIS — R918 Other nonspecific abnormal finding of lung field: Secondary | ICD-10-CM | POA: Diagnosis not present

## 2019-10-23 NOTE — ED Provider Notes (Signed)
[facility] EMERGENCY DEPARTMENT Provider Note   CSN: [id number] Arrival date & time: 10/23/19  2142     History Chief Complaint  Patient presents with  . Food Impaction    Daniel Morrow is a 79 y.o. male.  HPI   This patient is a 79 year old male, he has dementia and is unable to give any further history, level 5 caveat applies.  The information is obtained from his wife who is the primary historian.  The patient admitted on January 12 after having a food impaction and required a procedure to stretch his esophagus, he has a history of esophageal stenosis and has required stretching procedures in the past.  The food impaction resolved and he was told to come back for a formal dilation later.  This is supposed to be scheduled later this month and he is supposed to see his gastroenterologist in 5 days, Dr. Russella Dar.  The wife was asked not to give the patient anything but thickened liquids, she even reached out to the gastroenterologist several days ago and was again told only to use thickened liquids however this evening she made him some sloppy potatoes and very well cooked carrots, she was concerned that he may have had a food impaction, it is not clear why, she states that after she gave him that she gave him plenty of liquids to drink, ginger ale, applesauce and yogurt and he tolerated that without any difficulty, there is no vomiting or any feeling or appearance of being ill or having a recurrent infection.  Past Medical History:  Diagnosis Date  . Aphagia   . Arthritis    "knees" (09/14/2018)  . Blind left eye   . Chronic bronchitis (HCC)   . Dementia (HCC)   . Depression   . GERD (gastroesophageal reflux disease)   . Glaucoma   . History of kidney stones   . Hyperlipidemia   . Hypertension   . Pneumonia    "maybe twice" (09/14/2018)  . Recurrent UTI (urinary tract infection)   . Stroke [facility]) 11/2011   "didn't effect him at the time" (09/14/2018)  . Type II  diabetes mellitus [facility])     Patient Active Problem List   Diagnosis Date Noted  . Hypoxia 10/04/2019  . Glaucoma 10/04/2019  . Foreign body in esophagus   . Esophageal stenosis   . Hiatal hernia   . Chronic anticoagulation   . Esophageal dysphagia   . Esophageal stricture   . Shortness of breath at rest 09/12/2018  . Shortness of breath 09/12/2018  . Vision, loss, sudden 06/03/2016  . CRAO (central retinal artery occlusion) 06/03/2016  . Alzheimer's disease (HCC) 01/16/2016  . Epididymo-orchitis 12/02/2013  . Orchitis and epididymitis 12/02/2013  . Mild cognitive impairment, so stated 02/17/2013  . CVA (cerebral infarction) 02/17/2013  . 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 Hypertension 04/03/2012  . Type II diabetes mellitus (HCC) 04/03/2012  . Hyperlipidemia     Past Surgical History:  Procedure Laterality Date  . ABDOMINAL HERNIA REPAIR    . CATARACT EXTRACTION W/ INTRAOCULAR LENS  IMPLANT, BILATERAL Bilateral   . COLONOSCOPY    . ESOPHAGOGASTRODUODENOSCOPY N/A 01/11/2019   Procedure: ESOPHAGOGASTRODUODENOSCOPY (EGD);  Surgeon: Meryl Dare, MD;  Location: [facility] ENDOSCOPY;  Service: Endoscopy;  Laterality: N/A;  . ESOPHAGOGASTRODUODENOSCOPY Left 10/04/2019   Procedure: ESOPHAGOGASTRODUODENOSCOPY (EGD);  Surgeon: Shellia Cleverly, DO;  Location: [facility]  ENDOSCOPY;  Service: Gastroenterology;  Laterality: Left;  . ESOPHAGOGASTRODUODENOSCOPY (EGD) WITH ESOPHAGEAL DILATION  X 2  . FOREIGN BODY REMOVAL  01/11/2019  . FRACTURE SURGERY    . HERNIA REPAIR    . IMPACTION REMOVAL  10/04/2019   Procedure: IMPACTION REMOVAL;  Surgeon: Shellia Cleverly, DO;  Location: [facility] ENDOSCOPY;  Service: Gastroenterology;;  . PATELLA FRACTURE SURGERY Left   . POSTERIOR CERVICAL FUSION/FORAMINOTOMY         Family History  Problem Relation Age of Onset  .  Polycythemia Mother   . Lung cancer Father   . Other Brother        brain tumor  . Diabetes Daughter   . Colon cancer Neg Hx   . Esophageal cancer Neg Hx   . Rectal cancer Neg Hx   . Stomach cancer Neg Hx     Social History   Tobacco Use  . Smoking status: Former Smoker    Packs/day: 1.00    Years: 20.00    Pack years: 20.00    Types: Cigarettes    Quit date: 1980    Years since quitting: 41.1  . Smokeless tobacco: Former Neurosurgeon    Quit date: 1985  Substance Use Topics  . Alcohol use: No  . Drug use: Never    Home Medications Prior to Admission medications   Medication Sig Start Date End Date Taking? Authorizing Provider  amLODipine (NORVASC) 10 MG tablet Take 1 tablet (10 mg total) by mouth daily. 10/05/19 10/04/20  Burnadette Pop, MD  atorvastatin (LIPITOR) 40 MG tablet Take 40 mg by mouth at bedtime.     [provider]  benazepril (LOTENSIN) 20 MG tablet Take 20 mg by mouth daily.     [provider]  brimonidine (ALPHAGAN) 0.2 % ophthalmic solution Place 1 drop into the right eye 2 (two) times daily.  02/25/18   [provider]  colesevelam (WELCHOL) 625 MG tablet Take 1,875 mg by mouth 2 (two) times daily with a meal.  12/22/18   [provider]  cyanocobalamin 100 MCG tablet Take 100 mcg by mouth daily.    [provider]  dipyridamole-aspirin (AGGRENOX) 200-25 MG 12hr capsule Take 1 capsule by mouth 2 (two) times daily.    [provider]  glimepiride (AMARYL) 4 MG tablet Take 4 mg by mouth daily.  06/12/16   [provider]  latanoprost (XALATAN) 0.005 % ophthalmic solution Place 1 drop into the right eye at bedtime.  06/02/16   [provider]  memantine (NAMENDA) 10 MG tablet Take 1 tablet (10 mg total) by mouth 2 (two) times daily. 08/24/18   Micki Riley, MD  pantoprazole (PROTONIX) 20 MG tablet Take 1 tablet (20 mg total) by mouth 2 (two) times daily. 10/04/19 11/18/19  Cirigliano, Vito V, DO    Semaglutide, 1 MG/DOSE, (OZEMPIC, 1 MG/DOSE,) 2 MG/1.5ML SOPN Inject 2 mg into the skin every Monday.    [provider]  sertraline (ZOLOFT) 100 MG tablet Take 1 tablet (100 mg total) by mouth daily. 03/02/17   Micki Riley, MD  sucralfate (CARAFATE) 1 GM/10ML suspension Take 10 mLs (1 g total) by mouth 4 (four) times daily. 10/04/19 10/03/20  Cirigliano, Vito V, DO  timolol (TIMOPTIC) 0.5 % ophthalmic solution Place 1 drop into the right eye 2 (two) times daily.    [provider]  traZODone (DESYREL) 50 MG tablet Take 50 mg by mouth at bedtime.    [provider]  Allergies    Patient has no known allergies.  Review of Systems   Review of Systems  Unable to perform ROS: Dementia    Physical Exam Updated Vital Signs BP (!) 164/87   Pulse 73   Temp 98.2 F (36.8 C) (Oral)   Resp 18   Ht 1.778 m (5\' 10" )   Wt 80.7 kg   SpO2 97%   BMI 25.53 kg/m   Physical Exam Vitals and nursing note reviewed.  Constitutional:      General: He is not in acute distress.    Appearance: He is well-developed.  HENT:     Head: Normocephalic and atraumatic.     Mouth/Throat:     Pharynx: No oropharyngeal exudate.  Eyes:     General: No scleral icterus.       Right eye: No discharge.        Left eye: No discharge.     Conjunctiva/sclera: Conjunctivae normal.     Pupils: Pupils are equal, round, and reactive to light.  Neck:     Thyroid: No thyromegaly.     Vascular: No JVD.  Cardiovascular:     Rate and Rhythm: Normal rate and regular rhythm.     Heart sounds: Normal heart sounds. No murmur. No friction rub. No gallop.   Pulmonary:     Effort: Pulmonary effort is normal. No respiratory distress.     Breath sounds: Normal breath sounds. No wheezing or rales.  Abdominal:     General: Bowel sounds are normal. There is no distension.     Palpations: Abdomen is soft. There is no mass.     Tenderness: There is no abdominal tenderness.  Musculoskeletal:         General: No tenderness. Normal range of motion.     Cervical back: Normal range of motion and neck supple.  Lymphadenopathy:     Cervical: No cervical adenopathy.  Skin:    General: Skin is warm and dry.     Findings: No erythema or rash.  Neurological:     Mental Status: He is alert.     Coordination: Coordination normal.  Psychiatric:        Behavior: Behavior normal.     ED Results / Procedures / Treatments   Labs (all labs ordered are listed, but only abnormal results are displayed) Labs Reviewed - No data to display  EKG None  Radiology DG Chest 2 View  Result Date: 10/23/2019 CLINICAL DATA:  Food impaction. EXAM: CHEST - 2 VIEW COMPARISON:  10/04/2019 FINDINGS: There is streaky bibasilar airspace opacities, similar to prior study. There is no pneumothorax. No large pleural effusion. The lung volumes are somewhat low. There is mild elevation of the right hemidiaphragm. The heart size is stable. There is no acute osseous abnormality. IMPRESSION: Similar streaky bibasilar airspace opacities favored to represent atelectasis with an infiltrate not entirely excluded. Electronically Signed   By: 12/02/2019 M.D.   On: 10/23/2019 22:23    Procedures Procedures (including critical care time)  Medications Ordered in ED Medications - No data to display  ED Course  I have reviewed the triage vital signs and the nursing notes.  Pertinent labs & imaging results that were available during my care of the patient were reviewed by me and considered in my medical decision making (see chart for details).    MDM Rules/Calculators/A&P                      This  patient appears well, he will have an x-ray to make sure there is no signs of air-fluid levels, or obstructive esophageal appearance however he appears well clinically, vital signs are reassuring, oropharynx is clear, tolerating secretions and well-appearing, anticipate the ability to be discharged, the wife is now doubly  aware that she should only give him the advised diet.  Final Clinical Impression(s) / ED Diagnoses Final diagnoses:  Esophageal stricture    Rx / DC Orders ED Discharge Orders    None       Noemi Chapel, MD 10/23/19 2254

## 2019-10-23 NOTE — Discharge Instructions (Signed)
Please abide by the required diet, do not give any solid foods, follow-up with your gastroenterologist at the appointment on Friday, x-ray was reassuring  Return for severe or worsening choking, vomiting or chest pain or any other worsening or concerning symptoms.

## 2019-10-23 NOTE — ED Notes (Signed)
ED Provider at bedside. Dr Miller  

## 2019-10-23 NOTE — ED Notes (Signed)
Pt given saltine crackers and water per order. Wife assisting pt at bedside. Pt able to eat and swallow 2 crackers without difficulty

## 2019-10-23 NOTE — ED Triage Notes (Addendum)
On 1/11 pt had a food impaction due to a piece of steak. Pt has been on a full liquid diet until tonight when he had some potatoes. Pt felt like the potatoes became stuck again. Pt has not had any vomiting tonight, and has been able to drink liquids.

## 2019-10-24 DIAGNOSIS — N182 Chronic kidney disease, stage 2 (mild): Secondary | ICD-10-CM | POA: Diagnosis not present

## 2019-10-24 DIAGNOSIS — E1122 Type 2 diabetes mellitus with diabetic chronic kidney disease: Secondary | ICD-10-CM | POA: Diagnosis not present

## 2019-10-24 DIAGNOSIS — K222 Esophageal obstruction: Secondary | ICD-10-CM | POA: Diagnosis not present

## 2019-10-24 DIAGNOSIS — I129 Hypertensive chronic kidney disease with stage 1 through stage 4 chronic kidney disease, or unspecified chronic kidney disease: Secondary | ICD-10-CM | POA: Diagnosis not present

## 2019-10-24 DIAGNOSIS — G309 Alzheimer's disease, unspecified: Secondary | ICD-10-CM | POA: Diagnosis not present

## 2019-10-24 DIAGNOSIS — M170 Bilateral primary osteoarthritis of knee: Secondary | ICD-10-CM | POA: Diagnosis not present

## 2019-10-24 DIAGNOSIS — E785 Hyperlipidemia, unspecified: Secondary | ICD-10-CM | POA: Diagnosis not present

## 2019-10-24 DIAGNOSIS — G3101 Pick's disease: Secondary | ICD-10-CM | POA: Diagnosis not present

## 2019-10-24 DIAGNOSIS — R69 Illness, unspecified: Secondary | ICD-10-CM | POA: Diagnosis not present

## 2019-10-24 DIAGNOSIS — H547 Unspecified visual loss: Secondary | ICD-10-CM | POA: Diagnosis not present

## 2019-10-27 ENCOUNTER — Telehealth: Payer: Self-pay

## 2019-10-27 ENCOUNTER — Other Ambulatory Visit: Payer: Self-pay

## 2019-10-27 ENCOUNTER — Encounter: Payer: Self-pay | Admitting: Gastroenterology

## 2019-10-27 ENCOUNTER — Ambulatory Visit: Payer: Medicare HMO | Admitting: Gastroenterology

## 2019-10-27 VITALS — BP 140/70 | HR 68 | Temp 98.4°F | Ht 70.0 in | Wt 174.0 lb

## 2019-10-27 DIAGNOSIS — R1310 Dysphagia, unspecified: Secondary | ICD-10-CM | POA: Diagnosis not present

## 2019-10-27 DIAGNOSIS — Z7902 Long term (current) use of antithrombotics/antiplatelets: Secondary | ICD-10-CM

## 2019-10-27 DIAGNOSIS — K222 Esophageal obstruction: Secondary | ICD-10-CM | POA: Diagnosis not present

## 2019-10-27 NOTE — Patient Instructions (Signed)
You have been scheduled for an endoscopy. Please follow written instructions given to you at your visit today. If you use inhalers (even only as needed), please bring them with you on the day of your procedure.  Thank you for choosing me and [facility].  Malcolm T. Stark, Jr., MD., FACG  

## 2019-10-27 NOTE — Telephone Encounter (Signed)
   Daniel Morrow June 07, 1941 [id number]  Dear Dr. Valentina Lucks:  We have scheduled the above named patient for a(n) Upper Endoscopy procedure. Our records show that (s)he is on anticoagulation therapy.  Please advise as to whether the patient may come off their therapy of aggrenox  5 days prior to their procedure which is scheduled for 11/15/19.  Please route your response to Christie Nottingham, CMA or fax response to [phone].  Sincerely,    [facility]

## 2019-10-27 NOTE — Progress Notes (Signed)
    History of Present Illness: This is a 79 year old male returning for follow-up of dysphagia and food impaction.  He is accompanied by his wife.  His wife provides the majority of the history.  EGD with food impaction removal on January 12 as outlined below.  He has been only on liquids and some soft foods since with no difficulty swallowing.  He is also taking protein shakes twice daily.  He is scheduled to receive his second dose of Pfizer SARS-CoV-2 vaccine today.  EGD 10/04/2019 - Food in the lower third of the esophagus. Removed. - Benign-appearing esophageal stenosis. - Hiatal hernia. - A few gastric polyps. - Normal duodenal bulb, first portion of the duodenum and second portion of the duodenum.   Current Medications, Allergies, Past Medical History, Past Surgical History, Family History and Social History were reviewed in [employer] record.   Physical Exam: General: Well developed, well nourished, no acute distress Head: Normocephalic and atraumatic Eyes:  sclerae anicteric, EOMI Ears: Normal auditory acuity Mouth: No deformity or lesions Lungs: Clear throughout to auscultation Heart: Regular rate and rhythm; no murmurs, rubs or bruits Abdomen: Soft, non tender and non distended. No masses, hepatosplenomegaly or hernias noted. Normal Bowel sounds Rectal: Not done  Musculoskeletal: Symmetrical with no gross deformities  Pulses:  Normal pulses noted Extremities: No clubbing, cyanosis, edema or deformities noted Neurological: Alert oriented x 4, grossly nonfocal Psychological:  Alert and cooperative. Normal mood and affect   Assessment and Recommendations:  1.  Esophageal stricture with recent food impaction.  GERD.  Continue pantoprazole 20 mg p.o. twice daily.  Follow antireflux measures long-term.  Full liquid to soft foods only, avoiding all meat and bread, until after dilation.  Schedule EGD with dilation 2 weeks or more into the future for  maximal SARS-CoV-2 vaccine efficacy.  The risks (including bleeding, perforation, infection, missed lesions, medication reactions and possible hospitalization or surgery if complications occur), benefits, and alternatives to endoscopy with possible biopsy and possible dilation were discussed with the patient and they consent to proceed.   2.  History of CVA, CRAO.  Maintained on Aggrenox. Hold Aggrenox 5 days before procedure - will instruct when and how to resume after procedure. Low but real risk of cardiovascular event such as heart attack, stroke, embolism, thrombosis or ischemia/infarct of other organs off Aggrenox explained and need to seek urgent help if this occurs. The patient consents to proceed. Will communicate by phone or EMR with patient's prescribing provider to confirm that holding Aggrenox is reasonable in this case.

## 2019-10-28 DIAGNOSIS — N182 Chronic kidney disease, stage 2 (mild): Secondary | ICD-10-CM | POA: Diagnosis not present

## 2019-10-28 DIAGNOSIS — I129 Hypertensive chronic kidney disease with stage 1 through stage 4 chronic kidney disease, or unspecified chronic kidney disease: Secondary | ICD-10-CM | POA: Diagnosis not present

## 2019-10-28 DIAGNOSIS — E1122 Type 2 diabetes mellitus with diabetic chronic kidney disease: Secondary | ICD-10-CM | POA: Diagnosis not present

## 2019-10-28 DIAGNOSIS — H547 Unspecified visual loss: Secondary | ICD-10-CM | POA: Diagnosis not present

## 2019-10-28 DIAGNOSIS — M170 Bilateral primary osteoarthritis of knee: Secondary | ICD-10-CM | POA: Diagnosis not present

## 2019-10-28 DIAGNOSIS — G309 Alzheimer's disease, unspecified: Secondary | ICD-10-CM | POA: Diagnosis not present

## 2019-10-28 DIAGNOSIS — G3101 Pick's disease: Secondary | ICD-10-CM | POA: Diagnosis not present

## 2019-10-28 DIAGNOSIS — E785 Hyperlipidemia, unspecified: Secondary | ICD-10-CM | POA: Diagnosis not present

## 2019-10-28 DIAGNOSIS — R69 Illness, unspecified: Secondary | ICD-10-CM | POA: Diagnosis not present

## 2019-10-28 DIAGNOSIS — K222 Esophageal obstruction: Secondary | ICD-10-CM | POA: Diagnosis not present

## 2019-10-31 DIAGNOSIS — G3101 Pick's disease: Secondary | ICD-10-CM | POA: Diagnosis not present

## 2019-10-31 DIAGNOSIS — N182 Chronic kidney disease, stage 2 (mild): Secondary | ICD-10-CM | POA: Diagnosis not present

## 2019-10-31 DIAGNOSIS — I129 Hypertensive chronic kidney disease with stage 1 through stage 4 chronic kidney disease, or unspecified chronic kidney disease: Secondary | ICD-10-CM | POA: Diagnosis not present

## 2019-10-31 DIAGNOSIS — E785 Hyperlipidemia, unspecified: Secondary | ICD-10-CM | POA: Diagnosis not present

## 2019-10-31 DIAGNOSIS — G309 Alzheimer's disease, unspecified: Secondary | ICD-10-CM | POA: Diagnosis not present

## 2019-10-31 DIAGNOSIS — M170 Bilateral primary osteoarthritis of knee: Secondary | ICD-10-CM | POA: Diagnosis not present

## 2019-10-31 DIAGNOSIS — R69 Illness, unspecified: Secondary | ICD-10-CM | POA: Diagnosis not present

## 2019-10-31 DIAGNOSIS — H547 Unspecified visual loss: Secondary | ICD-10-CM | POA: Diagnosis not present

## 2019-10-31 DIAGNOSIS — K222 Esophageal obstruction: Secondary | ICD-10-CM | POA: Diagnosis not present

## 2019-10-31 DIAGNOSIS — E1122 Type 2 diabetes mellitus with diabetic chronic kidney disease: Secondary | ICD-10-CM | POA: Diagnosis not present

## 2019-10-31 NOTE — Telephone Encounter (Signed)
Informed patient's wife (care giver) we received fax from Dr. Kandyce Rud office stating patient can hold aggrenox 5 days prior to his procedure and start a 81 mg aspirin. Patients wife verbalized understanding.

## 2019-11-01 DIAGNOSIS — H26061 Combined forms of infantile and juvenile cataract, right eye: Secondary | ICD-10-CM | POA: Diagnosis not present

## 2019-11-01 DIAGNOSIS — H402223 Chronic angle-closure glaucoma, left eye, severe stage: Secondary | ICD-10-CM | POA: Diagnosis not present

## 2019-11-01 DIAGNOSIS — Z961 Presence of intraocular lens: Secondary | ICD-10-CM | POA: Diagnosis not present

## 2019-11-01 DIAGNOSIS — H402211 Chronic angle-closure glaucoma, right eye, mild stage: Secondary | ICD-10-CM | POA: Diagnosis not present

## 2019-11-01 DIAGNOSIS — G3184 Mild cognitive impairment, so stated: Secondary | ICD-10-CM | POA: Diagnosis not present

## 2019-11-03 DIAGNOSIS — E1122 Type 2 diabetes mellitus with diabetic chronic kidney disease: Secondary | ICD-10-CM | POA: Diagnosis not present

## 2019-11-03 DIAGNOSIS — I129 Hypertensive chronic kidney disease with stage 1 through stage 4 chronic kidney disease, or unspecified chronic kidney disease: Secondary | ICD-10-CM | POA: Diagnosis not present

## 2019-11-03 DIAGNOSIS — M170 Bilateral primary osteoarthritis of knee: Secondary | ICD-10-CM | POA: Diagnosis not present

## 2019-11-03 DIAGNOSIS — H547 Unspecified visual loss: Secondary | ICD-10-CM | POA: Diagnosis not present

## 2019-11-03 DIAGNOSIS — K222 Esophageal obstruction: Secondary | ICD-10-CM | POA: Diagnosis not present

## 2019-11-03 DIAGNOSIS — E785 Hyperlipidemia, unspecified: Secondary | ICD-10-CM | POA: Diagnosis not present

## 2019-11-03 DIAGNOSIS — R69 Illness, unspecified: Secondary | ICD-10-CM | POA: Diagnosis not present

## 2019-11-03 DIAGNOSIS — G3101 Pick's disease: Secondary | ICD-10-CM | POA: Diagnosis not present

## 2019-11-03 DIAGNOSIS — G309 Alzheimer's disease, unspecified: Secondary | ICD-10-CM | POA: Diagnosis not present

## 2019-11-03 DIAGNOSIS — N182 Chronic kidney disease, stage 2 (mild): Secondary | ICD-10-CM | POA: Diagnosis not present

## 2019-11-07 DIAGNOSIS — G3101 Pick's disease: Secondary | ICD-10-CM | POA: Diagnosis not present

## 2019-11-07 DIAGNOSIS — E785 Hyperlipidemia, unspecified: Secondary | ICD-10-CM | POA: Diagnosis not present

## 2019-11-07 DIAGNOSIS — N182 Chronic kidney disease, stage 2 (mild): Secondary | ICD-10-CM | POA: Diagnosis not present

## 2019-11-07 DIAGNOSIS — R69 Illness, unspecified: Secondary | ICD-10-CM | POA: Diagnosis not present

## 2019-11-07 DIAGNOSIS — I129 Hypertensive chronic kidney disease with stage 1 through stage 4 chronic kidney disease, or unspecified chronic kidney disease: Secondary | ICD-10-CM | POA: Diagnosis not present

## 2019-11-07 DIAGNOSIS — H547 Unspecified visual loss: Secondary | ICD-10-CM | POA: Diagnosis not present

## 2019-11-07 DIAGNOSIS — M170 Bilateral primary osteoarthritis of knee: Secondary | ICD-10-CM | POA: Diagnosis not present

## 2019-11-07 DIAGNOSIS — K222 Esophageal obstruction: Secondary | ICD-10-CM | POA: Diagnosis not present

## 2019-11-07 DIAGNOSIS — E1122 Type 2 diabetes mellitus with diabetic chronic kidney disease: Secondary | ICD-10-CM | POA: Diagnosis not present

## 2019-11-07 DIAGNOSIS — G309 Alzheimer's disease, unspecified: Secondary | ICD-10-CM | POA: Diagnosis not present

## 2019-11-09 ENCOUNTER — Encounter: Payer: Medicare HMO | Admitting: Gastroenterology

## 2019-11-14 DIAGNOSIS — M170 Bilateral primary osteoarthritis of knee: Secondary | ICD-10-CM | POA: Diagnosis not present

## 2019-11-14 DIAGNOSIS — K222 Esophageal obstruction: Secondary | ICD-10-CM | POA: Diagnosis not present

## 2019-11-14 DIAGNOSIS — N182 Chronic kidney disease, stage 2 (mild): Secondary | ICD-10-CM | POA: Diagnosis not present

## 2019-11-14 DIAGNOSIS — E785 Hyperlipidemia, unspecified: Secondary | ICD-10-CM | POA: Diagnosis not present

## 2019-11-14 DIAGNOSIS — E1122 Type 2 diabetes mellitus with diabetic chronic kidney disease: Secondary | ICD-10-CM | POA: Diagnosis not present

## 2019-11-14 DIAGNOSIS — G3101 Pick's disease: Secondary | ICD-10-CM | POA: Diagnosis not present

## 2019-11-14 DIAGNOSIS — H547 Unspecified visual loss: Secondary | ICD-10-CM | POA: Diagnosis not present

## 2019-11-14 DIAGNOSIS — G309 Alzheimer's disease, unspecified: Secondary | ICD-10-CM | POA: Diagnosis not present

## 2019-11-14 DIAGNOSIS — R69 Illness, unspecified: Secondary | ICD-10-CM | POA: Diagnosis not present

## 2019-11-14 DIAGNOSIS — I129 Hypertensive chronic kidney disease with stage 1 through stage 4 chronic kidney disease, or unspecified chronic kidney disease: Secondary | ICD-10-CM | POA: Diagnosis not present

## 2019-11-15 ENCOUNTER — Other Ambulatory Visit: Payer: Self-pay

## 2019-11-15 ENCOUNTER — Encounter: Payer: Self-pay | Admitting: Gastroenterology

## 2019-11-15 ENCOUNTER — Ambulatory Visit (AMBULATORY_SURGERY_CENTER): Payer: Medicare HMO | Admitting: Gastroenterology

## 2019-11-15 VITALS — BP 110/57 | HR 78 | Temp 96.8°F | Resp 23 | Ht 70.0 in | Wt 174.0 lb

## 2019-11-15 DIAGNOSIS — K449 Diaphragmatic hernia without obstruction or gangrene: Secondary | ICD-10-CM

## 2019-11-15 DIAGNOSIS — R1310 Dysphagia, unspecified: Secondary | ICD-10-CM

## 2019-11-15 DIAGNOSIS — R1319 Other dysphagia: Secondary | ICD-10-CM

## 2019-11-15 DIAGNOSIS — K222 Esophageal obstruction: Secondary | ICD-10-CM | POA: Diagnosis not present

## 2019-11-15 DIAGNOSIS — K317 Polyp of stomach and duodenum: Secondary | ICD-10-CM

## 2019-11-15 DIAGNOSIS — R69 Illness, unspecified: Secondary | ICD-10-CM | POA: Diagnosis not present

## 2019-11-15 MED ORDER — PANTOPRAZOLE SODIUM 40 MG PO TBEC: 40.0000 mg | DELAYED_RELEASE_TABLET | Freq: Two times a day (BID) | ORAL | 6 refills | Status: DC

## 2019-11-15 MED ORDER — SODIUM CHLORIDE 0.9 % IV SOLN: 500.0000 mL | Freq: Once | INTRAVENOUS | Status: DC

## 2019-11-15 NOTE — Progress Notes (Signed)
JB- front desk-temp  Dt- vs

## 2019-11-15 NOTE — Progress Notes (Signed)
Called to room to assist during endoscopic procedure.  Patient ID and intended procedure confirmed with present staff. Received instructions for my participation in the procedure from the performing physician.  

## 2019-11-15 NOTE — Progress Notes (Signed)
Report given to PACU, vss 

## 2019-11-15 NOTE — Patient Instructions (Signed)
Please read handouts provided. Follow Dilation Diet today, regular diet tomorrow. Antireflux measures long term. Continue present medications. Change Protonix ( pantoprazole ) to 40 mg twice daily. Resume dipyridamole at prior dose tomorrow. Return to GI office in 1 year.       YOU HAD AN ENDOSCOPIC PROCEDURE TODAY AT THE [facility]:   Refer to the procedure report that was given to you for any specific questions about what was found during the examination.  If the procedure report does not answer your questions, please call your gastroenterologist to clarify.  If you requested that your care partner not be given the details of your procedure findings, then the procedure report has been included in a sealed envelope for you to review at your convenience later.  YOU SHOULD EXPECT: Some feelings of bloating in the abdomen. Passage of more gas than usual.  Walking can help get rid of the air that was put into your GI tract during the procedure and reduce the bloating. If you had a lower endoscopy (such as a colonoscopy or flexible sigmoidoscopy) you may notice spotting of blood in your stool or on the toilet paper. If you underwent a bowel prep for your procedure, you may not have a normal bowel movement for a few days.  Please Note:  You might notice some irritation and congestion in your nose or some drainage.  This is from the oxygen used during your procedure.  There is no need for concern and it should clear up in a day or so.  SYMPTOMS TO REPORT IMMEDIATELY:     Following upper endoscopy (EGD)  Vomiting of blood or coffee ground material  New chest pain or pain under the shoulder blades  Painful or persistently difficult swallowing  New shortness of breath  Fever of 100F or higher  Black, tarry-looking stools  For urgent or emergent issues, a gastroenterologist can be reached at any hour by calling [phone].   DIET:  Drink plenty of fluids but you should  avoid alcoholic beverages for 24 hours.  ACTIVITY:  You should plan to take it easy for the rest of today and you should NOT DRIVE or use heavy machinery until tomorrow (because of the sedation medicines used during the test).    FOLLOW UP: Our staff will call the number listed on your records 48-72 hours following your procedure to check on you and address any questions or concerns that you may have regarding the information given to you following your procedure. If we do not reach you, we will leave a message.  We will attempt to reach you two times.  During this call, we will ask if you have developed any symptoms of COVID 19. If you develop any symptoms (ie: fever, flu-like symptoms, shortness of breath, cough etc.) before then, please call [phone].  If you test positive for Covid 19 in the 2 weeks post procedure, please call and report this information to [place].    If any biopsies were taken you will be contacted by phone or by letter within the next 1-3 weeks.  Please call us at [phone] if you have not heard about the biopsies in 3 weeks.    SIGNATURES/CONFIDENTIALITY: You and/or your care partner have signed paperwork which will be entered into your electronic medical record.  These signatures attest to the fact that that the information above on your After Visit Summary has been reviewed and is understood.  Full responsibility of the confidentiality of this  discharge information lies with you and/or your care-partner. 

## 2019-11-15 NOTE — Op Note (Signed)
[facility] Patient Name: Daniel Morrow Procedure Date: 11/15/2019 1:35 PM MRN: [id number] Endoscopist: Meryl Dare , MD Age: 79 Referring MD:  Date of Birth: 02/01/1941 Gender: Male Account #: [id number] Procedure:                Upper GI endoscopy Indications:              Dysphagia, Esophageal stricture Medicines:                Monitored Anesthesia Care Procedure:                Pre-Anesthesia Assessment:                           - Prior to the procedure, a History and Physical                            was performed, and patient medications and                            allergies were reviewed. The patient's tolerance of                            previous anesthesia was also reviewed. The risks                            and benefits of the procedure and the sedation                            options and risks were discussed with the patient.                            All questions were answered, and informed consent                            was obtained. Prior Anticoagulants: The patient has                            taken dipyridamole, last dose was 5 days prior to                            procedure. ASA Grade Assessment: III - A patient                            with severe systemic disease. After reviewing the                            risks and benefits, the patient was deemed in                            satisfactory condition to undergo the procedure.                           After obtaining informed consent, the endoscope was  passed under direct vision. Throughout the                            procedure, the patient's blood pressure, pulse, and                            oxygen saturations were monitored continuously. The                            Endoscope was introduced through the mouth, and                            advanced to the second part of duodenum. The upper                            GI endoscopy was  accomplished without difficulty.                            The patient tolerated the procedure well. Scope In: Scope Out: Findings:                 One benign-appearing, intrinsic moderate stenosis                            was found 41 cm from the incisors. This stenosis                            measured 1.2 cm (inner diameter) x less than one cm                            (in length). The stenosis was traversed. A                            guidewire was placed and the scope was withdrawn.                            Dilations were performed with Savary dilators with                            mild resistance at 13 mm, 14 mm and 15 mm. No heme                            noted.                           The exam of the esophagus was otherwise normal.                           A small hiatal hernia was present.                           A few small sessile polyps with no bleeding and no  stigmata of recent bleeding were found in the                            gastric fundus and in the gastric body. Typical                            appearance for benign fundic gland polyps, not                            biopsied. Small amount of residual food in stomach.                           The exam of the stomach was otherwise normal.                           The duodenal bulb and second portion of the                            duodenum were normal. Complications:            No immediate complications. Estimated Blood Loss:     Estimated blood loss: none. Impression:               - Benign-appearing esophageal stenosis. Dilated.                           - Small hiatal hernia.                           - A few gastric polyps.                           - Normal duodenal bulb and second portion of the                            duodenum.                           - No specimens collected. Recommendation:           - Patient has a contact number available for                             emergencies. The signs and symptoms of potential                            delayed complications were discussed with the                            patient. Return to normal activities tomorrow.                            Written discharge instructions were provided to the                            patient.                           -  Clear liquid diet for 2 hours, then advance as                            tolerated to soft diet today.                           - Resume prior diet tomorrow.                           - Antireflux measures long term.                           - Continue present medications.                           - Change Protonix (pantoprazole) to 40 mg PO BID, 1                            year of refills.                           - Resume dipyridamole at prior dose tomorrow. Refer                            to managing physician for further adjustment of                            therapy.                           - Return to GI office in 1 year. Meryl Dare, MD 11/15/2019 1:55:47 PM This report has been signed electronically.

## 2019-11-17 ENCOUNTER — Telehealth: Payer: Self-pay

## 2019-11-17 NOTE — Telephone Encounter (Signed)
  Follow up Call-  Call back number 11/15/2019 01/27/2019  Post procedure Call Back phone  # [phone] - wife -Daniel Morrow- pt doesnt speak much -dementia [phone]  speak to wife Daniel Morrow)  Permission to leave phone message Yes Yes  Some recent data might be hidden     Patient questions:  Do you have a fever, pain , or abdominal swelling? No. Pain Score  0 *  Have you tolerated food without any problems? Yes.    Have you been able to return to your normal activities? Yes.    Do you have any questions about your discharge instructions: Diet   No. Medications  No. Follow up visit  No.  Do you have questions or concerns about your Care? No.  Actions: * If pain score is 4 or above: No action needed, pain <4.   1. Have you developed a fever since your procedure? No  2.   Have you had an respiratory symptoms (SOB or cough) since your procedure? No  3.   Have you tested positive for COVID 19 since your procedure No  4.   Have you had any family members/close contacts diagnosed with the COVID 19 since your procedure?  No   If yes to any of these questions please route to Laverna Peace, RN and Jennye Boroughs, RN.

## 2019-11-21 DIAGNOSIS — K222 Esophageal obstruction: Secondary | ICD-10-CM | POA: Diagnosis not present

## 2019-11-21 DIAGNOSIS — N182 Chronic kidney disease, stage 2 (mild): Secondary | ICD-10-CM | POA: Diagnosis not present

## 2019-11-21 DIAGNOSIS — G309 Alzheimer's disease, unspecified: Secondary | ICD-10-CM | POA: Diagnosis not present

## 2019-11-21 DIAGNOSIS — M170 Bilateral primary osteoarthritis of knee: Secondary | ICD-10-CM | POA: Diagnosis not present

## 2019-11-21 DIAGNOSIS — G3101 Pick's disease: Secondary | ICD-10-CM | POA: Diagnosis not present

## 2019-11-21 DIAGNOSIS — I129 Hypertensive chronic kidney disease with stage 1 through stage 4 chronic kidney disease, or unspecified chronic kidney disease: Secondary | ICD-10-CM | POA: Diagnosis not present

## 2019-11-21 DIAGNOSIS — E1122 Type 2 diabetes mellitus with diabetic chronic kidney disease: Secondary | ICD-10-CM | POA: Diagnosis not present

## 2019-11-21 DIAGNOSIS — R69 Illness, unspecified: Secondary | ICD-10-CM | POA: Diagnosis not present

## 2019-11-21 DIAGNOSIS — E785 Hyperlipidemia, unspecified: Secondary | ICD-10-CM | POA: Diagnosis not present

## 2019-11-21 DIAGNOSIS — H547 Unspecified visual loss: Secondary | ICD-10-CM | POA: Diagnosis not present

## 2019-11-23 DIAGNOSIS — H547 Unspecified visual loss: Secondary | ICD-10-CM | POA: Diagnosis not present

## 2019-11-23 DIAGNOSIS — M170 Bilateral primary osteoarthritis of knee: Secondary | ICD-10-CM | POA: Diagnosis not present

## 2019-11-23 DIAGNOSIS — G309 Alzheimer's disease, unspecified: Secondary | ICD-10-CM | POA: Diagnosis not present

## 2019-11-23 DIAGNOSIS — N182 Chronic kidney disease, stage 2 (mild): Secondary | ICD-10-CM | POA: Diagnosis not present

## 2019-11-23 DIAGNOSIS — I129 Hypertensive chronic kidney disease with stage 1 through stage 4 chronic kidney disease, or unspecified chronic kidney disease: Secondary | ICD-10-CM | POA: Diagnosis not present

## 2019-11-23 DIAGNOSIS — E1122 Type 2 diabetes mellitus with diabetic chronic kidney disease: Secondary | ICD-10-CM | POA: Diagnosis not present

## 2019-11-23 DIAGNOSIS — R69 Illness, unspecified: Secondary | ICD-10-CM | POA: Diagnosis not present

## 2019-11-23 DIAGNOSIS — G3101 Pick's disease: Secondary | ICD-10-CM | POA: Diagnosis not present

## 2019-11-23 DIAGNOSIS — E785 Hyperlipidemia, unspecified: Secondary | ICD-10-CM | POA: Diagnosis not present

## 2019-11-23 DIAGNOSIS — K222 Esophageal obstruction: Secondary | ICD-10-CM | POA: Diagnosis not present

## 2019-11-29 DIAGNOSIS — G3101 Pick's disease: Secondary | ICD-10-CM | POA: Diagnosis not present

## 2019-11-29 DIAGNOSIS — K222 Esophageal obstruction: Secondary | ICD-10-CM | POA: Diagnosis not present

## 2019-11-29 DIAGNOSIS — H547 Unspecified visual loss: Secondary | ICD-10-CM | POA: Diagnosis not present

## 2019-11-29 DIAGNOSIS — E1122 Type 2 diabetes mellitus with diabetic chronic kidney disease: Secondary | ICD-10-CM | POA: Diagnosis not present

## 2019-11-29 DIAGNOSIS — M170 Bilateral primary osteoarthritis of knee: Secondary | ICD-10-CM | POA: Diagnosis not present

## 2019-11-29 DIAGNOSIS — I129 Hypertensive chronic kidney disease with stage 1 through stage 4 chronic kidney disease, or unspecified chronic kidney disease: Secondary | ICD-10-CM | POA: Diagnosis not present

## 2019-11-29 DIAGNOSIS — E785 Hyperlipidemia, unspecified: Secondary | ICD-10-CM | POA: Diagnosis not present

## 2019-11-29 DIAGNOSIS — N182 Chronic kidney disease, stage 2 (mild): Secondary | ICD-10-CM | POA: Diagnosis not present

## 2019-11-29 DIAGNOSIS — R69 Illness, unspecified: Secondary | ICD-10-CM | POA: Diagnosis not present

## 2019-11-29 DIAGNOSIS — G309 Alzheimer's disease, unspecified: Secondary | ICD-10-CM | POA: Diagnosis not present

## 2019-12-05 DIAGNOSIS — E785 Hyperlipidemia, unspecified: Secondary | ICD-10-CM | POA: Diagnosis not present

## 2019-12-05 DIAGNOSIS — M170 Bilateral primary osteoarthritis of knee: Secondary | ICD-10-CM | POA: Diagnosis not present

## 2019-12-05 DIAGNOSIS — E1122 Type 2 diabetes mellitus with diabetic chronic kidney disease: Secondary | ICD-10-CM | POA: Diagnosis not present

## 2019-12-05 DIAGNOSIS — H547 Unspecified visual loss: Secondary | ICD-10-CM | POA: Diagnosis not present

## 2019-12-05 DIAGNOSIS — I129 Hypertensive chronic kidney disease with stage 1 through stage 4 chronic kidney disease, or unspecified chronic kidney disease: Secondary | ICD-10-CM | POA: Diagnosis not present

## 2019-12-05 DIAGNOSIS — K222 Esophageal obstruction: Secondary | ICD-10-CM | POA: Diagnosis not present

## 2019-12-05 DIAGNOSIS — G309 Alzheimer's disease, unspecified: Secondary | ICD-10-CM | POA: Diagnosis not present

## 2019-12-05 DIAGNOSIS — R69 Illness, unspecified: Secondary | ICD-10-CM | POA: Diagnosis not present

## 2019-12-05 DIAGNOSIS — G3101 Pick's disease: Secondary | ICD-10-CM | POA: Diagnosis not present

## 2019-12-05 DIAGNOSIS — N182 Chronic kidney disease, stage 2 (mild): Secondary | ICD-10-CM | POA: Diagnosis not present

## 2019-12-13 DIAGNOSIS — K222 Esophageal obstruction: Secondary | ICD-10-CM | POA: Diagnosis not present

## 2019-12-13 DIAGNOSIS — E1122 Type 2 diabetes mellitus with diabetic chronic kidney disease: Secondary | ICD-10-CM | POA: Diagnosis not present

## 2019-12-13 DIAGNOSIS — G3101 Pick's disease: Secondary | ICD-10-CM | POA: Diagnosis not present

## 2019-12-13 DIAGNOSIS — N182 Chronic kidney disease, stage 2 (mild): Secondary | ICD-10-CM | POA: Diagnosis not present

## 2019-12-13 DIAGNOSIS — G309 Alzheimer's disease, unspecified: Secondary | ICD-10-CM | POA: Diagnosis not present

## 2019-12-13 DIAGNOSIS — E785 Hyperlipidemia, unspecified: Secondary | ICD-10-CM | POA: Diagnosis not present

## 2019-12-13 DIAGNOSIS — H547 Unspecified visual loss: Secondary | ICD-10-CM | POA: Diagnosis not present

## 2019-12-13 DIAGNOSIS — M170 Bilateral primary osteoarthritis of knee: Secondary | ICD-10-CM | POA: Diagnosis not present

## 2019-12-13 DIAGNOSIS — R69 Illness, unspecified: Secondary | ICD-10-CM | POA: Diagnosis not present

## 2019-12-13 DIAGNOSIS — I129 Hypertensive chronic kidney disease with stage 1 through stage 4 chronic kidney disease, or unspecified chronic kidney disease: Secondary | ICD-10-CM | POA: Diagnosis not present

## 2019-12-21 DIAGNOSIS — I129 Hypertensive chronic kidney disease with stage 1 through stage 4 chronic kidney disease, or unspecified chronic kidney disease: Secondary | ICD-10-CM | POA: Diagnosis not present

## 2019-12-21 DIAGNOSIS — N182 Chronic kidney disease, stage 2 (mild): Secondary | ICD-10-CM | POA: Diagnosis not present

## 2019-12-21 DIAGNOSIS — H547 Unspecified visual loss: Secondary | ICD-10-CM | POA: Diagnosis not present

## 2019-12-21 DIAGNOSIS — G309 Alzheimer's disease, unspecified: Secondary | ICD-10-CM | POA: Diagnosis not present

## 2019-12-21 DIAGNOSIS — H409 Unspecified glaucoma: Secondary | ICD-10-CM | POA: Diagnosis not present

## 2019-12-21 DIAGNOSIS — G3101 Pick's disease: Secondary | ICD-10-CM | POA: Diagnosis not present

## 2019-12-21 DIAGNOSIS — K222 Esophageal obstruction: Secondary | ICD-10-CM | POA: Diagnosis not present

## 2019-12-21 DIAGNOSIS — M170 Bilateral primary osteoarthritis of knee: Secondary | ICD-10-CM | POA: Diagnosis not present

## 2019-12-21 DIAGNOSIS — E1122 Type 2 diabetes mellitus with diabetic chronic kidney disease: Secondary | ICD-10-CM | POA: Diagnosis not present

## 2019-12-21 DIAGNOSIS — R69 Illness, unspecified: Secondary | ICD-10-CM | POA: Diagnosis not present

## 2019-12-28 DIAGNOSIS — G309 Alzheimer's disease, unspecified: Secondary | ICD-10-CM | POA: Diagnosis not present

## 2019-12-28 DIAGNOSIS — M170 Bilateral primary osteoarthritis of knee: Secondary | ICD-10-CM | POA: Diagnosis not present

## 2019-12-28 DIAGNOSIS — N182 Chronic kidney disease, stage 2 (mild): Secondary | ICD-10-CM | POA: Diagnosis not present

## 2019-12-28 DIAGNOSIS — G3101 Pick's disease: Secondary | ICD-10-CM | POA: Diagnosis not present

## 2019-12-28 DIAGNOSIS — I129 Hypertensive chronic kidney disease with stage 1 through stage 4 chronic kidney disease, or unspecified chronic kidney disease: Secondary | ICD-10-CM | POA: Diagnosis not present

## 2019-12-28 DIAGNOSIS — K222 Esophageal obstruction: Secondary | ICD-10-CM | POA: Diagnosis not present

## 2019-12-28 DIAGNOSIS — E1122 Type 2 diabetes mellitus with diabetic chronic kidney disease: Secondary | ICD-10-CM | POA: Diagnosis not present

## 2019-12-28 DIAGNOSIS — H547 Unspecified visual loss: Secondary | ICD-10-CM | POA: Diagnosis not present

## 2019-12-28 DIAGNOSIS — H409 Unspecified glaucoma: Secondary | ICD-10-CM | POA: Diagnosis not present

## 2019-12-28 DIAGNOSIS — R69 Illness, unspecified: Secondary | ICD-10-CM | POA: Diagnosis not present

## 2020-01-02 DIAGNOSIS — R69 Illness, unspecified: Secondary | ICD-10-CM | POA: Diagnosis not present

## 2020-01-02 DIAGNOSIS — M170 Bilateral primary osteoarthritis of knee: Secondary | ICD-10-CM | POA: Diagnosis not present

## 2020-01-02 DIAGNOSIS — H547 Unspecified visual loss: Secondary | ICD-10-CM | POA: Diagnosis not present

## 2020-01-02 DIAGNOSIS — K222 Esophageal obstruction: Secondary | ICD-10-CM | POA: Diagnosis not present

## 2020-01-02 DIAGNOSIS — G309 Alzheimer's disease, unspecified: Secondary | ICD-10-CM | POA: Diagnosis not present

## 2020-01-02 DIAGNOSIS — E1122 Type 2 diabetes mellitus with diabetic chronic kidney disease: Secondary | ICD-10-CM | POA: Diagnosis not present

## 2020-01-02 DIAGNOSIS — I129 Hypertensive chronic kidney disease with stage 1 through stage 4 chronic kidney disease, or unspecified chronic kidney disease: Secondary | ICD-10-CM | POA: Diagnosis not present

## 2020-01-02 DIAGNOSIS — N182 Chronic kidney disease, stage 2 (mild): Secondary | ICD-10-CM | POA: Diagnosis not present

## 2020-01-02 DIAGNOSIS — H409 Unspecified glaucoma: Secondary | ICD-10-CM | POA: Diagnosis not present

## 2020-01-02 DIAGNOSIS — G3101 Pick's disease: Secondary | ICD-10-CM | POA: Diagnosis not present

## 2020-01-09 DIAGNOSIS — E1122 Type 2 diabetes mellitus with diabetic chronic kidney disease: Secondary | ICD-10-CM | POA: Diagnosis not present

## 2020-01-09 DIAGNOSIS — H547 Unspecified visual loss: Secondary | ICD-10-CM | POA: Diagnosis not present

## 2020-01-09 DIAGNOSIS — N182 Chronic kidney disease, stage 2 (mild): Secondary | ICD-10-CM | POA: Diagnosis not present

## 2020-01-09 DIAGNOSIS — K222 Esophageal obstruction: Secondary | ICD-10-CM | POA: Diagnosis not present

## 2020-01-09 DIAGNOSIS — M170 Bilateral primary osteoarthritis of knee: Secondary | ICD-10-CM | POA: Diagnosis not present

## 2020-01-09 DIAGNOSIS — R69 Illness, unspecified: Secondary | ICD-10-CM | POA: Diagnosis not present

## 2020-01-09 DIAGNOSIS — I129 Hypertensive chronic kidney disease with stage 1 through stage 4 chronic kidney disease, or unspecified chronic kidney disease: Secondary | ICD-10-CM | POA: Diagnosis not present

## 2020-01-09 DIAGNOSIS — H409 Unspecified glaucoma: Secondary | ICD-10-CM | POA: Diagnosis not present

## 2020-01-09 DIAGNOSIS — G3101 Pick's disease: Secondary | ICD-10-CM | POA: Diagnosis not present

## 2020-01-09 DIAGNOSIS — G309 Alzheimer's disease, unspecified: Secondary | ICD-10-CM | POA: Diagnosis not present

## 2020-01-10 DIAGNOSIS — G3101 Pick's disease: Secondary | ICD-10-CM | POA: Diagnosis not present

## 2020-01-10 DIAGNOSIS — E785 Hyperlipidemia, unspecified: Secondary | ICD-10-CM | POA: Diagnosis not present

## 2020-01-10 DIAGNOSIS — G3109 Other frontotemporal dementia: Secondary | ICD-10-CM | POA: Diagnosis not present

## 2020-01-10 DIAGNOSIS — I10 Essential (primary) hypertension: Secondary | ICD-10-CM | POA: Diagnosis not present

## 2020-01-10 DIAGNOSIS — E1122 Type 2 diabetes mellitus with diabetic chronic kidney disease: Secondary | ICD-10-CM | POA: Diagnosis not present

## 2020-01-10 DIAGNOSIS — R69 Illness, unspecified: Secondary | ICD-10-CM | POA: Diagnosis not present

## 2020-01-10 DIAGNOSIS — E1142 Type 2 diabetes mellitus with diabetic polyneuropathy: Secondary | ICD-10-CM | POA: Diagnosis not present

## 2020-01-10 DIAGNOSIS — E1165 Type 2 diabetes mellitus with hyperglycemia: Secondary | ICD-10-CM | POA: Diagnosis not present

## 2020-01-18 DIAGNOSIS — I129 Hypertensive chronic kidney disease with stage 1 through stage 4 chronic kidney disease, or unspecified chronic kidney disease: Secondary | ICD-10-CM | POA: Diagnosis not present

## 2020-01-18 DIAGNOSIS — K222 Esophageal obstruction: Secondary | ICD-10-CM | POA: Diagnosis not present

## 2020-01-18 DIAGNOSIS — G3101 Pick's disease: Secondary | ICD-10-CM | POA: Diagnosis not present

## 2020-01-18 DIAGNOSIS — E1122 Type 2 diabetes mellitus with diabetic chronic kidney disease: Secondary | ICD-10-CM | POA: Diagnosis not present

## 2020-01-18 DIAGNOSIS — H547 Unspecified visual loss: Secondary | ICD-10-CM | POA: Diagnosis not present

## 2020-01-18 DIAGNOSIS — M170 Bilateral primary osteoarthritis of knee: Secondary | ICD-10-CM | POA: Diagnosis not present

## 2020-01-18 DIAGNOSIS — H409 Unspecified glaucoma: Secondary | ICD-10-CM | POA: Diagnosis not present

## 2020-01-18 DIAGNOSIS — N182 Chronic kidney disease, stage 2 (mild): Secondary | ICD-10-CM | POA: Diagnosis not present

## 2020-01-18 DIAGNOSIS — G309 Alzheimer's disease, unspecified: Secondary | ICD-10-CM | POA: Diagnosis not present

## 2020-01-18 DIAGNOSIS — R69 Illness, unspecified: Secondary | ICD-10-CM | POA: Diagnosis not present

## 2020-01-25 DIAGNOSIS — H547 Unspecified visual loss: Secondary | ICD-10-CM | POA: Diagnosis not present

## 2020-01-25 DIAGNOSIS — N182 Chronic kidney disease, stage 2 (mild): Secondary | ICD-10-CM | POA: Diagnosis not present

## 2020-01-25 DIAGNOSIS — H409 Unspecified glaucoma: Secondary | ICD-10-CM | POA: Diagnosis not present

## 2020-01-25 DIAGNOSIS — K222 Esophageal obstruction: Secondary | ICD-10-CM | POA: Diagnosis not present

## 2020-01-25 DIAGNOSIS — R69 Illness, unspecified: Secondary | ICD-10-CM | POA: Diagnosis not present

## 2020-01-25 DIAGNOSIS — G3101 Pick's disease: Secondary | ICD-10-CM | POA: Diagnosis not present

## 2020-01-25 DIAGNOSIS — E1122 Type 2 diabetes mellitus with diabetic chronic kidney disease: Secondary | ICD-10-CM | POA: Diagnosis not present

## 2020-01-25 DIAGNOSIS — G309 Alzheimer's disease, unspecified: Secondary | ICD-10-CM | POA: Diagnosis not present

## 2020-01-25 DIAGNOSIS — M170 Bilateral primary osteoarthritis of knee: Secondary | ICD-10-CM | POA: Diagnosis not present

## 2020-01-25 DIAGNOSIS — I129 Hypertensive chronic kidney disease with stage 1 through stage 4 chronic kidney disease, or unspecified chronic kidney disease: Secondary | ICD-10-CM | POA: Diagnosis not present

## 2020-01-26 DIAGNOSIS — K219 Gastro-esophageal reflux disease without esophagitis: Secondary | ICD-10-CM | POA: Diagnosis not present

## 2020-01-26 DIAGNOSIS — G3101 Pick's disease: Secondary | ICD-10-CM | POA: Diagnosis not present

## 2020-01-26 DIAGNOSIS — Z7189 Other specified counseling: Secondary | ICD-10-CM | POA: Diagnosis not present

## 2020-01-26 DIAGNOSIS — I10 Essential (primary) hypertension: Secondary | ICD-10-CM | POA: Diagnosis not present

## 2020-01-26 DIAGNOSIS — E1122 Type 2 diabetes mellitus with diabetic chronic kidney disease: Secondary | ICD-10-CM | POA: Diagnosis not present

## 2020-01-26 DIAGNOSIS — E1142 Type 2 diabetes mellitus with diabetic polyneuropathy: Secondary | ICD-10-CM | POA: Diagnosis not present

## 2020-01-26 DIAGNOSIS — Z23 Encounter for immunization: Secondary | ICD-10-CM | POA: Diagnosis not present

## 2020-02-01 DIAGNOSIS — R69 Illness, unspecified: Secondary | ICD-10-CM | POA: Diagnosis not present

## 2020-02-01 DIAGNOSIS — K222 Esophageal obstruction: Secondary | ICD-10-CM | POA: Diagnosis not present

## 2020-02-01 DIAGNOSIS — G309 Alzheimer's disease, unspecified: Secondary | ICD-10-CM | POA: Diagnosis not present

## 2020-02-01 DIAGNOSIS — I129 Hypertensive chronic kidney disease with stage 1 through stage 4 chronic kidney disease, or unspecified chronic kidney disease: Secondary | ICD-10-CM | POA: Diagnosis not present

## 2020-02-01 DIAGNOSIS — H547 Unspecified visual loss: Secondary | ICD-10-CM | POA: Diagnosis not present

## 2020-02-01 DIAGNOSIS — E1122 Type 2 diabetes mellitus with diabetic chronic kidney disease: Secondary | ICD-10-CM | POA: Diagnosis not present

## 2020-02-01 DIAGNOSIS — M170 Bilateral primary osteoarthritis of knee: Secondary | ICD-10-CM | POA: Diagnosis not present

## 2020-02-01 DIAGNOSIS — H409 Unspecified glaucoma: Secondary | ICD-10-CM | POA: Diagnosis not present

## 2020-02-01 DIAGNOSIS — N182 Chronic kidney disease, stage 2 (mild): Secondary | ICD-10-CM | POA: Diagnosis not present

## 2020-02-01 DIAGNOSIS — G3101 Pick's disease: Secondary | ICD-10-CM | POA: Diagnosis not present

## 2020-02-08 DIAGNOSIS — M170 Bilateral primary osteoarthritis of knee: Secondary | ICD-10-CM | POA: Diagnosis not present

## 2020-02-08 DIAGNOSIS — K222 Esophageal obstruction: Secondary | ICD-10-CM | POA: Diagnosis not present

## 2020-02-08 DIAGNOSIS — H547 Unspecified visual loss: Secondary | ICD-10-CM | POA: Diagnosis not present

## 2020-02-08 DIAGNOSIS — G3101 Pick's disease: Secondary | ICD-10-CM | POA: Diagnosis not present

## 2020-02-08 DIAGNOSIS — G309 Alzheimer's disease, unspecified: Secondary | ICD-10-CM | POA: Diagnosis not present

## 2020-02-08 DIAGNOSIS — R69 Illness, unspecified: Secondary | ICD-10-CM | POA: Diagnosis not present

## 2020-02-08 DIAGNOSIS — I129 Hypertensive chronic kidney disease with stage 1 through stage 4 chronic kidney disease, or unspecified chronic kidney disease: Secondary | ICD-10-CM | POA: Diagnosis not present

## 2020-02-08 DIAGNOSIS — H409 Unspecified glaucoma: Secondary | ICD-10-CM | POA: Diagnosis not present

## 2020-02-08 DIAGNOSIS — N182 Chronic kidney disease, stage 2 (mild): Secondary | ICD-10-CM | POA: Diagnosis not present

## 2020-02-08 DIAGNOSIS — E1122 Type 2 diabetes mellitus with diabetic chronic kidney disease: Secondary | ICD-10-CM | POA: Diagnosis not present

## 2020-03-05 DIAGNOSIS — H26061 Combined forms of infantile and juvenile cataract, right eye: Secondary | ICD-10-CM | POA: Diagnosis not present

## 2020-03-05 DIAGNOSIS — Z961 Presence of intraocular lens: Secondary | ICD-10-CM | POA: Diagnosis not present

## 2020-03-05 DIAGNOSIS — H402223 Chronic angle-closure glaucoma, left eye, severe stage: Secondary | ICD-10-CM | POA: Diagnosis not present

## 2020-03-05 DIAGNOSIS — H402211 Chronic angle-closure glaucoma, right eye, mild stage: Secondary | ICD-10-CM | POA: Diagnosis not present

## 2020-03-05 DIAGNOSIS — G3184 Mild cognitive impairment, so stated: Secondary | ICD-10-CM | POA: Diagnosis not present

## 2020-03-20 DIAGNOSIS — R69 Illness, unspecified: Secondary | ICD-10-CM | POA: Diagnosis not present

## 2020-03-20 DIAGNOSIS — E1165 Type 2 diabetes mellitus with hyperglycemia: Secondary | ICD-10-CM | POA: Diagnosis not present

## 2020-03-20 DIAGNOSIS — G3109 Other frontotemporal dementia: Secondary | ICD-10-CM | POA: Diagnosis not present

## 2020-03-20 DIAGNOSIS — E1142 Type 2 diabetes mellitus with diabetic polyneuropathy: Secondary | ICD-10-CM | POA: Diagnosis not present

## 2020-03-20 DIAGNOSIS — I10 Essential (primary) hypertension: Secondary | ICD-10-CM | POA: Diagnosis not present

## 2020-03-20 DIAGNOSIS — E785 Hyperlipidemia, unspecified: Secondary | ICD-10-CM | POA: Diagnosis not present

## 2020-03-20 DIAGNOSIS — E1122 Type 2 diabetes mellitus with diabetic chronic kidney disease: Secondary | ICD-10-CM | POA: Diagnosis not present

## 2020-03-20 DIAGNOSIS — E7800 Pure hypercholesterolemia, unspecified: Secondary | ICD-10-CM | POA: Diagnosis not present

## 2020-03-20 DIAGNOSIS — N401 Enlarged prostate with lower urinary tract symptoms: Secondary | ICD-10-CM | POA: Diagnosis not present

## 2020-03-29 DIAGNOSIS — E785 Hyperlipidemia, unspecified: Secondary | ICD-10-CM | POA: Diagnosis not present

## 2020-03-29 DIAGNOSIS — N401 Enlarged prostate with lower urinary tract symptoms: Secondary | ICD-10-CM | POA: Diagnosis not present

## 2020-03-29 DIAGNOSIS — G3109 Other frontotemporal dementia: Secondary | ICD-10-CM | POA: Diagnosis not present

## 2020-03-29 DIAGNOSIS — E1165 Type 2 diabetes mellitus with hyperglycemia: Secondary | ICD-10-CM | POA: Diagnosis not present

## 2020-03-29 DIAGNOSIS — R69 Illness, unspecified: Secondary | ICD-10-CM | POA: Diagnosis not present

## 2020-03-29 DIAGNOSIS — I10 Essential (primary) hypertension: Secondary | ICD-10-CM | POA: Diagnosis not present

## 2020-03-29 DIAGNOSIS — E1122 Type 2 diabetes mellitus with diabetic chronic kidney disease: Secondary | ICD-10-CM | POA: Diagnosis not present

## 2020-03-29 DIAGNOSIS — E7800 Pure hypercholesterolemia, unspecified: Secondary | ICD-10-CM | POA: Diagnosis not present

## 2020-04-03 ENCOUNTER — Encounter (HOSPITAL_BASED_OUTPATIENT_CLINIC_OR_DEPARTMENT_OTHER): Payer: Self-pay

## 2020-04-03 ENCOUNTER — Emergency Department (HOSPITAL_BASED_OUTPATIENT_CLINIC_OR_DEPARTMENT_OTHER): Payer: Medicare HMO

## 2020-04-03 ENCOUNTER — Emergency Department (HOSPITAL_BASED_OUTPATIENT_CLINIC_OR_DEPARTMENT_OTHER)
Admission: EM | Admit: 2020-04-03 | Discharge: 2020-04-03 | Disposition: A | Discharge status: Home / Self Care | Payer: Medicare HMO | Attending: Emergency Medicine | Admitting: Emergency Medicine

## 2020-04-03 ENCOUNTER — Other Ambulatory Visit: Payer: Self-pay

## 2020-04-03 DIAGNOSIS — W010XXA Fall on same level from slipping, tripping and stumbling without subsequent striking against object, initial encounter: Secondary | ICD-10-CM | POA: Diagnosis not present

## 2020-04-03 DIAGNOSIS — E119 Type 2 diabetes mellitus without complications: Secondary | ICD-10-CM | POA: Diagnosis not present

## 2020-04-03 DIAGNOSIS — Y999 Unspecified external cause status: Secondary | ICD-10-CM | POA: Diagnosis not present

## 2020-04-03 DIAGNOSIS — I10 Essential (primary) hypertension: Secondary | ICD-10-CM | POA: Diagnosis not present

## 2020-04-03 DIAGNOSIS — Y939 Activity, unspecified: Secondary | ICD-10-CM | POA: Diagnosis not present

## 2020-04-03 DIAGNOSIS — F0390 Unspecified dementia without behavioral disturbance: Secondary | ICD-10-CM | POA: Insufficient documentation

## 2020-04-03 DIAGNOSIS — S199XXA Unspecified injury of neck, initial encounter: Secondary | ICD-10-CM | POA: Diagnosis not present

## 2020-04-03 DIAGNOSIS — S0180XA Unspecified open wound of other part of head, initial encounter: Secondary | ICD-10-CM | POA: Diagnosis not present

## 2020-04-03 DIAGNOSIS — Z79899 Other long term (current) drug therapy: Secondary | ICD-10-CM | POA: Insufficient documentation

## 2020-04-03 DIAGNOSIS — Y929 Unspecified place or not applicable: Secondary | ICD-10-CM | POA: Diagnosis not present

## 2020-04-03 DIAGNOSIS — Z7984 Long term (current) use of oral hypoglycemic drugs: Secondary | ICD-10-CM | POA: Insufficient documentation

## 2020-04-03 DIAGNOSIS — S0993XA Unspecified injury of face, initial encounter: Secondary | ICD-10-CM | POA: Diagnosis present

## 2020-04-03 DIAGNOSIS — Z8673 Personal history of transient ischemic attack (TIA), and cerebral infarction without residual deficits: Secondary | ICD-10-CM | POA: Insufficient documentation

## 2020-04-03 DIAGNOSIS — R69 Illness, unspecified: Secondary | ICD-10-CM | POA: Diagnosis not present

## 2020-04-03 DIAGNOSIS — S0591XA Unspecified injury of right eye and orbit, initial encounter: Secondary | ICD-10-CM | POA: Diagnosis not present

## 2020-04-03 DIAGNOSIS — W19XXXA Unspecified fall, initial encounter: Secondary | ICD-10-CM

## 2020-04-03 DIAGNOSIS — Z87891 Personal history of nicotine dependence: Secondary | ICD-10-CM | POA: Insufficient documentation

## 2020-04-03 DIAGNOSIS — S0181XA Laceration without foreign body of other part of head, initial encounter: Secondary | ICD-10-CM | POA: Insufficient documentation

## 2020-04-03 DIAGNOSIS — Y92009 Unspecified place in unspecified non-institutional (private) residence as the place of occurrence of the external cause: Secondary | ICD-10-CM

## 2020-04-03 DIAGNOSIS — I7090 Unspecified atherosclerosis: Secondary | ICD-10-CM | POA: Diagnosis not present

## 2020-04-03 DIAGNOSIS — I6389 Other cerebral infarction: Secondary | ICD-10-CM | POA: Diagnosis not present

## 2020-04-03 MED ORDER — LIDOCAINE-EPINEPHRINE (PF) 1 %-1:200000 IJ SOLN
INTRAMUSCULAR | Status: AC
  Filled 2020-04-03: qty 30

## 2020-04-03 MED ORDER — LIDOCAINE-EPINEPHRINE (PF) 2 %-1:200000 IJ SOLN: 20.0000 mL | Freq: Once | INTRAMUSCULAR | Status: DC

## 2020-04-03 MED ORDER — LIDOCAINE-EPINEPHRINE (PF) 1 %-1:200000 IJ SOLN
10.0000 mL | Freq: Once | INTRAMUSCULAR | Status: AC
  Administered 2020-04-03: 10 mL via INTRADERMAL

## 2020-04-03 NOTE — ED Provider Notes (Signed)
[facility] EMERGENCY DEPARTMENT Provider Note  CSN: [id number] Arrival date & time: 04/03/20 0043  Chief Complaint(s) Fall  HPI Daniel Morrow is a 79 y.o. male who presents after an unwitnessed fall at home.  Patient brought in by his grandson who lives with him.  Reports hearing him fall 45 minutes prior to arrival.  He believes patient was try to take off his sweatpants when he likely lost his balance and fell forward.  Patient was responsive when the grandson arrived.  Grandson noted laceration to the right forehead and bleeding.  No other injuries.  Patient was able to stand up and ambulate following.  Remainder of history, ROS, and physical exam limited due to patient's condition (dementia). Additional information was obtained from grandson.   Level V Caveat.    HPI  Past Medical History Past Medical History:  Diagnosis Date  . Aphagia   . Arthritis    "knees" (09/14/2018)  . Blind left eye   . Chronic bronchitis (HCC)   . Dementia (HCC)   . Depression   . GERD (gastroesophageal reflux disease)   . Glaucoma   . History of kidney stones   . Hyperlipidemia   . Hypertension   . Pneumonia    "maybe twice" (09/14/2018)  . Recurrent UTI (urinary tract infection)   . Stroke [facility](HCC) 11/2011   "didn't effect him at the time" (09/14/2018)  . Type II diabetes mellitus [facility](HCC)    Patient Active Problem List   Diagnosis Date Noted  . Hypoxia 10/04/2019  . Glaucoma 10/04/2019  . Foreign body in esophagus   . Esophageal stenosis   . Hiatal hernia   . Chronic anticoagulation   . Esophageal dysphagia   . Esophageal stricture   . Shortness of breath at rest 09/12/2018  . Shortness of breath 09/12/2018  . Vision, loss, sudden 06/03/2016  . CRAO (central retinal artery occlusion) 06/03/2016  . Alzheimer's disease (HCC) 01/16/2016  . Epididymo-orchitis 12/02/2013  . Orchitis and epididymitis 12/02/2013  . Mild cognitive impairment, so stated 02/17/2013  . CVA  (cerebral infarction) 02/17/2013  . 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 Hypertension 04/03/2012  . Type II diabetes mellitus (HCC) 04/03/2012  . Hyperlipidemia    Home Medication(s) Prior to Admission medications   Medication Sig Start Date End Date Taking? Authorizing Provider  amLODipine (NORVASC) 10 MG tablet Take 1 tablet (10 mg total) by mouth daily. 10/05/19 10/04/20  Burnadette PopAdhikari, Amrit, MD  atorvastatin (LIPITOR) 40 MG tablet Take 40 mg by mouth at bedtime.     [provider]  benazepril (LOTENSIN) 20 MG tablet Take 20 mg by mouth daily.     [provider]  brimonidine (ALPHAGAN) 0.2 % ophthalmic solution Place 1 drop into the right eye 2 (two) times daily.  02/25/18   [provider]  colesevelam (WELCHOL) 625 MG tablet Take 1,875 mg by mouth 2 (two) times daily with a meal.  12/22/18   [provider]  cyanocobalamin 100 MCG tablet Take 100 mcg by mouth daily.    [provider]  dipyridamole-aspirin (AGGRENOX) 200-25 MG 12hr capsule Take 1 capsule by mouth 2 (two) times daily.    [provider]  glimepiride (AMARYL) 4 MG tablet Take 4 mg by mouth daily.  06/12/16   [provider]  latanoprost (XALATAN) 0.005 % ophthalmic solution Place 1 drop into the right eye at  bedtime.  06/02/16   [provider]  memantine (NAMENDA) 10 MG tablet Take 1 tablet (10 mg total) by mouth 2 (two) times daily. 08/24/18   Micki Riley, MD  pantoprazole (PROTONIX) 40 MG tablet Take 1 tablet (40 mg total) by mouth 2 (two) times daily. 11/15/19   Meryl Dare, MD  Semaglutide, 1 MG/DOSE, (OZEMPIC, 1 MG/DOSE,) 2 MG/1.5ML SOPN Inject 2 mg into the skin every Monday.    [provider]  sertraline (ZOLOFT) 100 MG tablet Take 1 tablet (100 mg total) by mouth daily. 03/02/17   Micki Riley, MD    sucralfate (CARAFATE) 1 GM/10ML suspension Take 10 mLs (1 g total) by mouth 4 (four) times daily. Patient not taking: Reported on 11/15/2019 10/04/19 10/03/20  Cirigliano, Vito V, DO  timolol (TIMOPTIC) 0.5 % ophthalmic solution Place 1 drop into the right eye 2 (two) times daily.    [provider]  traZODone (DESYREL) 50 MG tablet Take 50 mg by mouth at bedtime.    [provider]                                                                                                                                    Past Surgical History Past Surgical History:  Procedure Laterality Date  . ABDOMINAL HERNIA REPAIR    . CATARACT EXTRACTION W/ INTRAOCULAR LENS  IMPLANT, BILATERAL Bilateral   . COLONOSCOPY    . ESOPHAGOGASTRODUODENOSCOPY N/A 01/11/2019   Procedure: ESOPHAGOGASTRODUODENOSCOPY (EGD);  Surgeon: Meryl Dare, MD;  Location: [facility] ENDOSCOPY;  Service: Endoscopy;  Laterality: N/A;  . ESOPHAGOGASTRODUODENOSCOPY Left 10/04/2019   Procedure: ESOPHAGOGASTRODUODENOSCOPY (EGD);  Surgeon: Shellia Cleverly, DO;  Location: [facility] ENDOSCOPY;  Service: Gastroenterology;  Laterality: Left;  . ESOPHAGOGASTRODUODENOSCOPY (EGD) WITH ESOPHAGEAL DILATION  X 2  . FOREIGN BODY REMOVAL  01/11/2019  . FRACTURE SURGERY    . HERNIA REPAIR    . IMPACTION REMOVAL  10/04/2019   Procedure: IMPACTION REMOVAL;  Surgeon: Shellia Cleverly, DO;  Location: [facility] ENDOSCOPY;  Service: Gastroenterology;;  . PATELLA FRACTURE SURGERY Left   . POSTERIOR CERVICAL FUSION/FORAMINOTOMY     Family History Family History  Problem Relation Age of Onset  . Polycythemia Mother   . Lung cancer Father   . Other Brother        brain tumor  . Diabetes Daughter   . Colon cancer Neg Hx   . Esophageal cancer Neg Hx   . Rectal cancer Neg Hx   . Stomach cancer Neg Hx     Social History Social History   Tobacco Use  . Smoking status: Former Smoker    Packs/day: 1.00    Years: 20.00    Pack years: 20.00    Types:  Cigarettes    Quit date: 1980    Years since quitting: 41.5  . Smokeless tobacco: Former Neurosurgeon    Quit date: 1985  Vaping Use  .  Vaping Use: Never used  Substance Use Topics  . Alcohol use: No  . Drug use: Never   Allergies Patient has no known allergies.  Review of Systems Review of Systems  Unable to perform ROS: Dementia    Physical Exam Vital Signs  I have reviewed the triage vital signs BP 136/71   Pulse 71   Temp 98.5 F (36.9 C) (Oral)   Resp 18   Ht  (1.803 m)   Wt 81.6 kg   SpO2 95%   BMI 25.10 kg/m   Physical Exam Constitutional:      General: He is not in acute distress.    Appearance: He is well-developed. He is not diaphoretic.  HENT:     Head: Normocephalic. Laceration present.      Right Ear: External ear normal.     Left Ear: External ear normal.  Eyes:     General: No scleral icterus.       Right eye: No discharge.        Left eye: No discharge.     Conjunctiva/sclera: Conjunctivae normal.     Pupils: Pupils are equal, round, and reactive to light.  Cardiovascular:     Rate and Rhythm: Regular rhythm.     Pulses:          Radial pulses are 2+ on the right side and 2+ on the left side.       Dorsalis pedis pulses are 2+ on the right side and 2+ on the left side.     Heart sounds: Normal heart sounds. No murmur heard.  No friction rub. No gallop.   Pulmonary:     Effort: Pulmonary effort is normal. No respiratory distress.     Breath sounds: Normal breath sounds. No stridor.  Abdominal:     General: There is no distension.     Palpations: Abdomen is soft.     Tenderness: There is no abdominal tenderness.  Musculoskeletal:     Cervical back: Normal range of motion and neck supple. No bony tenderness.     Thoracic back: No bony tenderness.     Lumbar back: No bony tenderness.     Comments: Clavicle stable. Chest stable to AP/Lat compression. Pelvis stable to Lat compression. No obvious extremity deformity. No chest or abdominal  wall contusion.  Skin:    General: Skin is warm.  Neurological:     Mental Status: He is alert.     GCS: GCS eye subscore is 4. GCS verbal subscore is 5. GCS motor subscore is 6.     Comments: Moving all extremities      ED Results and Treatments Labs (all labs ordered are listed, but only abnormal results are displayed) Labs Reviewed - No data to display                                                                                                                       EKG  EKG Interpretation  Date/Time:  Ventricular Rate:    PR Interval:    QRS Duration:   QT Interval:    QTC Calculation:   R Axis:     Text Interpretation:        Radiology CT Head Wo Contrast  Result Date: 04/03/2020 CLINICAL DATA:  79 year old male status post fall from standing. On anticoagulants. Right orbital laceration. EXAM: CT HEAD WITHOUT CONTRAST TECHNIQUE: Contiguous axial images were obtained from the base of the skull through the vertex without intravenous contrast. COMPARISON:  Head CT 01/25/2019 and earlier. FINDINGS: Brain: Cerebral volume is not significantly changed. No midline shift, ventriculomegaly, mass effect, evidence of mass lesion, intracranial hemorrhage or evidence of cortically based acute infarction. Patchy bilateral white matter hypodensity and several small chronic infarcts in the bilateral cerebellum appears stable. Stable gray-white matter differentiation throughout the brain. Vascular: Calcified atherosclerosis at the skull base. No suspicious intracranial vascular hyperdensity. Skull: Stable and intact. Sinuses/Orbits: Visualized paranasal sinuses and mastoids are stable and well pneumatized. Other: Right periorbital soft tissue swelling. Mild left forehead soft tissue swelling. No scalp or orbits soft tissue gas identified. IMPRESSION: 1. Right periorbital and left forehead soft tissue injury without underlying skull fracture. 2. No acute intracranial abnormality. Bilateral  white matter disease and small cerebellar infarcts appear stable since 2020. Electronically Signed   By: Odessa Fleming M.D.   On: 04/03/2020 01:34   CT Cervical Spine Wo Contrast  Result Date: 04/03/2020 CLINICAL DATA:  79 year old male status post fall from standing. On anticoagulants. Right orbital laceration. EXAM: CT CERVICAL SPINE WITHOUT CONTRAST TECHNIQUE: Multidetector CT imaging of the cervical spine was performed without intravenous contrast. Multiplanar CT image reconstructions were also generated. COMPARISON:  Head CT today reported separately. Cervical spine CT 06/27/2017. FINDINGS: Alignment: Mildly increased focal kyphosis at C4-C5 since 2018, although there was subtle anterolisthesis at that level previously. Cervicothoracic junction alignment and bilateral posterior element alignment remains normal. Skull base and vertebrae: Visualized skull base is intact. No atlanto-occipital dissociation. No acute osseous abnormality identified. Soft tissues and spinal canal: No prevertebral fluid or swelling. No visible canal hematoma. Negative noncontrast visible neck soft tissues aside from calcified carotid atherosclerosis. Disc levels: Chronic ankylosis of C2-C3, C5-C6. Advanced degeneration at C1-C2 (anterior C1-odontoid), C3-C4 (right side facet arthropathy), C4-C5 (disc and right side facet arthropathy), and C6-C7 (disc and endplate degeneration. Upper chest: Visible upper thoracic levels appear intact. Negative lung apices. IMPRESSION: 1. No acute traumatic injury identified in the cervical spine. 2. 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 Electronically Signed   By: Odessa Fleming M.D.   On: 04/03/2020 01:39    Pertinent labs & imaging results that were available during my care of the patient were reviewed by me and considered in my medical decision making (see chart for details).  Medications Ordered in ED Medications  lidocaine-EPINEPHrine (XYLOCAINE  W/EPI) 2 %-1:200000 (PF) injection 20 mL (20 mLs Intradermal Not Given 04/03/20 0300)  lidocaine-EPINEPHrine (XYLOCAINE-EPINEPHrine) 1 %-1:200000 (PF) injection 10 mL (10 mLs Intradermal Given by Other 04/03/20 0249)  Procedures .Marland KitchenLaceration Repair  Date/Time: 04/03/2020 2:39 AM Performed by: Nira Conn, MD Authorized by: Nira Conn, MD   Consent:    Consent obtained:  Verbal   Consent given by:  Guardian and healthcare agent   Risks discussed:  Need for additional repair, poor cosmetic result, poor wound healing and infection   Alternatives discussed:  Observation, delayed treatment and no treatment Anesthesia (see MAR for exact dosages):    Anesthesia method:  Local infiltration   Local anesthetic:  Lidocaine 2% WITH epi Laceration details:    Location:  Face   Facial location: right temple.   Length (cm):  2   Depth (mm):  4 Repair type:    Repair type:  Simple Pre-procedure details:    Preparation:  Patient was prepped and draped in usual sterile fashion and imaging obtained to evaluate for foreign bodies Exploration:    Hemostasis achieved with:  Direct pressure   Wound exploration: wound explored through full range of motion and entire depth of wound probed and visualized     Wound extent: no foreign bodies/material noted     Contaminated: no   Treatment:    Amount of cleaning:  Standard   Irrigation solution:  Sterile saline   Irrigation method:  Pressure wash Skin repair:    Repair method:  Sutures   Suture size:  5-0   Suture material:  Fast-absorbing gut   Suture technique:  Running   Number of sutures:  8 Approximation:    Approximation:  Close Post-procedure details:    Dressing:  Antibiotic ointment and non-adherent dressing   Patient tolerance of procedure:  Tolerated well, no immediate  complications    (including critical care time)  Medical Decision Making / ED Course I have reviewed the nursing notes for this encounter and the patient's prior records (if available in EHR or on provided paperwork).   DELMAN GOSHORN was evaluated in Emergency Department on 04/03/2020 for the symptoms described in the history of present illness. He was evaluated in the context of the global COVID-19 pandemic, which necessitated consideration that the patient might be at risk for infection with the SARS-CoV-2 virus that causes COVID-19. Institutional protocols and algorithms that pertain to the evaluation of patients at risk for COVID-19 are in a state of rapid change based on information released by regulatory bodies including the CDC and federal and state organizations. These policies and algorithms were followed during the patient's care in the ED.  CT head and cervical spine negative for acute injuries. Lac irrigated and closed as above.      Final Clinical Impression(s) / ED Diagnoses Final diagnoses:  Fall in home, initial encounter  Facial laceration, initial encounter    The patient appears reasonably screened and/or stabilized for discharge and I doubt any other medical condition or other [facility] requiring further screening, evaluation, or treatment in the ED at this time prior to discharge. Safe for discharge with strict return precautions.  Disposition: Discharge  Condition: Good  I have discussed the results, Dx and Tx plan with the patient/family who expressed understanding and agree(s) with the plan. Discharge instructions discussed at length. The patient/family was given strict return precautions who verbalized understanding of the instructions. No further questions at time of discharge.    ED Discharge Orders    None      This chart was dictated using voice recognition software.  Despite best efforts to proofread,  errors can occur which can change the documentation  meaning.   Nira Conn, MD 04/03/20 [phone]

## 2020-04-03 NOTE — ED Triage Notes (Signed)
Pt from home, fall from standing trying to remove pants ~30 mins ago, lac noted to right orbital area, bleeding controlled. Pt on anticoags. Pt @ baseline per grandson, pt w frontal lobe dementia

## 2020-04-13 ENCOUNTER — Emergency Department (HOSPITAL_BASED_OUTPATIENT_CLINIC_OR_DEPARTMENT_OTHER): Payer: Medicare HMO

## 2020-04-13 ENCOUNTER — Other Ambulatory Visit: Payer: Self-pay

## 2020-04-13 ENCOUNTER — Emergency Department (HOSPITAL_BASED_OUTPATIENT_CLINIC_OR_DEPARTMENT_OTHER)
Admission: EM | Admit: 2020-04-13 | Discharge: 2020-04-14 | Disposition: A | Discharge status: Home / Self Care | Payer: Medicare HMO | Attending: Emergency Medicine | Admitting: Emergency Medicine

## 2020-04-13 ENCOUNTER — Encounter (HOSPITAL_BASED_OUTPATIENT_CLINIC_OR_DEPARTMENT_OTHER): Payer: Self-pay

## 2020-04-13 DIAGNOSIS — R296 Repeated falls: Secondary | ICD-10-CM

## 2020-04-13 DIAGNOSIS — S0990XA Unspecified injury of head, initial encounter: Secondary | ICD-10-CM | POA: Insufficient documentation

## 2020-04-13 DIAGNOSIS — Y9289 Other specified places as the place of occurrence of the external cause: Secondary | ICD-10-CM | POA: Diagnosis not present

## 2020-04-13 DIAGNOSIS — G9389 Other specified disorders of brain: Secondary | ICD-10-CM | POA: Diagnosis not present

## 2020-04-13 DIAGNOSIS — I10 Essential (primary) hypertension: Secondary | ICD-10-CM | POA: Insufficient documentation

## 2020-04-13 DIAGNOSIS — S0081XA Abrasion of other part of head, initial encounter: Secondary | ICD-10-CM | POA: Diagnosis not present

## 2020-04-13 DIAGNOSIS — E119 Type 2 diabetes mellitus without complications: Secondary | ICD-10-CM | POA: Insufficient documentation

## 2020-04-13 DIAGNOSIS — W19XXXA Unspecified fall, initial encounter: Secondary | ICD-10-CM | POA: Diagnosis not present

## 2020-04-13 DIAGNOSIS — R220 Localized swelling, mass and lump, head: Secondary | ICD-10-CM | POA: Diagnosis not present

## 2020-04-13 DIAGNOSIS — S0181XA Laceration without foreign body of other part of head, initial encounter: Secondary | ICD-10-CM | POA: Diagnosis not present

## 2020-04-13 DIAGNOSIS — Y939 Activity, unspecified: Secondary | ICD-10-CM | POA: Insufficient documentation

## 2020-04-13 DIAGNOSIS — S199XXA Unspecified injury of neck, initial encounter: Secondary | ICD-10-CM | POA: Diagnosis not present

## 2020-04-13 DIAGNOSIS — Z87891 Personal history of nicotine dependence: Secondary | ICD-10-CM | POA: Insufficient documentation

## 2020-04-13 DIAGNOSIS — Y999 Unspecified external cause status: Secondary | ICD-10-CM | POA: Insufficient documentation

## 2020-04-13 DIAGNOSIS — G319 Degenerative disease of nervous system, unspecified: Secondary | ICD-10-CM | POA: Diagnosis not present

## 2020-04-13 NOTE — ED Triage Notes (Signed)
Pt presents s/p fall on concrete. Pt has injury to R forehead. Pt on blood thinners. Per pt's wife pt has been at his baseline mental status since the fall. Pt with hx of dementia.

## 2020-04-14 MED ORDER — LIDOCAINE-EPINEPHRINE 1 %-1:100000 IJ SOLN
INTRAMUSCULAR | Status: AC
  Administered 2020-04-14: 1 mL
  Filled 2020-04-14: qty 1

## 2020-04-14 NOTE — Discharge Instructions (Signed)
The sutures placed in Daniel Morrow wounds will dissolve on their own.  They do not need to be removed and should be kept in place to help prevent further bleeding.

## 2020-04-14 NOTE — ED Provider Notes (Signed)
[facility] Provider Note: Lowella Dell, MD, FACEP  CSN: [id number] MRN: [id number] ARRIVAL: 04/13/20 at 2123 ROOM: MH09/MH09   CHIEF COMPLAINT  Fall  Level 5 caveat: Dementia HISTORY OF PRESENT ILLNESS  04/14/20 12:13 AM Daniel Morrow is a 79 y.o. male who had an unwitnessed fall yesterday evening while his wife is not watching.  She found him on the ground with bleeding from his right temple.  This is the site of a recently sutured laceration and deep abrasion.  It is unknown if he had a loss of consciousness.  He is at his baseline mental status currently.  He has been ambulating and moving his limbs without evidence of injury there.  He is blind in the left eye.  He is nonverbal at baseline.   Past Medical History:  Diagnosis Date  . Aphagia   . Arthritis    "knees" (09/14/2018)  . Blind left eye   . Chronic bronchitis (HCC)   . Dementia (HCC)   . Depression   . GERD (gastroesophageal reflux disease)   . Glaucoma   . History of kidney stones   . Hyperlipidemia   . Hypertension   . Pneumonia    "maybe twice" (09/14/2018)  . Recurrent UTI (urinary tract infection)   . Stroke [facility]) 11/2011   "didn't effect him at the time" (09/14/2018)  . Type II diabetes mellitus (HCC)     Past Surgical History:  Procedure Laterality Date  . ABDOMINAL HERNIA REPAIR    . CATARACT EXTRACTION W/ INTRAOCULAR LENS  IMPLANT, BILATERAL Bilateral   . COLONOSCOPY    . ESOPHAGOGASTRODUODENOSCOPY N/A 01/11/2019   Procedure: ESOPHAGOGASTRODUODENOSCOPY (EGD);  Surgeon: Meryl Dare, MD;  Location: [facility] ENDOSCOPY;  Service: Endoscopy;  Laterality: N/A;  . ESOPHAGOGASTRODUODENOSCOPY Left 10/04/2019   Procedure: ESOPHAGOGASTRODUODENOSCOPY (EGD);  Surgeon: Shellia Cleverly, DO;  Location: [facility] ENDOSCOPY;  Service: Gastroenterology;  Laterality: Left;  . ESOPHAGOGASTRODUODENOSCOPY (EGD) WITH ESOPHAGEAL DILATION  X 2  . FOREIGN BODY REMOVAL  01/11/2019  . FRACTURE SURGERY    .  HERNIA REPAIR    . IMPACTION REMOVAL  10/04/2019   Procedure: IMPACTION REMOVAL;  Surgeon: Shellia Cleverly, DO;  Location: [facility] ENDOSCOPY;  Service: Gastroenterology;;  . PATELLA FRACTURE SURGERY Left   . POSTERIOR CERVICAL FUSION/FORAMINOTOMY      Family History  Problem Relation Age of Onset  . Polycythemia Mother   . Lung cancer Father   . Other Brother        brain tumor  . Diabetes Daughter   . Colon cancer Neg Hx   . Esophageal cancer Neg Hx   . Rectal cancer Neg Hx   . Stomach cancer Neg Hx     Social History   Tobacco Use  . Smoking status: Former Smoker    Packs/day: 1.00    Years: 20.00    Pack years: 20.00    Types: Cigarettes    Quit date: 1980    Years since quitting: 41.5  . Smokeless tobacco: Former Neurosurgeon    Quit date: 1985  Vaping Use  . Vaping Use: Never used  Substance Use Topics  . Alcohol use: No  . Drug use: Never    Prior to Admission medications   Medication Sig Start Date End Date Taking? Authorizing Provider  amLODipine (NORVASC) 10 MG tablet Take 1 tablet (10 mg total) by mouth daily. 10/05/19 10/04/20  Burnadette Pop, MD  atorvastatin (LIPITOR) 40 MG tablet Take 40 mg by mouth at bedtime.  [provider]  benazepril (LOTENSIN) 20 MG tablet Take 20 mg by mouth daily.     [provider]  brimonidine (ALPHAGAN) 0.2 % ophthalmic solution Place 1 drop into the right eye 2 (two) times daily.  02/25/18   [provider]  colesevelam (WELCHOL) 625 MG tablet Take 1,875 mg by mouth 2 (two) times daily with a meal.  12/22/18   [provider]  cyanocobalamin 100 MCG tablet Take 100 mcg by mouth daily.    [provider]  dipyridamole-aspirin (AGGRENOX) 200-25 MG 12hr capsule Take 1 capsule by mouth 2 (two) times daily.    [provider]  glimepiride (AMARYL) 4 MG tablet Take 4 mg by mouth daily.  06/12/16   [provider]  latanoprost (XALATAN) 0.005 % ophthalmic solution Place 1 drop into  the right eye at bedtime.  06/02/16   [provider]  memantine (NAMENDA) 10 MG tablet Take 1 tablet (10 mg total) by mouth 2 (two) times daily. 08/24/18   Micki Riley, MD  pantoprazole (PROTONIX) 40 MG tablet Take 1 tablet (40 mg total) by mouth 2 (two) times daily. 11/15/19   Meryl Dare, MD  Semaglutide, 1 MG/DOSE, (OZEMPIC, 1 MG/DOSE,) 2 MG/1.5ML SOPN Inject 2 mg into the skin every Monday.    [provider]  sertraline (ZOLOFT) 100 MG tablet Take 1 tablet (100 mg total) by mouth daily. 03/02/17   Micki Riley, MD  timolol (TIMOPTIC) 0.5 % ophthalmic solution Place 1 drop into the right eye 2 (two) times daily.    [provider]  traZODone (DESYREL) 50 MG tablet Take 50 mg by mouth at bedtime.    [provider]  sucralfate (CARAFATE) 1 GM/10ML suspension Take 10 mLs (1 g total) by mouth 4 (four) times daily. Patient not taking: Reported on 11/15/2019 10/04/19 04/14/20  Shellia Cleverly, DO    Allergies Patient has no known allergies.   REVIEW OF SYSTEMS     PHYSICAL EXAMINATION  Initial Vital Signs Blood pressure (!) 157/76, pulse 68, temperature 99 F (37.2 C), temperature source Oral, resp. rate 23, height 5\' 11"  (1.803 m), weight 81.6 kg, SpO2 95 %.  Examination General: Well-developed, well-nourished male in no acute distress; appearance consistent with age of record HENT: normocephalic; deep abrasion and dehiscing lacerations of right temple with brisk bleeding:    Eyes: Left pupil round and minimally reactive; right pupil round and reactive; extraocular muscles grossly intact Neck: supple Heart: regular rate and rhythm Lungs: clear to auscultation bilaterally Abdomen: soft; nondistended; nontender; bowel sounds present Extremities: No deformity; full range of motion; pulses normal Neurologic: Awake, alert; nonverbal; motor function intact in all extremities and symmetric; no facial droop Skin: Warm and dry Psychiatric:  Normal mood and affect   RESULTS  Summary of this visit's results, reviewed and interpreted by myself:   EKG Interpretation  Date/Time:    Ventricular Rate:    PR Interval:    QRS Duration:   QT Interval:    QTC Calculation:   R Axis:     Text Interpretation:        Laboratory Studies: No results found for this or any previous visit (from the past 24 hour(s)). Imaging Studies: CT Head Wo Contrast  Result Date: 04/13/2020 CLINICAL DATA:  Status post fall. EXAM: CT HEAD WITHOUT CONTRAST TECHNIQUE: Contiguous axial images were obtained from the base of the skull through the vertex without intravenous contrast. COMPARISON:  April 03, 2020 FINDINGS: Brain:  There is moderate severity cerebral atrophy with widening of the extra-axial spaces and ventricular dilatation. There are areas of decreased attenuation within the white matter tracts of the supratentorial brain, consistent with microvascular disease changes. Vascular: No hyperdense vessel or unexpected calcification. Skull: Negative for acute fracture. A stable 1.1 cm x 0.8 cm lytic focus is seen within the left frontal region of the skull. Sinuses/Orbits: No acute finding. Other: There is mild right frontal scalp soft tissue swelling. IMPRESSION: 1. Generalized cerebral atrophy. 2. No acute intracranial abnormality. 3. Mild right frontal scalp soft tissue swelling. Electronically Signed   By: Aram Candela M.D.   On: 04/13/2020 21:55   CT Cervical Spine Wo Contrast  Result Date: 04/13/2020 CLINICAL DATA:  Status post fall. EXAM: CT CERVICAL SPINE WITHOUT CONTRAST TECHNIQUE: Multidetector CT imaging of the cervical spine was performed without intravenous contrast. Multiplanar CT image reconstructions were also generated. COMPARISON:  April 03, 2020 FINDINGS: Alignment: Normal. Skull base and vertebrae: No acute fracture. No primary bone lesion or focal pathologic process. Soft tissues and spinal canal: No prevertebral fluid or swelling.  No visible canal hematoma. Disc levels: Moderate to marked severity endplate sclerosis is seen at the levels of C2-C3, C5-C6 and C6-C7. Moderate to marked severity intervertebral disc space narrowing is also seen at these levels. Marked severity bilateral multilevel facet joint hypertrophy is noted. Upper chest: Negative. Other: None. IMPRESSION: 1. No acute fracture within the cervical spine. 2. Moderate to marked severity multilevel degenerative changes. Electronically Signed   By: Aram Candela M.D.   On: 04/13/2020 21:58    ED COURSE and MDM  Nursing notes, initial and subsequent vitals signs, including pulse oximetry, reviewed and interpreted by myself.  Vitals:   04/13/20 2132 04/13/20 2134 04/14/20 0000 04/14/20 0030  BP: (!) 144/72  (!) 157/76 (!) 155/77  Pulse: 70  68 70  Resp: 16  23 23   Temp: 99 F (37.2 C)     TempSrc: Oral     SpO2: 96%  95% 95%  Weight:  81.6 kg    Height:  5\' 11"  (1.803 m)     Medications  lidocaine-EPINEPHrine (XYLOCAINE W/EPI) 1 %-1:100000 (with pres) injection (has no administration in time range)    No evidence of significant injury by CT scans.  The patient's dehisced wounds were repaired as described below.  PROCEDURES  Procedures  LACERATION REPAIRS Performed by: Chanon Loney Authorized by: Jaedah Lords Consent: Verbal consent obtained. Risks and benefits: risks, benefits and alternatives were discussed Consent given by: patient Patient identity confirmed: provided demographic data Prepped and Draped in normal sterile fashion Wound explored  Laceration Location: Right temple x2  Laceration Length: 1cm and 2 cm respectively  No Foreign Bodies seen or palpated  Anesthesia: local infiltration  Local anesthetic: lidocaine 1% with epinephrine  Anesthetic total: 3 ml  Irrigation method: syringe Amount of cleaning: standard  Skin closure: 4-0 Vicryl  Number of sutures: 4 and 4 respectively  Technique: Simple  interrupted  Patient tolerance: Patient tolerated the procedure well with no immediate complications.     ED DIAGNOSES     ICD-10-CM   1. Unwitnessed fall  R29.6   2. Laceration of face with complication, initial encounter  S01.81XA   3. Abrasion of face, initial encounter  S00.81XA   4. Minor head injury, initial encounter  S09.90XA        Jceon Alverio, Carlisle Beers, MD 04/14/20 [phone]

## 2020-05-01 DIAGNOSIS — R69 Illness, unspecified: Secondary | ICD-10-CM | POA: Diagnosis not present

## 2020-05-01 DIAGNOSIS — F0280 Dementia in other diseases classified elsewhere without behavioral disturbance: Secondary | ICD-10-CM | POA: Diagnosis not present

## 2020-05-11 DIAGNOSIS — E1122 Type 2 diabetes mellitus with diabetic chronic kidney disease: Secondary | ICD-10-CM | POA: Diagnosis not present

## 2020-05-11 DIAGNOSIS — E7800 Pure hypercholesterolemia, unspecified: Secondary | ICD-10-CM | POA: Diagnosis not present

## 2020-05-11 DIAGNOSIS — E1142 Type 2 diabetes mellitus with diabetic polyneuropathy: Secondary | ICD-10-CM | POA: Diagnosis not present

## 2020-05-11 DIAGNOSIS — N401 Enlarged prostate with lower urinary tract symptoms: Secondary | ICD-10-CM | POA: Diagnosis not present

## 2020-05-11 DIAGNOSIS — I10 Essential (primary) hypertension: Secondary | ICD-10-CM | POA: Diagnosis not present

## 2020-05-11 DIAGNOSIS — E1165 Type 2 diabetes mellitus with hyperglycemia: Secondary | ICD-10-CM | POA: Diagnosis not present

## 2020-05-11 DIAGNOSIS — E785 Hyperlipidemia, unspecified: Secondary | ICD-10-CM | POA: Diagnosis not present

## 2020-05-11 DIAGNOSIS — R69 Illness, unspecified: Secondary | ICD-10-CM | POA: Diagnosis not present

## 2020-05-11 DIAGNOSIS — G3109 Other frontotemporal dementia: Secondary | ICD-10-CM | POA: Diagnosis not present

## 2020-05-12 DIAGNOSIS — R69 Illness, unspecified: Secondary | ICD-10-CM | POA: Diagnosis not present

## 2020-05-12 DIAGNOSIS — G309 Alzheimer's disease, unspecified: Secondary | ICD-10-CM | POA: Diagnosis not present

## 2020-05-12 DIAGNOSIS — E1122 Type 2 diabetes mellitus with diabetic chronic kidney disease: Secondary | ICD-10-CM | POA: Diagnosis not present

## 2020-05-12 DIAGNOSIS — E785 Hyperlipidemia, unspecified: Secondary | ICD-10-CM | POA: Diagnosis not present

## 2020-05-12 DIAGNOSIS — G3101 Pick's disease: Secondary | ICD-10-CM | POA: Diagnosis not present

## 2020-05-12 DIAGNOSIS — N182 Chronic kidney disease, stage 2 (mild): Secondary | ICD-10-CM | POA: Diagnosis not present

## 2020-05-12 DIAGNOSIS — E7800 Pure hypercholesterolemia, unspecified: Secondary | ICD-10-CM | POA: Diagnosis not present

## 2020-05-12 DIAGNOSIS — I129 Hypertensive chronic kidney disease with stage 1 through stage 4 chronic kidney disease, or unspecified chronic kidney disease: Secondary | ICD-10-CM | POA: Diagnosis not present

## 2020-05-16 DIAGNOSIS — I129 Hypertensive chronic kidney disease with stage 1 through stage 4 chronic kidney disease, or unspecified chronic kidney disease: Secondary | ICD-10-CM | POA: Diagnosis not present

## 2020-05-16 DIAGNOSIS — R69 Illness, unspecified: Secondary | ICD-10-CM | POA: Diagnosis not present

## 2020-05-16 DIAGNOSIS — G3101 Pick's disease: Secondary | ICD-10-CM | POA: Diagnosis not present

## 2020-05-16 DIAGNOSIS — G309 Alzheimer's disease, unspecified: Secondary | ICD-10-CM | POA: Diagnosis not present

## 2020-05-16 DIAGNOSIS — E785 Hyperlipidemia, unspecified: Secondary | ICD-10-CM | POA: Diagnosis not present

## 2020-05-16 DIAGNOSIS — N182 Chronic kidney disease, stage 2 (mild): Secondary | ICD-10-CM | POA: Diagnosis not present

## 2020-05-16 DIAGNOSIS — E7800 Pure hypercholesterolemia, unspecified: Secondary | ICD-10-CM | POA: Diagnosis not present

## 2020-05-16 DIAGNOSIS — E1122 Type 2 diabetes mellitus with diabetic chronic kidney disease: Secondary | ICD-10-CM | POA: Diagnosis not present

## 2020-05-18 DIAGNOSIS — E7800 Pure hypercholesterolemia, unspecified: Secondary | ICD-10-CM | POA: Diagnosis not present

## 2020-05-18 DIAGNOSIS — N182 Chronic kidney disease, stage 2 (mild): Secondary | ICD-10-CM | POA: Diagnosis not present

## 2020-05-18 DIAGNOSIS — I129 Hypertensive chronic kidney disease with stage 1 through stage 4 chronic kidney disease, or unspecified chronic kidney disease: Secondary | ICD-10-CM | POA: Diagnosis not present

## 2020-05-18 DIAGNOSIS — G309 Alzheimer's disease, unspecified: Secondary | ICD-10-CM | POA: Diagnosis not present

## 2020-05-18 DIAGNOSIS — G3101 Pick's disease: Secondary | ICD-10-CM | POA: Diagnosis not present

## 2020-05-18 DIAGNOSIS — R69 Illness, unspecified: Secondary | ICD-10-CM | POA: Diagnosis not present

## 2020-05-18 DIAGNOSIS — E1122 Type 2 diabetes mellitus with diabetic chronic kidney disease: Secondary | ICD-10-CM | POA: Diagnosis not present

## 2020-05-18 DIAGNOSIS — E785 Hyperlipidemia, unspecified: Secondary | ICD-10-CM | POA: Diagnosis not present

## 2020-05-21 DIAGNOSIS — R69 Illness, unspecified: Secondary | ICD-10-CM | POA: Diagnosis not present

## 2020-05-21 DIAGNOSIS — E1122 Type 2 diabetes mellitus with diabetic chronic kidney disease: Secondary | ICD-10-CM | POA: Diagnosis not present

## 2020-05-21 DIAGNOSIS — I129 Hypertensive chronic kidney disease with stage 1 through stage 4 chronic kidney disease, or unspecified chronic kidney disease: Secondary | ICD-10-CM | POA: Diagnosis not present

## 2020-05-21 DIAGNOSIS — N182 Chronic kidney disease, stage 2 (mild): Secondary | ICD-10-CM | POA: Diagnosis not present

## 2020-05-21 DIAGNOSIS — E7800 Pure hypercholesterolemia, unspecified: Secondary | ICD-10-CM | POA: Diagnosis not present

## 2020-05-21 DIAGNOSIS — E785 Hyperlipidemia, unspecified: Secondary | ICD-10-CM | POA: Diagnosis not present

## 2020-05-21 DIAGNOSIS — G309 Alzheimer's disease, unspecified: Secondary | ICD-10-CM | POA: Diagnosis not present

## 2020-05-21 DIAGNOSIS — G3101 Pick's disease: Secondary | ICD-10-CM | POA: Diagnosis not present

## 2020-05-23 DIAGNOSIS — R69 Illness, unspecified: Secondary | ICD-10-CM | POA: Diagnosis not present

## 2020-05-23 DIAGNOSIS — E1122 Type 2 diabetes mellitus with diabetic chronic kidney disease: Secondary | ICD-10-CM | POA: Diagnosis not present

## 2020-05-23 DIAGNOSIS — E785 Hyperlipidemia, unspecified: Secondary | ICD-10-CM | POA: Diagnosis not present

## 2020-05-23 DIAGNOSIS — I129 Hypertensive chronic kidney disease with stage 1 through stage 4 chronic kidney disease, or unspecified chronic kidney disease: Secondary | ICD-10-CM | POA: Diagnosis not present

## 2020-05-23 DIAGNOSIS — G309 Alzheimer's disease, unspecified: Secondary | ICD-10-CM | POA: Diagnosis not present

## 2020-05-23 DIAGNOSIS — G3101 Pick's disease: Secondary | ICD-10-CM | POA: Diagnosis not present

## 2020-05-23 DIAGNOSIS — N182 Chronic kidney disease, stage 2 (mild): Secondary | ICD-10-CM | POA: Diagnosis not present

## 2020-05-23 DIAGNOSIS — E7800 Pure hypercholesterolemia, unspecified: Secondary | ICD-10-CM | POA: Diagnosis not present

## 2020-05-29 DIAGNOSIS — R69 Illness, unspecified: Secondary | ICD-10-CM | POA: Diagnosis not present

## 2020-05-29 DIAGNOSIS — N182 Chronic kidney disease, stage 2 (mild): Secondary | ICD-10-CM | POA: Diagnosis not present

## 2020-05-29 DIAGNOSIS — E7800 Pure hypercholesterolemia, unspecified: Secondary | ICD-10-CM | POA: Diagnosis not present

## 2020-05-29 DIAGNOSIS — G309 Alzheimer's disease, unspecified: Secondary | ICD-10-CM | POA: Diagnosis not present

## 2020-05-29 DIAGNOSIS — G3101 Pick's disease: Secondary | ICD-10-CM | POA: Diagnosis not present

## 2020-05-29 DIAGNOSIS — E785 Hyperlipidemia, unspecified: Secondary | ICD-10-CM | POA: Diagnosis not present

## 2020-05-29 DIAGNOSIS — I129 Hypertensive chronic kidney disease with stage 1 through stage 4 chronic kidney disease, or unspecified chronic kidney disease: Secondary | ICD-10-CM | POA: Diagnosis not present

## 2020-05-29 DIAGNOSIS — E1122 Type 2 diabetes mellitus with diabetic chronic kidney disease: Secondary | ICD-10-CM | POA: Diagnosis not present

## 2020-05-31 DIAGNOSIS — G309 Alzheimer's disease, unspecified: Secondary | ICD-10-CM | POA: Diagnosis not present

## 2020-05-31 DIAGNOSIS — I129 Hypertensive chronic kidney disease with stage 1 through stage 4 chronic kidney disease, or unspecified chronic kidney disease: Secondary | ICD-10-CM | POA: Diagnosis not present

## 2020-05-31 DIAGNOSIS — E1122 Type 2 diabetes mellitus with diabetic chronic kidney disease: Secondary | ICD-10-CM | POA: Diagnosis not present

## 2020-05-31 DIAGNOSIS — G3101 Pick's disease: Secondary | ICD-10-CM | POA: Diagnosis not present

## 2020-05-31 DIAGNOSIS — R69 Illness, unspecified: Secondary | ICD-10-CM | POA: Diagnosis not present

## 2020-05-31 DIAGNOSIS — E7800 Pure hypercholesterolemia, unspecified: Secondary | ICD-10-CM | POA: Diagnosis not present

## 2020-05-31 DIAGNOSIS — E785 Hyperlipidemia, unspecified: Secondary | ICD-10-CM | POA: Diagnosis not present

## 2020-05-31 DIAGNOSIS — N182 Chronic kidney disease, stage 2 (mild): Secondary | ICD-10-CM | POA: Diagnosis not present

## 2020-06-04 DIAGNOSIS — R69 Illness, unspecified: Secondary | ICD-10-CM | POA: Diagnosis not present

## 2020-06-04 DIAGNOSIS — G3101 Pick's disease: Secondary | ICD-10-CM | POA: Diagnosis not present

## 2020-06-04 DIAGNOSIS — I129 Hypertensive chronic kidney disease with stage 1 through stage 4 chronic kidney disease, or unspecified chronic kidney disease: Secondary | ICD-10-CM | POA: Diagnosis not present

## 2020-06-04 DIAGNOSIS — N182 Chronic kidney disease, stage 2 (mild): Secondary | ICD-10-CM | POA: Diagnosis not present

## 2020-06-04 DIAGNOSIS — E785 Hyperlipidemia, unspecified: Secondary | ICD-10-CM | POA: Diagnosis not present

## 2020-06-04 DIAGNOSIS — E1122 Type 2 diabetes mellitus with diabetic chronic kidney disease: Secondary | ICD-10-CM | POA: Diagnosis not present

## 2020-06-04 DIAGNOSIS — G309 Alzheimer's disease, unspecified: Secondary | ICD-10-CM | POA: Diagnosis not present

## 2020-06-04 DIAGNOSIS — E7800 Pure hypercholesterolemia, unspecified: Secondary | ICD-10-CM | POA: Diagnosis not present

## 2020-06-06 DIAGNOSIS — R69 Illness, unspecified: Secondary | ICD-10-CM | POA: Diagnosis not present

## 2020-06-06 DIAGNOSIS — G3101 Pick's disease: Secondary | ICD-10-CM | POA: Diagnosis not present

## 2020-06-06 DIAGNOSIS — G309 Alzheimer's disease, unspecified: Secondary | ICD-10-CM | POA: Diagnosis not present

## 2020-06-06 DIAGNOSIS — E785 Hyperlipidemia, unspecified: Secondary | ICD-10-CM | POA: Diagnosis not present

## 2020-06-06 DIAGNOSIS — N182 Chronic kidney disease, stage 2 (mild): Secondary | ICD-10-CM | POA: Diagnosis not present

## 2020-06-06 DIAGNOSIS — I129 Hypertensive chronic kidney disease with stage 1 through stage 4 chronic kidney disease, or unspecified chronic kidney disease: Secondary | ICD-10-CM | POA: Diagnosis not present

## 2020-06-06 DIAGNOSIS — E1122 Type 2 diabetes mellitus with diabetic chronic kidney disease: Secondary | ICD-10-CM | POA: Diagnosis not present

## 2020-06-06 DIAGNOSIS — E7800 Pure hypercholesterolemia, unspecified: Secondary | ICD-10-CM | POA: Diagnosis not present

## 2020-06-11 DIAGNOSIS — N182 Chronic kidney disease, stage 2 (mild): Secondary | ICD-10-CM | POA: Diagnosis not present

## 2020-06-11 DIAGNOSIS — E7800 Pure hypercholesterolemia, unspecified: Secondary | ICD-10-CM | POA: Diagnosis not present

## 2020-06-11 DIAGNOSIS — E1122 Type 2 diabetes mellitus with diabetic chronic kidney disease: Secondary | ICD-10-CM | POA: Diagnosis not present

## 2020-06-11 DIAGNOSIS — G309 Alzheimer's disease, unspecified: Secondary | ICD-10-CM | POA: Diagnosis not present

## 2020-06-11 DIAGNOSIS — R69 Illness, unspecified: Secondary | ICD-10-CM | POA: Diagnosis not present

## 2020-06-11 DIAGNOSIS — E785 Hyperlipidemia, unspecified: Secondary | ICD-10-CM | POA: Diagnosis not present

## 2020-06-11 DIAGNOSIS — I129 Hypertensive chronic kidney disease with stage 1 through stage 4 chronic kidney disease, or unspecified chronic kidney disease: Secondary | ICD-10-CM | POA: Diagnosis not present

## 2020-06-11 DIAGNOSIS — G3101 Pick's disease: Secondary | ICD-10-CM | POA: Diagnosis not present

## 2020-06-13 DIAGNOSIS — E7800 Pure hypercholesterolemia, unspecified: Secondary | ICD-10-CM | POA: Diagnosis not present

## 2020-06-13 DIAGNOSIS — N182 Chronic kidney disease, stage 2 (mild): Secondary | ICD-10-CM | POA: Diagnosis not present

## 2020-06-13 DIAGNOSIS — E785 Hyperlipidemia, unspecified: Secondary | ICD-10-CM | POA: Diagnosis not present

## 2020-06-13 DIAGNOSIS — E1122 Type 2 diabetes mellitus with diabetic chronic kidney disease: Secondary | ICD-10-CM | POA: Diagnosis not present

## 2020-06-13 DIAGNOSIS — I129 Hypertensive chronic kidney disease with stage 1 through stage 4 chronic kidney disease, or unspecified chronic kidney disease: Secondary | ICD-10-CM | POA: Diagnosis not present

## 2020-06-13 DIAGNOSIS — G309 Alzheimer's disease, unspecified: Secondary | ICD-10-CM | POA: Diagnosis not present

## 2020-06-13 DIAGNOSIS — G3101 Pick's disease: Secondary | ICD-10-CM | POA: Diagnosis not present

## 2020-06-13 DIAGNOSIS — R69 Illness, unspecified: Secondary | ICD-10-CM | POA: Diagnosis not present

## 2020-06-18 DIAGNOSIS — G309 Alzheimer's disease, unspecified: Secondary | ICD-10-CM | POA: Diagnosis not present

## 2020-06-18 DIAGNOSIS — G3101 Pick's disease: Secondary | ICD-10-CM | POA: Diagnosis not present

## 2020-06-18 DIAGNOSIS — E7800 Pure hypercholesterolemia, unspecified: Secondary | ICD-10-CM | POA: Diagnosis not present

## 2020-06-18 DIAGNOSIS — N182 Chronic kidney disease, stage 2 (mild): Secondary | ICD-10-CM | POA: Diagnosis not present

## 2020-06-18 DIAGNOSIS — E785 Hyperlipidemia, unspecified: Secondary | ICD-10-CM | POA: Diagnosis not present

## 2020-06-18 DIAGNOSIS — I129 Hypertensive chronic kidney disease with stage 1 through stage 4 chronic kidney disease, or unspecified chronic kidney disease: Secondary | ICD-10-CM | POA: Diagnosis not present

## 2020-06-18 DIAGNOSIS — E1122 Type 2 diabetes mellitus with diabetic chronic kidney disease: Secondary | ICD-10-CM | POA: Diagnosis not present

## 2020-06-18 DIAGNOSIS — R69 Illness, unspecified: Secondary | ICD-10-CM | POA: Diagnosis not present

## 2020-06-18 DIAGNOSIS — G3109 Other frontotemporal dementia: Secondary | ICD-10-CM | POA: Diagnosis not present

## 2020-06-20 DIAGNOSIS — R69 Illness, unspecified: Secondary | ICD-10-CM | POA: Diagnosis not present

## 2020-06-20 DIAGNOSIS — G309 Alzheimer's disease, unspecified: Secondary | ICD-10-CM | POA: Diagnosis not present

## 2020-06-20 DIAGNOSIS — E785 Hyperlipidemia, unspecified: Secondary | ICD-10-CM | POA: Diagnosis not present

## 2020-06-20 DIAGNOSIS — N182 Chronic kidney disease, stage 2 (mild): Secondary | ICD-10-CM | POA: Diagnosis not present

## 2020-06-20 DIAGNOSIS — E7800 Pure hypercholesterolemia, unspecified: Secondary | ICD-10-CM | POA: Diagnosis not present

## 2020-06-20 DIAGNOSIS — G3101 Pick's disease: Secondary | ICD-10-CM | POA: Diagnosis not present

## 2020-06-20 DIAGNOSIS — I129 Hypertensive chronic kidney disease with stage 1 through stage 4 chronic kidney disease, or unspecified chronic kidney disease: Secondary | ICD-10-CM | POA: Diagnosis not present

## 2020-06-20 DIAGNOSIS — E1122 Type 2 diabetes mellitus with diabetic chronic kidney disease: Secondary | ICD-10-CM | POA: Diagnosis not present

## 2020-06-25 DIAGNOSIS — E785 Hyperlipidemia, unspecified: Secondary | ICD-10-CM | POA: Diagnosis not present

## 2020-06-25 DIAGNOSIS — E7800 Pure hypercholesterolemia, unspecified: Secondary | ICD-10-CM | POA: Diagnosis not present

## 2020-06-25 DIAGNOSIS — E1122 Type 2 diabetes mellitus with diabetic chronic kidney disease: Secondary | ICD-10-CM | POA: Diagnosis not present

## 2020-06-25 DIAGNOSIS — G3101 Pick's disease: Secondary | ICD-10-CM | POA: Diagnosis not present

## 2020-06-25 DIAGNOSIS — G309 Alzheimer's disease, unspecified: Secondary | ICD-10-CM | POA: Diagnosis not present

## 2020-06-25 DIAGNOSIS — N182 Chronic kidney disease, stage 2 (mild): Secondary | ICD-10-CM | POA: Diagnosis not present

## 2020-06-25 DIAGNOSIS — R69 Illness, unspecified: Secondary | ICD-10-CM | POA: Diagnosis not present

## 2020-06-25 DIAGNOSIS — I129 Hypertensive chronic kidney disease with stage 1 through stage 4 chronic kidney disease, or unspecified chronic kidney disease: Secondary | ICD-10-CM | POA: Diagnosis not present

## 2020-06-27 DIAGNOSIS — E1122 Type 2 diabetes mellitus with diabetic chronic kidney disease: Secondary | ICD-10-CM | POA: Diagnosis not present

## 2020-06-27 DIAGNOSIS — N182 Chronic kidney disease, stage 2 (mild): Secondary | ICD-10-CM | POA: Diagnosis not present

## 2020-06-27 DIAGNOSIS — G3101 Pick's disease: Secondary | ICD-10-CM | POA: Diagnosis not present

## 2020-06-27 DIAGNOSIS — R69 Illness, unspecified: Secondary | ICD-10-CM | POA: Diagnosis not present

## 2020-06-27 DIAGNOSIS — E785 Hyperlipidemia, unspecified: Secondary | ICD-10-CM | POA: Diagnosis not present

## 2020-06-27 DIAGNOSIS — G309 Alzheimer's disease, unspecified: Secondary | ICD-10-CM | POA: Diagnosis not present

## 2020-06-27 DIAGNOSIS — I129 Hypertensive chronic kidney disease with stage 1 through stage 4 chronic kidney disease, or unspecified chronic kidney disease: Secondary | ICD-10-CM | POA: Diagnosis not present

## 2020-06-27 DIAGNOSIS — E7800 Pure hypercholesterolemia, unspecified: Secondary | ICD-10-CM | POA: Diagnosis not present

## 2020-07-06 DIAGNOSIS — E785 Hyperlipidemia, unspecified: Secondary | ICD-10-CM | POA: Diagnosis not present

## 2020-07-06 DIAGNOSIS — R69 Illness, unspecified: Secondary | ICD-10-CM | POA: Diagnosis not present

## 2020-07-06 DIAGNOSIS — I129 Hypertensive chronic kidney disease with stage 1 through stage 4 chronic kidney disease, or unspecified chronic kidney disease: Secondary | ICD-10-CM | POA: Diagnosis not present

## 2020-07-06 DIAGNOSIS — G309 Alzheimer's disease, unspecified: Secondary | ICD-10-CM | POA: Diagnosis not present

## 2020-07-06 DIAGNOSIS — E7800 Pure hypercholesterolemia, unspecified: Secondary | ICD-10-CM | POA: Diagnosis not present

## 2020-07-06 DIAGNOSIS — G3101 Pick's disease: Secondary | ICD-10-CM | POA: Diagnosis not present

## 2020-07-06 DIAGNOSIS — N182 Chronic kidney disease, stage 2 (mild): Secondary | ICD-10-CM | POA: Diagnosis not present

## 2020-07-06 DIAGNOSIS — E1122 Type 2 diabetes mellitus with diabetic chronic kidney disease: Secondary | ICD-10-CM | POA: Diagnosis not present

## 2020-07-10 DIAGNOSIS — R69 Illness, unspecified: Secondary | ICD-10-CM | POA: Diagnosis not present

## 2020-07-11 DIAGNOSIS — G309 Alzheimer's disease, unspecified: Secondary | ICD-10-CM | POA: Diagnosis not present

## 2020-07-11 DIAGNOSIS — E785 Hyperlipidemia, unspecified: Secondary | ICD-10-CM | POA: Diagnosis not present

## 2020-07-11 DIAGNOSIS — E1122 Type 2 diabetes mellitus with diabetic chronic kidney disease: Secondary | ICD-10-CM | POA: Diagnosis not present

## 2020-07-11 DIAGNOSIS — I129 Hypertensive chronic kidney disease with stage 1 through stage 4 chronic kidney disease, or unspecified chronic kidney disease: Secondary | ICD-10-CM | POA: Diagnosis not present

## 2020-07-11 DIAGNOSIS — N182 Chronic kidney disease, stage 2 (mild): Secondary | ICD-10-CM | POA: Diagnosis not present

## 2020-07-11 DIAGNOSIS — G3101 Pick's disease: Secondary | ICD-10-CM | POA: Diagnosis not present

## 2020-07-11 DIAGNOSIS — E7800 Pure hypercholesterolemia, unspecified: Secondary | ICD-10-CM | POA: Diagnosis not present

## 2020-07-11 DIAGNOSIS — R69 Illness, unspecified: Secondary | ICD-10-CM | POA: Diagnosis not present

## 2020-07-16 DIAGNOSIS — G309 Alzheimer's disease, unspecified: Secondary | ICD-10-CM | POA: Diagnosis not present

## 2020-07-16 DIAGNOSIS — G3101 Pick's disease: Secondary | ICD-10-CM | POA: Diagnosis not present

## 2020-07-16 DIAGNOSIS — E1122 Type 2 diabetes mellitus with diabetic chronic kidney disease: Secondary | ICD-10-CM | POA: Diagnosis not present

## 2020-07-16 DIAGNOSIS — I129 Hypertensive chronic kidney disease with stage 1 through stage 4 chronic kidney disease, or unspecified chronic kidney disease: Secondary | ICD-10-CM | POA: Diagnosis not present

## 2020-07-16 DIAGNOSIS — E785 Hyperlipidemia, unspecified: Secondary | ICD-10-CM | POA: Diagnosis not present

## 2020-07-16 DIAGNOSIS — R69 Illness, unspecified: Secondary | ICD-10-CM | POA: Diagnosis not present

## 2020-07-16 DIAGNOSIS — E7800 Pure hypercholesterolemia, unspecified: Secondary | ICD-10-CM | POA: Diagnosis not present

## 2020-07-16 DIAGNOSIS — N182 Chronic kidney disease, stage 2 (mild): Secondary | ICD-10-CM | POA: Diagnosis not present

## 2020-07-17 DIAGNOSIS — H402211 Chronic angle-closure glaucoma, right eye, mild stage: Secondary | ICD-10-CM | POA: Diagnosis not present

## 2020-07-17 DIAGNOSIS — H26061 Combined forms of infantile and juvenile cataract, right eye: Secondary | ICD-10-CM | POA: Diagnosis not present

## 2020-07-17 DIAGNOSIS — Z961 Presence of intraocular lens: Secondary | ICD-10-CM | POA: Diagnosis not present

## 2020-07-17 DIAGNOSIS — H402223 Chronic angle-closure glaucoma, left eye, severe stage: Secondary | ICD-10-CM | POA: Diagnosis not present

## 2020-07-17 DIAGNOSIS — G3184 Mild cognitive impairment, so stated: Secondary | ICD-10-CM | POA: Diagnosis not present

## 2020-07-23 DIAGNOSIS — G3101 Pick's disease: Secondary | ICD-10-CM | POA: Diagnosis not present

## 2020-07-23 DIAGNOSIS — N182 Chronic kidney disease, stage 2 (mild): Secondary | ICD-10-CM | POA: Diagnosis not present

## 2020-07-23 DIAGNOSIS — R69 Illness, unspecified: Secondary | ICD-10-CM | POA: Diagnosis not present

## 2020-07-23 DIAGNOSIS — E785 Hyperlipidemia, unspecified: Secondary | ICD-10-CM | POA: Diagnosis not present

## 2020-07-23 DIAGNOSIS — I129 Hypertensive chronic kidney disease with stage 1 through stage 4 chronic kidney disease, or unspecified chronic kidney disease: Secondary | ICD-10-CM | POA: Diagnosis not present

## 2020-07-23 DIAGNOSIS — G309 Alzheimer's disease, unspecified: Secondary | ICD-10-CM | POA: Diagnosis not present

## 2020-07-23 DIAGNOSIS — E1122 Type 2 diabetes mellitus with diabetic chronic kidney disease: Secondary | ICD-10-CM | POA: Diagnosis not present

## 2020-07-23 DIAGNOSIS — E7800 Pure hypercholesterolemia, unspecified: Secondary | ICD-10-CM | POA: Diagnosis not present

## 2020-07-31 DIAGNOSIS — I10 Essential (primary) hypertension: Secondary | ICD-10-CM | POA: Diagnosis not present

## 2020-07-31 DIAGNOSIS — Z0000 Encounter for general adult medical examination without abnormal findings: Secondary | ICD-10-CM | POA: Diagnosis not present

## 2020-07-31 DIAGNOSIS — Z1389 Encounter for screening for other disorder: Secondary | ICD-10-CM | POA: Diagnosis not present

## 2020-07-31 DIAGNOSIS — R296 Repeated falls: Secondary | ICD-10-CM | POA: Diagnosis not present

## 2020-07-31 DIAGNOSIS — G3101 Pick's disease: Secondary | ICD-10-CM | POA: Diagnosis not present

## 2020-07-31 DIAGNOSIS — E1142 Type 2 diabetes mellitus with diabetic polyneuropathy: Secondary | ICD-10-CM | POA: Diagnosis not present

## 2020-07-31 DIAGNOSIS — E785 Hyperlipidemia, unspecified: Secondary | ICD-10-CM | POA: Diagnosis not present

## 2020-07-31 DIAGNOSIS — R509 Fever, unspecified: Secondary | ICD-10-CM | POA: Diagnosis not present

## 2020-07-31 DIAGNOSIS — E1122 Type 2 diabetes mellitus with diabetic chronic kidney disease: Secondary | ICD-10-CM | POA: Diagnosis not present

## 2020-07-31 DIAGNOSIS — G3109 Other frontotemporal dementia: Secondary | ICD-10-CM | POA: Diagnosis not present

## 2020-08-01 DIAGNOSIS — E785 Hyperlipidemia, unspecified: Secondary | ICD-10-CM | POA: Diagnosis not present

## 2020-08-01 DIAGNOSIS — G3101 Pick's disease: Secondary | ICD-10-CM | POA: Diagnosis not present

## 2020-08-01 DIAGNOSIS — G309 Alzheimer's disease, unspecified: Secondary | ICD-10-CM | POA: Diagnosis not present

## 2020-08-01 DIAGNOSIS — I129 Hypertensive chronic kidney disease with stage 1 through stage 4 chronic kidney disease, or unspecified chronic kidney disease: Secondary | ICD-10-CM | POA: Diagnosis not present

## 2020-08-01 DIAGNOSIS — R69 Illness, unspecified: Secondary | ICD-10-CM | POA: Diagnosis not present

## 2020-08-01 DIAGNOSIS — E1122 Type 2 diabetes mellitus with diabetic chronic kidney disease: Secondary | ICD-10-CM | POA: Diagnosis not present

## 2020-08-01 DIAGNOSIS — N182 Chronic kidney disease, stage 2 (mild): Secondary | ICD-10-CM | POA: Diagnosis not present

## 2020-08-01 DIAGNOSIS — E7800 Pure hypercholesterolemia, unspecified: Secondary | ICD-10-CM | POA: Diagnosis not present

## 2020-08-07 ENCOUNTER — Emergency Department (HOSPITAL_COMMUNITY)
Admission: EM | Admit: 2020-08-07 | Discharge: 2020-08-07 | Disposition: A | Discharge status: Home / Self Care | Payer: Medicare HMO | Attending: Emergency Medicine | Admitting: Emergency Medicine

## 2020-08-07 ENCOUNTER — Telehealth: Payer: Self-pay

## 2020-08-07 ENCOUNTER — Emergency Department (HOSPITAL_COMMUNITY): Payer: Medicare HMO

## 2020-08-07 ENCOUNTER — Other Ambulatory Visit: Payer: Self-pay

## 2020-08-07 DIAGNOSIS — R404 Transient alteration of awareness: Secondary | ICD-10-CM | POA: Diagnosis not present

## 2020-08-07 DIAGNOSIS — N390 Urinary tract infection, site not specified: Secondary | ICD-10-CM

## 2020-08-07 DIAGNOSIS — Z7984 Long term (current) use of oral hypoglycemic drugs: Secondary | ICD-10-CM | POA: Insufficient documentation

## 2020-08-07 DIAGNOSIS — Z87891 Personal history of nicotine dependence: Secondary | ICD-10-CM | POA: Diagnosis not present

## 2020-08-07 DIAGNOSIS — F0280 Dementia in other diseases classified elsewhere without behavioral disturbance: Secondary | ICD-10-CM

## 2020-08-07 DIAGNOSIS — Z79899 Other long term (current) drug therapy: Secondary | ICD-10-CM | POA: Diagnosis not present

## 2020-08-07 DIAGNOSIS — R509 Fever, unspecified: Secondary | ICD-10-CM | POA: Diagnosis present

## 2020-08-07 DIAGNOSIS — E119 Type 2 diabetes mellitus without complications: Secondary | ICD-10-CM | POA: Insufficient documentation

## 2020-08-07 DIAGNOSIS — G309 Alzheimer's disease, unspecified: Secondary | ICD-10-CM | POA: Diagnosis not present

## 2020-08-07 DIAGNOSIS — E876 Hypokalemia: Secondary | ICD-10-CM | POA: Diagnosis not present

## 2020-08-07 DIAGNOSIS — J984 Other disorders of lung: Secondary | ICD-10-CM | POA: Diagnosis not present

## 2020-08-07 DIAGNOSIS — R0902 Hypoxemia: Secondary | ICD-10-CM | POA: Diagnosis not present

## 2020-08-07 DIAGNOSIS — Z20822 Contact with and (suspected) exposure to covid-19: Secondary | ICD-10-CM | POA: Insufficient documentation

## 2020-08-07 DIAGNOSIS — R9082 White matter disease, unspecified: Secondary | ICD-10-CM | POA: Diagnosis not present

## 2020-08-07 DIAGNOSIS — G9389 Other specified disorders of brain: Secondary | ICD-10-CM | POA: Diagnosis not present

## 2020-08-07 DIAGNOSIS — Z8673 Personal history of transient ischemic attack (TIA), and cerebral infarction without residual deficits: Secondary | ICD-10-CM | POA: Insufficient documentation

## 2020-08-07 DIAGNOSIS — I10 Essential (primary) hypertension: Secondary | ICD-10-CM | POA: Insufficient documentation

## 2020-08-07 DIAGNOSIS — I6389 Other cerebral infarction: Secondary | ICD-10-CM | POA: Diagnosis not present

## 2020-08-07 DIAGNOSIS — R0689 Other abnormalities of breathing: Secondary | ICD-10-CM | POA: Diagnosis not present

## 2020-08-07 DIAGNOSIS — G3109 Other frontotemporal dementia: Secondary | ICD-10-CM

## 2020-08-07 LAB — URINALYSIS, ROUTINE W REFLEX MICROSCOPIC
Bilirubin Urine: NEGATIVE
Glucose, UA: NEGATIVE mg/dL
Hgb urine dipstick: NEGATIVE
Ketones, ur: 20 mg/dL — AB
Nitrite: POSITIVE — AB
Protein, ur: 100 mg/dL — AB
Specific Gravity, Urine: 1.015 (ref 1.005–1.030)
WBC, UA: >50 WBC/hpf — ABNORMAL HIGH (ref 0–5)
pH: 6 (ref 5.0–8.0)

## 2020-08-07 LAB — CBC WITH DIFFERENTIAL/PLATELET
Abs Immature Granulocytes: 0.03 10*3/uL (ref 0.00–0.07)
Basophils Absolute: 0 10*3/uL (ref 0.0–0.1)
Basophils Relative: 0 %
Eosinophils Absolute: 0 10*3/uL (ref 0.0–0.5)
Eosinophils Relative: 0 %
HCT: 37.9 % — ABNORMAL LOW (ref 39.0–52.0)
Hemoglobin: 11.9 g/dL — ABNORMAL LOW (ref 13.0–17.0)
Immature Granulocytes: 0 %
Lymphocytes Relative: 7 %
Lymphs Abs: 0.6 10*3/uL — ABNORMAL LOW (ref 0.7–4.0)
MCH: 28.4 pg (ref 26.0–34.0)
MCHC: 31.4 g/dL (ref 30.0–36.0)
MCV: 90.5 fL (ref 80.0–100.0)
Monocytes Absolute: 0.6 10*3/uL (ref 0.1–1.0)
Monocytes Relative: 8 %
Neutro Abs: 7.1 10*3/uL (ref 1.7–7.7)
Neutrophils Relative %: 85 %
Platelets: 194 10*3/uL (ref 150–400)
RBC: 4.19 MIL/uL — ABNORMAL LOW (ref 4.22–5.81)
RDW: 14.2 % (ref 11.5–15.5)
WBC: 8.4 10*3/uL (ref 4.0–10.5)
nRBC: 0 % (ref 0.0–0.2)

## 2020-08-07 LAB — COMPREHENSIVE METABOLIC PANEL
ALT: 11 U/L (ref 0–44)
AST: 10 U/L — ABNORMAL LOW (ref 15–41)
Albumin: 3.4 g/dL — ABNORMAL LOW (ref 3.5–5.0)
Alkaline Phosphatase: 73 U/L (ref 38–126)
Anion gap: 12 (ref 5–15)
BUN: 14 mg/dL (ref 8–23)
CO2: 26 mmol/L (ref 22–32)
Calcium: 8.3 mg/dL — ABNORMAL LOW (ref 8.9–10.3)
Chloride: 105 mmol/L (ref 98–111)
Creatinine, Ser: 0.95 mg/dL (ref 0.61–1.24)
GFR, Estimated: >60 mL/min (ref >60)
Glucose, Bld: 125 mg/dL — ABNORMAL HIGH (ref 70–99)
Potassium: 2.9 mmol/L — ABNORMAL LOW (ref 3.5–5.1)
Sodium: 143 mmol/L (ref 135–145)
Total Bilirubin: 1.2 mg/dL (ref 0.3–1.2)
Total Protein: 6.7 g/dL (ref 6.5–8.1)

## 2020-08-07 LAB — MAGNESIUM: Magnesium: 2 mg/dL (ref 1.7–2.4)

## 2020-08-07 LAB — LACTIC ACID, PLASMA: Lactic Acid, Venous: 0.7 mmol/L (ref 0.5–1.9)

## 2020-08-07 LAB — PROTIME-INR
INR: 1 (ref 0.8–1.2)
Prothrombin Time: 12.9 seconds (ref 11.4–15.2)

## 2020-08-07 LAB — RESPIRATORY PANEL BY RT PCR (FLU A&B, COVID)
Influenza A by PCR: NEGATIVE
Influenza B by PCR: NEGATIVE
SARS Coronavirus 2 by RT PCR: NEGATIVE

## 2020-08-07 LAB — APTT: aPTT: 27 seconds (ref 24–36)

## 2020-08-07 LAB — CBG MONITORING, ED: Glucose-Capillary: 117 mg/dL — ABNORMAL HIGH (ref 70–99)

## 2020-08-07 MED ORDER — POTASSIUM CHLORIDE CRYS ER 20 MEQ PO TBCR
40.0000 meq | EXTENDED_RELEASE_TABLET | Freq: Once | ORAL | Status: AC
  Administered 2020-08-07: 40 meq via ORAL
  Filled 2020-08-07: qty 2

## 2020-08-07 MED ORDER — CEPHALEXIN 500 MG PO CAPS: 500.0000 mg | ORAL_CAPSULE | Freq: Two times a day (BID) | ORAL | 0 refills | Status: AC

## 2020-08-07 MED ORDER — SODIUM CHLORIDE 0.9 % IV SOLN
1.0000 g | Freq: Once | INTRAVENOUS | Status: AC
  Administered 2020-08-07: 1 g via INTRAVENOUS
  Filled 2020-08-07: qty 10

## 2020-08-07 MED ORDER — POTASSIUM CHLORIDE 10 % PO SOLN: 20.0000 meq | Freq: Two times a day (BID) | ORAL | 0 refills | Status: DC

## 2020-08-07 MED ORDER — SODIUM CHLORIDE 0.9 % IV BOLUS
1000.0000 mL | Freq: Once | INTRAVENOUS | Status: AC
  Administered 2020-08-07: 1000 mL via INTRAVENOUS

## 2020-08-07 NOTE — ED Triage Notes (Signed)
Sent from home via EMS for decreased LOC. Pt dx with UTI yesterday but hasnt been given any ABX as of yet. Wife and daughter are caregivers at home along with home health.

## 2020-08-07 NOTE — ED Notes (Signed)
Pt to CT via stretcher

## 2020-08-07 NOTE — ED Provider Notes (Signed)
[facility]-EMERGENCY DEPT Provider Note   CSN: [id number] Arrival date & time: 08/07/20  1144  LEVEL 5 CAVEAT - DEMENTIA  History Chief Complaint  Patient presents with  . Urinary Tract Infection    Daniel Morrow is a 79 y.o. male.  HPI  79 year old male presents from home via EMS.  History is obtained via the wife over the phone.  Patient chronically has dementia and cannot speak normally.  He has been falling more over the past couple weeks.  Thought he had a UTI last week but no meds were given as they were waiting on the culture which came back positive yesterday.  Today it seemed like he was breathing a little faster when the home health nurse checked on him.  His eyes have been staring to the side as well.  Has had low-grade fevers, highest was 100.4 2 nights ago.  Past Medical History:  Diagnosis Date  . Aphagia   . Arthritis    "knees" (09/14/2018)  . Blind left eye   . Chronic bronchitis (HCC)   . Dementia (HCC)   . Depression   . GERD (gastroesophageal reflux disease)   . Glaucoma   . History of kidney stones   . Hyperlipidemia   . Hypertension   . Pneumonia    "maybe twice" (09/14/2018)  . Recurrent UTI (urinary tract infection)   . Stroke [facility]) 11/2011   "didn't effect him at the time" (09/14/2018)  . Type II diabetes mellitus [facility])     Patient Active Problem List   Diagnosis Date Noted  . Hypoxia 10/04/2019  . Glaucoma 10/04/2019  . Foreign body in esophagus   . Esophageal stenosis   . Hiatal hernia   . Chronic anticoagulation   . Esophageal dysphagia   . Esophageal stricture   . Shortness of breath at rest 09/12/2018  . Shortness of breath 09/12/2018  . Vision, loss, sudden 06/03/2016  . CRAO (central retinal artery occlusion) 06/03/2016  . Alzheimer's disease (HCC) 01/16/2016  . Epididymo-orchitis 12/02/2013  . Orchitis and epididymitis 12/02/2013  . Mild cognitive impairment, so stated 02/17/2013  . CVA (cerebral  infarction) 02/17/2013  . 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 Hypertension 04/03/2012  . Type II diabetes mellitus (HCC) 04/03/2012  . Hyperlipidemia     Past Surgical History:  Procedure Laterality Date  . ABDOMINAL HERNIA REPAIR    . CATARACT EXTRACTION W/ INTRAOCULAR LENS  IMPLANT, BILATERAL Bilateral   . COLONOSCOPY    . ESOPHAGOGASTRODUODENOSCOPY N/A 01/11/2019   Procedure: ESOPHAGOGASTRODUODENOSCOPY (EGD);  Surgeon: Meryl Dare, MD;  Location: [facility] ENDOSCOPY;  Service: Endoscopy;  Laterality: N/A;  . ESOPHAGOGASTRODUODENOSCOPY Left 10/04/2019   Procedure: ESOPHAGOGASTRODUODENOSCOPY (EGD);  Surgeon: Shellia Cleverly, DO;  Location: [facility] ENDOSCOPY;  Service: Gastroenterology;  Laterality: Left;  . ESOPHAGOGASTRODUODENOSCOPY (EGD) WITH ESOPHAGEAL DILATION  X 2  . FOREIGN BODY REMOVAL  01/11/2019  . FRACTURE SURGERY    . HERNIA REPAIR    . IMPACTION REMOVAL  10/04/2019   Procedure: IMPACTION REMOVAL;  Surgeon: Shellia Cleverly, DO;  Location: [facility] ENDOSCOPY;  Service: Gastroenterology;;  . PATELLA FRACTURE SURGERY Left   . POSTERIOR CERVICAL FUSION/FORAMINOTOMY         Family History  Problem Relation Age of Onset  . Polycythemia Mother   . Lung cancer Father   . Other Brother        brain  tumor  . Diabetes Daughter   . Colon cancer Neg Hx   . Esophageal cancer Neg Hx   . Rectal cancer Neg Hx   . Stomach cancer Neg Hx     Social History   Tobacco Use  . Smoking status: Former Smoker    Packs/day: 1.00    Years: 20.00    Pack years: 20.00    Types: Cigarettes    Quit date: 1980    Years since quitting: 41.9  . Smokeless tobacco: Former Neurosurgeon    Quit date: 1985  Vaping Use  . Vaping Use: Never used  Substance Use Topics  . Alcohol use: No  . Drug use: Never    Home Medications Prior to Admission medications   Medication Sig  Start Date End Date Taking? Authorizing Provider  acetaminophen (TYLENOL) 325 MG tablet Take 650 mg by mouth every 6 (six) hours as needed for mild pain, fever or headache.   Yes [provider]  amLODipine (NORVASC) 10 MG tablet Take 1 tablet (10 mg total) by mouth daily. 10/05/19 10/04/20 Yes Adhikari, Willia Craze, MD  benazepril (LOTENSIN) 20 MG tablet Take 20 mg by mouth daily.    Yes [provider]  brimonidine (ALPHAGAN) 0.2 % ophthalmic solution Place 1 drop into the right eye 2 (two) times daily.  02/25/18  Yes [provider]  colesevelam (WELCHOL) 625 MG tablet Take 1,875 mg by mouth 2 (two) times daily with a meal.  12/22/18  Yes [provider]  dipyridamole-aspirin (AGGRENOX) 200-25 MG 12hr capsule Take 1 capsule by mouth 2 (two) times daily.   Yes [provider]  glimepiride (AMARYL) 4 MG tablet Take 4 mg by mouth daily.  06/12/16  Yes [provider]  latanoprost (XALATAN) 0.005 % ophthalmic solution Place 1 drop into the right eye at bedtime.  06/02/16  Yes [provider]  memantine (NAMENDA) 10 MG tablet Take 1 tablet (10 mg total) by mouth 2 (two) times daily. 08/24/18  Yes Sethi, Jason Fila, MD  OZEMPIC, 1 MG/DOSE, 4 MG/3ML SOPN Inject 1 mg into the skin once a week. Mondays 07/30/20  Yes [provider]  pantoprazole (PROTONIX) 20 MG tablet Take 20 mg by mouth daily.   Yes [provider]  sertraline (ZOLOFT) 100 MG tablet Take 1 tablet (100 mg total) by mouth daily. Patient taking differently: Take 150 mg by mouth daily.  03/02/17  Yes Micki Riley, MD  timolol (TIMOPTIC) 0.5 % ophthalmic solution Place 1 drop into the right eye 2 (two) times daily.   Yes [provider]  traZODone (DESYREL) 100 MG tablet Take 100 mg by mouth at bedtime. 07/23/20  Yes [provider]  vitamin B-12 (CYANOCOBALAMIN) 1000 MCG tablet Take 1,000 mcg by mouth daily.   Yes [provider]  cephALEXin (KEFLEX)  500 MG capsule Take 1 capsule (500 mg total) by mouth 2 (two) times daily for 7 days. 08/07/20 08/14/20  Pricilla Loveless, MD  Potassium Chloride 10 % SOLN Take 20 mEq by mouth in the morning and at bedtime for 5 days. 08/07/20 08/12/20  Pricilla Loveless, MD  sucralfate (CARAFATE) 1 GM/10ML suspension Take 10 mLs (1 g total) by mouth 4 (four) times daily. Patient not taking: Reported on 11/15/2019 10/04/19 04/14/20  Shellia Cleverly, DO    Allergies    Patient has no known allergies.  Review of Systems   Review of Systems  Unable to perform ROS: Dementia    Physical Exam Updated  Vital Signs BP (!) 158/83   Pulse 82   Temp 98.5 F (36.9 C) (Rectal)   Resp 18   SpO2 95%   Physical Exam Vitals and nursing note reviewed.  Constitutional:      Appearance: He is well-developed.  HENT:     Head: Normocephalic and atraumatic.     Right Ear: External ear normal.     Left Ear: External ear normal.     Nose: Nose normal.  Eyes:     General:        Right eye: No discharge.        Left eye: No discharge.  Cardiovascular:     Rate and Rhythm: Normal rate and regular rhythm.     Heart sounds: Normal heart sounds.  Pulmonary:     Effort: Pulmonary effort is normal.     Breath sounds: Normal breath sounds.  Abdominal:     General: There is no distension.     Palpations: Abdomen is soft.     Tenderness: There is no abdominal tenderness.  Musculoskeletal:     Cervical back: Neck supple.  Skin:    General: Skin is warm and dry.  Neurological:     Mental Status: He is alert.     Comments: Awake, alert, but does not talk to me. He does grip my hands bilaterally when asked. Does not lift legs. When he does try to speak I cannot understand what he is saying  Psychiatric:        Mood and Affect: Mood is not anxious.     ED Results / Procedures / Treatments   Labs (all labs ordered are listed, but only abnormal results are displayed) Labs Reviewed  COMPREHENSIVE METABOLIC PANEL -  Abnormal; Notable for the following components:      Result Value   Potassium 2.9 (*)    Glucose, Bld 125 (*)    Calcium 8.3 (*)    Albumin 3.4 (*)    AST 10 (*)    All other components within normal limits  CBC WITH DIFFERENTIAL/PLATELET - Abnormal; Notable for the following components:   RBC 4.19 (*)    Hemoglobin 11.9 (*)    HCT 37.9 (*)    Lymphs Abs 0.6 (*)    All other components within normal limits  URINALYSIS, ROUTINE W REFLEX MICROSCOPIC - Abnormal; Notable for the following components:   APPearance HAZY (*)    Ketones, ur 20 (*)    Protein, ur 100 (*)    Nitrite POSITIVE (*)    Leukocytes,Ua SMALL (*)    WBC, UA >50 (*)    Bacteria, UA MANY (*)    All other components within normal limits  CBG MONITORING, ED - Abnormal; Notable for the following components:   Glucose-Capillary 117 (*)    All other components within normal limits  RESPIRATORY PANEL BY RT PCR (FLU A&B, COVID)  URINE CULTURE  CULTURE, BLOOD (ROUTINE X 2)  CULTURE, BLOOD (ROUTINE X 2)  LACTIC ACID, PLASMA  PROTIME-INR  APTT  MAGNESIUM    EKG EKG Interpretation  Date/Time:  Tuesday August 07 2020 12:41:44 EST Ventricular Rate:  85 PR Interval:    QRS Duration: 100 QT Interval:  420 QTC Calculation: 500 R Axis:   77 Text Interpretation: Sinus rhythm Anteroseptal infarct, old no significant change since 2019 Confirmed by Pricilla Loveless [phone]) on 08/07/2020 12:57:57 PM   Radiology CT Head Wo Contrast  Result Date: 08/07/2020 CLINICAL DATA:  Delirium EXAM: CT HEAD WITHOUT  CONTRAST TECHNIQUE: Contiguous axial images were obtained from the base of the skull through the vertex without intravenous contrast. COMPARISON:  July 2020 FINDINGS: Brain: There is no acute intracranial hemorrhage, mass effect, or edema. Gray-white differentiation is preserved. There is no extra-axial fluid collection. Prominence of the ventricles and sulci reflects stable generalized parenchymal volume loss. Patchy and  confluent areas of hypoattenuation in the supratentorial white matter are nonspecific but may reflect moderate chronic microvascular ischemic changes. Small chronic right cerebellar infarct. Vascular: There is atherosclerotic calcification at the skull base. Skull: Calvarium is unremarkable. Sinuses/Orbits: No acute finding. Other: None. IMPRESSION: No acute intracranial abnormality. Stable chronic findings detailed above. Electronically Signed   By: Guadlupe SpanishPraneil  Patel M.D.   On: 08/07/2020 15:03   DG Chest Port 1 View  Result Date: 08/07/2020 CLINICAL DATA:  Possible sepsis.  Evaluate for pneumonia. EXAM: PORTABLE CHEST 1 VIEW COMPARISON:  10/23/2019 FINDINGS: Numerous leads and wires project over the chest. Patient rotated right. Midline trachea. Normal heart size. Apparent right paratracheal soft tissue fullness has been similar including back to 12/21/2017, favored to be related to AP portable technique and great vessels. Trace left costophrenic angle blunting is likely due to pleural thickening. Mild bibasilar scarring. No lobar consolidation. IMPRESSION: No acute cardiopulmonary disease. Electronically Signed   By: Jeronimo GreavesKyle  Talbot M.D.   On: 08/07/2020 13:26    Procedures Procedures (including critical care time)  Medications Ordered in ED Medications  sodium chloride 0.9 % bolus 1,000 mL (0 mLs Intravenous Stopped 08/07/20 1521)  cefTRIAXone (ROCEPHIN) 1 g in sodium chloride 0.9 % 100 mL IVPB (0 g Intravenous Stopped 08/07/20 1521)  potassium chloride SA (KLOR-CON) CR tablet 40 mEq (40 mEq Oral Given 08/07/20 1508)    ED Course  I have reviewed the triage vital signs and the nursing notes.  Pertinent labs & imaging results that were available during my care of the patient were reviewed by me and considered in my medical decision making (see chart for details).    MDM Rules/Calculators/A&P                          Wife is now at the bedside and she feels the patient is doing much better  after IV fluids.  His lab work is unremarkable save for hypokalemia.  He was given oral potassium.  Tolerated this without significant difficulty though she thinks he might do better with liquid.  Otherwise his mental status seems baseline.  CT head and labs and x-ray reviewed and are benign besides acute UTI.  Lactate is negative.  I do not think he needs IV antibiotics besides this first dose or an admission.  We did discuss return precautions.  I called his doctor's office and was able to see the urine culture report and it seems to be soft to cephalosporin so we will give Keflex. Final Clinical Impression(s) / ED Diagnoses Final diagnoses:  Acute urinary tract infection  Hypokalemia    Rx / DC Orders ED Discharge Orders         Ordered    cephALEXin (KEFLEX) 500 MG capsule  2 times daily        08/07/20 1555    Potassium Chloride 10 % SOLN  2 times daily        08/07/20 1556           Pricilla LovelessGoldston, Jarae Nemmers, MD 08/07/20 1626

## 2020-08-07 NOTE — ED Notes (Deleted)
Pt to CT and informed that IV wouldn't flush. Pt brought back to room and attempted IV access but unsuccessful. Will consult IV team

## 2020-08-07 NOTE — Discharge Instructions (Signed)
If he develops fever, vomiting, new or worsening mental status changes, or any other new/concerning symptoms, then return to the ER or call 911

## 2020-08-08 ENCOUNTER — Telehealth (HOSPITAL_BASED_OUTPATIENT_CLINIC_OR_DEPARTMENT_OTHER): Payer: Self-pay | Admitting: Emergency Medicine

## 2020-08-08 ENCOUNTER — Telehealth: Payer: Self-pay | Admitting: *Deleted

## 2020-08-08 LAB — BLOOD CULTURE ID PANEL (REFLEXED) - BCID2

## 2020-08-08 NOTE — Telephone Encounter (Signed)
Pharmacy called related to Rx: potassium chloride directions/quantity .Marland KitchenMarland KitchenEDCM clarified with EDP to change Rx to: increase quantity.

## 2020-08-09 LAB — URINE CULTURE: Culture: >=100000 — AB

## 2020-08-10 ENCOUNTER — Telehealth: Payer: Self-pay

## 2020-08-10 NOTE — Telephone Encounter (Signed)
Post ED Visit - Positive Culture Follow-up  Culture report reviewed by antimicrobial stewardship pharmacist: [facility] [x]  , Pharm.D. []  Enzo Bi, Pharm.D., BCPS AQ-ID []  , Pharm.D., BCPS []  Celedonio Miyamoto, Pharm.D., BCPS []  [place], Garvin Fila.D., BCPS, AAHIVP []  , Pharm.D., BCPS, AAHIVP []  Georgina Pillion, PharmD, BCPS []  , PharmD, BCPS []  [place], PharmD, BCPS []  [address], PharmD []  , PharmD, BCPS []  Estella Husk, PharmD  Pharmacy Team []  Lysle Pearl, PharmD []  , PharmD []  Phillips Climes, PharmD []  , Rph []  Agapito Games) , PharmD []  Verlan Friends, PharmD []  , PharmD []  Mervyn Gay, PharmD []  , PharmD []  Vinnie Level, PharmD []  [facility], PharmD []  , PharmD []  Len Childs, PharmD   Positive  Urine culture Treated with Cephalexin, organism sensitive to the same and no further patient follow-up is required at this time.  08/10/2020, 9:28 AM

## 2020-08-11 LAB — CULTURE, BLOOD (ROUTINE X 2): Special Requests: ADEQUATE

## 2020-08-12 LAB — CULTURE, BLOOD (ROUTINE X 2): Culture: NO GROWTH

## 2020-08-13 DIAGNOSIS — E785 Hyperlipidemia, unspecified: Secondary | ICD-10-CM | POA: Diagnosis not present

## 2020-08-13 DIAGNOSIS — G3101 Pick's disease: Secondary | ICD-10-CM | POA: Diagnosis not present

## 2020-08-13 DIAGNOSIS — G309 Alzheimer's disease, unspecified: Secondary | ICD-10-CM | POA: Diagnosis not present

## 2020-08-13 DIAGNOSIS — E1122 Type 2 diabetes mellitus with diabetic chronic kidney disease: Secondary | ICD-10-CM | POA: Diagnosis not present

## 2020-08-13 DIAGNOSIS — R69 Illness, unspecified: Secondary | ICD-10-CM | POA: Diagnosis not present

## 2020-08-13 DIAGNOSIS — I129 Hypertensive chronic kidney disease with stage 1 through stage 4 chronic kidney disease, or unspecified chronic kidney disease: Secondary | ICD-10-CM | POA: Diagnosis not present

## 2020-08-13 DIAGNOSIS — N182 Chronic kidney disease, stage 2 (mild): Secondary | ICD-10-CM | POA: Diagnosis not present

## 2020-08-13 DIAGNOSIS — E7800 Pure hypercholesterolemia, unspecified: Secondary | ICD-10-CM | POA: Diagnosis not present

## 2020-08-20 DIAGNOSIS — N182 Chronic kidney disease, stage 2 (mild): Secondary | ICD-10-CM | POA: Diagnosis not present

## 2020-08-20 DIAGNOSIS — E785 Hyperlipidemia, unspecified: Secondary | ICD-10-CM | POA: Diagnosis not present

## 2020-08-20 DIAGNOSIS — I129 Hypertensive chronic kidney disease with stage 1 through stage 4 chronic kidney disease, or unspecified chronic kidney disease: Secondary | ICD-10-CM | POA: Diagnosis not present

## 2020-08-20 DIAGNOSIS — G3101 Pick's disease: Secondary | ICD-10-CM | POA: Diagnosis not present

## 2020-08-20 DIAGNOSIS — E1122 Type 2 diabetes mellitus with diabetic chronic kidney disease: Secondary | ICD-10-CM | POA: Diagnosis not present

## 2020-08-20 DIAGNOSIS — E7800 Pure hypercholesterolemia, unspecified: Secondary | ICD-10-CM | POA: Diagnosis not present

## 2020-08-20 DIAGNOSIS — R69 Illness, unspecified: Secondary | ICD-10-CM | POA: Diagnosis not present

## 2020-08-20 DIAGNOSIS — G309 Alzheimer's disease, unspecified: Secondary | ICD-10-CM | POA: Diagnosis not present

## 2020-08-22 ENCOUNTER — Emergency Department (HOSPITAL_COMMUNITY): Payer: Medicare HMO

## 2020-08-22 ENCOUNTER — Inpatient Hospital Stay (HOSPITAL_COMMUNITY): Payer: Medicare HMO

## 2020-08-22 ENCOUNTER — Inpatient Hospital Stay (HOSPITAL_COMMUNITY)
Admission: EM | Admit: 2020-08-22 | Discharge: 2020-09-06 | DRG: 177 | Disposition: A | Discharge status: Skilled Nursing Facility | Payer: Medicare HMO | Attending: Internal Medicine | Admitting: Internal Medicine

## 2020-08-22 DIAGNOSIS — M79621 Pain in right upper arm: Secondary | ICD-10-CM | POA: Diagnosis not present

## 2020-08-22 DIAGNOSIS — Z515 Encounter for palliative care: Secondary | ICD-10-CM

## 2020-08-22 DIAGNOSIS — Z801 Family history of malignant neoplasm of trachea, bronchus and lung: Secondary | ICD-10-CM | POA: Diagnosis not present

## 2020-08-22 DIAGNOSIS — J939 Pneumothorax, unspecified: Secondary | ICD-10-CM | POA: Diagnosis not present

## 2020-08-22 DIAGNOSIS — Y92230 Patient room in hospital as the place of occurrence of the external cause: Secondary | ICD-10-CM | POA: Diagnosis not present

## 2020-08-22 DIAGNOSIS — E119 Type 2 diabetes mellitus without complications: Secondary | ICD-10-CM | POA: Diagnosis not present

## 2020-08-22 DIAGNOSIS — Z87891 Personal history of nicotine dependence: Secondary | ICD-10-CM

## 2020-08-22 DIAGNOSIS — N390 Urinary tract infection, site not specified: Secondary | ICD-10-CM

## 2020-08-22 DIAGNOSIS — Z20822 Contact with and (suspected) exposure to covid-19: Secondary | ICD-10-CM | POA: Diagnosis present

## 2020-08-22 DIAGNOSIS — N3000 Acute cystitis without hematuria: Secondary | ICD-10-CM | POA: Diagnosis not present

## 2020-08-22 DIAGNOSIS — M25511 Pain in right shoulder: Secondary | ICD-10-CM | POA: Diagnosis not present

## 2020-08-22 DIAGNOSIS — I959 Hypotension, unspecified: Secondary | ICD-10-CM | POA: Diagnosis not present

## 2020-08-22 DIAGNOSIS — E785 Hyperlipidemia, unspecified: Secondary | ICD-10-CM | POA: Diagnosis present

## 2020-08-22 DIAGNOSIS — K449 Diaphragmatic hernia without obstruction or gangrene: Secondary | ICD-10-CM | POA: Diagnosis present

## 2020-08-22 DIAGNOSIS — W06XXXA Fall from bed, initial encounter: Secondary | ICD-10-CM | POA: Diagnosis not present

## 2020-08-22 DIAGNOSIS — F0280 Dementia in other diseases classified elsewhere without behavioral disturbance: Secondary | ICD-10-CM

## 2020-08-22 DIAGNOSIS — Z8673 Personal history of transient ischemic attack (TIA), and cerebral infarction without residual deficits: Secondary | ICD-10-CM

## 2020-08-22 DIAGNOSIS — I10 Essential (primary) hypertension: Secondary | ICD-10-CM | POA: Diagnosis not present

## 2020-08-22 DIAGNOSIS — G309 Alzheimer's disease, unspecified: Secondary | ICD-10-CM | POA: Diagnosis present

## 2020-08-22 DIAGNOSIS — F0281 Dementia in other diseases classified elsewhere with behavioral disturbance: Secondary | ICD-10-CM | POA: Diagnosis present

## 2020-08-22 DIAGNOSIS — F419 Anxiety disorder, unspecified: Secondary | ICD-10-CM | POA: Diagnosis present

## 2020-08-22 DIAGNOSIS — Z7189 Other specified counseling: Secondary | ICD-10-CM | POA: Diagnosis not present

## 2020-08-22 DIAGNOSIS — J9811 Atelectasis: Secondary | ICD-10-CM | POA: Diagnosis not present

## 2020-08-22 DIAGNOSIS — G20 Parkinson's disease: Secondary | ICD-10-CM | POA: Diagnosis present

## 2020-08-22 DIAGNOSIS — H409 Unspecified glaucoma: Secondary | ICD-10-CM | POA: Diagnosis present

## 2020-08-22 DIAGNOSIS — Z9189 Other specified personal risk factors, not elsewhere classified: Secondary | ICD-10-CM | POA: Diagnosis not present

## 2020-08-22 DIAGNOSIS — E1165 Type 2 diabetes mellitus with hyperglycemia: Secondary | ICD-10-CM | POA: Diagnosis not present

## 2020-08-22 DIAGNOSIS — Z833 Family history of diabetes mellitus: Secondary | ICD-10-CM

## 2020-08-22 DIAGNOSIS — G9341 Metabolic encephalopathy: Secondary | ICD-10-CM | POA: Diagnosis present

## 2020-08-22 DIAGNOSIS — S0181XA Laceration without foreign body of other part of head, initial encounter: Secondary | ICD-10-CM | POA: Diagnosis not present

## 2020-08-22 DIAGNOSIS — Z79899 Other long term (current) drug therapy: Secondary | ICD-10-CM

## 2020-08-22 DIAGNOSIS — M2550 Pain in unspecified joint: Secondary | ICD-10-CM | POA: Diagnosis not present

## 2020-08-22 DIAGNOSIS — Z66 Do not resuscitate: Secondary | ICD-10-CM

## 2020-08-22 DIAGNOSIS — G9340 Encephalopathy, unspecified: Secondary | ICD-10-CM | POA: Diagnosis not present

## 2020-08-22 DIAGNOSIS — Z9114 Patient's other noncompliance with medication regimen: Secondary | ICD-10-CM

## 2020-08-22 DIAGNOSIS — G3109 Other frontotemporal dementia: Secondary | ICD-10-CM | POA: Diagnosis present

## 2020-08-22 DIAGNOSIS — K219 Gastro-esophageal reflux disease without esophagitis: Secondary | ICD-10-CM | POA: Diagnosis not present

## 2020-08-22 DIAGNOSIS — R69 Illness, unspecified: Secondary | ICD-10-CM | POA: Diagnosis not present

## 2020-08-22 DIAGNOSIS — J9601 Acute respiratory failure with hypoxia: Secondary | ICD-10-CM | POA: Diagnosis not present

## 2020-08-22 DIAGNOSIS — N400 Enlarged prostate without lower urinary tract symptoms: Secondary | ICD-10-CM | POA: Diagnosis present

## 2020-08-22 DIAGNOSIS — J690 Pneumonitis due to inhalation of food and vomit: Secondary | ICD-10-CM

## 2020-08-22 DIAGNOSIS — Z7401 Bed confinement status: Secondary | ICD-10-CM | POA: Diagnosis not present

## 2020-08-22 DIAGNOSIS — Z993 Dependence on wheelchair: Secondary | ICD-10-CM

## 2020-08-22 DIAGNOSIS — R0689 Other abnormalities of breathing: Secondary | ICD-10-CM | POA: Diagnosis not present

## 2020-08-22 DIAGNOSIS — R509 Fever, unspecified: Secondary | ICD-10-CM | POA: Diagnosis not present

## 2020-08-22 DIAGNOSIS — R4182 Altered mental status, unspecified: Secondary | ICD-10-CM

## 2020-08-22 DIAGNOSIS — R400 Somnolence: Secondary | ICD-10-CM | POA: Diagnosis not present

## 2020-08-22 DIAGNOSIS — Z7984 Long term (current) use of oral hypoglycemic drugs: Secondary | ICD-10-CM

## 2020-08-22 DIAGNOSIS — R404 Transient alteration of awareness: Secondary | ICD-10-CM | POA: Diagnosis not present

## 2020-08-22 DIAGNOSIS — W19XXXA Unspecified fall, initial encounter: Secondary | ICD-10-CM

## 2020-08-22 DIAGNOSIS — H5462 Unqualified visual loss, left eye, normal vision right eye: Secondary | ICD-10-CM | POA: Diagnosis present

## 2020-08-22 DIAGNOSIS — R0902 Hypoxemia: Secondary | ICD-10-CM | POA: Diagnosis not present

## 2020-08-22 DIAGNOSIS — J189 Pneumonia, unspecified organism: Secondary | ICD-10-CM | POA: Diagnosis not present

## 2020-08-22 DIAGNOSIS — J698 Pneumonitis due to inhalation of other solids and liquids: Secondary | ICD-10-CM | POA: Diagnosis not present

## 2020-08-22 DIAGNOSIS — R296 Repeated falls: Secondary | ICD-10-CM | POA: Diagnosis present

## 2020-08-22 DIAGNOSIS — Z87440 Personal history of urinary (tract) infections: Secondary | ICD-10-CM

## 2020-08-22 DIAGNOSIS — N401 Enlarged prostate with lower urinary tract symptoms: Secondary | ICD-10-CM | POA: Diagnosis not present

## 2020-08-22 LAB — URINALYSIS, ROUTINE W REFLEX MICROSCOPIC
Bilirubin Urine: NEGATIVE
Glucose, UA: NEGATIVE mg/dL
Ketones, ur: 5 mg/dL — AB
Nitrite: NEGATIVE
Protein, ur: 100 mg/dL — AB
Specific Gravity, Urine: 1.014 (ref 1.005–1.030)
pH: 5 (ref 5.0–8.0)

## 2020-08-22 LAB — BASIC METABOLIC PANEL
Anion gap: 12 (ref 5–15)
BUN: 13 mg/dL (ref 8–23)
CO2: 27 mmol/L (ref 22–32)
Calcium: 8.8 mg/dL — ABNORMAL LOW (ref 8.9–10.3)
Chloride: 100 mmol/L (ref 98–111)
Creatinine, Ser: 1.17 mg/dL (ref 0.61–1.24)
GFR, Estimated: >60 mL/min (ref >60)
Glucose, Bld: 154 mg/dL — ABNORMAL HIGH (ref 70–99)
Potassium: 3.6 mmol/L (ref 3.5–5.1)
Sodium: 139 mmol/L (ref 135–145)

## 2020-08-22 LAB — CBG MONITORING, ED: Glucose-Capillary: 114 mg/dL — ABNORMAL HIGH (ref 70–99)

## 2020-08-22 LAB — CBC
HCT: 34.6 % — ABNORMAL LOW (ref 39.0–52.0)
Hemoglobin: 11.2 g/dL — ABNORMAL LOW (ref 13.0–17.0)
MCH: 28.5 pg (ref 26.0–34.0)
MCHC: 32.4 g/dL (ref 30.0–36.0)
MCV: 88 fL (ref 80.0–100.0)
Platelets: 214 10*3/uL (ref 150–400)
RBC: 3.93 MIL/uL — ABNORMAL LOW (ref 4.22–5.81)
RDW: 13.8 % (ref 11.5–15.5)
WBC: 10.6 10*3/uL — ABNORMAL HIGH (ref 4.0–10.5)
nRBC: 0 % (ref 0.0–0.2)

## 2020-08-22 LAB — TROPONIN I (HIGH SENSITIVITY)
Troponin I (High Sensitivity): 9 ng/L (ref <18)
Troponin I (High Sensitivity): 8 ng/L (ref <18)

## 2020-08-22 LAB — LIPASE, BLOOD: Lipase: 23 U/L (ref 11–51)

## 2020-08-22 LAB — LACTIC ACID, PLASMA: Lactic Acid, Venous: 0.7 mmol/L (ref 0.5–1.9)

## 2020-08-22 MED ORDER — ENOXAPARIN SODIUM 40 MG/0.4ML ~~LOC~~ SOLN
40.0000 mg | SUBCUTANEOUS | Status: DC
  Administered 2020-08-22 – 2020-09-06 (×16): 40 mg via SUBCUTANEOUS
  Filled 2020-08-22 (×16): qty 0.4

## 2020-08-22 MED ORDER — SODIUM CHLORIDE 0.9 % IV SOLN
1.0000 g | Freq: Once | INTRAVENOUS | Status: AC
  Administered 2020-08-22: 1 g via INTRAVENOUS
  Filled 2020-08-22: qty 10

## 2020-08-22 MED ORDER — TAMSULOSIN HCL 0.4 MG PO CAPS
0.4000 mg | ORAL_CAPSULE | Freq: Every day | ORAL | Status: DC
  Administered 2020-08-22 – 2020-09-05 (×13): 0.4 mg via ORAL
  Filled 2020-08-22 (×14): qty 1

## 2020-08-22 MED ORDER — SODIUM CHLORIDE 0.9 % IV BOLUS
1000.0000 mL | Freq: Once | INTRAVENOUS | Status: AC
  Administered 2020-08-22: 1000 mL via INTRAVENOUS

## 2020-08-22 MED ORDER — INSULIN ASPART 100 UNIT/ML ~~LOC~~ SOLN
0.0000 [IU] | Freq: Three times a day (TID) | SUBCUTANEOUS | Status: DC
  Administered 2020-08-23 – 2020-08-24 (×4): 2 [IU] via SUBCUTANEOUS
  Administered 2020-08-24: 5 [IU] via SUBCUTANEOUS
  Administered 2020-08-24 – 2020-08-25 (×3): 2 [IU] via SUBCUTANEOUS
  Administered 2020-08-25: 7 [IU] via SUBCUTANEOUS
  Administered 2020-08-26: 5 [IU] via SUBCUTANEOUS
  Administered 2020-08-26: 3 [IU] via SUBCUTANEOUS

## 2020-08-22 MED ORDER — TEMAZEPAM 15 MG PO CAPS
15.0000 mg | ORAL_CAPSULE | Freq: Once | ORAL | Status: AC
  Administered 2020-08-23: 15 mg via ORAL
  Filled 2020-08-22: qty 1

## 2020-08-22 MED ORDER — ACETAMINOPHEN 650 MG RE SUPP: 650.0000 mg | Freq: Four times a day (QID) | RECTAL | Status: DC | PRN

## 2020-08-22 MED ORDER — METRONIDAZOLE 500 MG PO TABS
500.0000 mg | ORAL_TABLET | Freq: Three times a day (TID) | ORAL | Status: DC
  Administered 2020-08-22: 500 mg via ORAL
  Filled 2020-08-22 (×2): qty 1

## 2020-08-22 MED ORDER — ACETAMINOPHEN 325 MG PO TABS
650.0000 mg | ORAL_TABLET | Freq: Four times a day (QID) | ORAL | Status: DC | PRN
  Administered 2020-08-26 – 2020-08-30 (×3): 650 mg via ORAL
  Filled 2020-08-22 (×3): qty 2

## 2020-08-22 NOTE — ED Notes (Signed)
Help get patient on the monitor help with in and out cath patient is resting with nurse at bedside and call bell in reach

## 2020-08-22 NOTE — ED Provider Notes (Signed)
[facility] EMERGENCY DEPARTMENT Provider Note   CSN: [id number] Arrival date & time: 08/22/20  1105     History Chief Complaint  Patient presents with  . Altered Mental Status    Daniel Morrow is a 79 y.o. male.  Patient is a 79 year old male with history of dementia, hypertension, recurrent UTIs, diabetes.  He is sent from home for evaluation of altered mental status.  Patient apparently diagnosed with a UTI recently.  Yesterday he became weaker and less verbal.  Patient unable to add much additional history secondary to mental status.  EMS reports that family reports fever to 101 yesterday.  The history is provided by the patient.  Altered Mental Status Presenting symptoms: partial responsiveness   Severity:  Moderate Most recent episode:  Yesterday Episode history:  Single Timing:  Constant Progression:  Worsening Chronicity:  New Associated symptoms: fever        Past Medical History:  Diagnosis Date  . Aphagia   . Arthritis    "knees" (09/14/2018)  . Blind left eye   . Chronic bronchitis (HCC)   . Dementia (HCC)   . Depression   . GERD (gastroesophageal reflux disease)   . Glaucoma   . History of kidney stones   . Hyperlipidemia   . Hypertension   . Pneumonia    "maybe twice" (09/14/2018)  . Recurrent UTI (urinary tract infection)   . Stroke [facility]) 11/2011   "didn't effect him at the time" (09/14/2018)  . Type II diabetes mellitus [facility])     Patient Active Problem List   Diagnosis Date Noted  . Hypoxia 10/04/2019  . Glaucoma 10/04/2019  . Foreign body in esophagus   . Esophageal stenosis   . Hiatal hernia   . Chronic anticoagulation   . Esophageal dysphagia   . Esophageal stricture   . Shortness of breath at rest 09/12/2018  . Shortness of breath 09/12/2018  . Vision, loss, sudden 06/03/2016  . CRAO (central retinal artery occlusion) 06/03/2016  . Alzheimer's disease (HCC) 01/16/2016  . Epididymo-orchitis 12/02/2013  .  Orchitis and epididymitis 12/02/2013  . Mild cognitive impairment, so stated 02/17/2013  . CVA (cerebral infarction) 02/17/2013  . 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 Hypertension 04/03/2012  . Type II diabetes mellitus (HCC) 04/03/2012  . Hyperlipidemia     Past Surgical History:  Procedure Laterality Date  . ABDOMINAL HERNIA REPAIR    . CATARACT EXTRACTION W/ INTRAOCULAR LENS  IMPLANT, BILATERAL Bilateral   . COLONOSCOPY    . ESOPHAGOGASTRODUODENOSCOPY N/A 01/11/2019   Procedure: ESOPHAGOGASTRODUODENOSCOPY (EGD);  Surgeon: Meryl Dare, MD;  Location: [facility] ENDOSCOPY;  Service: Endoscopy;  Laterality: N/A;  . ESOPHAGOGASTRODUODENOSCOPY Left 10/04/2019   Procedure: ESOPHAGOGASTRODUODENOSCOPY (EGD);  Surgeon: Shellia Cleverly, DO;  Location: [facility] ENDOSCOPY;  Service: Gastroenterology;  Laterality: Left;  . ESOPHAGOGASTRODUODENOSCOPY (EGD) WITH ESOPHAGEAL DILATION  X 2  . FOREIGN BODY REMOVAL  01/11/2019  . FRACTURE SURGERY    . HERNIA REPAIR    . IMPACTION REMOVAL  10/04/2019   Procedure: IMPACTION REMOVAL;  Surgeon: Shellia Cleverly, DO;  Location: [facility] ENDOSCOPY;  Service: Gastroenterology;;  . PATELLA FRACTURE SURGERY Left   . POSTERIOR CERVICAL FUSION/FORAMINOTOMY         Family History  Problem Relation Age of Onset  . Polycythemia Mother   . Lung cancer Father   . Other Brother  brain tumor  . Diabetes Daughter   . Colon cancer Neg Hx   . Esophageal cancer Neg Hx   . Rectal cancer Neg Hx   . Stomach cancer Neg Hx     Social History   Tobacco Use  . Smoking status: Former Smoker    Packs/day: 1.00    Years: 20.00    Pack years: 20.00    Types: Cigarettes    Quit date: 1980    Years since quitting: 41.9  . Smokeless tobacco: Former Neurosurgeon    Quit date: 1985  Vaping Use  . Vaping Use: Never used  Substance Use Topics  .  Alcohol use: No  . Drug use: Never    Home Medications Prior to Admission medications   Medication Sig Start Date End Date Taking? Authorizing Provider  acetaminophen (TYLENOL) 325 MG tablet Take 650 mg by mouth every 6 (six) hours as needed for mild pain, fever or headache.    [provider]  amLODipine (NORVASC) 10 MG tablet Take 1 tablet (10 mg total) by mouth daily. 10/05/19 10/04/20  Burnadette Pop, MD  benazepril (LOTENSIN) 20 MG tablet Take 20 mg by mouth daily.     [provider]  brimonidine (ALPHAGAN) 0.2 % ophthalmic solution Place 1 drop into the right eye 2 (two) times daily.  02/25/18   [provider]  colesevelam (WELCHOL) 625 MG tablet Take 1,875 mg by mouth 2 (two) times daily with a meal.  12/22/18   [provider]  dipyridamole-aspirin (AGGRENOX) 200-25 MG 12hr capsule Take 1 capsule by mouth 2 (two) times daily.    [provider]  glimepiride (AMARYL) 4 MG tablet Take 4 mg by mouth daily.  06/12/16   [provider]  latanoprost (XALATAN) 0.005 % ophthalmic solution Place 1 drop into the right eye at bedtime.  06/02/16   [provider]  memantine (NAMENDA) 10 MG tablet Take 1 tablet (10 mg total) by mouth 2 (two) times daily. 08/24/18   Micki Riley, MD  OZEMPIC, 1 MG/DOSE, 4 MG/3ML SOPN Inject 1 mg into the skin once a week. Mondays 07/30/20   [provider]  pantoprazole (PROTONIX) 20 MG tablet Take 20 mg by mouth daily.    [provider]  Potassium Chloride 10 % SOLN Take 20 mEq by mouth in the morning and at bedtime for 5 days. 08/07/20 08/12/20  Pricilla Loveless, MD  sertraline (ZOLOFT) 100 MG tablet Take 1 tablet (100 mg total) by mouth daily. Patient taking differently: Take 150 mg by mouth daily.  03/02/17   Micki Riley, MD  timolol (TIMOPTIC) 0.5 % ophthalmic solution Place 1 drop into the right eye 2 (two) times daily.    [provider]  traZODone (DESYREL) 100 MG tablet  Take 100 mg by mouth at bedtime. 07/23/20   [provider]  vitamin B-12 (CYANOCOBALAMIN) 1000 MCG tablet Take 1,000 mcg by mouth daily.    [provider]  sucralfate (CARAFATE) 1 GM/10ML suspension Take 10 mLs (1 g total) by mouth 4 (four) times daily. Patient not taking: Reported on 11/15/2019 10/04/19 04/14/20  Shellia Cleverly, DO    Allergies    Patient has no known allergies.  Review of Systems   Review of Systems  Unable to perform ROS: Acuity of condition  Constitutional: Positive for fever.    Physical Exam Updated Vital Signs BP 129/75   Pulse 77   Temp 99.5 F (37.5 C) (Rectal)   Resp Marland Kitchen)  28   SpO2 98%   Physical Exam Vitals and nursing note reviewed.  Constitutional:      General: He is not in acute distress.    Appearance: He is well-developed. He is not diaphoretic.     Comments: Patient is awake and alert.  He does follow commands but is slow to do so.  He does not speak.  HENT:     Head: Normocephalic and atraumatic.  Cardiovascular:     Rate and Rhythm: Normal rate and regular rhythm.     Heart sounds: No murmur heard.  No friction rub.  Pulmonary:     Effort: Pulmonary effort is normal. No respiratory distress.     Breath sounds: Normal breath sounds. No wheezing or rales.  Abdominal:     General: Bowel sounds are normal. There is no distension.     Palpations: Abdomen is soft.     Tenderness: There is no abdominal tenderness.  Musculoskeletal:        General: Normal range of motion.     Cervical back: Normal range of motion and neck supple.  Skin:    General: Skin is warm and dry.  Neurological:     Mental Status: He is oriented to person, place, and time.     Coordination: Coordination normal.     Comments: Strength does seem to be diminished with the left arm and leg, but exam is somewhat limited secondary to dementia, level of consciousness.     ED Results / Procedures / Treatments   Labs (all labs ordered are listed,  but only abnormal results are displayed) Labs Reviewed  URINE CULTURE  CULTURE, BLOOD (ROUTINE X 2)  CULTURE, BLOOD (ROUTINE X 2)  URINALYSIS, ROUTINE W REFLEX MICROSCOPIC  BASIC METABOLIC PANEL  CBC  LIPASE, BLOOD  LACTIC ACID, PLASMA  LACTIC ACID, PLASMA  TROPONIN I (HIGH SENSITIVITY)    EKG EKG Interpretation  Date/Time:  Wednesday August 22 2020 11:12:53 EST Ventricular Rate:  78 PR Interval:    QRS Duration: 105 QT Interval:  421 QTC Calculation: 480 R Axis:   82 Text Interpretation: Sinus rhythm Borderline right axis deviation Borderline prolonged QT interval Confirmed by Geoffery LyonseLo, Christyn Gutkowski ([id number]) on 08/22/2020 1:23:57 PM   Radiology No results found.  Procedures Procedures (including critical care time)  Medications Ordered in ED Medications  sodium chloride 0.9 % bolus 1,000 mL (has no administration in time range)    ED Course  I have reviewed the triage vital signs and the nursing notes.  Pertinent labs & imaging results that were available during my care of the patient were reviewed by me and considered in my medical decision making (see chart for details).    MDM Rules/Calculators/A&P  Patient presenting here with complaints of weakness and altered mental status.  He has history of prior stroke and is minimally verbal, however has been less vocal over the past 2 days.  Is also been treated for urinary tract infection with 2 antibiotics in the past 2 weeks.  Patient's work-up shows persistent UTI despite 2 courses of antibiotics.  Remainder of his laboratory studies are unremarkable.  According to the wife at bedside, he has been less mobile today and she has concerns about taking him home.  I have discussed the case with the hospitalist who will evaluate and admit.  Final Clinical Impression(s) / ED Diagnoses Final diagnoses:  None    Rx / DC Orders ED Discharge Orders    None  Geoffery Lyons, MD 08/22/20 [phone]

## 2020-08-22 NOTE — ED Triage Notes (Signed)
BIB EMS for increase in AMS. Pt was dx with UTI 2 weeks ago and finished all abx. Pt mental status got worse yesterday: weaker, lethargic, unable to ambulate. Baseline was ambulatory with walker, follows command and somewhat verbal. Fever at home, no meds given. 99.5 rectal here.

## 2020-08-22 NOTE — H&P (Signed)
History and Physical    Daniel Morrow ZOX:[id number] DOB: 04/29/1941 DOA: 08/22/2020  PCP: Kirby FunkGriffin, John, MD (Confirm with patient/family/NH records and if not entered, this [facility]) Patient coming from: Home  I have personally briefly reviewed [facility]  Chief Complaint: Pt non-verbal  HPI: Daniel Morrow is a 79 y.o. male with medical history significant of frontotemporal dementia, recurrent UTIs, BPH noncompliant with medications, HTN, IIDM, chronic ambulatory dysfunction, depression anxiety, presented with fever, hypoxia.  Patient nonverbal at baseline minimally responsive, most history provided by patient and wife at bedside.  Wife reported patient has had several episodes of UTI this year after stopped taking his BPH medications.  Each time when UTI happens, patient usually became less responsive.  Baseline mental status wise, patient has had worsening of being responsive to outside stimulus, and recent month his mentation has significantly deteriorated.  For last 4 weeks, patient has been bedbound and wheelchair-bound, unable to transfer himself.  Compared to before he was able to use a walker despite being very wobbly and has sustained multiple falls.  Last night, it appears that patient choked on his food and medications.  And started to have cough, wife noticed patient had a fever 102 and breathing very fast.  Later that night, wife checked patient pulse ox was 88 compared to upper 90s his baseline.  At some point, patient also had a fall and hit the back of the head.  And patient started to became very lethargic and sleepy and less responsive.  Before this, 2 weeks ago patient was in the emergency room for similar episode was diagnosed with UTI and for which patient completed 7 days course of p.o. antibiotics.  Overall, wife reported patient mentation has deteriorated over last month. ED Course: UA showed pyuria.   Patient was found to have hypoxia and stabilized on 3 L.  Chest x-ray showed lung congested.  Review of Systems: Unable to perform patient minimum response.  Past Medical History:  Diagnosis Date  . Aphagia   . Arthritis    "knees" (09/14/2018)  . Blind left eye   . Chronic bronchitis (HCC)   . Dementia (HCC)   . Depression   . GERD (gastroesophageal reflux disease)   . Glaucoma   . History of kidney stones   . Hyperlipidemia   . Hypertension   . Pneumonia    "maybe twice" (09/14/2018)  . Recurrent UTI (urinary tract infection)   . Stroke [facility](HCC) 11/2011   "didn't effect him at the time" (09/14/2018)  . Type II diabetes mellitus (HCC)     Past Surgical History:  Procedure Laterality Date  . ABDOMINAL HERNIA REPAIR    . CATARACT EXTRACTION W/ INTRAOCULAR LENS  IMPLANT, BILATERAL Bilateral   . COLONOSCOPY    . ESOPHAGOGASTRODUODENOSCOPY N/A 01/11/2019   Procedure: ESOPHAGOGASTRODUODENOSCOPY (EGD);  Surgeon: Meryl DareStark, Malcolm T, MD;  [facility];  Service: Endoscopy;  Laterality: N/A;  . ESOPHAGOGASTRODUODENOSCOPY Left 10/04/2019   Procedure: ESOPHAGOGASTRODUODENOSCOPY (EGD);  Surgeon: Shellia Cleverlyirigliano, Vito V, DO;  Location: [facility] ENDOSCOPY;  Service: Gastroenterology;  Laterality: Left;  . ESOPHAGOGASTRODUODENOSCOPY (EGD) WITH ESOPHAGEAL DILATION  X 2  . FOREIGN BODY REMOVAL  01/11/2019  . FRACTURE SURGERY    . HERNIA REPAIR    . IMPACTION REMOVAL  10/04/2019   Procedure: IMPACTION REMOVAL;  Surgeon: Shellia Cleverlyirigliano, Vito V, DO;  Location: [facility] ENDOSCOPY;  Service: Gastroenterology;;  . PATELLA FRACTURE SURGERY Left   .  POSTERIOR CERVICAL FUSION/FORAMINOTOMY       reports that he quit smoking about 41 years ago. His smoking use included cigarettes. He has a 20.00 pack-year smoking history. He quit smokeless tobacco use about 36 years ago. He reports that he does not drink alcohol and does not use drugs.  No Known Allergies  Family History  Problem Relation Age of Onset  .  Polycythemia Mother   . Lung cancer Father   . Other Brother        brain tumor  . Diabetes Daughter   . Colon cancer Neg Hx   . Esophageal cancer Neg Hx   . Rectal cancer Neg Hx   . Stomach cancer Neg Hx      Prior to Admission medications   Medication Sig Start Date End Date Taking? Authorizing Provider  acetaminophen (TYLENOL) 325 MG tablet Take 650 mg by mouth every 6 (six) hours as needed for mild pain, fever or headache.    [provider]  amLODipine (NORVASC) 10 MG tablet Take 1 tablet (10 mg total) by mouth daily. 10/05/19 10/04/20  Burnadette Pop, MD  benazepril (LOTENSIN) 20 MG tablet Take 20 mg by mouth daily.     [provider]  brimonidine (ALPHAGAN) 0.2 % ophthalmic solution Place 1 drop into the right eye 2 (two) times daily.  02/25/18   [provider]  colesevelam (WELCHOL) 625 MG tablet Take 1,875 mg by mouth 2 (two) times daily with a meal.  12/22/18   [provider]  dipyridamole-aspirin (AGGRENOX) 200-25 MG 12hr capsule Take 1 capsule by mouth 2 (two) times daily.    [provider]  glimepiride (AMARYL) 4 MG tablet Take 4 mg by mouth daily.  06/12/16   [provider]  latanoprost (XALATAN) 0.005 % ophthalmic solution Place 1 drop into the right eye at bedtime.  06/02/16   [provider]  memantine (NAMENDA) 10 MG tablet Take 1 tablet (10 mg total) by mouth 2 (two) times daily. 08/24/18   Micki Riley, MD  OZEMPIC, 1 MG/DOSE, 4 MG/3ML SOPN Inject 1 mg into the skin once a week. Mondays 07/30/20   [provider]  pantoprazole (PROTONIX) 20 MG tablet Take 20 mg by mouth daily.    [provider]  Potassium Chloride 10 % SOLN Take 20 mEq by mouth in the morning and at bedtime for 5 days. 08/07/20 08/12/20  Pricilla Loveless, MD  sertraline (ZOLOFT) 100 MG tablet Take 1 tablet (100 mg total) by mouth daily. Patient taking differently: Take 150 mg by mouth daily.  03/02/17   Micki Riley, MD   timolol (TIMOPTIC) 0.5 % ophthalmic solution Place 1 drop into the right eye 2 (two) times daily.    [provider]  traZODone (DESYREL) 100 MG tablet Take 100 mg by mouth at bedtime. 07/23/20   [provider]  vitamin B-12 (CYANOCOBALAMIN) 1000 MCG tablet Take 1,000 mcg by mouth daily.    [provider]  sucralfate (CARAFATE) 1 GM/10ML suspension Take 10 mLs (1 g total) by mouth 4 (four) times daily. Patient not taking: Reported on 11/15/2019 10/04/19 04/14/20  Shellia Cleverly, DO    Physical Exam: Vitals:   08/22/20 1330 08/22/20 1400 08/22/20 1415 08/22/20 1445  BP: 128/66 (!) 148/74 136/75 136/75  Pulse: 74 82  81  Resp: (!) 22 (!) 24  (!) 22  Temp:      TempSrc:      SpO2: 96% 95%  94%  Constitutional: NAD, calm, comfortable Vitals:   08/22/20 1330 08/22/20 1400 08/22/20 1415 08/22/20 1445  BP: 128/66 (!) 148/74 136/75 136/75  Pulse: 74 82  81  Resp: (!) 22 (!) 24  (!) 22  Temp:      TempSrc:      SpO2: 96% 95%  94%   Eyes: PERRL, lids and conjunctivae normal ENMT: Mucous membranes are moist. Posterior pharynx clear of any exudate or lesions.Normal dentition.  Neck: normal, supple, no masses, no thyromegaly Respiratory: clear to auscultation bilaterally, no wheezing, no crackles. Normal respiratory effort. No accessory muscle use.  Cardiovascular: Regular rate and rhythm, loud systolic murmur on heart base. No extremity edema. 2+ pedal pulses. No carotid bruits.  Abdomen: no tenderness, no masses palpated. No hepatosplenomegaly. Bowel sounds positive.  Musculoskeletal: no clubbing / cyanosis. No joint deformity upper and lower extremities. Good ROM, no contractures. Normal muscle tone.  Skin: no rashes, lesions, ulcers. No induration Neurologic: Opens eyes, not following command, moving all limbs, increased muscle rigidity, positive for resting tremors Psychiatric: Opens eyes spontaneously but not talking or responding.    Labs on  Admission: I have personally reviewed following labs and imaging studies  CBC: Recent Labs  Lab 08/22/20 1133  WBC 10.6*  HGB 11.2*  HCT 34.6*  MCV 88.0  PLT 214   Basic Metabolic Panel: Recent Labs  Lab 08/22/20 1133  NA 139  K 3.6  CL 100  CO2 27  GLUCOSE 154*  BUN 13  CREATININE 1.17  CALCIUM 8.8*   GFR: CrCl cannot be calculated (Unknown ideal weight.). Liver Function Tests: No results for input(s): AST, ALT, ALKPHOS, BILITOT, PROT, ALBUMIN in the last 168 hours. Recent Labs  Lab 08/22/20 1133  LIPASE 23   No results for input(s): AMMONIA in the last 168 hours. Coagulation Profile: No results for input(s): INR, PROTIME in the last 168 hours. Cardiac Enzymes: No results for input(s): CKTOTAL, CKMB, CKMBINDEX, TROPONINI in the last 168 hours. BNP (last 3 results) No results for input(s): PROBNP in the last 8760 hours. HbA1C: No results for input(s): HGBA1C in the last 72 hours. CBG: No results for input(s): GLUCAP in the last 168 hours. Lipid Profile: No results for input(s): CHOL, HDL, LDLCALC, TRIG, CHOLHDL, LDLDIRECT in the last 72 hours. Thyroid Function Tests: No results for input(s): TSH, T4TOTAL, FREET4, T3FREE, THYROIDAB in the last 72 hours. Anemia Panel: No results for input(s): VITAMINB12, FOLATE, FERRITIN, TIBC, IRON, RETICCTPCT in the last 72 hours. Urine analysis:    Component Value Date/Time   COLORURINE YELLOW 08/22/2020 1124   APPEARANCEUR HAZY (A) 08/22/2020 1124   LABSPEC 1.014 08/22/2020 1124   PHURINE 5.0 08/22/2020 1124   GLUCOSEU NEGATIVE 08/22/2020 1124   HGBUR MODERATE (A) 08/22/2020 1124   BILIRUBINUR NEGATIVE 08/22/2020 1124   KETONESUR 5 (A) 08/22/2020 1124   PROTEINUR 100 (A) 08/22/2020 1124   UROBILINOGEN 0.2 12/02/2013 0611   NITRITE NEGATIVE 08/22/2020 1124   LEUKOCYTESUR MODERATE (A) 08/22/2020 1124    Radiological Exams on Admission: CT Head Wo Contrast  Result Date: 08/22/2020 CLINICAL DATA:  Mental status  change EXAM: CT HEAD WITHOUT CONTRAST TECHNIQUE: Contiguous axial images were obtained from the base of the skull through the vertex without intravenous contrast. COMPARISON:  08/07/2020 FINDINGS: Motion artifact is present on several slices. Brain: There is no acute intracranial hemorrhage, mass effect, or edema. Gray-white differentiation is preserved within the above limitation. There is no extra-axial fluid collection. Patchy hypoattenuation in the supratentorial white matter is nonspecific  but likely reflects stable chronic microvascular ischemic changes. Small chronic right cerebellar infarct. Ventricles and sulci are stable in size and configuration. Vascular: There is atherosclerotic calcification at the skull base. Skull: Calvarium is unremarkable. Sinuses/Orbits: No acute finding.  Stable appearance of the orbits. Other: None. IMPRESSION: Motion degraded study. No acute intracranial abnormality or significant change since recent prior study. Electronically Signed   By: Guadlupe Spanish M.D.   On: 08/22/2020 14:00   DG Chest Port 1 View  Result Date: 08/22/2020 CLINICAL DATA:  Fever and altered level of consciousness. EXAM: PORTABLE CHEST 1 VIEW COMPARISON:  Single-view of the chest 08/07/2020 and PA and lateral chest 12/21/2017. FINDINGS: Very mild bibasilar atelectasis process, pneumothorax. No consolidative or effusion. Lung volumes are somewhat low. Heart size is normal. No acute or focal bony abnormality. IMPRESSION: No acute disease. Electronically Signed   By: Drusilla Kanner M.D.   On: 08/22/2020 11:59    EKG: Independently reviewed.  Sinus, no acute ST-T changes  Assessment/Plan Active Problems:   * No active hospital problems. *  (please populate well all problems here in Problem List. (For example, if patient is on BP meds at home and you resume or decide to hold them, it is a problem that needs to be her. Same for CAD, COPD, HLD and so on)  Encephalopathy -Etiology appears to be  acute on chronic, acute event likely aspiration pneumonia plus minus a recurrent UTI.  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 -Discussed with wife at bedside regarding prognosis, given the clinical course appears to be patient overall mentation deteriorated over last few months, more likely mentation wise patient will not recover to his relatively healthier baseline couple months ago.  Wife agreed with evaluation and expressed interest in palliative care bridging for hospice, if no significant clinical meaningful improvement in the near future.  Acute hypoxic respite failure -Likely from aspiration pneumonia from last night's aspiration event. -Ceftriaxone plus Flagyl -Speech evaluation  Recurrent UTI -Check PVR and renal ultrasound,.  Patient has had a history of BPH, unknown treatment history. -Most recent urine culture 2 weeks ago showed Klebsiella pansensitive, continue ceftriaxone I will consider de-escalate if patient symptoms improved tomorrow.  Frontotemporal dementia -Continue memantine, wife reported patient has lost follow-up with neurology since earlier this year.  IIDM -Sliding scale for now  HTN -Continue home meds  DVT prophylaxis: Lovenox Code Status: DNR Family Communication: Wife at bedside Disposition Plan: Expect more than 2 midnight hospital stay to treat aspiration pneumonia and mentation status Consults called: Palliative care Admission status: MedSurg, telemetry monitor x24 hours then reevaluate   Emeline General MD [facility] Pager [phone]  08/22/2020, 2:56 PM

## 2020-08-23 DIAGNOSIS — N390 Urinary tract infection, site not specified: Secondary | ICD-10-CM

## 2020-08-23 DIAGNOSIS — J690 Pneumonitis due to inhalation of food and vomit: Principal | ICD-10-CM

## 2020-08-23 DIAGNOSIS — I10 Essential (primary) hypertension: Secondary | ICD-10-CM

## 2020-08-23 LAB — HEMOGLOBIN A1C
Hgb A1c MFr Bld: 7 % — ABNORMAL HIGH (ref 4.8–5.6)
Mean Plasma Glucose: 154.2 mg/dL

## 2020-08-23 LAB — CBG MONITORING, ED: Glucose-Capillary: 159 mg/dL — ABNORMAL HIGH (ref 70–99)

## 2020-08-23 LAB — CBC
HCT: 34.8 % — ABNORMAL LOW (ref 39.0–52.0)
Hemoglobin: 11 g/dL — ABNORMAL LOW (ref 13.0–17.0)
MCH: 27.8 pg (ref 26.0–34.0)
MCHC: 31.6 g/dL (ref 30.0–36.0)
MCV: 87.9 fL (ref 80.0–100.0)
Platelets: 199 10*3/uL (ref 150–400)
RBC: 3.96 MIL/uL — ABNORMAL LOW (ref 4.22–5.81)
RDW: 13.6 % (ref 11.5–15.5)
WBC: 6.8 10*3/uL (ref 4.0–10.5)
nRBC: 0 % (ref 0.0–0.2)

## 2020-08-23 LAB — URINE CULTURE: Culture: NO GROWTH

## 2020-08-23 LAB — GLUCOSE, CAPILLARY
Glucose-Capillary: 167 mg/dL — ABNORMAL HIGH (ref 70–99)
Glucose-Capillary: 186 mg/dL — ABNORMAL HIGH (ref 70–99)
Glucose-Capillary: 214 mg/dL — ABNORMAL HIGH (ref 70–99)

## 2020-08-23 MED ORDER — SODIUM CHLORIDE 0.9 % IV SOLN
3.0000 g | Freq: Four times a day (QID) | INTRAVENOUS | Status: AC
  Administered 2020-08-23 – 2020-08-27 (×17): 3 g via INTRAVENOUS
  Filled 2020-08-23 (×2): qty 8
  Filled 2020-08-23: qty 3
  Filled 2020-08-23 (×9): qty 8
  Filled 2020-08-23: qty 3
  Filled 2020-08-23 (×2): qty 8
  Filled 2020-08-23: qty 3
  Filled 2020-08-23 (×2): qty 8

## 2020-08-23 MED ORDER — LORAZEPAM 2 MG/ML IJ SOLN
1.0000 mg | Freq: Once | INTRAMUSCULAR | Status: AC
  Administered 2020-08-23: 1 mg via INTRAVENOUS
  Filled 2020-08-23: qty 1

## 2020-08-23 MED ORDER — SODIUM CHLORIDE 0.9 % IV SOLN: INTRAVENOUS | Status: DC

## 2020-08-23 NOTE — Progress Notes (Signed)
Patient received to room 3W02.  He is responsive to voice, opening his eyes and tracking at this time, but clearly not answering questions.  Skin is intact.  Bed in low position and condom cath placed, as he was incontinent of significant amt of urine on admission.  Family not in attendance at this time.  Bed locked and side rails up x 3.  Bedside mats placed for safety.  Call bell placed within reach.

## 2020-08-23 NOTE — Evaluation (Signed)
Clinical/Bedside Swallow Evaluation Patient Details  Name: Daniel Morrow MRN: [id number] Date of Birth: [birth year]  Today's Date: 08/23/2020 Time: SLP Start Time (ACUTE ONLY): 1314 SLP Stop Time (ACUTE ONLY): 1330 SLP Time Calculation (min) (ACUTE ONLY): 16 min  Past Medical History:  Past Medical History:  Diagnosis Date  . Aphagia   . Arthritis    "knees" (09/14/2018)  . Blind left eye   . Chronic bronchitis (HCC)   . Dementia (HCC)   . Depression   . GERD (gastroesophageal reflux disease)   . Glaucoma   . History of kidney stones   . Hyperlipidemia   . Hypertension   . Pneumonia    "maybe twice" (09/14/2018)  . Recurrent UTI (urinary tract infection)   . Stroke [facility]) 11/2011   "didn't effect him at the time" (09/14/2018)  . Type II diabetes mellitus (HCC)    Past Surgical History:  Past Surgical History:  Procedure Laterality Date  . ABDOMINAL HERNIA REPAIR    . CATARACT EXTRACTION W/ INTRAOCULAR LENS  IMPLANT, BILATERAL Bilateral   . COLONOSCOPY    . ESOPHAGOGASTRODUODENOSCOPY N/A 01/11/2019   Procedure: ESOPHAGOGASTRODUODENOSCOPY (EGD);  Surgeon: Daniel Dare, MD;  Location: [facility] ENDOSCOPY;  Service: Endoscopy;  Laterality: N/A;  . ESOPHAGOGASTRODUODENOSCOPY Left 10/04/2019   Procedure: ESOPHAGOGASTRODUODENOSCOPY (EGD);  Surgeon: Daniel Cleverly, DO;  Location: [facility] ENDOSCOPY;  Service: Gastroenterology;  Laterality: Left;  . ESOPHAGOGASTRODUODENOSCOPY (EGD) WITH ESOPHAGEAL DILATION  X 2  . FOREIGN BODY REMOVAL  01/11/2019  . FRACTURE SURGERY    . HERNIA REPAIR    . IMPACTION REMOVAL  10/04/2019   Procedure: IMPACTION REMOVAL;  Surgeon: Daniel Cleverly, DO;  Location: [facility] ENDOSCOPY;  Service: Gastroenterology;;  . PATELLA FRACTURE SURGERY Left   . POSTERIOR CERVICAL FUSION/FORAMINOTOMY     HPI:  Pt is a 79 y.o. male with medical history significant of frontotemporal dementia, recurrent UTIs, BPH noncompliant with medications, HTN, IIDM, chronic ambulatory  dysfunction, depression anxiety, who presented with fever, hypoxia, and AMS. Per H&P, pt nonverbal at baseline and minimally responsive. Note from ED MD stated "He has history of prior stroke and is minimally verbal, however has been less vocal over the past 2 days." CT head: No acute intracranial abnormality. CXR was negative for acute changes.    Assessment / Plan / Recommendation Clinical Impression  Pt was seen for bedside swallow evaluation. He did not communicate verbally or consistently follow commands. Oral mechanism exam was therefore limited but oral mucosa was moist and dentition adequate. He tolerated all solids and liquids without signs or symptoms of oropharyngeal dysphagia. A regular texture diet with thin liquids is recommended at this time and, considering his mentation, SLP will follow briefly to ensure diet tolerance.  SLP Visit Diagnosis: Dysphagia, unspecified (R13.10)    Aspiration Risk  Mild aspiration risk    Diet Recommendation Regular;Thin liquid   Liquid Administration via: Cup;Straw Medication Administration: Whole meds with puree Supervision: Staff to assist with self feeding Compensations: Slow rate;Small sips/bites;Minimize environmental distractions Postural Changes: Seated upright at 90 degrees    Other  Recommendations Oral Care Recommendations: Oral care BID   Follow up Recommendations None      Frequency and Duration min 1 x/week  1 week       Prognosis Prognosis for Safe Diet Advancement: Good      Swallow Study   General Date of Onset: 08/22/20 HPI: Pt is a 79 y.o. male with medical history significant of frontotemporal dementia, recurrent UTIs, BPH noncompliant  with medications, HTN, IIDM, chronic ambulatory dysfunction, depression anxiety, who presented with fever, hypoxia, and AMS. Per H&P, pt nonverbal at baseline and minimally responsive. Note from ED MD stated "He has history of prior stroke and is minimally verbal, however has been less  vocal over the past 2 days." CT head: No acute intracranial abnormality. CXR was negative for acute changes.  Type of Study: Bedside Swallow Evaluation Previous Swallow Assessment: NOne Diet Prior to this Study: NPO Temperature Spikes Noted: No Respiratory Status: Nasal cannula History of Recent Intubation: No Behavior/Cognition: Alert;Confused Oral Cavity Assessment: Within Functional Limits Oral Care Completed by SLP: No Vision: Functional for self-feeding Self-Feeding Abilities: Needs assist Patient Positioning: Upright in bed;Postural control adequate for testing Baseline Vocal Quality: Not observed Volitional Cough: Cognitively unable to elicit Volitional Swallow: Unable to elicit    Oral/Motor/Sensory Function Overall Oral Motor/Sensory Function:  (Pt unable to participate in assessment)   Ice Chips Ice chips: Within functional limits Presentation: Spoon   Thin Liquid Thin Liquid: Within functional limits Presentation: Straw;Cup;Self Fed (self-fed with hand over hand assistance)    Nectar Thick Nectar Thick Liquid: Not tested   Honey Thick Honey Thick Liquid: Not tested   Puree Puree: Within functional limits Presentation: Spoon   Solid     Solid: Within functional limits     Daniel Morrow I. Vear Clock, MS, CCC-SLP Acute Rehabilitation Services Office number [phone] Pager [phone]  Daniel Morrow 08/23/2020,1:29 PM

## 2020-08-23 NOTE — ED Notes (Signed)
Family at bedside. 

## 2020-08-23 NOTE — Progress Notes (Signed)
Wife at bedside.

## 2020-08-23 NOTE — ED Notes (Signed)
Lab to add A1c 

## 2020-08-23 NOTE — Progress Notes (Signed)
PMT consult received and chart reviewed. GOC meeting scheduled with wife and daughters tomorrow 12/3 around 9am. Thank you.   NO CHARGE  Vennie Homans, DNP, FNP-C Palliative Medicine Team  Phone: [phone] Fax: [phone]

## 2020-08-23 NOTE — Plan of Care (Signed)

## 2020-08-23 NOTE — Progress Notes (Signed)
PROGRESS NOTE    Daniel Morrow  NUU:[id number] DOB: September 02, 1941 DOA: 08/22/2020 PCP: Kirby Funk, MD    Brief Narrative:  Daniel Morrow is a 79 y.o. male with medical history significant of frontotemporal dementia, recurrent UTIs, BPH noncompliant with medications, HTN, IIDM, chronic ambulatory dysfunction, depression anxiety, presented with fever, hypoxia.  Patient nonverbal at baseline minimally responsive, most history provided by patient and wife at bedside.  Wife reported patient has had several episodes of UTI this year after stopped taking his BPH medications.  Each time when UTI happens, patient usually became less responsive.  Baseline mental status wise, patient has had worsening of being responsive to outside stimulus, and recent month his mentation has significantly deteriorated.  For last 4 weeks, patient has been bedbound and wheelchair-bound, unable to transfer himself.  Compared to before he was able to use a walker despite being very wobbly and has sustained multiple falls.    Consultants:   Palliative care  Procedures:   Antimicrobials:    flagyl -  Ceftriaxone 1 dose      Subjective: Nonverbal. Wife at bedside. 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 Agreeable to talk to hospice.  Objective: Vitals:   08/23/20 0730 08/23/20 0810 08/23/20 0815 08/23/20 0900  BP: (!) 141/77  125/66 123/77  Pulse: 81  79 76  Resp: (!) 23  (!) 26 20  Temp:  97.9 F (36.6 C)  98.4 F (36.9 C)  TempSrc:  Axillary  Oral  SpO2: 96%  96% 98%    Intake/Output Summary (Last 24 hours) at 08/23/2020 3474 Last data filed at 08/22/2020 1452 Gross per 24 hour  Intake 1000 ml  Output --  Net 1000 ml   There were no vitals filed for this visit.  Examination:  General exam: Appears calm and comfortable , nonverbal Respiratory system: coarse bs, no w/r Cardiovascular system: S1 & S2 heard, RRR. No JVD, murmurs, rubs, gallops or clicks. Gastrointestinal system: Abdomen is  nondistended, soft and nontender.  Normal bowel sounds heard. Central nervous system: difficult to assess, grossly intact Extremities: no edema Skin: warm, dry     Data Reviewed: I have personally reviewed following labs and imaging studies  CBC: Recent Labs  Lab 08/22/20 1133  WBC 10.6*  HGB 11.2*  HCT 34.6*  MCV 88.0  PLT 214   Basic Metabolic Panel: Recent Labs  Lab 08/22/20 1133  NA 139  K 3.6  CL 100  CO2 27  GLUCOSE 154*  BUN 13  CREATININE 1.17  CALCIUM 8.8*   GFR: CrCl cannot be calculated (Unknown ideal weight.). Liver Function Tests: No results for input(s): AST, ALT, ALKPHOS, BILITOT, PROT, ALBUMIN in the last 168 hours. Recent Labs  Lab 08/22/20 1133  LIPASE 23   No results for input(s): AMMONIA in the last 168 hours. Coagulation Profile: No results for input(s): INR, PROTIME in the last 168 hours. Cardiac Enzymes: No results for input(s): CKTOTAL, CKMB, CKMBINDEX, TROPONINI in the last 168 hours. BNP (last 3 results) No results for input(s): PROBNP in the last 8760 hours. HbA1C: Recent Labs    08/22/20 1133  HGBA1C 7.0*   CBG: Recent Labs  Lab 08/22/20 1713 08/23/20 0715  GLUCAP 114* 159*   Lipid Profile: No results for input(s): CHOL, HDL, LDLCALC, TRIG, CHOLHDL, LDLDIRECT in the last 72 hours. Thyroid Function Tests: No results for input(s): TSH, T4TOTAL, FREET4, T3FREE, THYROIDAB in the last 72 hours. Anemia Panel: No results for input(s): VITAMINB12, FOLATE, FERRITIN, TIBC, IRON, RETICCTPCT in  the last 72 hours. Sepsis Labs: Recent Labs  Lab 08/22/20 1324  LATICACIDVEN 0.7    Recent Results (from the past 240 hour(s))  Culture, blood (routine x 2)     Status: None (Preliminary result)   Collection Time: 08/22/20 12:31 PM   Specimen: BLOOD  Result Value Ref Range Status   Specimen Description BLOOD SITE NOT SPECIFIED  Final   Special Requests   Final    BOTTLES DRAWN AEROBIC AND ANAEROBIC Blood Culture results may not  be optimal due to an excessive volume of blood received in culture bottles   Culture   Final    NO GROWTH < 24 HOURS Performed at [facility], [address], [place], [place] [address]    Report Status PENDING  Incomplete  Culture, blood (routine x 2)     Status: None (Preliminary result)   Collection Time: 08/22/20 12:31 PM   Specimen: BLOOD  Result Value Ref Range Status   Specimen Description BLOOD SITE NOT SPECIFIED  Final   Special Requests   Final    BOTTLES DRAWN AEROBIC AND ANAEROBIC Blood Culture results may not be optimal due to an excessive volume of blood received in culture bottles   Culture   Final    NO GROWTH < 24 HOURS Performed at [facility], [address], [place], [place] [address]    Report Status PENDING  Incomplete         Radiology Studies: CT Head Wo Contrast  Result Date: 08/22/2020 CLINICAL DATA:  Mental status change EXAM: CT HEAD WITHOUT CONTRAST TECHNIQUE: Contiguous axial images were obtained from the base of the skull through the vertex without intravenous contrast. COMPARISON:  08/07/2020 FINDINGS: Motion artifact is present on several slices. Brain: There is no acute intracranial hemorrhage, mass effect, or edema. Gray-white differentiation is preserved within the above limitation. There is no extra-axial fluid collection. Patchy hypoattenuation in the supratentorial white matter is nonspecific but likely reflects stable chronic microvascular ischemic changes. Small chronic right cerebellar infarct. Ventricles and sulci are stable in size and configuration. Vascular: There is atherosclerotic calcification at the skull base. Skull: Calvarium is unremarkable. Sinuses/Orbits: No acute finding.  Stable appearance of the orbits. Other: None. IMPRESSION: Motion degraded study. No acute intracranial abnormality or significant change since recent prior study. Electronically Signed   By: Guadlupe Spanish M.D.   On: 08/22/2020 14:00   US  RENAL  Result Date: 08/22/2020 CLINICAL DATA:  Urinary tract infection EXAM: RENAL / URINARY TRACT ULTRASOUND COMPLETE COMPARISON:  Renal ultrasound October 22, 2011 FINDINGS: Right Kidney: Renal measurements: 13.4 x 5.3 x 5.1 cm = volume: 188 mL. Echogenicity within normal limits. No mass or hydronephrosis visualized. Left Kidney: Renal measurements: 13.6 x 6.5 x 6.2 cm = volume: 286 mL. Echogenicity within normal limits. No mass or hydronephrosis visualized. Bladder: Appears normal for degree of bladder distention. Other: Prominent prostate measuring 5.5 x 4.8 x 4.5 cm (volume = 62 cm^3) IMPRESSION: No significant sonographic abnormality of the kidneys or bladder. Electronically Signed   By: Maudry Mayhew MD   On: 08/22/2020 16:38   DG Chest Port 1 View  Result Date: 08/22/2020 CLINICAL DATA:  Fever and altered level of consciousness. EXAM: PORTABLE CHEST 1 VIEW COMPARISON:  Single-view of the chest 08/07/2020 and PA and lateral chest 12/21/2017. FINDINGS: Very mild bibasilar atelectasis process, pneumothorax. No consolidative or effusion. Lung volumes are somewhat low. Heart size is normal. No acute or focal bony abnormality. IMPRESSION: No  acute disease. Electronically Signed   By: Drusilla Kanner M.D.   On: 08/22/2020 11:59        Scheduled Meds: . enoxaparin (LOVENOX) injection  40 mg Subcutaneous Q24H  . insulin aspart  0-9 Units Subcutaneous TID WC  . metroNIDAZOLE  500 mg Oral Q8H  . tamsulosin  0.4 mg Oral QPC supper   Continuous Infusions:  Assessment & Plan:   Active Problems:   Encephalopathy   Encephalopathy -Etiology appears to be acute on chronic, acute event likely aspiration pneumonia plus minus a recurrent UTI.  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 12/2-per wife today, overall he is deteriorating .wife would like to meet with palliative care  We will treat infection with IV antibiotics  Placed on  fall precautions  Acute hypoxic respite failure/possible aspiration pneumonia And aspiration event prior to admission Was given 1 dose of ceftriaxone but was not continued? Will d/c flagyl, and start unasyn to cover for both asp pna and uti. ucx from 11/16 sensitive to this abx . F/u ucx, bcx Speech evalve-mild aspiration risk.  Recommended diet is regular with thin liquids  Recurrent UTI -renal US with prominent prostate  Has history of BPH but he refuses to take meds per wife  2 weeks ago with Klebsiella pneumonia pansensitive  IV Unasyn  Follow-up urine culture    Frontotemporal dementia -Continue memantine  We will need to follow-up with neurology as outpatient.  Program is loss follow-up with neurologist as early this year   IIDM Blood glucose stable Continue R-ISS  HTN Stable continue current meds   DVT prophylaxis: Enoxaparin Code Status: DNR Family Communication: Wife at bedside  Status is: Inpatient  Remains inpatient appropriate because:IV treatments appropriate due to intensity of illness or inability to take PO   Dispo: The patient is from: Home              Anticipated d/c is to: TBD              Anticipated d/c date is: 2 days              Patient currently is not medically stable to d/c.PT/OT            LOS: 1 day   Time spent: 35 minutes with more than 50% on COC    Lynn Ito, MD [facility] Pager [phone]  If 7PM-7AM, please contact night-coverage [email or web address] Password [email or web address] 08/23/2020, 9:09 AM

## 2020-08-23 NOTE — Progress Notes (Signed)
Pharmacy Antibiotic Note  Daniel Morrow is a 79 y.o. male admitted on 08/22/2020 with aspiration PNA and UTI.  Pharmacy has been consulted for unasyn dosing.  Plan: Unasyn 3gm IV q6h Monitor clinical course, and length of therapy  Height: 5\' 11"  (180.3 cm) Weight: 74.5 kg (164 lb 3.9 oz) IBW/kg (Calculated) : 75.3  Temp (24hrs), Avg:98.3 F (36.8 C), Min:97.9 F (36.6 C), Max:98.5 F (36.9 C)  Recent Labs  Lab 08/22/20 1133 08/22/20 1324 08/23/20 1015  WBC 10.6*  --  6.8  CREATININE 1.17  --   --   LATICACIDVEN  --  0.7  --     Estimated Creatinine Clearance: 55.7 mL/min (by C-G formula based on SCr of 1.17 mg/dL).    No Known Allergies  Antimicrobials this admission: 12/1 Ceftriaxone >> x 1 12/1 Flagyl >> 12/2 12/2 Unasyn>>   Taliana Mersereau A. 14/2, PharmD, BCPS, FNKF Clinical Pharmacist [place] Please utilize Amion for appropriate phone number to reach the unit pharmacist [facility])   08/23/2020 3:53 PM

## 2020-08-23 NOTE — ED Notes (Signed)
Report given to Marg, RN.

## 2020-08-23 NOTE — ED Notes (Signed)
Attempted report x1. 

## 2020-08-24 DIAGNOSIS — G3109 Other frontotemporal dementia: Secondary | ICD-10-CM

## 2020-08-24 DIAGNOSIS — F0280 Dementia in other diseases classified elsewhere without behavioral disturbance: Secondary | ICD-10-CM

## 2020-08-24 DIAGNOSIS — Z9189 Other specified personal risk factors, not elsewhere classified: Secondary | ICD-10-CM

## 2020-08-24 DIAGNOSIS — Z7189 Other specified counseling: Secondary | ICD-10-CM

## 2020-08-24 DIAGNOSIS — Z515 Encounter for palliative care: Secondary | ICD-10-CM

## 2020-08-24 DIAGNOSIS — R4182 Altered mental status, unspecified: Secondary | ICD-10-CM

## 2020-08-24 DIAGNOSIS — R400 Somnolence: Secondary | ICD-10-CM

## 2020-08-24 DIAGNOSIS — Z66 Do not resuscitate: Secondary | ICD-10-CM

## 2020-08-24 DIAGNOSIS — N390 Urinary tract infection, site not specified: Secondary | ICD-10-CM

## 2020-08-24 DIAGNOSIS — N3000 Acute cystitis without hematuria: Secondary | ICD-10-CM

## 2020-08-24 LAB — GLUCOSE, CAPILLARY
Glucose-Capillary: 178 mg/dL — ABNORMAL HIGH (ref 70–99)
Glucose-Capillary: 256 mg/dL — ABNORMAL HIGH (ref 70–99)
Glucose-Capillary: 193 mg/dL — ABNORMAL HIGH (ref 70–99)
Glucose-Capillary: 206 mg/dL — ABNORMAL HIGH (ref 70–99)

## 2020-08-24 MED ORDER — SERTRALINE HCL 50 MG PO TABS
150.0000 mg | ORAL_TABLET | Freq: Every day | ORAL | Status: DC
  Administered 2020-08-24 – 2020-08-25 (×2): 150 mg via ORAL
  Filled 2020-08-24 (×2): qty 1

## 2020-08-24 NOTE — Consult Note (Signed)
Consultation Note Date: 08/24/2020   Patient Name: Daniel Morrow  DOB: 07-11-1941  MRN: [id number]  Age / Sex: 79 y.o., male  PCP: Lavone Orn, MD Referring Physician: Nolberto Hanlon, MD  Reason for Consultation: Establishing goals of care  HPI/Patient Profile: 79 y.o. male  with past medical history of frontotemporal dementia, recurrent UTI's, BPH noncompliant with medications, HTN, DM type 2, depression, anxiety admitted on 08/22/2020 with altered mental status. Wife reports worsening mentation and functional status in the last 4 weeks with multiple falls. Wife reports several UTI's this year after he stopped taking BPH medication. Hospital admission for acute hypoxic respiratory failure secondary to possible aspiration pneumonia, UTI, and acute on chronic encephalopathy with baseline dementia. Palliative medicine consultation for goals of care.   Clinical Assessment and Goals of Care:  I have reviewed medical records, discussed with Dr. Kurtis Bushman and RN, and met with [name]) and daughter Abigail Butts) in family conference room. Daughter, Pam on speaker phone.    SLP at bedside during visit. Patient is drowsy but accepting bites of banana. He is baseline nonverbal and does not follow commands during visit. Per RN, he did require dose of ativan last night. He appears comfortable without s/s of pain or distress.  I introduced Palliative Medicine as specialized medical care for people living with serious illness. It focuses on providing relief from the symptoms and stress of a serious illness. The goal is to improve quality of life for both the patient and the family.  We discussed a brief life review of the patient. Married to Charlotte for 59 this December. They have three daughters. Siddh suffered a stroke in 2013. 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 Family reports dementia  diagnosis a few years ago and followed by Dr. Carles Collet, outpatient neurology. Prior to admission, baseline non-verbal but was able to ambulate short distances with a walker and able to feed himself. Wife reports recurrent UTI's and that he often refuses medication for BPH.   Discussed in detail events leading up to admission and course of hospitalization including diagnoses, interventions, plan of care. Discussed trajectory and expectations of progressive dementia.   I attempted to elicit values and goals of care important to the patient and family. Wife and daughters acknowledge their understanding that his dementia is progressing and his quality of life is poor. With his current poor functional status, it will be very challenging for Trudy to take him home following this admission.   Advanced directives, concepts specific to code status, artifical feeding and hydration, and rehospitalization were considered and discussed. Patient has a documented living will with [name] and daughters as POA's. Family confirms DNR code status and that they would not wish for him to be on life support measures as his condition progresses.   Introduced outpatient palliative versus outpatient hospice options and philosophy, if goals are aimed at comfort/quality of life and keeping him out of the hospital.   Again, wife cannot care for him at home. Family asks  about SNF rehab. We discussed PT/OT consults inpatient and evaluate his eligibility for SNF rehab. Family open to further hospice discussions and initiation if he does not do well at rehab. He is already involved in care connections through [place].   MOST form completed with wife and two daughters. Decisions include: DNR/DNI, limited additional interventions including rehospitalization if necessary, determine use or limitation of ABX when infections occurs, IVF for time trial, and NO feeding tube. Electronic Vynca MOST completed. Durable DNR completed.  Copies made for chart and family.   Reviewed plan: medical management, ongoing PT/OT/SLP efforts, and hopeful for SNF rehab with ongoing palliative follow-up.   Questions and concerns were addressed.  Hard Choices booklet left for review. PMT contact information given.    SUMMARY OF RECOMMENDATIONS    Patient has a documented living will/HCPOA scanned into EMR. [place] discussion with wife and two daughters.  MOST form completed. Decisions include: DNR/DNI, limited additional interventions including rehospitalization if necessary, CPAP/BiPAP if indicated, determine use or limitation of ABX when infection occurs, IVF for time trial, and NO feeding tube. Durable DNR completed. Electronic Vynca MOST completed.   Continue current plan of care and medical management.  PT/OT evaluations. Continue SLP follow-up.   Family hopeful for SNF rehab disposition. Currently enrolled in outpatient palliative through [facility] care connections. Updated liaison. They will continue to follow.   Family open to further GOC/hospice discussions pending his ability to progress with rehab.   Code Status/Advance Care Planning:  DNR  Symptom Management:   Consider low dose Seroquel or Zyprexa for dementia with agitation  Palliative Prophylaxis:   Aspiration, Bowel Regimen, Delirium Protocol, Frequent Pain Assessment, Oral Care and Turn Reposition   Psycho-social/Spiritual:   Desire for further Chaplaincy support: yes  Additional Recommendations: Caregiving  Support/Resources, Compassionate Wean Education and Education on Hospice  Prognosis:   Poor long-term with progressive frontotemporal dementia and declining functional/cognitive/nutritional status.   Discharge Planning: [place] for rehab with Palliative care service follow-up      Primary Diagnoses: Present on Admission: . Encephalopathy   I have reviewed the medical record, interviewed the patient and family, and examined the  patient. The following aspects are pertinent.  Past Medical History:  Diagnosis Date  . Aphagia   . Arthritis    "knees" (09/14/2018)  . Blind left eye   . Chronic bronchitis ([place])   . Dementia ([place])   . Depression   . GERD (gastroesophageal reflux disease)   . Glaucoma   . History of kidney stones   . Hyperlipidemia   . Hypertension   . Pneumonia    "maybe twice" (09/14/2018)  . Recurrent UTI (urinary tract infection)   . Stroke [facility]) 11/2011   "didn't effect him at the time" (09/14/2018)  . Type II diabetes mellitus ([place])    Social History   Socioeconomic History  . Marital status: Married    Spouse name: [name]  . Number of children: 3  . Years of education: College  . Highest education level: Not on file  Occupational History  . Occupation: Retired  Tobacco Use  . Smoking status: Former Smoker    Packs/day: 1.00    Years: 20.00    Pack years: 20.00    Types: Cigarettes    Quit date: 1980    Years since quitting: 41.9  . Smokeless tobacco: Former Systems developer    Quit date: 1985  Vaping Use  . Vaping Use: Never used  Substance and Sexual Activity  .  Alcohol use: No  . Drug use: Never  . Sexual activity: Not on file  Other Topics Concern  . Not on file  Social History Narrative   Patient lives at home with family.   Caffeine Use: 2 cups daily   Social Determinants of Health   Financial Resource Strain:   . Difficulty of Paying Living Expenses: Not on file  Food Insecurity:   . Worried About Charity fundraiser in the Last Year: Not on file  . Ran Out of Food in the Last Year: Not on file  Transportation Needs:   . Lack of Transportation (Medical): Not on file  . Lack of Transportation (Non-Medical): Not on file  Physical Activity:   . Days of Exercise per Week: Not on file  . Minutes of Exercise per Session: Not on file  Stress:   . Feeling of Stress : Not on file  Social Connections:   . Frequency of Communication with Friends and Family: Not on file   . Frequency of Social Gatherings with Friends and Family: Not on file  . Attends Religious Services: Not on file  . Active Member of Clubs or Organizations: Not on file  . Attends Archivist Meetings: Not on file  . Marital Status: Not on file   Family History  Problem Relation Age of Onset  . Polycythemia Mother   . Lung cancer Father   . Other Brother        brain tumor  . Diabetes Daughter   . Colon cancer Neg Hx   . Esophageal cancer Neg Hx   . Rectal cancer Neg Hx   . Stomach cancer Neg Hx    Scheduled Meds: . enoxaparin (LOVENOX) injection  40 mg Subcutaneous Q24H  . insulin aspart  0-9 Units Subcutaneous TID WC  . sertraline  150 mg Oral Daily  . tamsulosin  0.4 mg Oral QPC supper   Continuous Infusions: . sodium chloride 50 mL/hr at 08/24/20 1019  . ampicillin-sulbactam (UNASYN) IV 3 g (08/24/20 0650)   PRN Meds:.acetaminophen **OR** acetaminophen Medications Prior to Admission:  Prior to Admission medications   Medication Sig Start Date End Date Taking? Authorizing Provider  acetaminophen (TYLENOL) 325 MG tablet Take 650 mg by mouth every 6 (six) hours as needed for mild pain, fever or headache.   Yes [provider]  amLODipine (NORVASC) 10 MG tablet Take 1 tablet (10 mg total) by mouth daily. 10/05/19 10/04/20 Yes Adhikari, Tamsen Meek, MD  benazepril (LOTENSIN) 20 MG tablet Take 20 mg by mouth daily.    Yes [provider]  brimonidine (ALPHAGAN) 0.2 % ophthalmic solution Place 1 drop into the right eye 2 (two) times daily.  02/25/18  Yes [provider]  colesevelam (WELCHOL) 625 MG tablet Take 1,875 mg by mouth 2 (two) times daily with a meal.  12/22/18  Yes [provider]  dipyridamole-aspirin (AGGRENOX) 200-25 MG 12hr capsule Take 1 capsule by mouth 2 (two) times daily.   Yes [provider]  glimepiride (AMARYL) 4 MG tablet Take 4 mg by mouth daily.  06/12/16  Yes [provider]  latanoprost (XALATAN) 0.005  % ophthalmic solution Place 1 drop into the right eye at bedtime.  06/02/16  Yes [provider]  memantine (NAMENDA) 10 MG tablet Take 1 tablet (10 mg total) by mouth 2 (two) times daily. 08/24/18  Yes Sethi, Lucy Antigua, MD  OZEMPIC, 1 MG/DOSE, 4 MG/3ML SOPN Inject 1 mg into the skin once a week.  Thursday 07/30/20  Yes [provider]  pantoprazole (PROTONIX) 20 MG tablet Take 20 mg by mouth 2 (two) times daily.    Yes [provider]  sertraline (ZOLOFT) 100 MG tablet Take 1 tablet (100 mg total) by mouth daily. Patient taking differently: Take 150 mg by mouth daily.  03/02/17  Yes Garvin Fila, MD  timolol (TIMOPTIC) 0.5 % ophthalmic solution Place 1 drop into the right eye 2 (two) times daily.   Yes [provider]  traZODone (DESYREL) 100 MG tablet Take 100 mg by mouth at bedtime. 07/23/20  Yes [provider]  vitamin B-12 (CYANOCOBALAMIN) 1000 MCG tablet Take 1,000 mcg by mouth daily.   Yes [provider]  sucralfate (CARAFATE) 1 GM/10ML suspension Take 10 mLs (1 g total) by mouth 4 (four) times daily. Patient not taking: Reported on 11/15/2019 10/04/19 04/14/20  Cirigliano, Luanna Salk V, DO   No Known Allergies Review of Systems  Unable to perform ROS: Dementia    Physical Exam Vitals and nursing note reviewed.  Constitutional:      Appearance: He is ill-appearing.     Comments: drowsy  Cardiovascular:     Heart sounds: Normal heart sounds.  Pulmonary:     Effort: No tachypnea, accessory muscle usage or respiratory distress.     Breath sounds: Normal breath sounds.  Abdominal:     General: Bowel sounds are normal.     Tenderness: There is no abdominal tenderness.  Skin:    General: Skin is warm and dry.  Neurological:     Comments: Drowsy, nonverbal. Does not follow commands  Psychiatric:        Attention and Perception: He is inattentive.        Speech: He is noncommunicative.        Cognition and Memory: Cognition is impaired.     Vital Signs: BP (!) 170/87 (BP Location: Right Arm)   Pulse 90   Temp 98.9 F (37.2 C) (Axillary)   Resp 18   Ht _0  (1.803 m)   Wt 74.5 kg   SpO2 93%   BMI 22.91 kg/m  Pain Scale: PAINAD   Pain Score: 0-No pain   SpO2: SpO2: 93 % O2 Device:SpO2: 93 % O2 Flow Rate: .O2 Flow Rate (L/min): 2 L/min  IO: Intake/output summary:   Intake/Output Summary (Last 24 hours) at 08/24/2020 1115 Last data filed at 08/24/2020 0900 Gross per 24 hour  Intake 2307.32 ml  Output 1200 ml  Net 1107.32 ml    LBM: Last BM Date:  (PTA) Baseline Weight: Weight: 74.5 kg Most recent weight: Weight: 74.5 kg     Palliative Assessment/Data: PPS 30%   Flowsheet Rows     Most Recent Value  Intake Tab  Referral Department Hospitalist  Unit at Time of Referral Med/Surg Unit  Palliative Care Primary Diagnosis Neurology  Palliative Care Type New Palliative care  Reason for referral Clarify Goals of Care  Date first seen by Palliative Care 08/24/20  Clinical Assessment  Palliative Performance Scale Score 30%  Psychosocial & Spiritual Assessment  Palliative Care Outcomes  Patient/Family meeting held? Yes  Who was at the meeting? wife and daughter in family waiting room, daughter on speaker  Palliative Care Outcomes Clarified goals of care, Counseled regarding hospice, Provided end of life care assistance, Improved pain interventions, Improved non-pain symptom therapy, Provided psychosocial or spiritual support, Provided advance care planning, Linked to palliative care logitudinal support, ACP counseling assistance      Time Total:  90 Greater than 50%  of this time was spent counseling and coordinating care related to the above assessment and plan.  Signed by:  Ihor Dow, DNP, FNP-C Palliative Medicine Team  Phone: [phone] Fax: [phone]   Please contact Palliative Medicine Team phone at [phone] for questions and concerns.  For individual provider: See  Shea Evans

## 2020-08-24 NOTE — Progress Notes (Addendum)
  Speech Language Pathology Treatment: Dysphagia  Patient Details Name: ANAY RATHE MRN: [id number] DOB: 01/20/1941 Today's Date: 08/24/2020 Time: 9381-8299 SLP Time Calculation (min) (ACUTE ONLY): 23 min  Assessment / Plan / Recommendation Clinical Impression  Pt very altered this am, was given ativan in the pm and now keeps his eyes closed but is restless with minimal awareness of his surroundings. His wife arrived at bedside and stated he was "out of it" but typically he is more alert with eyes open, can eat and drink. She reports he had vomiting after meds were given in bed, also reports "gurgling" in his throat during that night after vomiting. We discussed that pt has a history of GERD, cannot report symptoms of heartburn and that her report sounds consistent with a reflux event. We discussed reflux precautions and diet choices. For now, staff is struggling to safely feed pt given his waxing and waning mentation. Will downgrade pt to dys 1/nectar thick liquids for increased safety, though would not expect these modifications to be needed in the long term if mentation returns to his baseline state of dementia. He is not likely chronically aspirating as he appears to have adequate swallow mechanism, just decreased awareness today, and likely some GERD issues that should be addressed with upright sleeping position and avoiding acidic and fried foods at home. Will floow for potential diet upgrade when appropriate.    HPI HPI: Pt is a 79 y.o. male with medical history significant of frontotemporal dementia, recurrent UTIs, BPH noncompliant with medications, HTN, IIDM, chronic ambulatory dysfunction, depression anxiety, who presented with fever, hypoxia, and AMS. Per H&P, pt nonverbal at baseline and minimally responsive. Note from ED MD stated "He has history of prior stroke and is minimally verbal, however has been less vocal over the past 2 days." CT head: No acute intracranial abnormality. CXR was  negative for acute changes.       SLP Plan  Continue with current plan of care       Recommendations  Diet recommendations: Dysphagia 1 (puree);Nectar-thick liquid Liquids provided via: Teaspoon Medication Administration: Whole meds with puree Supervision: Full supervision/cueing for compensatory strategies Compensations: Slow rate;Small sips/bites;Minimize environmental distractions Postural Changes and/or Swallow Maneuvers: Seated upright 90 degrees                Follow up Recommendations: None SLP Visit Diagnosis: Dysphagia, unspecified (R13.10) Plan: Continue with current plan of care       GO                Jermya Dowding, Riley Nearing 08/24/2020, 9:30 AM

## 2020-08-24 NOTE — Plan of Care (Signed)
  Problem: Coping: Goal: Level of anxiety will decrease Outcome: Progressing   Problem: Safety: Goal: Ability to remain free from injury will improve Outcome: Progressing   Problem: Skin Integrity: Goal: Risk for impaired skin integrity will decrease Outcome: Progressing   

## 2020-08-24 NOTE — Progress Notes (Signed)
PROGRESS NOTE    AYDRIEN Morrow  ASU:[id number] DOB: 05-Dec-1940 DOA: 08/22/2020 PCP: Lavone Orn, MD    Brief Narrative:  Daniel Morrow is a 79 y.o. male with medical history significant of frontotemporal dementia, recurrent UTIs, BPH noncompliant with medications, HTN, IIDM, chronic ambulatory dysfunction, depression anxiety, presented with fever, hypoxia.  Patient nonverbal at baseline minimally responsive, most history provided by patient and wife at bedside.  Wife reported patient has had several episodes of UTI this year after stopped taking his BPH medications.  Each time when UTI happens, patient usually became less responsive.  Baseline mental status wise, patient has had worsening of being responsive to outside stimulus, and recent month his mentation has significantly deteriorated.  For last 4 weeks, patient has been bedbound and wheelchair-bound, unable to transfer himself.  Compared to before he was able to use a walker despite being very wobbly and has sustained multiple falls.  12/3-palliative meeting with family this am.  Consultants:   Palliative care  Procedures:   Antimicrobials:    flagyl -  Ceftriaxone 1 dose      Subjective: Sleepy, non verbal. No overnight issues  Objective: Vitals:   08/23/20 1948 08/24/20 0018 08/24/20 0425 08/24/20 0847  BP: (!) 148/79 140/73 (!) 154/86 (!) 170/87  Pulse: 100 91 94 90  Resp: _0 Temp: 98.3 F (36.8 C) 98.3 F (36.8 C) 98.7 F (37.1 C) 98.9 F (37.2 C)  TempSrc: Oral Oral Oral Axillary  SpO2: 92% 96%  93%  Weight:      Height:        Intake/Output Summary (Last 24 hours) at 08/24/2020 0854 Last data filed at 08/24/2020 4327 Gross per 24 hour  Intake 2257.32 ml  Output 1200 ml  Net 1057.32 ml   Filed Weights   08/23/20 0900  Weight: 74.5 kg    Examination: Calm, sleepy cta anteriorly Regular s1/s2 Soft benign, +bbs No edema Mood and affect appropriate in current  setting.     Data Reviewed: I have personally reviewed following labs and imaging studies  CBC: Recent Labs  Lab 08/22/20 1133 08/23/20 1015  WBC 10.6* 6.8  HGB 11.2* 11.0*  HCT 34.6* 34.8*  MCV 88.0 87.9  PLT 214 614   Basic Metabolic Panel: Recent Labs  Lab 08/22/20 1133  NA 139  K 3.6  CL 100  CO2 27  GLUCOSE 154*  BUN 13  CREATININE 1.17  CALCIUM 8.8*   GFR: Estimated Creatinine Clearance: 55.7 mL/min (by C-G formula based on SCr of 1.17 mg/dL). Liver Function Tests: No results for input(s): AST, ALT, ALKPHOS, BILITOT, PROT, ALBUMIN in the last 168 hours. Recent Labs  Lab 08/22/20 1133  LIPASE 23   No results for input(s): AMMONIA in the last 168 hours. Coagulation Profile: No results for input(s): INR, PROTIME in the last 168 hours. Cardiac Enzymes: No results for input(s): CKTOTAL, CKMB, CKMBINDEX, TROPONINI in the last 168 hours. BNP (last 3 results) No results for input(s): PROBNP in the last 8760 hours. HbA1C: Recent Labs    08/22/20 1133  HGBA1C 7.0*   CBG: Recent Labs  Lab 08/23/20 0715 08/23/20 1251 08/23/20 1622 08/23/20 1950 08/24/20 0610  GLUCAP 159* 167* 186* 214* 178*   Lipid Profile: No results for input(s): CHOL, HDL, LDLCALC, TRIG, CHOLHDL, LDLDIRECT in the last 72 hours. Thyroid Function Tests: No results for input(s): TSH, T4TOTAL, FREET4, T3FREE, THYROIDAB in the last 72 hours. Anemia Panel: No results for input(s): VITAMINB12, FOLATE, FERRITIN,  TIBC, IRON, RETICCTPCT in the last 72 hours. Sepsis Labs: Recent Labs  Lab 08/22/20 1324  LATICACIDVEN 0.7    Recent Results (from the past 240 hour(s))  Urine C&S     Status: None   Collection Time: 08/22/20 11:24 AM   Specimen: Urine, Catheterized  Result Value Ref Range Status   Specimen Description URINE, CATHETERIZED  Final   Special Requests Immunocompromised  Final   Culture   Final    NO GROWTH Performed at [facility], [address], [place],  [place] [address]    Report Status 08/23/2020 FINAL  Final  Culture, blood (routine x 2)     Status: None (Preliminary result)   Collection Time: 08/22/20 12:31 PM   Specimen: BLOOD  Result Value Ref Range Status   Specimen Description BLOOD SITE NOT SPECIFIED  Final   Special Requests   Final    BOTTLES DRAWN AEROBIC AND ANAEROBIC Blood Culture results may not be optimal due to an excessive volume of blood received in culture bottles   Culture   Final    NO GROWTH 2 DAYS Performed at [facility], [place] [address], [place], [place] [address]    Report Status PENDING  Incomplete  Culture, blood (routine x 2)     Status: None (Preliminary result)   Collection Time: 08/22/20 12:31 PM   Specimen: BLOOD  Result Value Ref Range Status   Specimen Description BLOOD SITE NOT SPECIFIED  Final   Special Requests   Final    BOTTLES DRAWN AEROBIC AND ANAEROBIC Blood Culture results may not be optimal due to an excessive volume of blood received in culture bottles   Culture   Final    NO GROWTH 2 DAYS Performed at [facility], [place] [address], [place], [place] [address]    Report Status PENDING  Incomplete         Radiology Studies: CT Head Wo Contrast  Result Date: 08/22/2020 CLINICAL DATA:  Mental status change EXAM: CT HEAD WITHOUT CONTRAST TECHNIQUE: Contiguous axial images were obtained from the base of the skull through the vertex without intravenous contrast. COMPARISON:  08/07/2020 FINDINGS: Motion artifact is present on several slices. Brain: There is no acute intracranial hemorrhage, mass effect, or edema. Gray-white differentiation is preserved within the above limitation. There is no extra-axial fluid collection. Patchy hypoattenuation in the supratentorial white matter is nonspecific but likely reflects stable chronic microvascular ischemic changes. Small chronic right cerebellar infarct. Ventricles and sulci are stable in size and configuration. Vascular: There is  atherosclerotic calcification at the skull base. Skull: Calvarium is unremarkable. Sinuses/Orbits: No acute finding.  Stable appearance of the orbits. Other: None. IMPRESSION: Motion degraded study. No acute intracranial abnormality or significant change since recent prior study. Electronically Signed   By: Macy Mis M.D.   On: 08/22/2020 14:00   US RENAL  Result Date: 08/22/2020 CLINICAL DATA:  Urinary tract infection EXAM: RENAL / URINARY TRACT ULTRASOUND COMPLETE COMPARISON:  Renal ultrasound October 22, 2011 FINDINGS: Right Kidney: Renal measurements: 13.4 x 5.3 x 5.1 cm = volume: 188 mL. Echogenicity within normal limits. No mass or hydronephrosis visualized. Left Kidney: Renal measurements: 13.6 x 6.5 x 6.2 cm = volume: 286 mL. Echogenicity within normal limits. No mass or hydronephrosis visualized. Bladder: Appears normal for degree of bladder distention. Other: Prominent prostate measuring 5.5 x 4.8 x 4.5 cm (volume = 62 cm^3) IMPRESSION: No significant sonographic abnormality of the kidneys or bladder. Electronically Signed  By: Dahlia Bailiff MD   On: 08/22/2020 16:38   DG Chest Port 1 View  Result Date: 08/22/2020 CLINICAL DATA:  Fever and altered level of consciousness. EXAM: PORTABLE CHEST 1 VIEW COMPARISON:  Single-view of the chest 08/07/2020 and PA and lateral chest 12/21/2017. FINDINGS: Very mild bibasilar atelectasis process, pneumothorax. No consolidative or effusion. Lung volumes are somewhat low. Heart size is normal. No acute or focal bony abnormality. IMPRESSION: No acute disease. Electronically Signed   By: Inge Rise M.D.   On: 08/22/2020 11:59        Scheduled Meds: . enoxaparin (LOVENOX) injection  40 mg Subcutaneous Q24H  . insulin aspart  0-9 Units Subcutaneous TID WC  . tamsulosin  0.4 mg Oral QPC supper   Continuous Infusions: . sodium chloride 50 mL/hr at 08/24/20 0447  . ampicillin-sulbactam (UNASYN) IV 3 g (08/24/20 0650)    Assessment & Plan:    Active Problems:   Encephalopathy   Encephalopathy -Etiology appears to be acute on chronic, acute event likely aspiration pneumonia plus minus a recurrent UTI.  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 12/2-per wife today, overall he is deteriorating .wife would like to meet with palliative care  12/3- hard to tell if improving.  Will continue with tx of infection with iv abx. Palliative care met with family, unable to take care of pt at home. Will get PT/OT DNR Family open to further hospice discussions pending his ability to progress with rehab Fall precautions  Acute hypoxic respite failure/possible aspiration pneumonia And aspiration event prior to admission On Unasyn  Placed on dysph. 1 diet as pt at mild risk of aspiration per speech.   Recurrent UTI -renal US with prominent prostate  Has history of BPH but he refuses to take meds per wife  2 weeks ago with Klebsiella pneumonia pansensitive  12/3-cx pending Continue with iv unasyn    Frontotemporal dementia -Continue memantine  Is to follow-up with neurology as outpatient.  Pt was lost follow-up with neurologist as early this year     IIDM Blood glucose stable Continue R-ISS  HTN Stable continue current meds   DVT prophylaxis: Enoxaparin Code Status: DNR Family Communication: Wife at bedside  Status is: Inpatient  Remains inpatient appropriate because:IV treatments appropriate due to intensity of illness or inability to take PO   Dispo: The patient is from: Home              Anticipated d/c is to: TBD              Anticipated d/c date is: 2 days              Patient currently is not medically stable to d/c.PT/OT            LOS: 2 days   Time spent: 35 minutes with more than 50% on [place], MD [facility] Pager [phone]  If 7PM-7AM, please contact night-coverage [email or web address] Password  [facility] 08/24/2020, 8:54 AM

## 2020-08-25 LAB — GLUCOSE, CAPILLARY
Glucose-Capillary: 174 mg/dL — ABNORMAL HIGH (ref 70–99)
Glucose-Capillary: 183 mg/dL — ABNORMAL HIGH (ref 70–99)
Glucose-Capillary: 310 mg/dL — ABNORMAL HIGH (ref 70–99)
Glucose-Capillary: 355 mg/dL — ABNORMAL HIGH (ref 70–99)

## 2020-08-25 MED ORDER — LISINOPRIL 2.5 MG PO TABS
2.5000 mg | ORAL_TABLET | Freq: Every day | ORAL | Status: DC
  Administered 2020-08-25: 2.5 mg via ORAL
  Filled 2020-08-25: qty 1

## 2020-08-25 MED ORDER — LATANOPROST 0.005 % OP SOLN
1.0000 [drp] | Freq: Every day | OPHTHALMIC | Status: DC
  Administered 2020-08-25 – 2020-09-05 (×12): 1 [drp] via OPHTHALMIC
  Filled 2020-08-25: qty 2.5

## 2020-08-25 MED ORDER — BRIMONIDINE TARTRATE 0.2 % OP SOLN
1.0000 [drp] | Freq: Two times a day (BID) | OPHTHALMIC | Status: DC
  Administered 2020-08-25 – 2020-09-06 (×24): 1 [drp] via OPHTHALMIC
  Filled 2020-08-25: qty 5

## 2020-08-25 MED ORDER — RESOURCE THICKENUP CLEAR PO POWD
ORAL | Status: DC | PRN
  Filled 2020-08-25: qty 125

## 2020-08-25 MED ORDER — LORAZEPAM 0.5 MG PO TABS
0.2500 mg | ORAL_TABLET | Freq: Once | ORAL | Status: AC
  Administered 2020-08-25: 0.25 mg via ORAL
  Filled 2020-08-25: qty 1

## 2020-08-25 MED ORDER — SERTRALINE HCL 50 MG PO TABS
150.0000 mg | ORAL_TABLET | Freq: Every day | ORAL | Status: DC
  Administered 2020-08-26 – 2020-09-06 (×12): 150 mg via ORAL
  Filled 2020-08-25 (×12): qty 1

## 2020-08-25 MED ORDER — AMLODIPINE BESYLATE 10 MG PO TABS
10.0000 mg | ORAL_TABLET | Freq: Every day | ORAL | Status: DC
  Administered 2020-08-25 – 2020-09-05 (×12): 10 mg via ORAL
  Filled 2020-08-25 (×13): qty 1

## 2020-08-25 MED ORDER — LISINOPRIL 2.5 MG PO TABS
2.5000 mg | ORAL_TABLET | Freq: Two times a day (BID) | ORAL | Status: DC
  Administered 2020-08-25 – 2020-09-05 (×23): 2.5 mg via ORAL
  Filled 2020-08-25 (×24): qty 1

## 2020-08-25 MED ORDER — TIMOLOL MALEATE 0.5 % OP SOLN
1.0000 [drp] | Freq: Two times a day (BID) | OPHTHALMIC | Status: DC
  Administered 2020-08-25 – 2020-09-06 (×24): 1 [drp] via OPHTHALMIC
  Filled 2020-08-25 (×2): qty 5

## 2020-08-25 NOTE — Progress Notes (Signed)
Pt kept on trying to get out of bed multiple times during shift but ativan ordered by MD was effective and pt remained in bed afterwards. Reported off to oncoming RN. Dionne Bucy RN

## 2020-08-25 NOTE — Evaluation (Signed)
Physical Therapy Evaluation Patient Details Name: Daniel Morrow MRN: [id number] DOB: [birth year] Today's Date: 08/25/2020   History of Present Illness  79 y.o. male  with past medical history of frontotemporal dementia, recurrent UTI's, BPH noncompliant with medications, HTN, DM type 2, depression, anxiety admitted on 08/22/2020 with altered mental status. Pt experiencing multiple falls over the last few weeks. Pt found to have aspiration PNA, UTI, and acute on chronic encephalopathy.  Clinical Impression  Pt presents to PT with deficits in functional mobility, gait, balance, strength, power, and cognition. Pt demonstrates improved command following with visual and tactile cues to initiate mobility during eval. Pt has a preference for a posterior lean during all standing activity, and has difficulty coordinating LEs during gait, resulting in intermittent scissoring and narrowed base of support. Pt will benefit from acute PT POC to improve mobility quality and to reduce falls risk. PT recommends SNF placement at this time.    Follow Up Recommendations SNF;Supervision/Assistance - 24 hour    Equipment Recommendations  Wheelchair (measurements PT)    Recommendations for Other Services       Precautions / Restrictions Precautions Precautions: Fall Restrictions Weight Bearing Restrictions: No      Mobility  Bed Mobility Overal bed mobility: Needs Assistance Bed Mobility: Supine to Sit;Sit to Supine     Supine to sit: Mod assist Sit to supine: Mod assist        Transfers Overall transfer level: Needs assistance Equipment used: Rolling walker (2 wheeled);1 person hand held assist Transfers: Sit to/from Stand Sit to Stand: Mod assist            Ambulation/Gait Ambulation/Gait assistance: Max assist Gait Distance (Feet): 2 Feet Assistive device: Rolling walker (2 wheeled) Gait Pattern/deviations: Step-to pattern;Scissoring Gait velocity: reduced Gait velocity  interpretation: <1.31 ft/sec, indicative of household ambulator General Gait Details: pt with short step-to gait, narrowed BOS, difficulty mobilizing LLE at times when ambulating. Pt takes 2 steps forward with PT tactile cues and anterior weight shift  Stairs            Wheelchair Mobility    Modified Rankin (Stroke Patients Only)       Balance Overall balance assessment: Needs assistance Sitting-balance support: No upper extremity supported;Feet supported Sitting balance-Leahy Scale: Fair     Standing balance support: Single extremity supported;Bilateral upper extremity supported Standing balance-Leahy Scale: Poor Standing balance comment: reliant on UE support and minA with use of RW                             Pertinent Vitals/Pain Pain Assessment: Faces Faces Pain Scale: No hurt    Home Living Family/patient expects to be discharged to:: Skilled nursing facility                 Additional Comments: pt lives at home with spouse, one level home    Prior Function Level of Independence: Needs assistance   Gait / Transfers Assistance Needed: pt ambulates limited household distances with minG and use of RW, has recent history of falls  ADL's / Homemaking Assistance Needed: requires assistance with ADLs, was able to feed himself until recently        Hand Dominance        Extremity/Trunk Assessment   Upper Extremity Assessment Upper Extremity Assessment: Generalized weakness    Lower Extremity Assessment Lower Extremity Assessment: Generalized weakness    Cervical / Trunk Assessment Cervical / Trunk Assessment: Kyphotic  Communication   Communication: Expressive difficulties;HOH  Cognition Arousal/Alertness: Awake/alert Behavior During Therapy: Flat affect Overall Cognitive Status: History of cognitive impairments - at baseline                                 General Comments: pt with history of dementia, nonverbal  during session which limits cognitive assessment. Pt follows one step commands with increased time, needing visual or tactile cues      General Comments General comments (skin integrity, edema, etc.): VSS on RA    Exercises     Assessment/Plan    PT Assessment Patient needs continued PT services  PT Problem List Decreased strength;Decreased activity tolerance;Decreased balance;Decreased mobility;Decreased cognition;Decreased knowledge of use of DME;Decreased safety awareness;Decreased knowledge of precautions       PT Treatment Interventions DME instruction;Gait training;Functional mobility training;Therapeutic activities;Therapeutic exercise;Balance training;Neuromuscular re-education;Cognitive remediation;Patient/family education;Wheelchair mobility training    PT Goals (Current goals can be found in the Care Plan section)  Acute Rehab PT Goals Patient Stated Goal: To go to rehab and improve mobility PT Goal Formulation: With family Time For Goal Achievement: 09/08/20 Potential to Achieve Goals: Fair    Frequency Min 2X/week   Barriers to discharge        Co-evaluation               AM-PAC PT "6 Clicks" Mobility  Outcome Measure Help needed turning from your back to your side while in a flat bed without using bedrails?: A Little Help needed moving from lying on your back to sitting on the side of a flat bed without using bedrails?: A Lot Help needed moving to and from a bed to a chair (including a wheelchair)?: A Lot Help needed standing up from a chair using your arms (e.g., wheelchair or bedside chair)?: A Lot Help needed to walk in hospital room?: A Lot Help needed climbing 3-5 steps with a railing? : Total 6 Click Score: 12    End of Session Equipment Utilized During Treatment: Gait belt Activity Tolerance: Patient tolerated treatment well Patient left: in bed;with call bell/phone within reach;with bed alarm set;with family/visitor present Nurse  Communication: Mobility status PT Visit Diagnosis: Unsteadiness on feet (R26.81);Other abnormalities of gait and mobility (R26.89)    Time: 5643-3295 PT Time Calculation (min) (ACUTE ONLY): 19 min   Charges:   PT Evaluation $PT Eval Moderate Complexity: 1 Mod          Arlyss Gandy, PT, DPT Acute Rehabilitation Pager: [phone]   Arlyss Gandy 08/25/2020, 12:49 PM

## 2020-08-25 NOTE — Progress Notes (Signed)
PROGRESS NOTE    KSEAN VALE  GYK:[id number] DOB: 12-May-1941 DOA: 08/22/2020 PCP: Lavone Orn, MD    Brief Narrative:  SOTIRIOS NAVARRO is a 79 y.o. male with medical history significant of frontotemporal dementia, recurrent UTIs, BPH noncompliant with medications, HTN, IIDM, chronic ambulatory dysfunction, depression anxiety, presented with fever, hypoxia.  Patient nonverbal at baseline minimally responsive, most history provided by patient and wife at bedside.  Wife reported patient has had several episodes of UTI this year after stopped taking his BPH medications.  Each time when UTI happens, patient usually became less responsive.  Baseline mental status wise, patient has had worsening of being responsive to outside stimulus, and recent month his mentation has significantly deteriorated.  For last 4 weeks, patient has been bedbound and wheelchair-bound, unable to transfer himself.  Compared to before he was able to use a walker despite being very wobbly and has sustained multiple falls.  12/3-palliative meeting with family -see note 12/4-PT rec. SNF  Consultants:   Palliative care  Procedures:   Antimicrobials:    flagyl -d/c  Ceftriaxone 1 dose d/c  Unasyn 12/4-to present     Subjective: Laying in bed quietly. No acute issues. Pt nonverbal  Objective: Vitals:   08/24/20 2330 08/25/20 0340 08/25/20 0823 08/25/20 1102  BP: (!) 154/87 (!) 179/101 (!) 161/86 (!) 166/93  Pulse: 82 87 76 90  Resp: _0 Temp: 98.9 F (37.2 C) 97.9 F (36.6 C) 98 F (36.7 C) 98 F (36.7 C)  TempSrc: Oral  Oral Oral  SpO2: 90% 91% 91% 94%  Weight:      Height:        Intake/Output Summary (Last 24 hours) at 08/25/2020 1410 Last data filed at 08/25/2020 1323 Gross per 24 hour  Intake 1295.29 ml  Output 300 ml  Net 995.29 ml   Filed Weights   08/23/20 0900  Weight: 74.5 kg    Examination: Calm, sleepy cta anteriorly Regular s1/s2 Soft benign, +bbs No  edema Mood and affect appropriate in current setting.     Data Reviewed: I have personally reviewed following labs and imaging studies  CBC: Recent Labs  Lab 08/22/20 1133 08/23/20 1015  WBC 10.6* 6.8  HGB 11.2* 11.0*  HCT 34.6* 34.8*  MCV 88.0 87.9  PLT 214 793   Basic Metabolic Panel: Recent Labs  Lab 08/22/20 1133  NA 139  K 3.6  CL 100  CO2 27  GLUCOSE 154*  BUN 13  CREATININE 1.17  CALCIUM 8.8*   GFR: Estimated Creatinine Clearance: 55.7 mL/min (by C-G formula based on SCr of 1.17 mg/dL). Liver Function Tests: No results for input(s): AST, ALT, ALKPHOS, BILITOT, PROT, ALBUMIN in the last 168 hours. Recent Labs  Lab 08/22/20 1133  LIPASE 23   No results for input(s): AMMONIA in the last 168 hours. Coagulation Profile: No results for input(s): INR, PROTIME in the last 168 hours. Cardiac Enzymes: No results for input(s): CKTOTAL, CKMB, CKMBINDEX, TROPONINI in the last 168 hours. BNP (last 3 results) No results for input(s): PROBNP in the last 8760 hours. HbA1C: No results for input(s): HGBA1C in the last 72 hours. CBG: Recent Labs  Lab 08/24/20 1201 08/24/20 1704 08/24/20 2106 08/25/20 0600 08/25/20 1111  GLUCAP 256* 193* 206* 174* 183*   Lipid Profile: No results for input(s): CHOL, HDL, LDLCALC, TRIG, CHOLHDL, LDLDIRECT in the last 72 hours. Thyroid Function Tests: No results for input(s): TSH, T4TOTAL, FREET4, T3FREE, THYROIDAB in the last 72 hours.  Anemia Panel: No results for input(s): VITAMINB12, FOLATE, FERRITIN, TIBC, IRON, RETICCTPCT in the last 72 hours. Sepsis Labs: Recent Labs  Lab 08/22/20 1324  LATICACIDVEN 0.7    Recent Results (from the past 240 hour(s))  Urine C&S     Status: None   Collection Time: 08/22/20 11:24 AM   Specimen: Urine, Catheterized  Result Value Ref Range Status   Specimen Description URINE, CATHETERIZED  Final   Special Requests Immunocompromised  Final   Culture   Final    NO GROWTH Performed at  [facility], [address], [place], [place] [address]    Report Status 08/23/2020 FINAL  Final  Culture, blood (routine x 2)     Status: None (Preliminary result)   Collection Time: 08/22/20 12:31 PM   Specimen: BLOOD  Result Value Ref Range Status   Specimen Description BLOOD SITE NOT SPECIFIED  Final   Special Requests   Final    BOTTLES DRAWN AEROBIC AND ANAEROBIC Blood Culture results may not be optimal due to an excessive volume of blood received in culture bottles   Culture   Final    NO GROWTH 3 DAYS Performed at [facility], [place] [address] [place], [place] [address]    Report Status PENDING  Incomplete  Culture, blood (routine x 2)     Status: None (Preliminary result)   Collection Time: 08/22/20 12:31 PM   Specimen: BLOOD  Result Value Ref Range Status   Specimen Description BLOOD SITE NOT SPECIFIED  Final   Special Requests   Final    BOTTLES DRAWN AEROBIC AND ANAEROBIC Blood Culture results may not be optimal due to an excessive volume of blood received in culture bottles   Culture   Final    NO GROWTH 3 DAYS Performed at [facility], [place] [address], [place], [place] [address]    Report Status PENDING  Incomplete         Radiology Studies: No results found.      Scheduled Meds: . amLODipine  10 mg Oral Daily  . brimonidine  1 drop Right Eye BID  . enoxaparin (LOVENOX) injection  40 mg Subcutaneous Q24H  . insulin aspart  0-9 Units Subcutaneous TID WC  . latanoprost  1 drop Right Eye QHS  . lisinopril  2.5 mg Oral Daily  . [START ON 08/26/2020] sertraline  150 mg Oral Daily  . tamsulosin  0.4 mg Oral QPC supper  . timolol  1 drop Right Eye BID   Continuous Infusions: . ampicillin-sulbactam (UNASYN) IV Stopped (08/25/20 1150)    Assessment & Plan:   Active Problems:   Encephalopathy   Altered mental status   Urinary tract infection without hematuria   Frontotemporal dementia ([place])   At risk for aspiration   Goals of care,  counseling/discussion   Palliative care by specialist   DNR (do not resuscitate)   Encephalopathy -Etiology appears to be acute on chronic, acute event likely aspiration pneumonia plus minus a recurrent UTI.  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 12/2-per wife today, overall he is deteriorating .wife would like to meet with palliative care  12/4- He does appear to be somewhat better but difficult to assess as he is nonverbal. Continue with IV antibiotics to treat the infection Blood cultures to date negative, negative urine culture so far Palliative care met with family-please see full note Family hopeful for SNF rehab disposition.  He is  enrolled in outpatient palliative through [facility] care connections and will follow. Family open to further GOC/hospice discussion pending his ability to progress with rehab PT recommends SNF Fall precautions   Acute hypoxic respite failure/possible aspiration pneumonia And aspiration event prior to admission O2 saturation above 92% on room air  Continue Unasyn  On dysphagia 1 diet  Mild risk for aspiration  Speech following   Recurrent UTI -renal US with prominent prostate  Has history of BPH but he refuses to take meds per wife  2 weeks ago with Klebsiella pneumonia pansensitive  12/4-urine culture so far negative we will follow up on final Continue IV Unasyn    Frontotemporal dementia -Continue memantine Needs to follow-up with neurology as outpatient.  Patient was lost follow-up with neurologist as early this year -Continue memantine      IIDM Blood glucose stable Continue R-ISS  HTN-currently mildly elevated Resume amlodipine home dose  Start lisinopril 2.5 mg twice daily increase as warranted      DVT prophylaxis: Enoxaparin Code Status: DNR Family Communication: Spoke to wife via phone  Status is: Inpatient  Remains inpatient appropriate  because:IV treatments appropriate due to intensity of illness or inability to take PO   Dispo: The patient is from: Home              Anticipated d/c is to: SNF              Anticipated d/c date is: 2 days              Patient currently is not medically stable to d/c.With encephlopathy needs to improve more. SNF pending .            LOS: 3 days   Time spent: 35 minutes with more than 50% on Harvey, MD [facility] Pager [phone]  If 7PM-7AM, please contact night-coverage [email or web address] Password [email or web address] 08/25/2020, 2:10 PM

## 2020-08-25 NOTE — Progress Notes (Signed)
Daily Progress Note   Patient Name: Daniel Morrow       Date: 08/25/2020 DOB: Apr 22, 1941  Age: 79 y.o. MRN#: [id number] Attending Physician: Lynn Ito, MD Primary Care Physician: Kirby Funk, MD Admit Date: 08/22/2020  Reason for Consultation/Follow-up: Establishing goals of care  Subjective: Patient awake, nonverbal with baseline dementia. Appears comfortable without pain or distress. Worked with PT earlier and tolerating almost 100% of meals with assist from wife or staff.   GOC:  Wife at bedside. Reviewed plan of care, diagnoses, interventions and our discussion yesterday. Reviewed PT recommendations and plan again to pursue SNF rehab placement. Reassured of TOC involvement. Answered questions.   Length of Stay: 3  Current Medications: Scheduled Meds:  . amLODipine  10 mg Oral Daily  . brimonidine  1 drop Right Eye BID  . enoxaparin (LOVENOX) injection  40 mg Subcutaneous Q24H  . insulin aspart  0-9 Units Subcutaneous TID WC  . latanoprost  1 drop Right Eye QHS  . lisinopril  2.5 mg Oral Daily  . [START ON 08/26/2020] sertraline  150 mg Oral Daily  . tamsulosin  0.4 mg Oral QPC supper  . timolol  1 drop Right Eye BID    Continuous Infusions: . ampicillin-sulbactam (UNASYN) IV 3 g (08/25/20 1120)    PRN Meds: acetaminophen **OR** acetaminophen, Resource ThickenUp Clear  Physical Exam Vitals and nursing note reviewed.  Constitutional:      Appearance: He is ill-appearing.  HENT:     Head: Normocephalic and atraumatic.  Pulmonary:     Effort: No tachypnea, accessory muscle usage or respiratory distress.  Skin:    General: Skin is warm and dry.  Neurological:     Mental Status: He is easily aroused.     Comments: Wakes to voice, nonverbal with baseline dementia   Psychiatric:        Attention and Perception: He is inattentive.        Speech: He is noncommunicative.        Cognition and Memory: Cognition is impaired.            Vital Signs: BP (!) 166/93 (BP Location: Right Arm)   Pulse 90   Temp 98 F (36.7 C) (Oral)   Resp 16   Ht 5\' 11"  (1.803 m)   Wt 74.5 kg  SpO2 94%   BMI 22.91 kg/m  SpO2: SpO2: 94 % O2 Device: O2 Device: Room Air O2 Flow Rate: O2 Flow Rate (L/min): 2 L/min  Intake/output summary:   Intake/Output Summary (Last 24 hours) at 08/25/2020 1317 Last data filed at 08/25/2020 1100 Gross per 24 hour  Intake 1195.29 ml  Output 300 ml  Net 895.29 ml   LBM: Last BM Date:  (PTA) Baseline Weight: Weight: 74.5 kg Most recent weight: Weight: 74.5 kg       Palliative Assessment/Data: PPS 30%    Flowsheet Rows     Most Recent Value  Intake Tab  Referral Department Hospitalist  Unit at Time of Referral Med/Surg Unit  Palliative Care Primary Diagnosis Neurology  Palliative Care Type New Palliative care  Reason for referral Clarify Goals of Care  Date first seen by Palliative Care 08/24/20  Clinical Assessment  Palliative Performance Scale Score 30%  Psychosocial & Spiritual Assessment  Palliative Care Outcomes  Patient/Family meeting held? Yes  Who was at the meeting? wife and daughter in family waiting room, daughter on speaker  Palliative Care Outcomes Clarified goals of care, Counseled regarding hospice, Provided end of life care assistance, Improved pain interventions, Improved non-pain symptom therapy, Provided psychosocial or spiritual support, Provided advance care planning, Linked to palliative care logitudinal support, ACP counseling assistance      Patient Active Problem List   Diagnosis Date Noted  . Altered mental status   . Urinary tract infection without hematuria   . Frontotemporal dementia (HCC)   . At risk for aspiration   . Goals of care, counseling/discussion   . Palliative care by  specialist   . DNR (do not resuscitate)   . Encephalopathy 08/22/2020  . Hypoxia 10/04/2019  . Glaucoma 10/04/2019  . Foreign body in esophagus   . Esophageal stenosis   . Hiatal hernia   . Chronic anticoagulation   . Esophageal dysphagia   . Esophageal stricture   . Shortness of breath at rest 09/12/2018  . Shortness of breath 09/12/2018  . Vision, loss, sudden 06/03/2016  . CRAO (central retinal artery occlusion) 06/03/2016  . Alzheimer's disease (HCC) 01/16/2016  . Epididymo-orchitis 12/02/2013  . Orchitis and epididymitis 12/02/2013  . Mild cognitive impairment, so stated 02/17/2013  . CVA (cerebral infarction) 02/17/2013  . 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 Hypertension 04/03/2012  . Type II diabetes mellitus (HCC) 04/03/2012  . Hyperlipidemia     Palliative Care Assessment & Plan   Patient Profile: 79 y.o. male  with past medical history of frontotemporal dementia, recurrent UTI's, BPH noncompliant with medications, HTN, DM type 2, depression, anxiety admitted on 08/22/2020 with altered mental status. Wife reports worsening mentation and functional status in the last 4 weeks with multiple falls. Wife reports several UTI's this year after he stopped taking BPH medication. Hospital admission for acute hypoxic respiratory failure secondary to possible aspiration pneumonia, UTI, and acute on chronic encephalopathy with baseline dementia. Palliative medicine consultation for goals of care.   Assessment: Encephalopathy Aspiration pneumonia Recurrent UTI's Frontotemporal dementia Acute hypoxic respiratory failure Hx of DM type 2 Hx of HTN  Recommendations/Plan:  Patient has a documented living will/HCPOA scanned into EMR. GOC discussion with wife and two daughters on 08/24/20.  MOST form completed. Decisions include: DNR/DNI, limited  additional interventions including rehospitalization if necessary, CPAP/BiPAP if indicated, determine use or limitation of ABX when infection occurs,  IVF for time trial, and NO feeding tube. Durable DNR completed. Electronic Vynca MOST completed.    Continue current plan of care and medical management.  PT/OT evaluations. Continue SLP follow-up.   Family hopeful for SNF rehab disposition. Currently enrolled in outpatient palliative through [facility] care connections. Updated liaison. They will continue to follow.   Family open to further GOC/hospice discussions pending his ability to progress with rehab.   Code Status: DNR/DNI   Code Status Orders  (From admission, onward)         Start     Ordered   08/22/20 1602  Do not attempt resuscitation (DNR)  Continuous       Question Answer Comment  In the event of cardiac or respiratory ARREST Do not call a "code blue"   In the event of cardiac or respiratory ARREST Do not perform Intubation, CPR, defibrillation or ACLS   In the event of cardiac or respiratory ARREST Use medication by any route, position, wound care, and other measures to relive pain and suffering. May use oxygen, suction and manual treatment of airway obstruction as needed for comfort.      08/22/20 1602        Code Status History    Date Active Date Inactive Code Status Order ID Comments User Context   10/04/2019 1134 10/05/2019 2020 Full Code [id number]  Bobette Mo, MD Inpatient   09/12/2018 1116 09/14/2018 2006 Full Code [id number]  Lanae Boast, MD Inpatient   12/02/2013 1003 12/04/2013 1546 DNR [id number]  Zannie Cove, MD Inpatient   04/03/2012 0139 04/05/2012 1711 Full Code [id number]  Ron Parker, MD ED   Advance Care Planning Activity       Prognosis:   Poor long-term with progressive frontotemporal dementia and declining functional/cognitive/nutritional status  Discharge Planning:  Skilled Nursing Facility for rehab with Palliative care  service follow-up  Care plan was discussed with wife at bedside  Thank you for allowing the Palliative Medicine Team to assist in the care of this patient.   Total Time 20 Prolonged Time Billed  no      Greater than 50%  of this time was spent counseling and coordinating care related to the above assessment and plan.  Vennie Homans, DNP, FNP-C Palliative Medicine Team  Phone: [phone] Fax: [phone]  Please contact Palliative Medicine Team phone at [phone] for questions and concerns.

## 2020-08-26 ENCOUNTER — Inpatient Hospital Stay (HOSPITAL_COMMUNITY): Payer: Medicare HMO

## 2020-08-26 LAB — GLUCOSE, CAPILLARY
Glucose-Capillary: 206 mg/dL — ABNORMAL HIGH (ref 70–99)
Glucose-Capillary: 256 mg/dL — ABNORMAL HIGH (ref 70–99)
Glucose-Capillary: 250 mg/dL — ABNORMAL HIGH (ref 70–99)
Glucose-Capillary: 221 mg/dL — ABNORMAL HIGH (ref 70–99)
Glucose-Capillary: 329 mg/dL — ABNORMAL HIGH (ref 70–99)

## 2020-08-26 MED ORDER — LIDOCAINE HCL (PF) 1 % IJ SOLN
INTRAMUSCULAR | Status: AC
  Filled 2020-08-26: qty 5

## 2020-08-26 MED ORDER — INSULIN ASPART 100 UNIT/ML ~~LOC~~ SOLN
0.0000 [IU] | Freq: Three times a day (TID) | SUBCUTANEOUS | Status: DC
  Administered 2020-08-27 (×2): 3 [IU] via SUBCUTANEOUS
  Administered 2020-08-27 – 2020-08-28 (×2): 5 [IU] via SUBCUTANEOUS
  Administered 2020-08-28: 11 [IU] via SUBCUTANEOUS
  Administered 2020-08-28: 15 [IU] via SUBCUTANEOUS
  Administered 2020-08-29: 3 [IU] via SUBCUTANEOUS
  Administered 2020-08-29: 8 [IU] via SUBCUTANEOUS
  Administered 2020-08-29: 3 [IU] via SUBCUTANEOUS
  Administered 2020-08-30 (×2): 5 [IU] via SUBCUTANEOUS
  Administered 2020-08-30: 8 [IU] via SUBCUTANEOUS
  Administered 2020-08-31: 2 [IU] via SUBCUTANEOUS
  Administered 2020-08-31: 3 [IU] via SUBCUTANEOUS
  Administered 2020-08-31 – 2020-09-01 (×2): 8 [IU] via SUBCUTANEOUS
  Administered 2020-09-01: 3 [IU] via SUBCUTANEOUS
  Administered 2020-09-01: 5 [IU] via SUBCUTANEOUS
  Administered 2020-09-02: 2 [IU] via SUBCUTANEOUS
  Administered 2020-09-02: 3 [IU] via SUBCUTANEOUS
  Administered 2020-09-02 – 2020-09-03 (×2): 2 [IU] via SUBCUTANEOUS
  Administered 2020-09-03: 3 [IU] via SUBCUTANEOUS
  Administered 2020-09-03: 2 [IU] via SUBCUTANEOUS
  Administered 2020-09-04 (×2): 5 [IU] via SUBCUTANEOUS
  Administered 2020-09-04 – 2020-09-05 (×2): 2 [IU] via SUBCUTANEOUS
  Administered 2020-09-05: 5 [IU] via SUBCUTANEOUS
  Administered 2020-09-06: 2 [IU] via SUBCUTANEOUS

## 2020-08-26 MED ORDER — INSULIN ASPART 100 UNIT/ML ~~LOC~~ SOLN
0.0000 [IU] | Freq: Every day | SUBCUTANEOUS | Status: DC
  Administered 2020-08-26 – 2020-08-27 (×2): 4 [IU] via SUBCUTANEOUS
  Administered 2020-08-29: 3 [IU] via SUBCUTANEOUS
  Administered 2020-08-31: 2 [IU] via SUBCUTANEOUS
  Administered 2020-09-03: 3 [IU] via SUBCUTANEOUS

## 2020-08-26 NOTE — Progress Notes (Addendum)
HOSPITAL MEDICINE OVERNIGHT EVENT NOTE    Notified by nursing patient has observed deformity of right shoulder and unfortunately suffered an unwitnessed fall earlier this evening.  Patient is not complaining of pain but suffers from dementia.  Will order STAT right shoulder/humerus x-ray.  Marinda Elk  MD [facility]   ADDENDUM (12/6 12:15am)  X-rays are negative for fracture or dislocation.  According to nursing, patient has become increasingly agitated throughout the evening, pulling at lines and attempting to get out of bed, potentially injuring himself and injuring plan of care.  Will provide patient with low-dose of Ativan to 0.5 mg IV x1 which the patient has received earlier in the hospitalization as well.  Patient noted to have somewhat prolonged QTc interval earlier in the hospital stay anyway so will avoid Haldol.  Deno Lunger Lyam Provencio  ADDENDUM (12/6 2:45am)  Nursing reports continued agitation.  Will give an additional 0.5mg  IV Ativan, order 1:1 sitter.   Deno Lunger Lakai Moree

## 2020-08-26 NOTE — ED Provider Notes (Signed)
Procedure note:  Patient reportedly fell out of his bed earlier today sustained a head injury with a 4 cm linear laceration on the right supraorbital region.  I was asked to assist in suture repair.  Site was anesthetized 1% lidocaine no epinephrine, total of 3 cc instilled.  Wound thoroughly irrigated with sterile saline under pressure follow 500 cc.  Patient tolerated procedure well.  No foreign body visualized.  Wound edges approximated with 4-0 Ethilon, total of 5 sutures used.  Good hemostasis achieved.  Coban dressing applied afterwards.  Advised primary team to remove sutures in 5 to 7 days.   Cheryll Cockayne, MD 08/26/20 1840

## 2020-08-26 NOTE — Progress Notes (Signed)
Pharmacy Antibiotic Note  Daniel Morrow is a 79 y.o. male admitted on 08/22/2020 with aspiration PNA and UTI.  Pharmacy has been consulted for unasyn dosing.  Plan: Continue Unasyn 3gm IV q6h Monitor clinical course, and length of therapy  Height: 5\' 11"  (180.3 cm) Weight: 74.5 kg (164 lb 3.9 oz) IBW/kg (Calculated) : 75.3  Temp (24hrs), Avg:98.2 F (36.8 C), Min:98 F (36.7 C), Max:98.6 F (37 C)  Recent Labs  Lab 08/22/20 1133 08/22/20 1324 08/23/20 1015  WBC 10.6*  --  6.8  CREATININE 1.17  --   --   LATICACIDVEN  --  0.7  --     Estimated Creatinine Clearance: 55.7 mL/min (by C-G formula based on SCr of 1.17 mg/dL).    No Known Allergies  Antimicrobials this admission: 12/1 Ceftriaxone >> x 1 12/1 Flagyl >> 12/2 12/2 Unasyn>>   Derrell Milanes A. 14/2, PharmD, BCPS, FNKF Clinical Pharmacist [place] Please utilize Amion for appropriate phone number to reach the unit pharmacist [facility])   08/26/2020 8:15 AM

## 2020-08-26 NOTE — TOC Initial Note (Addendum)
Transition of Care [facility]) - Initial/Assessment Note    Patient Details  Name: Daniel Morrow MRN: [id number] Date of Birth: [birth year]  [facility] CM/SW Contact:    [name], LCSWA Phone Number: 08/26/2020, 10:13 AM  Clinical Narrative:                 CSW spoke with pt's wife Daniel Morrow who confirmed that she is interested in SNF placement. She noted no preference, but had heard of [facility], and would like to consider them. She noted that pt has had both Pfizer vaccines. Medicare list will be given at bedside this afternoon. Faxout and workup completed, Unable to complete PASSR as SS# is wrong in the [employer] system. The one in Epic is correct, confirmed by wife. Could not update as PASSR is closed. Expected Discharge Plan: Skilled Nursing Facility Barriers to Discharge: Continued Medical Work up, English as a second language teacher, SNF Pending bed offer   Patient Goals and CMS Choice Patient states their goals for this hospitalization and ongoing recovery are:: Pt's wife interested in SNF placement to see how pt progresses in rehab. CMS Medicare.gov Compare Post Acute Care list provided to:: Patient Represenative (must comment) (Spouse)    Expected Discharge Plan and Services Expected Discharge Plan: Skilled Nursing Facility     Post Acute Care Choice: Skilled Nursing Facility Living arrangements for the past 2 months: Single Family Home                                      Prior Living Arrangements/Services Living arrangements for the past 2 months: Single Family Home Lives with:: Spouse Patient language and need for interpreter reviewed:: Yes Do you feel safe going back to the place where you live?: Yes      Need for Family Participation in Patient Care: Yes (Comment) Care giver support system in place?: Yes (comment)   Criminal Activity/Legal Involvement Pertinent to Current Situation/Hospitalization: No - Comment as needed  Activities of Daily Living       Permission Sought/Granted Permission sought to share information with : Family Supports    Share Information with NAME: Daniel Morrow     Permission granted to share info w Relationship: Spouse  Permission granted to share info w Contact Information: [phone]  Emotional Assessment Appearance:: Other (Comment Required (Unable to Assess) Attitude/Demeanor/Rapport: Unable to Assess Affect (typically observed): Unable to Assess Orientation: :  (Unable to Assess) Alcohol / Substance Use: Not Applicable Psych Involvement: No (comment)  Admission diagnosis:  UTI (urinary tract infection) [N39.0] Encephalopathy [G93.40] Acute cystitis without hematuria [N30.00] Altered mental status, unspecified altered mental status type [R41.82] Patient Active Problem List   Diagnosis Date Noted  . Altered mental status   . Urinary tract infection without hematuria   . Frontotemporal dementia (HCC)   . At risk for aspiration   . Goals of care, counseling/discussion   . Palliative care by specialist   . DNR (do not resuscitate)   . Encephalopathy 08/22/2020  . Hypoxia 10/04/2019  . Glaucoma 10/04/2019  . Foreign body in esophagus   . Esophageal stenosis   . Hiatal hernia   . Chronic anticoagulation   . Esophageal dysphagia   . Esophageal stricture   . Shortness of breath at rest 09/12/2018  . Shortness of breath 09/12/2018  . Vision, loss, sudden 06/03/2016  . CRAO (central retinal artery occlusion) 06/03/2016  . Alzheimer's disease (HCC)  01/16/2016  . Epididymo-orchitis 12/02/2013  . Orchitis and epididymitis 12/02/2013  . Mild cognitive impairment, so stated 02/17/2013  . CVA (cerebral infarction) 02/17/2013  . 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 Hypertension 04/03/2012  . Type II diabetes mellitus (HCC) 04/03/2012  . Hyperlipidemia    PCP:   Kirby Funk, MD Pharmacy:   [facility] [address], [place] - [address] [place] [address] Phone: [phone] Fax: [phone]     Social Determinants of Health (SDOH) Interventions    Readmission Risk Interventions No flowsheet data found.

## 2020-08-26 NOTE — Progress Notes (Signed)
Pt found on ground upon entering room by RN, unwitnessed fall. Pt was in low bed, bed alarm was on. Floor mats in use. Pt noted to have a laceration over right eye brow. MD notified. CT scan completed, laceration was sutured. Family notified. VS are stable. Pt is back in room in bed closer to nurses station. Will continue to monitor. Mental status is at baseline.

## 2020-08-26 NOTE — Progress Notes (Addendum)
PROGRESS NOTE    Daniel Morrow  ERX:[id number] DOB: 1940-12-08 DOA: 08/22/2020 PCP: Kirby Funk, MD    Brief Narrative:  Daniel Morrow is a 79 y.o. male with medical history significant of frontotemporal dementia, recurrent UTIs, BPH noncompliant with medications, HTN, IIDM, chronic ambulatory dysfunction, depression anxiety, presented with fever, hypoxia. Patient nonverbal at baseline minimally responsive, most history provided by patient and wife at bedside. Wife reported patient has had several episodes of UTI this year after stopped taking his BPH medications. Each time when UTI happens, patient usually became less responsive. Baseline mental status wise, patient has had worsening of being responsive to outside stimulus, and recent month his mentation has significantly deteriorated. For last 4 weeks, patient has been bed bound and wheelchair bound, unable to transfer himself.  Compared to before he was able to use a walker despite being very wobbly and has sustained multiple falls.  12/3 - Palliative meeting with family -see note 12/4 - PT rec SNF - discussion with family about possible need for placement given ongoing improvement - essentially back to baseline 12/5 - No changes clinically -awaiting discharge to SNF. **EVENING UPDATE - patient had witnessed fall from bed with notable laceration to R supraorbital area - CT head personally reviewed with no large bleed/fracture - awaiting formal read from radiology. ED consulted to help with laceration repair.   Assessment & Plan:  Acute on chronic metabolic encephalopathy POA - Acute event likely aspiration pneumonia plus minus a recurrent UTI.   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 - Continue with IV antibiotics as below through 08/28/2020 - Blood cultures to date negative, negative urine culture so far - Palliative care following - appreciate their assistance -  Family hopeful for SNF rehab disposition. He is enrolled in outpatient palliative through [facility] care connections and will follow. - Family open to further GOC/hospice discussion pending his ability to progress with rehab - PT recommends SNF - Fall precautions  Acute hypoxic respite failure/possible aspiration pneumonia, resolving -Likely recurrent aspirations prior to admission - O2 saturation above 92% on room air  - Continue Unasyn  - On dysphagia 1 diet  - Advanced risk for aspiration given his baseline mental status with acute events as outlined above - Speech following  Recurrent UTI - Renal US with prominent prostate  - Has history of BPH but he refuses to take meds per wife  - 2 weeks ago with Klebsiella pneumonia pansensitive  - 12/4-urine culture remains negative, blood cultures continue to be negative as well -Unasyn ongoing to treat UTI and aspiration pneumonia as above through 08/28/2020  Frontotemporal dementia -Continue memantine Needs to follow-up with neurology as outpatient.  Patient was lost follow-up with neurologist as early this year -Continue memantine   IIDM type II, controlled Blood glucose stable Continue R-ISS A1c 7.0  HTN-currently mildly elevated Resume amlodipine home dose  Start lisinopril 2.5 mg twice daily increase as warranted    DVT prophylaxis: Enoxaparin Code Status: DNR Family Communication: Spoke to wife via phone  Status is: Inpatient  Remains inpatient appropriate because:IV treatments appropriate due to intensity of illness or inability to take PO   Dispo: The patient is from: Home              Anticipated d/c is to: SNF              Anticipated d/c date is: 64-48h  Patient currently is medically stable for discharge, improving mental status, can transition to p.o. antibiotics at discharge if necessary  Consultants:   Palliative care  Procedures:   Antimicrobials:    flagyl -d/c  Ceftriaxone 1 dose  d/c  Unasyn 12/2-12/7     Subjective: No acute issues or events overnight, resting comfortably in bed, nonverbal and poorly interactive at his baseline.  Objective: Vitals:   08/25/20 1624 08/25/20 1922 08/25/20 2320 08/26/20 0339  BP: (!) 163/86 129/81 131/81 (!) 167/87  Pulse: 97 88 92 79  Resp: 17 17 17 17   Temp: 98.6 F (37 C) 98 F (36.7 C) 98 F (36.7 C) 98.5 F (36.9 C)  TempSrc: Oral   Oral  SpO2: 97%   98%  Weight:      Height:        Intake/Output Summary (Last 24 hours) at 08/26/2020 0710 Last data filed at 08/26/2020 0100 Gross per 24 hour  Intake 1361.12 ml  Output 950 ml  Net 411.12 ml   Filed Weights   08/23/20 0900  Weight: 74.5 kg    Examination: General:  Pleasantly resting in bed, No acute distress. HEENT:  Normocephalic atraumatic.  Sclerae nonicteric, noninjected.  Extraocular movements intact bilaterally. Neck:  Without mass or deformity.  Trachea is midline. Lungs:  Clear to auscultate bilaterally without rhonchi, wheeze, or rales. Heart:  Regular rate and rhythm.  Without murmurs, rubs, or gallops. Abdomen:  Soft, nontender, nondistended.  Without guarding or rebound. Extremities: Without cyanosis, clubbing, edema, or obvious deformity. Vascular:  Dorsalis pedis and posterior tibial pulses palpable bilaterally. Skin:  Warm and dry, no erythema, no ulcerations.   Data Reviewed: I have personally reviewed following labs and imaging studies  CBC: Recent Labs  Lab 08/22/20 1133 08/23/20 1015  WBC 10.6* 6.8  HGB 11.2* 11.0*  HCT 34.6* 34.8*  MCV 88.0 87.9  PLT 214 199   Basic Metabolic Panel: Recent Labs  Lab 08/22/20 1133  NA 139  K 3.6  CL 100  CO2 27  GLUCOSE 154*  BUN 13  CREATININE 1.17  CALCIUM 8.8*   GFR: Estimated Creatinine Clearance: 55.7 mL/min (by C-G formula based on SCr of 1.17 mg/dL). Liver Function Tests: No results for input(s): AST, ALT, ALKPHOS, BILITOT, PROT, ALBUMIN in the last 168 hours. Recent  Labs  Lab 08/22/20 1133  LIPASE 23   No results for input(s): AMMONIA in the last 168 hours. Coagulation Profile: No results for input(s): INR, PROTIME in the last 168 hours. Cardiac Enzymes: No results for input(s): CKTOTAL, CKMB, CKMBINDEX, TROPONINI in the last 168 hours. BNP (last 3 results) No results for input(s): PROBNP in the last 8760 hours. HbA1C: No results for input(s): HGBA1C in the last 72 hours. CBG: Recent Labs  Lab 08/25/20 0600 08/25/20 1111 08/25/20 1629 08/25/20 2321 08/26/20 0640  GLUCAP 174* 183* 310* 355* 206*   Lipid Profile: No results for input(s): CHOL, HDL, LDLCALC, TRIG, CHOLHDL, LDLDIRECT in the last 72 hours. Thyroid Function Tests: No results for input(s): TSH, T4TOTAL, FREET4, T3FREE, THYROIDAB in the last 72 hours. Anemia Panel: No results for input(s): VITAMINB12, FOLATE, FERRITIN, TIBC, IRON, RETICCTPCT in the last 72 hours. Sepsis Labs: Recent Labs  Lab 08/22/20 1324  LATICACIDVEN 0.7    Recent Results (from the past 240 hour(s))  Urine C&S     Status: None   Collection Time: 08/22/20 11:24 AM   Specimen: Urine, Catheterized  Result Value Ref Range Status   Specimen Description URINE, CATHETERIZED  Final   Special Requests Immunocompromised  Final   Culture   Final    NO GROWTH Performed at [facility], [address] [place], [place] [address]    Report Status 08/23/2020 FINAL  Final  Culture, blood (routine x 2)     Status: None (Preliminary result)   Collection Time: 08/22/20 12:31 PM   Specimen: BLOOD  Result Value Ref Range Status   Specimen Description BLOOD SITE NOT SPECIFIED  Final   Special Requests   Final    BOTTLES DRAWN AEROBIC AND ANAEROBIC Blood Culture results may not be optimal due to an excessive volume of blood received in culture bottles   Culture   Final    NO GROWTH 3 DAYS Performed at [facility], [address], [place], [place] [address]    Report Status PENDING  Incomplete   Culture, blood (routine x 2)     Status: None (Preliminary result)   Collection Time: 08/22/20 12:31 PM   Specimen: BLOOD  Result Value Ref Range Status   Specimen Description BLOOD SITE NOT SPECIFIED  Final   Special Requests   Final    BOTTLES DRAWN AEROBIC AND ANAEROBIC Blood Culture results may not be optimal due to an excessive volume of blood received in culture bottles   Culture   Final    NO GROWTH 3 DAYS Performed at [facility], [address] [place], [place] [address]    Report Status PENDING  Incomplete         Radiology Studies: No results found.      Scheduled Meds: . amLODipine  10 mg Oral Daily  . brimonidine  1 drop Right Eye BID  . enoxaparin (LOVENOX) injection  40 mg Subcutaneous Q24H  . insulin aspart  0-9 Units Subcutaneous TID WC  . latanoprost  1 drop Right Eye QHS  . lisinopril  2.5 mg Oral BID  . sertraline  150 mg Oral Daily  . tamsulosin  0.4 mg Oral QPC supper  . timolol  1 drop Right Eye BID   Continuous Infusions: . ampicillin-sulbactam (UNASYN) IV 3 g (08/26/20 9937)     LOS: 4 days   Time spent: 35 minutes   Azucena Fallen, DO [facility]  Pager: Secure chat  08/26/2020, 7:10 AM

## 2020-08-26 NOTE — Evaluation (Signed)
Occupational Therapy Evaluation Patient Details Name: Daniel Morrow MRN: [id number] DOB: 02-Nov-1940 Today's Date: 08/26/2020    History of Present Illness 79 y.o. male  with past medical history of frontotemporal dementia, recurrent UTI's, BPH noncompliant with medications, HTN, DM type 2, depression, anxiety admitted on 08/22/2020 with altered mental status. Pt experiencing multiple falls over the last few weeks. Pt found to have aspiration PNA, UTI, and acute on chronic encephalopathy.   Clinical Impression   This 79 y/o male presents with the above. PLOF obtained via chart review; PTA pt living with spouse, using RW for mobility and receiving assist from spouse for ADL (was able to self-feed), pt with hx of falls. Pt seated EOB upon arrival to room. He requires up to maxA for sit<>stand and modA for static balance given R lateral lean. Pt requiring up to totalA for pericare (pt had had BM and leaking condom cath upon arrival) and modA for seated UB ADL. Pt nonverbal during session but following majority of simple commands given multimodal cues. Pt to benefit from continued acute OT services and currently recommend follow up therapy services in SNF setting to maximize his overall safety and independence with ADL and mobility.     Follow Up Recommendations  SNF    Equipment Recommendations  Other (comment) (defer to next venue)           Precautions / Restrictions Precautions Precautions: Fall Precaution Comments: mostly nonverbal at baseline Restrictions Weight Bearing Restrictions: No      Mobility Bed Mobility Overal bed mobility: Needs Assistance Bed Mobility: Sit to Supine       Sit to supine: Max assist   General bed mobility comments: pt seated EOB upon arrival; tactile/verbal cues to initiate return to supine end of session with assist to support trunk and LEs, pt does initiate/assist at least 25%    Transfers Overall transfer level: Needs assistance Equipment  used: 1 person hand held assist Transfers: Sit to/from Stand Sit to Stand: Max assist;Mod assist         General transfer comment: maxA initially progresesd to [facility] for second stand (improved ability to boost to stand). pt with R lateral lean requiring physical assist to correct     Balance Overall balance assessment: Needs assistance Sitting-balance support: No upper extremity supported;Feet supported Sitting balance-Leahy Scale: Fair Sitting balance - Comments: supervision for safety   Standing balance support: Single extremity supported;Bilateral upper extremity supported Standing balance-Leahy Scale: Poor Standing balance comment: reliant on external assist                            ADL either performed or assessed with clinical judgement   ADL Overall ADL's : Needs assistance/impaired Eating/Feeding: Moderate assistance;Sitting   Grooming: Sitting;Moderate assistance   Upper Body Bathing: Moderate assistance   Lower Body Bathing: Maximal assistance;Sit to/from stand   Upper Body Dressing : Moderate assistance Upper Body Dressing Details (indicate cue type and reason): donning new gown Lower Body Dressing: Maximal assistance;Sit to/from stand Lower Body Dressing Details (indicate cue type and reason): pt lifting his legs for OT to adjust his socks      Toileting- Clothing Manipulation and Hygiene: Total assistance;Sit to/from stand;+2 for physical assistance Toileting - Clothing Manipulation Details (indicate cue type and reason): OT assisting for static balance in standing (modA) while RN providing pericare, pt had had BM and condom cath leaking      Functional mobility during ADLs: Maximal assistance;Moderate  assistance (maxA progressed to modA, sit<>stand only)                           Pertinent Vitals/Pain Pain Assessment: Faces Faces Pain Scale: No hurt Pain Intervention(s): Monitored during session     Hand Dominance      Extremity/Trunk Assessment Upper Extremity Assessment Upper Extremity Assessment: Generalized weakness;Difficult to assess due to impaired cognition   Lower Extremity Assessment Lower Extremity Assessment: Defer to PT evaluation   Cervical / Trunk Assessment Cervical / Trunk Assessment: Kyphotic   Communication Communication Communication: Expressive difficulties;HOH (pert chart, mostly nonverbal)   Cognition Arousal/Alertness: Awake/alert Behavior During Therapy: Flat affect Overall Cognitive Status: History of cognitive impairments - at baseline                                 General Comments: pt with hx of dementia, nonverbal this session but overall following basic commands given intermittent visual/tactile cues    General Comments       Exercises     Shoulder Instructions      Home Living Family/patient expects to be discharged to:: Skilled nursing facility                                 Additional Comments: pt lives at home with spouse, one level home      Prior Functioning/Environment Level of Independence: Needs assistance  Gait / Transfers Assistance Needed: pt ambulates limited household distances with minG and use of RW, has recent history of falls ADL's / Homemaking Assistance Needed: requires assistance with ADLs, was able to feed himself until recently            OT Problem List: Decreased strength;Decreased range of motion;Decreased activity tolerance;Impaired balance (sitting and/or standing);Decreased cognition;Decreased safety awareness;Decreased knowledge of use of DME or AE;Decreased knowledge of precautions      OT Treatment/Interventions: Self-care/ADL training;Therapeutic exercise;Energy conservation;DME and/or AE instruction;Therapeutic activities;Cognitive remediation/compensation;Patient/family education;Balance training    OT Goals(Current goals can be found in the care plan section) Acute Rehab OT  Goals Patient Stated Goal: To go to rehab and improve mobility  OT Goal Formulation: With patient Time For Goal Achievement: 09/09/20 Potential to Achieve Goals: Fair  OT Frequency: Min 2X/week   Barriers to D/C:            Co-evaluation              AM-PAC OT "6 Clicks" Daily Activity     Outcome Measure Help from another person eating meals?: A Lot Help from another person taking care of personal grooming?: A Lot Help from another person toileting, which includes using toliet, bedpan, or urinal?: Total Help from another person bathing (including washing, rinsing, drying)?: A Lot Help from another person to put on and taking off regular upper body clothing?: A Lot Help from another person to put on and taking off regular lower body clothing?: A Lot 6 Click Score: 11   End of Session Equipment Utilized During Treatment: Gait belt Nurse Communication: Mobility status  Activity Tolerance: Patient tolerated treatment well Patient left: in bed;with call bell/phone within reach;with bed alarm set  OT Visit Diagnosis: Other symptoms and signs involving cognitive function;Other abnormalities of gait and mobility (R26.89);Muscle weakness (generalized) (M62.81)  Time: 9574-7340 OT Time Calculation (min): 23 min Charges:  OT General Charges $OT Visit: 1 Visit OT Evaluation $OT Eval Moderate Complexity: 1 Mod OT Treatments $Self Care/Home Management : 8-22 mins  Marcy Siren, OT Acute Rehabilitation Services Pager [phone] Office [phone]   Orlando Penner 08/26/2020, 8:55 AM

## 2020-08-27 LAB — CULTURE, BLOOD (ROUTINE X 2)
Culture: NO GROWTH
Culture: NO GROWTH

## 2020-08-27 LAB — GLUCOSE, CAPILLARY
Glucose-Capillary: 193 mg/dL — ABNORMAL HIGH (ref 70–99)
Glucose-Capillary: 230 mg/dL — ABNORMAL HIGH (ref 70–99)
Glucose-Capillary: 158 mg/dL — ABNORMAL HIGH (ref 70–99)
Glucose-Capillary: 320 mg/dL — ABNORMAL HIGH (ref 70–99)

## 2020-08-27 MED ORDER — LORAZEPAM 2 MG/ML IJ SOLN
0.5000 mg | Freq: Once | INTRAMUSCULAR | Status: AC
  Administered 2020-08-27: 0.5 mg via INTRAVENOUS
  Filled 2020-08-27: qty 1

## 2020-08-27 MED ORDER — QUETIAPINE FUMARATE 25 MG PO TABS
25.0000 mg | ORAL_TABLET | Freq: Two times a day (BID) | ORAL | Status: DC
  Administered 2020-08-27 – 2020-09-03 (×16): 25 mg via ORAL
  Filled 2020-08-27 (×17): qty 1

## 2020-08-27 NOTE — Social Work (Signed)
RE: Daniel Morrow DOB: [birth year] Date: 08/27/2020  To Whom It May Conern:   Please be advised that the above-named patient has a primary diagnosis of dementia which supersedes any psychiatric diagnosis.

## 2020-08-27 NOTE — Progress Notes (Signed)
  Speech Language Pathology Treatment: Dysphagia  Patient Details Name: Daniel Morrow MRN: [id number] DOB: Oct 10, 1940 Today's Date: 08/27/2020 Time: 7062-3762 SLP Time Calculation (min) (ACUTE ONLY): 17 min  Assessment / Plan / Recommendation Clinical Impression  Pt was seen for dysphagia treatment. His swallow function was notably worse than when he was last seen by this SLP on 12/2 but similar to that which was noted by SLP on 12/3. Pt was lethargic throughout the session and intermittently opened his eyes for brief periods. He tolerated puree solids without overt s/sx of aspiration, but exhibited coughing with thin liquids and intermittent throat clearing with nectar thick liquids via straw when consecutive swallows were used. Throat clearing was demonstrated twice with nectar thick liquids via cup and appeared related to bolus size. No significant oral residue was noted and pt did not demonstrate symptoms of pharyngeal residue. Pt did receive Ativan today and the impact of this on his performance is considered. It is recommended that his current diet be continued with strict observance of swallowing precautions. SLP will continue to follow pt.    HPI HPI: Pt is a 79 y.o. male with medical history significant of frontotemporal dementia, recurrent UTIs, BPH noncompliant with medications, HTN, IIDM, chronic ambulatory dysfunction, depression anxiety, who presented with fever, hypoxia, and AMS. Per H&P, pt nonverbal at baseline and minimally responsive. Note from ED MD stated "He has history of prior stroke and is minimally verbal, however has been less vocal over the past 2 days." CT head: No acute intracranial abnormality. CXR was negative for acute changes.       SLP Plan  Continue with current plan of care       Recommendations  Diet recommendations: Dysphagia 1 (puree);Nectar-thick liquid Liquids provided via: Teaspoon;Cup Medication Administration: Crushed with puree Supervision: Full  supervision/cueing for compensatory strategies Compensations: Slow rate;Small sips/bites;Minimize environmental distractions Postural Changes and/or Swallow Maneuvers: Seated upright 90 degrees                Oral Care Recommendations: Oral care BID Follow up Recommendations: None SLP Visit Diagnosis: Dysphagia, unspecified (R13.10) Plan: Continue with current plan of care       Shron Ozer I. Vear Clock, MS, CCC-SLP Acute Rehabilitation Services Office number [phone] Pager [phone]                Scheryl Marten 08/27/2020, 2:21 PM

## 2020-08-27 NOTE — TOC Progression Note (Deleted)
Transition of Care [facility]) - Progression Note    Patient Details  Name: Daniel Morrow MRN: [id number] Date of Birth: 03/29/1941  [facility]) CM/SW Contact  [name], [place] Phone Number: 08/27/2020, 8:36 AM  Clinical Narrative:     RE: Taevin L. Hellard DOB: [birth year] Date: 08/27/2020  To Whom It May Conern:   Please be advised that the above-named patient has a primary diagnosis of dementia which supersedes any psychiatric diagnosis.   Expected Discharge Plan: Skilled Nursing Facility Barriers to Discharge: Continued Medical Work up, English as a second language teacher, SNF Pending bed offer  Expected Discharge Plan and Services Expected Discharge Plan: Skilled Nursing Facility     Post Acute Care Choice: Skilled Nursing Facility Living arrangements for the past 2 months: Single Family Home                                       Social Determinants of Health (SDOH) Interventions    Readmission Risk Interventions No flowsheet data found.

## 2020-08-27 NOTE — Progress Notes (Addendum)
PROGRESS NOTE    Daniel Morrow  MWN:[id number] DOB: 03-Jul-1941 DOA: 08/22/2020 PCP: Kirby Funk, MD    Brief Narrative:  Daniel Morrow is a 79 y.o. male with medical history significant of frontotemporal dementia, recurrent UTIs, BPH noncompliant with medications, HTN, IIDM, chronic ambulatory dysfunction, depression anxiety, presented with fever, hypoxia. Patient nonverbal at baseline minimally responsive, most history provided by patient and wife at bedside. Wife reported patient has had several episodes of UTI this year after stopped taking his BPH medications. Each time when UTI happens, patient usually became less responsive. Baseline mental status wise, patient has had worsening of being responsive to outside stimulus, and recent month his mentation has significantly deteriorated. For last 4 weeks, patient has been bed bound and wheelchair bound, unable to transfer himself.  Compared to before he was able to use a walker despite being very wobbly and has sustained multiple falls.  12/3 - Palliative meeting with family -see note 12/4 - PT rec SNF - discussion with family about possible need for placement given ongoing improvement - essentially back to baseline 12/5 - No changes clinically -awaiting discharge to SNF. **EVENING UPDATE - patient had witnessed fall from bed with notable laceration to R supraorbital area - CT head personally reviewed with no large bleed/fracture - awaiting formal read from radiology. ED consulted to help with laceration repair.  12/6 - Shoulder imaging overnight negative - continues to be agitated but improved with ativan 1mg  - initiate BID low dose seroquel and attempt to limit benzos given patient's increased somnolence today  Assessment & Plan:  Acute on chronic metabolic encephalopathy POA - Acute event likely aspiration pneumonia plus minus a recurrent UTI.   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 - Continue with IV antibiotics as below through 08/28/2020 - Blood cultures to date negative, negative urine culture so far - Palliative care following - appreciate their assistance - Family hopeful for SNF rehab disposition. He is enrolled in outpatient palliative through [facility] care connections and will follow. - Family open to further GOC/hospice discussion pending his ability to progress with rehab - PT recommends SNF -wife agrees, having difficulty managing patient at home by herself especially with new onset weakness - Fall precautions  Acute hypoxic respite failure/possible aspiration pneumonia, resolving - Likely recurrent aspirations prior to admission - O2 saturation above 92% on room air  - Continue Unasyn  - On dysphagia 1 diet  - Advanced risk for aspiration given his baseline mental status with acute events as outlined above - Speech following  Recurrent UTI - [facility] with prominent prostate  - Has history of BPH but he refuses to take meds per wife  - 2 weeks ago with Klebsiella pneumonia pansensitive  - 12/4-urine culture remains negative, blood cultures continue to be negative as well -Unasyn ongoing to treat UTI and aspiration pneumonia as above through 08/28/2020  Profound ambulatory dysfunction - Patient had fall night of 12/5 with supraorbital R eye lac requiring 5 stitches - CT head negative - Continue PT/OT - awaiting SNF placement  Frontotemporal dementia -Continue memantine Needs to follow-up with neurology as outpatient.  Patient was lost follow-up with neurologist as early this year -Continue memantine   IIDM type II, controlled Blood glucose stable Continue sliding scale insulin A1c 7.0  HTN-currently mildly elevated Continue amlodipine Continue lisinopril 2.5 mg twice daily   DVT  prophylaxis: Enoxaparin Code Status: DNR Family  Communication: Spoke to wife via phone  Status is: Inpatient   Dispo: The patient is from: Home              Anticipated d/c is to: SNF              Anticipated d/c date is: 24-48h              Patient currently is medically stable for discharge, improving mental status, can transition to p.o. antibiotics at discharge if necessary  Consultants:   Palliative care  Procedures:   Antimicrobials:    flagyl -d/c  Ceftriaxone 1 dose d/c  Unasyn 12/2-12/7  Subjective: Patient had notable mechanical fall yesterday evening, now with 5 sutures over his right eye, this morning patient has notable ecchymoses slightly more somnolent this morning due to Ativan overnight but otherwise review of systems is markedly limited..  Objective: Vitals:   08/26/20 1750 08/26/20 2012 08/26/20 2351 08/27/20 0324  BP: (!) 163/98 (!) 171/91 (!) 152/94 (!) 141/79  Pulse: 88 81 80 74  Resp: 20 19 (!) 22 18  Temp: 98.1 F (36.7 C) 98.8 F (37.1 C) 98.5 F (36.9 C) 98.7 F (37.1 C)  TempSrc: Oral Oral Oral Oral  SpO2: 93% 94% 93% 93%  Weight:      Height:        Intake/Output Summary (Last 24 hours) at 08/27/2020 0719 Last data filed at 08/27/2020 0200 Gross per 24 hour  Intake 597 ml  Output 400 ml  Net 197 ml   Filed Weights   08/23/20 0900  Weight: 74.5 kg    Examination:  General: Somnolent, minimally arousable today HEENT: Right supraorbital laceration with 5 sutures in place, bandage clean dry intact.  Notable ecchymosis of the upper eyelid encompassing large majority of the right forehead. Neck:  Without mass or deformity.  Trachea is midline. Lungs:  Clear to auscultate bilaterally without rhonchi, wheeze, or rales. Heart:  Regular rate and rhythm.  Without murmurs, rubs, or gallops. Abdomen:  Soft, nontender, nondistended.  Without guarding or rebound. Extremities: Without cyanosis, clubbing, edema, or obvious deformity. Vascular:  Dorsalis pedis and posterior tibial pulses  palpable bilaterally.  Data Reviewed: I have personally reviewed following labs and imaging studies  CBC: Recent Labs  Lab 08/22/20 1133 08/23/20 1015  WBC 10.6* 6.8  HGB 11.2* 11.0*  HCT 34.6* 34.8*  MCV 88.0 87.9  PLT 214 199   Basic Metabolic Panel: Recent Labs  Lab 08/22/20 1133  NA 139  K 3.6  CL 100  CO2 27  GLUCOSE 154*  BUN 13  CREATININE 1.17  CALCIUM 8.8*   GFR: Estimated Creatinine Clearance: 55.7 mL/min (by C-G formula based on SCr of 1.17 mg/dL). Liver Function Tests: No results for input(s): AST, ALT, ALKPHOS, BILITOT, PROT, ALBUMIN in the last 168 hours. Recent Labs  Lab 08/22/20 1133  LIPASE 23   No results for input(s): AMMONIA in the last 168 hours. Coagulation Profile: No results for input(s): INR, PROTIME in the last 168 hours. Cardiac Enzymes: No results for input(s): CKTOTAL, CKMB, CKMBINDEX, TROPONINI in the last 168 hours. BNP (last 3 results) No results for input(s): PROBNP in the last 8760 hours. HbA1C: No results for input(s): HGBA1C in the last 72 hours. CBG: Recent Labs  Lab 08/26/20 1226 08/26/20 1557 08/26/20 1744 08/26/20 2316 08/27/20 0619  GLUCAP 256* 250* 221* 329* 193*   Lipid Profile: No results for input(s): CHOL, HDL, LDLCALC, TRIG, CHOLHDL,  LDLDIRECT in the last 72 hours. Thyroid Function Tests: No results for input(s): TSH, T4TOTAL, FREET4, T3FREE, THYROIDAB in the last 72 hours. Anemia Panel: No results for input(s): VITAMINB12, FOLATE, FERRITIN, TIBC, IRON, RETICCTPCT in the last 72 hours. Sepsis Labs: Recent Labs  Lab 08/22/20 1324  LATICACIDVEN 0.7    Recent Results (from the past 240 hour(s))  Urine C&S     Status: None   Collection Time: 08/22/20 11:24 AM   Specimen: Urine, Catheterized  Result Value Ref Range Status   Specimen Description URINE, CATHETERIZED  Final   Special Requests Immunocompromised  Final   Culture   Final    NO GROWTH Performed at [facility], [address]  [place], [place] [address]    Report Status 08/23/2020 FINAL  Final  Culture, blood (routine x 2)     Status: None (Preliminary result)   Collection Time: 08/22/20 12:31 PM   Specimen: BLOOD  Result Value Ref Range Status   Specimen Description BLOOD SITE NOT SPECIFIED  Final   Special Requests   Final    BOTTLES DRAWN AEROBIC AND ANAEROBIC Blood Culture results may not be optimal due to an excessive volume of blood received in culture bottles   Culture   Final    NO GROWTH 4 DAYS Performed at [facility], [address] [place], [place] [address]    Report Status PENDING  Incomplete  Culture, blood (routine x 2)     Status: None (Preliminary result)   Collection Time: 08/22/20 12:31 PM   Specimen: BLOOD  Result Value Ref Range Status   Specimen Description BLOOD SITE NOT SPECIFIED  Final   Special Requests   Final    BOTTLES DRAWN AEROBIC AND ANAEROBIC Blood Culture results may not be optimal due to an excessive volume of blood received in culture bottles   Culture   Final    NO GROWTH 4 DAYS Performed at [facility], [address] [place], [place] [address]    Report Status PENDING  Incomplete         Radiology Studies: DG Shoulder 1V Right  Result Date: 08/26/2020 CLINICAL DATA:  Pain EXAM: RIGHT SHOULDER - 1 VIEW; RIGHT HUMERUS - 2+ VIEW COMPARISON:  None. FINDINGS: There is no evidence of fracture or dislocation. There is no evidence of arthropathy or other focal bone abnormality. Soft tissues are unremarkable. IMPRESSION: No acute displaced fracture or dislocation. Evaluation is limited by single view technique of the right shoulder. Electronically Signed   By: Katherine Mantle M.D.   On: 08/26/2020 21:52   CT HEAD WO CONTRAST  Result Date: 08/26/2020 CLINICAL DATA:  Facial trauma. Additional history provided: Fall on unit, laceration to forehead. EXAM: CT HEAD WITHOUT CONTRAST TECHNIQUE: Contiguous axial images were obtained from the base of the skull  through the vertex without intravenous contrast. COMPARISON:  Prior head CT examinations 08/22/2020 and earlier. FINDINGS: Brain: Mild cerebral and cerebellar atrophy. Ill-defined hypoattenuation within the cerebral white matter is nonspecific, but compatible chronic small vessel ischemic disease. Redemonstrated small chronic cerebellar infarcts. There is no acute intracranial hemorrhage. No demarcated cortical infarct. No extra-axial fluid collection. No midline shift. Vascular: No hyperdense vessel.  Atherosclerotic calcifications. Skull: No calvarial fracture. Redemonstrated chronic fractures of the left zygomatic arch. Sinuses/Orbits: Visualized orbits show no acute finding. Chronic left globe findings suggestive of vitreous detachment (series 3, image 10). Mild scattered paranasal sinus mucosal thickening. Small bilateral maxillary sinus mucous retention cysts. Other: Right  forehead soft tissue swelling. IMPRESSION: No evidence of acute intracranial abnormality. Right forehead soft tissue swelling. Stable parenchymal atrophy and chronic small vessel ischemic disease. Redemonstrated small chronic cerebellar infarcts. Electronically Signed   By: Jackey LogeKyle  Golden DO   On: 08/26/2020 18:21   DG Humerus Right  Result Date: 08/26/2020 CLINICAL DATA:  Pain EXAM: RIGHT SHOULDER - 1 VIEW; RIGHT HUMERUS - 2+ VIEW COMPARISON:  None. FINDINGS: There is no evidence of fracture or dislocation. There is no evidence of arthropathy or other focal bone abnormality. Soft tissues are unremarkable. IMPRESSION: No acute displaced fracture or dislocation. Evaluation is limited by single view technique of the right shoulder. Electronically Signed   By: Katherine Mantlehristopher  Green M.D.   On: 08/26/2020 21:52        Scheduled Meds: . amLODipine  10 mg Oral Daily  . brimonidine  1 drop Right Eye BID  . enoxaparin (LOVENOX) injection  40 mg Subcutaneous Q24H  . insulin aspart  0-15 Units Subcutaneous TID WC  . insulin aspart  0-5 Units  Subcutaneous QHS  . latanoprost  1 drop Right Eye QHS  . lisinopril  2.5 mg Oral BID  . sertraline  150 mg Oral Daily  . tamsulosin  0.4 mg Oral QPC supper  . timolol  1 drop Right Eye BID   Continuous Infusions: . ampicillin-sulbactam (UNASYN) IV 3 g (08/27/20 0622)     LOS: 5 days   Time spent: 35 minutes   Azucena FallenWilliam C Durand Wittmeyer, DO [facility]  Pager: Secure chat  08/27/2020, 7:19 AM

## 2020-08-27 NOTE — NC FL2 (Signed)
[place] MEDICAID FL2 LEVEL OF CARE SCREENING TOOL     IDENTIFICATION  Patient Name: Daniel Morrow Birthdate: Aug 28, 1941 Sex: male Admission Date (Current Location): 08/22/2020  [facility] and [place] Number:  Producer, television/film/video and Address:  [facility], [address], [place], [place] [address]      Provider Number: [id number]  Attending Physician Name and Address:  Azucena Fallen, MD  Relative Name and Phone Number:  Wise Fees [phone]    Current Level of Care: Hospital Recommended Level of Care: Skilled Nursing Facility Prior Approval Number:    Date Approved/Denied:   PASRR Number:    Discharge Plan: SNF    Current Diagnoses: Patient Active Problem List   Diagnosis Date Noted  . Altered mental status   . Urinary tract infection without hematuria   . Frontotemporal dementia (HCC)   . At risk for aspiration   . Goals of care, counseling/discussion   . Palliative care by specialist   . DNR (do not resuscitate)   . Encephalopathy 08/22/2020  . Hypoxia 10/04/2019  . Glaucoma 10/04/2019  . Foreign body in esophagus   . Esophageal stenosis   . Hiatal hernia   . Chronic anticoagulation   . Esophageal dysphagia   . Esophageal stricture   . Shortness of breath at rest 09/12/2018  . Shortness of breath 09/12/2018  . Vision, loss, sudden 06/03/2016  . CRAO (central retinal artery occlusion) 06/03/2016  . Alzheimer's disease (HCC) 01/16/2016  . Epididymo-orchitis 12/02/2013  . Orchitis and epididymitis 12/02/2013  . Mild cognitive impairment, so stated 02/17/2013  . CVA (cerebral infarction) 02/17/2013  . 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 Hypertension 04/03/2012  . Type II diabetes mellitus (HCC) 04/03/2012  . Hyperlipidemia     Orientation RESPIRATION BLADDER Height & Weight       (Unable to Assess)  Normal External catheter Weight: 164 lb 3.9 oz (74.5 kg) Height:  5\' 11"  (180.3 cm)  BEHAVIORAL SYMPTOMS/MOOD NEUROLOGICAL BOWEL NUTRITION STATUS      Incontinent Diet (See DC summary)  AMBULATORY STATUS COMMUNICATION OF NEEDS Skin   Extensive Assist Does not communicate Skin abrasions (Right and Left Arm Hand and Leg)                       Personal Care Assistance Level of Assistance  Bathing, Feeding, Dressing Bathing Assistance: Maximum assistance Feeding assistance: Maximum assistance Dressing Assistance: Maximum assistance     Functional Limitations Info  Sight, Hearing, Speech Sight Info:  (Unable to Assess) Hearing Info:  (Unable to Assess) Speech Info: Impaired )    SPECIAL CARE FACTORS FREQUENCY  PT (By licensed PT), OT (By licensed OT), Speech therapy     PT Frequency: 5x week OT Frequency: 5x week     Speech Therapy Frequency: 5x week      Contractures Contractures Info: Not present    Additional Factors Info  Code Status, Allergies, Psychotropic, Insulin Sliding Scale Code Status Info: DNR Allergies Info: NKA Psychotropic Info: Zoloft (Sertraline) Insulin Sliding Scale Info: Insulin Aspart (Novolog) 0-9 U 3x daily       Current Medications (08/27/2020):  This is the current hospital active medication list Current Facility-Administered Medications  Medication Dose Route Frequency Provider Last Rate Last Admin  . acetaminophen (TYLENOL) tablet 650 mg  650 mg Oral Q6H PRN 14/02/2020, MD  650 mg at 08/26/20 2101   Or  . acetaminophen (TYLENOL) suppository 650 mg  650 mg Rectal Q6H PRN Mikey College T, MD      . amLODipine (NORVASC) tablet 10 mg  10 mg Oral Daily Lynn Ito, MD   10 mg at 08/26/20 0830  . Ampicillin-Sulbactam (UNASYN) 3 g in sodium chloride 0.9 % 100 mL IVPB  3 g Intravenous Q6H Pierce, Dwayne A, RPH 200 mL/hr at 08/27/20 0622 3 g at 08/27/20 0622  . brimonidine (ALPHAGAN) 0.2 % ophthalmic solution 1 drop  1  drop Right Eye BID Lynn Ito, MD   1 drop at 08/26/20 2343  . enoxaparin (LOVENOX) injection 40 mg  40 mg Subcutaneous Q24H Mikey College T, MD   40 mg at 08/26/20 0829  . insulin aspart (novoLOG) injection 0-15 Units  0-15 Units Subcutaneous TID WC Azucena Fallen, MD   3 Units at 08/27/20 [phone]  . insulin aspart (novoLOG) injection 0-5 Units  0-5 Units Subcutaneous QHS Azucena Fallen, MD   4 Units at 08/26/20 2325  . latanoprost (XALATAN) 0.005 % ophthalmic solution 1 drop  1 drop Right Eye Lance Morin, MD   1 drop at 08/26/20 2343  . lisinopril (ZESTRIL) tablet 2.5 mg  2.5 mg Oral BID Lynn Ito, MD   2.5 mg at 08/26/20 2326  . QUEtiapine (SEROQUEL) tablet 25 mg  25 mg Oral BID Azucena Fallen, MD      . Resource ThickenUp Clear   Oral PRN Lynn Ito, MD      . sertraline (ZOLOFT) tablet 150 mg  150 mg Oral Daily Lynn Ito, MD   150 mg at 08/26/20 0830  . tamsulosin (FLOMAX) capsule 0.4 mg  0.4 mg Oral QPC supper Mikey College T, MD   0.4 mg at 08/25/20 1651  . timolol (TIMOPTIC) 0.5 % ophthalmic solution 1 drop  1 drop Right Eye BID Lynn Ito, MD   1 drop at 08/26/20 2342     Discharge Medications: Please see discharge summary for a list of discharge medications.  Relevant Imaging Results:  Relevant Lab Results:   Additional Information SS# [id number]  [name], LCSWA

## 2020-08-27 NOTE — Progress Notes (Signed)
Inpatient Diabetes Program Recommendations  AACE/ADA: New Consensus Statement on Inpatient Glycemic Control   Target Ranges:  Prepandial:   less than 140 mg/dL      Peak postprandial:   less than 180 mg/dL (1-2 hours)      Critically ill patients:  140 - 180 mg/dL   Results for Daniel Morrow, Daniel Morrow (MRN [id number]) as of 08/27/2020 10:43  Ref. Range 08/26/2020 06:40 08/26/2020 12:26 08/26/2020 15:57 08/26/2020 17:44 08/26/2020 23:16 08/27/2020 06:19  Glucose-Capillary Latest Ref Range: 70 - 99 mg/dL 174 (H) 081 (H) 448 (H) 221 (H) 329 (H) 193 (H)   Review of Glycemic Control  Diabetes history: DM2 Outpatient Diabetes medications: Amaryl 4 mg daily, Ozempic 1 mg Qweek Current orders for Inpatient glycemic control: Novolog 0-15 units TID with meals, Novolog 0-5 units QHS  Inpatient Diabetes Program Recommendations:   Insulin: Please consider ordering Lantus 5 units daily and Novolog 3 units TID with meals for meal coverage if patient eats at least 50% of meals.  Thanks, Orlando Penner, RN, MSN, CDE Diabetes Coordinator Inpatient Diabetes Program [phone] (Team Pager from 8am to 5pm)

## 2020-08-28 LAB — GLUCOSE, CAPILLARY
Glucose-Capillary: 206 mg/dL — ABNORMAL HIGH (ref 70–99)
Glucose-Capillary: 342 mg/dL — ABNORMAL HIGH (ref 70–99)
Glucose-Capillary: 203 mg/dL — ABNORMAL HIGH (ref 70–99)
Glucose-Capillary: 339 mg/dL — ABNORMAL HIGH (ref 70–99)
Glucose-Capillary: 196 mg/dL — ABNORMAL HIGH (ref 70–99)

## 2020-08-28 NOTE — Progress Notes (Signed)
PROGRESS NOTE    Daniel Morrow  BJY:[id number] DOB: 09/03/1941 DOA: 08/22/2020 PCP: Kirby FunkGriffin, John, MD    Brief Narrative:  Daniel Morrow is a 79 y.o. male with medical history significant of frontotemporal dementia, recurrent UTIs, BPH noncompliant with medications, HTN, IIDM, chronic ambulatory dysfunction, depression anxiety, presented with fever, hypoxia. Patient nonverbal at baseline minimally responsive, most history provided by patient and wife at bedside. Wife reported patient has had several episodes of UTI this year after stopped taking his BPH medications. Each time when UTI happens, patient usually became less responsive. Baseline mental status wise, patient has had worsening of being responsive to outside stimulus, and recent month his mentation has significantly deteriorated. For last 4 weeks, patient has been bed bound and wheelchair bound, unable to transfer himself.  Compared to before he was able to use a walker despite being very wobbly and has sustained multiple falls.  12/3 - Palliative meeting with family -see note 12/4 - PT rec SNF - discussion with family about possible need for placement given ongoing improvement - essentially back to baseline 12/5 - No changes clinically -awaiting discharge to SNF. **EVENING UPDATE - patient had witnessed fall from bed with notable laceration to R supraorbital area - CT head personally reviewed with no large bleed/fracture - awaiting formal read from radiology. ED consulted to help with laceration repair.  12/6 - Shoulder imaging overnight negative - continues to be agitated but improved with ativan 1mg  - initiate BID low dose seroquel and attempt to limit benzos given patient's increased somnolence 12/7 - stable for discharge -appears to be back to baseline per wife at bedside.  Assessment & Plan:  Acute on chronic metabolic encephalopathy POA, resolving - Acute event likely aspiration pneumonia plus minus a recurrent UTI.   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 - Continue with IV antibiotics as below through 08/28/2020 - Blood cultures to date negative, negative urine culture so far - Palliative care following - appreciate their assistance - Family hopeful for SNF rehab disposition. He is enrolled in outpatient palliative through [facility] care connections and will follow. - Family open to further GOC/hospice discussion pending his ability to progress with rehab - PT recommends SNF -wife agrees, having difficulty managing patient at home by herself especially with new onset weakness - Fall precautions  Acute hypoxic respite failure/possible aspiration pneumonia, resolved - Likely recurrent aspirations prior to admission - O2 saturation above 92% on room air  - Continue Unasyn  - On dysphagia 1 diet  - Advanced risk for aspiration given his baseline mental status with acute events as outlined above - Speech following  Recurrent UTI - Renal us with prominent prostate  - Has history of BPH but he refuses to take meds per wife  - 2 weeks ago with Klebsiella pneumonia pansensitive  - 12/4-urine culture remains negative, blood cultures continue to be negative as well -Unasyn ongoing to treat UTI and aspiration pneumonia as above through 08/28/2020  Profound ambulatory dysfunction - Patient had fall night of 12/5 with supraorbital R eye lac requiring 5 stitches - CT head negative - Continue PT/OT - awaiting SNF placement  Frontotemporal dementia -Continue memantine Needs to follow-up with neurology as outpatient.  Patient was lost follow-up with neurologist as early this year -Continue memantine   IIDM type II, controlled Blood glucose stable Continue sliding scale insulin A1c 7.0  HTN-currently mildly elevated Continue amlodipine Continue  lisinopril 2.5 mg twice daily   DVT prophylaxis: Enoxaparin Code Status: DNR Family Communication: Spoke to wife via phone  Status is: Inpatient   Dispo: The patient is from: Home              Anticipated d/c is to: SNF              Anticipated d/c date is: 24-48h              Patient currently is medically stable for discharge, improving mental status, can transition to p.o. antibiotics at discharge if necessary  Consultants:   Palliative care  Procedures:   Antimicrobials:    flagyl -d/c  Ceftriaxone 1 dose d/c  Unasyn 12/2-12/7  Subjective: No acute issues or events overnight, tolerating p.o. well, wife at bedside this morning, states patient appears to be back to baseline, taking p.o. without difficulty, ambulating with assistance but otherwise review of systems markedly limited by patient's mental status baseline.  Objective: Vitals:   08/27/20 2028 08/27/20 2358 08/28/20 0406 08/28/20 0740  BP: (!) 167/91 117/67 140/78 (!) 156/89  Pulse: 94 72 74 84  Resp: 18 16 16 16   Temp: 99.3 F (37.4 C) 99.1 F (37.3 C) 98.4 F (36.9 C) 98.2 F (36.8 C)  TempSrc: Oral Oral Oral Oral  SpO2: 100% 100% 100% 98%  Weight:      Height:        Intake/Output Summary (Last 24 hours) at 08/28/2020 0742 Last data filed at 08/28/2020 0550 Gross per 24 hour  Intake 540 ml  Output 900 ml  Net -360 ml   Filed Weights   08/23/20 0900  Weight: 74.5 kg    Examination:  General: Somnolent, minimally arousable today HEENT: Right supraorbital laceration with 5 sutures in place, bandage clean dry intact.  Notable ecchymosis of the upper eyelid with resolving eyelid involvement. Neck:  Without mass or deformity.  Trachea is midline. Lungs:  Clear to auscultate bilaterally without rhonchi, wheeze, or rales. Heart:  Regular rate and rhythm.  Without murmurs, rubs, or gallops. Abdomen:  Soft, nontender, nondistended.  Without guarding or rebound. Extremities: Without cyanosis,  clubbing, edema, or obvious deformity. Vascular:  Dorsalis pedis and posterior tibial pulses palpable bilaterally.  Data Reviewed: I have personally reviewed following labs and imaging studies  CBC: Recent Labs  Lab 08/22/20 1133 08/23/20 1015  WBC 10.6* 6.8  HGB 11.2* 11.0*  HCT 34.6* 34.8*  MCV 88.0 87.9  PLT 214 199   Basic Metabolic Panel: Recent Labs  Lab 08/22/20 1133  NA 139  K 3.6  CL 100  CO2 27  GLUCOSE 154*  BUN 13  CREATININE 1.17  CALCIUM 8.8*   GFR: Estimated Creatinine Clearance: 55.7 mL/min (by C-G formula based on SCr of 1.17 mg/dL). Liver Function Tests: No results for input(s): AST, ALT, ALKPHOS, BILITOT, PROT, ALBUMIN in the last 168 hours. Recent Labs  Lab 08/22/20 1133  LIPASE 23   No results for input(s): AMMONIA in the last 168 hours. Coagulation Profile: No results for input(s): INR, PROTIME in the last 168 hours. Cardiac Enzymes: No results for input(s): CKTOTAL, CKMB, CKMBINDEX, TROPONINI in the last 168 hours. BNP (last 3 results) No results for input(s): PROBNP in the last 8760 hours. HbA1C: No results for input(s): HGBA1C in the last 72 hours. CBG: Recent Labs  Lab 08/27/20 0619 08/27/20 1134 08/27/20 1656 08/27/20 2130 08/28/20 0627  GLUCAP 193* 230* 158* 320* 206*  Lipid Profile: No results for input(s): CHOL, HDL, LDLCALC, TRIG, CHOLHDL, LDLDIRECT in the last 72 hours. Thyroid Function Tests: No results for input(s): TSH, T4TOTAL, FREET4, T3FREE, THYROIDAB in the last 72 hours. Anemia Panel: No results for input(s): VITAMINB12, FOLATE, FERRITIN, TIBC, IRON, RETICCTPCT in the last 72 hours. Sepsis Labs: Recent Labs  Lab 08/22/20 1324  LATICACIDVEN 0.7    Recent Results (from the past 240 hour(s))  Urine C&S     Status: None   Collection Time: 08/22/20 11:24 AM   Specimen: Urine, Catheterized  Result Value Ref Range Status   Specimen Description URINE, CATHETERIZED  Final   Special Requests Immunocompromised   Final   Culture   Final    NO GROWTH Performed at [facility], [address], [place], [place] [address]    Report Status 08/23/2020 FINAL  Final  Culture, blood (routine x 2)     Status: None   Collection Time: 08/22/20 12:31 PM   Specimen: BLOOD  Result Value Ref Range Status   Specimen Description BLOOD SITE NOT SPECIFIED  Final   Special Requests   Final    BOTTLES DRAWN AEROBIC AND ANAEROBIC Blood Culture results may not be optimal due to an excessive volume of blood received in culture bottles   Culture   Final    NO GROWTH 5 DAYS Performed at [facility], [address] [place], [place] [address]    Report Status 08/27/2020 FINAL  Final  Culture, blood (routine x 2)     Status: None   Collection Time: 08/22/20 12:31 PM   Specimen: BLOOD  Result Value Ref Range Status   Specimen Description BLOOD SITE NOT SPECIFIED  Final   Special Requests   Final    BOTTLES DRAWN AEROBIC AND ANAEROBIC Blood Culture results may not be optimal due to an excessive volume of blood received in culture bottles   Culture   Final    NO GROWTH 5 DAYS Performed at [facility], [address] [place], [place] [address]    Report Status 08/27/2020 FINAL  Final         Radiology Studies: DG Shoulder 1V Right  Result Date: 08/26/2020 CLINICAL DATA:  Pain EXAM: RIGHT SHOULDER - 1 VIEW; RIGHT HUMERUS - 2+ VIEW COMPARISON:  None. FINDINGS: There is no evidence of fracture or dislocation. There is no evidence of arthropathy or other focal bone abnormality. Soft tissues are unremarkable. IMPRESSION: No acute displaced fracture or dislocation. Evaluation is limited by single view technique of the right shoulder. Electronically Signed   By: Katherine Mantle M.D.   On: 08/26/2020 21:52   CT HEAD WO CONTRAST  Result Date: 08/26/2020 CLINICAL DATA:  Facial trauma. Additional history provided: Fall on unit, laceration to forehead. EXAM: CT HEAD WITHOUT CONTRAST TECHNIQUE:  Contiguous axial images were obtained from the base of the skull through the vertex without intravenous contrast. COMPARISON:  Prior head CT examinations 08/22/2020 and earlier. FINDINGS: Brain: Mild cerebral and cerebellar atrophy. Ill-defined hypoattenuation within the cerebral white matter is nonspecific, but compatible chronic small vessel ischemic disease. Redemonstrated small chronic cerebellar infarcts. There is no acute intracranial hemorrhage. No demarcated cortical infarct. No extra-axial fluid collection. No midline shift. Vascular: No hyperdense vessel.  Atherosclerotic calcifications. Skull: No calvarial fracture. Redemonstrated chronic fractures of the left zygomatic arch. Sinuses/Orbits: Visualized orbits show no acute finding. Chronic left globe findings suggestive of vitreous detachment (series 3, image 10). Mild scattered paranasal sinus mucosal thickening.  Small bilateral maxillary sinus mucous retention cysts. Other: Right forehead soft tissue swelling. IMPRESSION: No evidence of acute intracranial abnormality. Right forehead soft tissue swelling. Stable parenchymal atrophy and chronic small vessel ischemic disease. Redemonstrated small chronic cerebellar infarcts. Electronically Signed   By: Jackey Loge DO   On: 08/26/2020 18:21   DG Humerus Right  Result Date: 08/26/2020 CLINICAL DATA:  Pain EXAM: RIGHT SHOULDER - 1 VIEW; RIGHT HUMERUS - 2+ VIEW COMPARISON:  None. FINDINGS: There is no evidence of fracture or dislocation. There is no evidence of arthropathy or other focal bone abnormality. Soft tissues are unremarkable. IMPRESSION: No acute displaced fracture or dislocation. Evaluation is limited by single view technique of the right shoulder. Electronically Signed   By: Katherine Mantle M.D.   On: 08/26/2020 21:52   Scheduled Meds: . amLODipine  10 mg Oral Daily  . brimonidine  1 drop Right Eye BID  . enoxaparin (LOVENOX) injection  40 mg Subcutaneous Q24H  . insulin aspart  0-15  Units Subcutaneous TID WC  . insulin aspart  0-5 Units Subcutaneous QHS  . latanoprost  1 drop Right Eye QHS  . lisinopril  2.5 mg Oral BID  . QUEtiapine  25 mg Oral BID  . sertraline  150 mg Oral Daily  . tamsulosin  0.4 mg Oral QPC supper  . timolol  1 drop Right Eye BID   Continuous Infusions:    LOS: 6 days   Time spent: 25 minutes   Azucena Fallen, DO [facility]  Pager: Secure chat  08/28/2020, 7:42 AM

## 2020-08-28 NOTE — Progress Notes (Signed)
Physical Therapy Treatment Patient Details Name: Daniel Morrow MRN: [id number] DOB: [birth year] Today's Date: 08/28/2020    History of Present Illness 79 y.o. male  with past medical history of frontotemporal dementia, recurrent UTI's, BPH noncompliant with medications, HTN, DM type 2, depression, anxiety admitted on 08/22/2020 with altered mental status. Pt experiencing multiple falls over the last few weeks. Pt found to have aspiration PNA, UTI, and acute on chronic encephalopathy.    PT Comments    Pt was able to amb 120' with RW and modAx2 today. Pt remained non-verbal but did follow simple commands majority of time. Pt remains at increased falls risk as pt just had a fall on 12/5 in hospital. Pt does require 24/7 assist not supervision and wife unable to provide that level of assist. Pt to benefit from SNF upon d/c to maximize functional recovery and to maintain strength and activity level. Acute PT to cont to follow.    Follow Up Recommendations  SNF;Supervision/Assistance - 24 hour     Equipment Recommendations  Wheelchair (measurements PT)    Recommendations for Other Services       Precautions / Restrictions Precautions Precautions: Fall Precaution Comments: mostly nonverbal at baseline Restrictions Weight Bearing Restrictions: No    Mobility  Bed Mobility Overal bed mobility: Needs Assistance Bed Mobility: Supine to Sit     Supine to sit: Mod assist     General bed mobility comments: pt inititiated LE movement to EOB with verbal cues, modA for trunk elevation  Transfers Overall transfer level: Needs assistance Equipment used: Rolling walker (2 wheeled) Transfers: Sit to/from Stand Sit to Stand: Min assist;+2 safety/equipment         General transfer comment: tactile and verbal cues, pt powered up well, minAx2 for lines and to steady during transition of hands, pt with l lateral lean initially with very narrow base of support, pt did widen stance to command  but didn't maintain  Ambulation/Gait Ambulation/Gait assistance: Mod assist;+2 physical assistance Gait Distance (Feet): 120 Feet Assistive device: Rolling walker (2 wheeled) Gait Pattern/deviations: Step-through pattern;Decreased stride length;Shuffle;Narrow base of support Gait velocity: decreased Gait velocity interpretation: <1.31 ft/sec, indicative of household ambulator General Gait Details: pt initially leaning on therapist to the L with narrowed stance on R side of walker, PT changed position to be posterior with tactile cues on hips, pt with improved midline posture, tech to provide assist with walker management and line management anteriorly   Stairs             Wheelchair Mobility    Modified Rankin (Stroke Patients Only)       Balance Overall balance assessment: Needs assistance Sitting-balance support: No upper extremity supported Sitting balance-Leahy Scale: Fair Sitting balance - Comments: supervision for safety   Standing balance support: Bilateral upper extremity supported Standing balance-Leahy Scale: Poor Standing balance comment: dependent on RW or external support                            Cognition Arousal/Alertness: Awake/alert Behavior During Therapy: Flat affect Overall Cognitive Status: History of cognitive impairments - at baseline                                 General Comments: pt able to follow simple commands majority of time despite flat affect, impaired vision and being non-verbal. pt with no verbal responses during session  Exercises      General Comments General comments (skin integrity, edema, etc.): VSS, pt did have BM during ambulation, pt stood for hygiene, pt remained non-verbal t/o session. wife reports pt is unable to see out of L eye for the last 4 years, pt with R eye swelling from fall on 12/5      Pertinent Vitals/Pain Pain Assessment: Faces Faces Pain Scale: No hurt    Home Living                       Prior Function            PT Goals (current goals can now be found in the care plan section) Progress towards PT goals: Progressing toward goals    Frequency    Min 2X/week      PT Plan Current plan remains appropriate    Co-evaluation              AM-PAC PT "6 Clicks" Mobility   Outcome Measure  Help needed turning from your back to your side while in a flat bed without using bedrails?: A Lot Help needed moving from lying on your back to sitting on the side of a flat bed without using bedrails?: A Lot Help needed moving to and from a bed to a chair (including a wheelchair)?: A Lot Help needed standing up from a chair using your arms (e.g., wheelchair or bedside chair)?: A Lot Help needed to walk in hospital room?: A Lot Help needed climbing 3-5 steps with a railing? : A Lot 6 Click Score: 12    End of Session Equipment Utilized During Treatment: Gait belt Activity Tolerance: Patient tolerated treatment well Patient left: in bed;with call bell/phone within reach;with chair alarm set;with nursing/sitter in room;with family/visitor present Nurse Communication: Mobility status PT Visit Diagnosis: Unsteadiness on feet (R26.81)     Time: 9379-0240 PT Time Calculation (min) (ACUTE ONLY): 25 min  Charges:  $Gait Training: 23-37 mins                     Lewis Shock, PT, DPT Acute Rehabilitation Services Pager #: [phone] Office #: [phone]    Iona Hansen 08/28/2020, 10:29 AM

## 2020-08-28 NOTE — Plan of Care (Signed)

## 2020-08-28 NOTE — Progress Notes (Signed)
Inpatient Diabetes Program Recommendations  AACE/ADA: New Consensus Statement on Inpatient Glycemic Control   Target Ranges:  Prepandial:   less than 140 mg/dL      Peak postprandial:   less than 180 mg/dL (1-2 hours)      Critically ill patients:  140 - 180 mg/dL   Results for Daniel Morrow, Daniel Morrow (MRN [id number]) as of 08/28/2020 11:12  Ref. Range 08/27/2020 06:19 08/27/2020 11:34 08/27/2020 16:56 08/27/2020 21:30 08/28/2020 06:27  Glucose-Capillary Latest Ref Range: 70 - 99 mg/dL 903 (H) 833 (H) 383 (H) 320 (H) 206 (H)   Review of Glycemic Control  Diabetes history: DM2 Outpatient Diabetes medications: Amaryl 4 mg daily, Ozempic 1 mg Qweek Current orders for Inpatient glycemic control: Novolog 0-15 units TID with meals, Novolog 0-5 units QHS  Inpatient Diabetes Program Recommendations:   Insulin: Please consider ordering Lantus 5 units daily and Novolog 3 units TID with meals for meal coverage if patient eats at least 50% of meals.  Thanks, Orlando Penner, RN, MSN, CDE Diabetes Coordinator Inpatient Diabetes Program [phone] (Team Pager from 8am to 5pm)

## 2020-08-28 NOTE — Progress Notes (Signed)
  Speech Language Pathology Treatment: Dysphagia  Patient Details Name: Daniel Morrow MRN: [id number] DOB: 06/12/1941 Today's Date: 08/28/2020 Time: 1010-1030 SLP Time Calculation (min) (ACUTE ONLY): 20 min  Assessment / Plan / Recommendation Clinical Impression  Patient seen to address dysphagia goals with spouse present in room. Spouse stated that patient ate breakfast well this morning and seems to be better today as compared to yesterday. Patient was sitting in recliner when SLP entered room and continued to do so during PO intake. SLP handed patient a cup of nectar thick juice and he was able to self-feed, taking what appeared to be small to medium sized sips and was taking single sips each time. Patient not impulsive with intake during PO intake today. No coughing, throat clearing or other overt s/s that could be indicative of pharyngeal phase dysphagia were observed. (throat clearing noted during PO treatment session previous date.)   HPI HPI: Pt is a 79 y.o. male with medical history significant of frontotemporal dementia, recurrent UTIs, BPH noncompliant with medications, HTN, IIDM, chronic ambulatory dysfunction, depression anxiety, who presented with fever, hypoxia, and AMS. Per H&P, pt nonverbal at baseline and minimally responsive. Note from ED MD stated "He has history of prior stroke and is minimally verbal, however has been less vocal over the past 2 days." CT head: No acute intracranial abnormality. CXR was negative for acute changes.       SLP Plan  Continue with current plan of care       Recommendations  Diet recommendations: Nectar-thick liquid;Dysphagia 1 (puree) Liquids provided via: Cup;Teaspoon Medication Administration: Crushed with puree Supervision: Full supervision/cueing for compensatory strategies Postural Changes and/or Swallow Maneuvers: Seated upright 90 degrees                Oral Care Recommendations: Oral care BID Follow up Recommendations:  None SLP Visit Diagnosis: Dysphagia, unspecified (R13.10) Plan: Continue with current plan of care       GO                Angela Nevin, MA, CCC-SLP Speech Therapy [facility]

## 2020-08-29 DIAGNOSIS — J9601 Acute respiratory failure with hypoxia: Secondary | ICD-10-CM

## 2020-08-29 DIAGNOSIS — E1165 Type 2 diabetes mellitus with hyperglycemia: Secondary | ICD-10-CM

## 2020-08-29 DIAGNOSIS — J690 Pneumonitis due to inhalation of food and vomit: Secondary | ICD-10-CM

## 2020-08-29 DIAGNOSIS — G9341 Metabolic encephalopathy: Secondary | ICD-10-CM

## 2020-08-29 LAB — GLUCOSE, CAPILLARY
Glucose-Capillary: 175 mg/dL — ABNORMAL HIGH (ref 70–99)
Glucose-Capillary: 264 mg/dL — ABNORMAL HIGH (ref 70–99)
Glucose-Capillary: 183 mg/dL — ABNORMAL HIGH (ref 70–99)
Glucose-Capillary: 276 mg/dL — ABNORMAL HIGH (ref 70–99)

## 2020-08-29 LAB — RESP PANEL BY RT-PCR (FLU A&B, COVID) ARPGX2
Influenza A by PCR: NEGATIVE
Influenza B by PCR: NEGATIVE
SARS Coronavirus 2 by RT PCR: NEGATIVE

## 2020-08-29 MED ORDER — INSULIN ASPART 100 UNIT/ML ~~LOC~~ SOLN
5.0000 [IU] | Freq: Three times a day (TID) | SUBCUTANEOUS | Status: DC
  Administered 2020-08-29 – 2020-09-06 (×23): 5 [IU] via SUBCUTANEOUS

## 2020-08-29 NOTE — Progress Notes (Signed)
Occupational Therapy Treatment Patient Details Name: Daniel Morrow MRN: [id number] DOB: [birth year] Today's Date: 08/29/2020    History of present illness 79 y.o. male  with past medical history of frontotemporal dementia, recurrent UTI's, BPH noncompliant with medications, HTN, DM type 2, depression, anxiety admitted on 08/22/2020 with altered mental status. Pt experiencing multiple falls over the last few weeks. Pt found to have aspiration PNA, UTI, and acute on chronic encephalopathy.   OT comments  Wife very much wants the best care for patient and expressing that she can not select a one star facility for patient. Family will need (A) with maximized care at home if SNF placement not found. Recommendation for palliative services to help maximize resources in the home. Recommendation for hospital bed, wheelchair for transport to bathroom, RW for stand pivot to 3n1 for home equipment needs. Foam pad for wheelchair/ chair sitting due to decreased skin integrity with weight lose. Recommend SNF but helping for d/c home with recommendation list.   Follow Up Recommendations  SNF    Equipment Recommendations  Wheelchair (measurements OT);Wheelchair cushion (measurements OT);Hospital bed;3 in 1 bedside commode (possible d/c home with caregivers)    Recommendations for Other Services Other (comment) (palliative to get more (A) at home)    Precautions / Restrictions Precautions Precautions: Fall Precaution Comments: mostly nonverbal at baseline       Mobility Bed Mobility Overal bed mobility: Needs Assistance Bed Mobility: Sit to Supine       Sit to supine: Mod assist   General bed mobility comments: (A) to transfer into side lying and then return to supine. Pt was sitting eob with one leg out of rails on arrival showing abilty to sit eob without (A) even with mittens  Transfers Overall transfer level: Needs assistance Equipment used: Rolling walker (2 wheeled) Transfers: Sit to/from  Stand Sit to Stand: Min assist         General transfer comment: narrowed base of support and needs increased time to initiate motor planning but able to take decreased gait velocity steps with RW    Balance Overall balance assessment: Needs assistance;History of Falls Sitting-balance support: Bilateral upper extremity supported;Feet supported Sitting balance-Leahy Scale: Fair                                     ADL either performed or assessed with clinical judgement   ADL Overall ADL's : Needs assistance/impaired                         Toilet Transfer: Minimal assistance   Toileting- Clothing Manipulation and Hygiene: Maximal assistance Toileting - Clothing Manipulation Details (indicate cue type and reason): standing for peri care and doff of wet pad     Functional mobility during ADLs: Moderate assistance;Rolling walker General ADL Comments: pt able to progress with transfer and simulate a toilet transfer this session. wife arriving at end of session with quesitons. wife provided handout on FTD support group      Vision       Perception     Praxis      Cognition Arousal/Alertness: Awake/alert Behavior During Therapy: Flat affect Overall Cognitive Status: History of cognitive impairments - at baseline                                 General  Comments: following simple commands, nodding head yes at one point in session in response, pt demonstrates thumbs up, pt smiling at therapist at end of session         Exercises     Shoulder Instructions       General Comments VSS    Pertinent Vitals/ Pain       Pain Assessment: No/denies pain  Home Living                                          Prior Functioning/Environment              Frequency  Min 2X/week        Progress Toward Goals  OT Goals(current goals can now be found in the care plan section)  Progress towards OT goals:  Progressing toward goals  Acute Rehab OT Goals Patient Stated Goal: per wife to go to facility that is not 1 star if not ill have to take him home OT Goal Formulation: With patient Time For Goal Achievement: 09/09/20 Potential to Achieve Goals: Fair ADL Goals Pt Will Perform Grooming: with min assist;sitting Pt Will Perform Lower Body Bathing: with mod assist;sit to/from stand Pt Will Perform Upper Body Dressing: with min assist;sitting Pt Will Perform Lower Body Dressing: with mod assist;sit to/from stand Pt Will Transfer to Toilet: with min assist;stand pivot transfer;bedside commode;ambulating Pt Will Perform Toileting - Clothing Manipulation and hygiene: with mod assist;sit to/from stand  Plan Discharge plan remains appropriate    Co-evaluation                 AM-PAC OT "6 Clicks" Daily Activity     Outcome Measure   Help from another person eating meals?: A Lot Help from another person taking care of personal grooming?: A Lot Help from another person toileting, which includes using toliet, bedpan, or urinal?: Total Help from another person bathing (including washing, rinsing, drying)?: A Lot Help from another person to put on and taking off regular upper body clothing?: A Lot Help from another person to put on and taking off regular lower body clothing?: A Lot 6 Click Score: 11    End of Session Equipment Utilized During Treatment: Rolling walker;Gait belt  OT Visit Diagnosis: Other symptoms and signs involving cognitive function;Other abnormalities of gait and mobility (R26.89);Muscle weakness (generalized) (M62.81)   Activity Tolerance Patient tolerated treatment well   Patient Left in bed;with call bell/phone within reach;with restraints reapplied;with family/visitor present;with bed alarm set   Nurse Communication Mobility status;Precautions        Time: 1531-1601 OT Time Calculation (min): 30 min  Charges: OT General Charges $OT Visit: 1 Visit OT  Treatments $Self Care/Home Management : 23-37 mins   Brynn, OTR/L  Acute Rehabilitation Services Pager: [phone] Office: [phone] .    Mateo Flow 08/29/2020, 4:41 PM

## 2020-08-29 NOTE — Plan of Care (Signed)

## 2020-08-29 NOTE — Hospital Course (Addendum)
79 year old man PMH frontotemporal dementia, as well as being wheelchair and bedbound for the last 4 weeks prior to presentation, presented with fever and hypoxia.  Admitted for acute on chronic metabolic encephalopathy thought secondary to aspiration pneumonia.  Plan for SNF.  A & P  Frontotemporal dementia with associated dysphagia --Continue quetiapine, follow-up with neurology as an outpatient.   --Continue diet as per speech therapy.  Diabetes mellitus type 2 --Blood sugars now appear to be well controlled.  Continue Lantus at current dose, continue sliding scale insulin and meal coverage  Profound ambulatory dysfunction, fall in hospital 12/5 with supraorbital right eye laceration requiring 5 sutures.  CT head negative. --Remove sutures 12/12  RESOLVED Acute metabolic encephalopathy present on admission. Secondary to aspiration pneumonia, UTI on admission, complicated by dementia. Acute hypoxic respiratory failure secondary to aspiration pneumonia Recurrent UTI

## 2020-08-29 NOTE — TOC Progression Note (Signed)
Transition of Care [facility]) - Progression Note    Patient Details  Name: Daniel Morrow MRN: [id number] Date of Birth: November 28, 1940  Transition of [facility]) CM/SW Contact  Levada Schilling Phone Number: 08/29/2020, 12:43 PM  Clinical Narrative:    CSW faxed requested information to [employer] .  TOC Team will continue to assist in disposition planning.   Expected Discharge Plan: Skilled Nursing Facility Barriers to Discharge: Continued Medical Work up, English as a second language teacher, SNF Pending bed offer  Expected Discharge Plan and Services Expected Discharge Plan: Skilled Nursing Facility     Post Acute Care Choice: Skilled Nursing Facility Living arrangements for the past 2 months: Single Family Home                                       Social Determinants of Health (SDOH) Interventions    Readmission Risk Interventions No flowsheet data found.

## 2020-08-29 NOTE — Progress Notes (Signed)
PROGRESS NOTE  Daniel Morrow GEX:[id number] DOB: 1940/10/06 DOA: 08/22/2020 PCP: Kirby Funk, MD  Brief History   79 year old man PMH frontotemporal dementia, as well as being wheelchair and bedbound for the last 4 weeks prior to presentation, presented with fever and hypoxia.  Admitted for acute on chronic metabolic encephalopathy thought secondary to aspiration pneumonia.  Plan for SNF.   A & P  Acute metabolic encephalopathy present on admission --Per chart thought to be at baseline --Secondary to aspiration pneumoni, possibly UTI complicated by frontotemporal dementia --Completed antibiotics  Acute hypoxic respiratory failure secondary to aspiration pneumonia --Resolved, completed antibiotics --Continue diet as per speech therapy  Recurrent UTI, BPH --Completed antibiotics  Profound ambulatory dysfunction, fall in hospital 12/5 with supraorbital right eye laceration requiring 5 sutures.  CT head negative. --Remove sutures approximately 12/10-12  Frontotemporal dementia with associated dysphagia --Continue memantine, follow-up with neurology as an outpatient --Continue dysphagia 1 diet with nectar thick liquids, medications crushed with pure  Diabetes mellitus type 2 --Somewhat hyperglycemic.  Will start on Novolog meal coverage 3 units.  Disposition Plan:  Discussion: as above  Status is: Inpatient  Remains inpatient appropriate because:Unsafe d/c plan   Dispo:  Patient From: Home  Planned Disposition: Skilled Nursing Facility  Expected discharge date: 08/31/20  Medically stable for discharge: Yes  DVT prophylaxis: enoxaparin (LOVENOX) injection 40 mg Start: 08/22/20 1615   Code Status: DNR Family Communication:   Brendia Sacks, MD  [facility] Direct contact: see [email or web address] (further directions at bottom of note if needed) 7PM-7AM contact night coverage as at bottom of note 08/29/2020, 2:11 PM  LOS: 7 days   Significant Hospital Events   .     Consults:  .    Procedures:  .   Significant Diagnostic Tests:  Marland Kitchen    Micro Data:  .    Antimicrobials:  .   Interval History/Subjective  CC: f/u fall  [place] per staff Unable to offer history Ate ok today  Objective   Vitals:  Vitals:   08/29/20 0342 08/29/20 0833  BP: (!) 141/72 (!) 141/83  Pulse: 61 65  Resp: 18 16  Temp: 98 F (36.7 C) 98.2 F (36.8 C)  SpO2:  96%    Exam:  Constitutional:   . Appears calm, mildly restless Eyes:  . Closed . Right brow sutured ENMT:  . Cannot assess hearing Respiratory:  . CTA bilaterally, no w/r/r.  . Respiratory effort normal.  Cardiovascular:  . RRR, no m/r/g . No LE extremity edema   Psychiatric:  . Mental status . Cannot assess mood or affect  I have personally reviewed the following:   Today's Data  . CBG stable   Scheduled Meds: . amLODipine  10 mg Oral Daily  . brimonidine  1 drop Right Eye BID  . enoxaparin (LOVENOX) injection  40 mg Subcutaneous Q24H  . insulin aspart  0-15 Units Subcutaneous TID WC  . insulin aspart  0-5 Units Subcutaneous QHS  . insulin aspart  5 Units Subcutaneous TID WC  . latanoprost  1 drop Right Eye QHS  . lisinopril  2.5 mg Oral BID  . QUEtiapine  25 mg Oral BID  . sertraline  150 mg Oral Daily  . tamsulosin  0.4 mg Oral QPC supper  . timolol  1 drop Right Eye BID   Continuous Infusions:  Principal Problem:   Aspiration pneumonia (HCC) Active Problems:   Type II diabetes mellitus (HCC)   Encephalopathy   Urinary tract infection without hematuria  Frontotemporal dementia (HCC)   At risk for aspiration   Goals of care, counseling/discussion   Palliative care by specialist   DNR (do not resuscitate)   Acute hypoxemic respiratory failure (HCC)   Acute metabolic encephalopathy   LOS: 7 days   How to contact the [facility] Attending or Consulting provider 7A - 7P or covering provider during after hours 7P -7A, for this patient?  1. Check the care team in [facility] and  look for a) attending/consulting TRH provider listed and b) the [facility] team listed 2. Log into [email or web address] and use [place]'s universal password to access. If you do not have the password, please contact the hospital operator. 3. Locate the [facility] provider you are looking for under [facility] and page to a number that you can be directly reached. 4. If you still have difficulty reaching the provider, please page the [facility] (Director on Call) for the Hospitalists listed on amion for assistance.

## 2020-08-29 NOTE — Progress Notes (Signed)
Inpatient Diabetes Program Recommendations  AACE/ADA: New Consensus Statement on Inpatient Glycemic Control  Target Ranges:  Prepandial:   less than 140 mg/dL      Peak postprandial:   less than 180 mg/dL (1-2 hours)      Critically ill patients:  140 - 180 mg/dL   Results for Daniel Morrow, Daniel Morrow (MRN [id number]) as of 08/29/2020 08:59  Ref. Range 08/28/2020 06:27 08/28/2020 12:14 08/28/2020 16:22 08/28/2020 19:42 08/28/2020 21:37 08/29/2020 06:24  Glucose-Capillary Latest Ref Range: 70 - 99 mg/dL 734 (H) 287 (H) 681 (H) 339 (H) 196 (H) 175 (H)   Review of Glycemic Control  Diabetes history:DM2 Outpatient Diabetes medications:Amaryl 4 mg daily, Ozempic 1 mg Qweek Current orders for Inpatient glycemic control:Novolog 0-15 units TID with meals, Novolog 0-5 units QHS  Inpatient Diabetes Program Recommendations:  Insulin: Please consider ordering Novolog 5 units TID with meals for meal coverage if patient eats at least 50% of meals.  Thanks, Orlando Penner, RN, MSN, CDE Diabetes Coordinator Inpatient Diabetes Program [phone] (Team Pager from 8am to 5pm)

## 2020-08-30 ENCOUNTER — Encounter (HOSPITAL_COMMUNITY): Payer: Self-pay | Admitting: Internal Medicine

## 2020-08-30 ENCOUNTER — Other Ambulatory Visit: Payer: Self-pay

## 2020-08-30 LAB — GLUCOSE, CAPILLARY
Glucose-Capillary: 218 mg/dL — ABNORMAL HIGH (ref 70–99)
Glucose-Capillary: 239 mg/dL — ABNORMAL HIGH (ref 70–99)
Glucose-Capillary: 298 mg/dL — ABNORMAL HIGH (ref 70–99)
Glucose-Capillary: 279 mg/dL — ABNORMAL HIGH (ref 70–99)

## 2020-08-30 MED ORDER — INSULIN GLARGINE 100 UNIT/ML ~~LOC~~ SOLN
5.0000 [IU] | Freq: Every day | SUBCUTANEOUS | Status: DC
  Administered 2020-08-31: 5 [IU] via SUBCUTANEOUS
  Filled 2020-08-30 (×2): qty 0.05

## 2020-08-30 MED ORDER — INSULIN GLARGINE 100 UNIT/ML ~~LOC~~ SOLN
3.0000 [IU] | Freq: Every day | SUBCUTANEOUS | Status: DC
  Filled 2020-08-30: qty 0.03

## 2020-08-30 MED ORDER — LORAZEPAM 2 MG/ML IJ SOLN
0.5000 mg | Freq: Once | INTRAMUSCULAR | Status: DC | PRN
  Filled 2020-08-30: qty 1

## 2020-08-30 NOTE — Progress Notes (Signed)
  Speech Language Pathology Treatment: Dysphagia  Patient Details Name: Daniel Morrow MRN: [id number] DOB: 1941/02/08 Today's Date: 08/30/2020 Time: 2694-8546 SLP Time Calculation (min) (ACUTE ONLY): 15 min  Assessment / Plan / Recommendation Clinical Impression  Pt was seen for dysphagia treatment with his wife present. He was alert and cooperative during the session. Pt's wife reported that the pt "did real well" with lunch, but did cough once with nectar thick liquids via cup. Pt's wife reported that she is scared about aspiration due to one of her family dying following issues with aspiration. Pt tolerated puree solids without overt s/sx of aspiration, but inconsistently exhibited coughing with thin liquids via cup and with nectar thick liquids via cup and tsp. Oral residue was not observed and he did not exhibit symptoms of pharyngeal residue. Pt's mentation has improved, but he remains symptomatic of aspiration. A modified barium swallow study is recommended to assess physiology and guide intervention.    HPI HPI: Pt is a 79 y.o. male with medical history significant of frontotemporal dementia, recurrent UTIs, BPH noncompliant with medications, HTN, IIDM, chronic ambulatory dysfunction, depression anxiety, who presented with fever, hypoxia, and AMS. Per H&P, pt nonverbal at baseline and minimally responsive. Note from ED MD stated "He has history of prior stroke and is minimally verbal, however has been less vocal over the past 2 days." CT head: No acute intracranial abnormality. CXR was negative for acute changes. Pt found to have UTI, and acute on chronic encephalopathy.      SLP Plan  MBS       Recommendations  Diet recommendations: Dysphagia 1 (puree);Nectar-thick liquid Liquids provided via: Cup;Teaspoon Medication Administration: Crushed with puree Supervision: Full supervision/cueing for compensatory strategies Compensations: Slow rate;Small sips/bites;Minimize environmental  distractions Postural Changes and/or Swallow Maneuvers: Seated upright 90 degrees                Oral Care Recommendations: Oral care BID Follow up Recommendations:  (TBD) SLP Visit Diagnosis: Dysphagia, unspecified (R13.10) Plan: MBS       Sanai Frick I. Vear Clock, MS, CCC-SLP Acute Rehabilitation Services Office number [phone] Pager [phone]                Scheryl Marten 08/30/2020, 3:19 PM

## 2020-08-30 NOTE — Progress Notes (Signed)
SLP Cancellation Note  Patient Details Name: Daniel Morrow MRN: [id number] DOB: December 08, 1940   Cancelled treatment:       Reason Eval/Treat Not Completed: Patient at procedure or test/unavailable (Pt currently ambulating with PT. SLP will f/u)  Yvone Neu I. Vear Clock, MS, CCC-SLP Acute Rehabilitation Services Office number [phone] Pager [phone]  Scheryl Marten 08/30/2020, 2:25 PM

## 2020-08-30 NOTE — Progress Notes (Addendum)
PROGRESS NOTE  Daniel Morrow WUX:[id number] DOB: 1941/08/15 DOA: 08/22/2020 PCP: Kirby Funk, MD  Brief History   78 year old man PMH frontotemporal dementia, as well as being wheelchair and bedbound for the last 4 weeks prior to presentation, presented with fever and hypoxia.  Admitted for acute on chronic metabolic encephalopathy thought secondary to aspiration pneumonia.  Plan for SNF.   A & P  Frontotemporal dementia with associated dysphagia --Stable.  Continue memantine, follow-up with neurology as an outpatient --Continue dysphagia 1 diet with nectar thick liquids, medications crushed with pure  Diabetes mellitus type 2 --Somewhat hyperglycemic.  Start Lantus 3 units nightly.  Acute metabolic encephalopathy present on admission --Acute issue resolved, appears to be at baseline. --Secondary to aspiration pneumonia, possibly UTI complicated by frontotemporal dementia --Completed antibiotics  Acute hypoxic respiratory failure secondary to aspiration pneumonia --Resolved, completed antibiotics --Continue diet as per speech therapy  Recurrent UTI, BPH --Completed antibiotics  Profound ambulatory dysfunction, fall in hospital 12/5 with supraorbital right eye laceration requiring 5 sutures.  CT head negative. --Remove sutures approximately 12/10-12  Disposition Plan:  Discussion: as above  Status is: Inpatient  Remains inpatient appropriate because:Unsafe d/c plan   Dispo:  Patient From: Home  Planned Disposition: Skilled Nursing Facility  Expected discharge date: 09/03/2020  Medically stable for discharge: Yes  DVT prophylaxis: enoxaparin (LOVENOX) injection 40 mg Start: 08/22/20 1615   Code Status: DNR Family Communication: wife at bedside  Brendia Sacks, MD  [facility] Direct contact: see [email or web address] (further directions at bottom of note if needed) 7PM-7AM contact night coverage as at bottom of note 08/30/2020, 6:44 PM  LOS: 8 days   Significant  Hospital Events   .    Consults:  . PMT   Procedures:  .   Significant Diagnostic Tests:  Marland Kitchen    Micro Data:  .    Antimicrobials:  .   Interval History/Subjective  CC: f/u fall  Doing ok per wife at bedside Pt cannot offer history  Objective   Vitals:  Vitals:   08/30/20 1125 08/30/20 1624  BP: 119/62 137/79  Pulse: (!) 59 74  Resp: 18 16  Temp: 98.2 F (36.8 C) 98 F (36.7 C)  SpO2:  93%    Exam:  Constitutional:   . Appears calm and comfortable Respiratory:  . CTA bilaterally, no w/r/r.  . Respiratory effort normal.  Cardiovascular:  . RRR, no m/r/g Psychiatric:  . Mental status . Confused   I have personally reviewed the following:   Today's Data  . CBG remains stable, slightly high   Scheduled Meds: . amLODipine  10 mg Oral Daily  . brimonidine  1 drop Right Eye BID  . enoxaparin (LOVENOX) injection  40 mg Subcutaneous Q24H  . insulin aspart  0-15 Units Subcutaneous TID WC  . insulin aspart  0-5 Units Subcutaneous QHS  . insulin aspart  5 Units Subcutaneous TID WC  . insulin glargine  3 Units Subcutaneous QHS  . latanoprost  1 drop Right Eye QHS  . lisinopril  2.5 mg Oral BID  . QUEtiapine  25 mg Oral BID  . sertraline  150 mg Oral Daily  . tamsulosin  0.4 mg Oral QPC supper  . timolol  1 drop Right Eye BID   Continuous Infusions:  Principal Problem:   Aspiration pneumonia (HCC) Active Problems:   Type II diabetes mellitus (HCC)   Encephalopathy   Urinary tract infection without hematuria   Frontotemporal dementia (HCC)   At risk for aspiration  Goals of care, counseling/discussion   Palliative care by specialist   DNR (do not resuscitate)   Acute hypoxemic respiratory failure (HCC)   Acute metabolic encephalopathy   LOS: 8 days   How to contact the [facility] Attending or Consulting provider 7A - 7P or covering provider during after hours 7P -7A, for this patient?  1. Check the care team in [facility] and look for a)  attending/consulting TRH provider listed and b) the [facility] team listed 2. Log into [email or web address] and use [place]'s universal password to access. If you do not have the password, please contact the hospital operator. 3. Locate the [facility] provider you are looking for under [facility] and page to a number that you can be directly reached. 4. If you still have difficulty reaching the provider, please page the [facility] (Director on Call) for the Hospitalists listed on amion for assistance.

## 2020-08-30 NOTE — TOC Progression Note (Addendum)
Transition of Care [facility]) - Progression Note    Patient Details  Name: Daniel Morrow MRN: [id number] Date of Birth: 11/18/1940  [facility]) CM/SW Contact  Levada Schilling Phone Number: 08/30/2020, 12:51 PM  Clinical Narrative:    Pt's PASRR is under review. CSW faxed pt out  as request by pt's spouse to SNF's in [address], [place] and [place].  TOC Team will continue to assist for disposition planning.   Expected Discharge Plan: Skilled Nursing Facility Barriers to Discharge: Continued Medical Work up,Insurance Authorization,SNF Pending bed offer  Expected Discharge Plan and Services Expected Discharge Plan: Skilled Nursing Facility     Post Acute Care Choice: Skilled Nursing Facility Living arrangements for the past 2 months: Single Family Home                                       Social Determinants of Health (SDOH) Interventions    Readmission Risk Interventions No flowsheet data found.

## 2020-08-30 NOTE — Progress Notes (Signed)
Physical Therapy Treatment Patient Details Name: Daniel Morrow MRN: [id number] DOB: [birth year] Today's Date: 08/30/2020    History of Present Illness 79 y.o. male  with past medical history of frontotemporal dementia, recurrent UTI's, BPH noncompliant with medications, HTN, DM type 2, depression, anxiety admitted on 08/22/2020 with altered mental status. Pt experiencing multiple falls over the last few weeks. Pt found to have aspiration PNA, UTI, and acute on chronic encephalopathy.    PT Comments    Pt tolerates treatment well, ambulating for multiple trials. Pt continues to demonstrate left lateral lean and narrowed BOS during ambulation, needing PT physical assistance to prevent falls. Pt with poor direction and demonstrates difficulty extending his cervical spine to look at oncoming objects. Pt will continue to benefit from acute PT POC to improve mobility quality and to reduce falls risk. PT continues to recommend SNF placement at this time.  Follow Up Recommendations  SNF;Supervision/Assistance - 24 hour     Equipment Recommendations  Wheelchair (measurements PT)    Recommendations for Other Services       Precautions / Restrictions Precautions Precautions: Fall Precaution Comments: mostly nonverbal at baseline Restrictions Weight Bearing Restrictions: No    Mobility  Bed Mobility Overal bed mobility: Needs Assistance Bed Mobility: Supine to Sit;Sit to Supine     Supine to sit: Min assist Sit to supine: Mod assist      Transfers Overall transfer level: Needs assistance Equipment used: Rolling walker (2 wheeled) Transfers: Sit to/from Stand Sit to Stand: Min assist;Mod assist         General transfer comment: narrowed BOS, limited power into standing, posterior lean  Ambulation/Gait Ambulation/Gait assistance: Mod assist Gait Distance (Feet): 60 Feet (additional 40') Assistive device: Rolling walker (2 wheeled) Gait Pattern/deviations: Step-to  pattern;Trunk flexed;Narrow base of support Gait velocity: reduced Gait velocity interpretation: <1.31 ft/sec, indicative of household ambulator General Gait Details: pt with short step-to gait, very narrow BOS, tendency for left lateral lean requiring modA to correct. Pt requires assistance to manage RW during turns   Stairs             Wheelchair Mobility    Modified Rankin (Stroke Patients Only)       Balance Overall balance assessment: Needs assistance Sitting-balance support: No upper extremity supported;Feet supported Sitting balance-Leahy Scale: Fair     Standing balance support: Bilateral upper extremity supported Standing balance-Leahy Scale: Poor Standing balance comment: minA with BUE support of RW                            Cognition Arousal/Alertness: Awake/alert Behavior During Therapy: Flat affect (other than laugh and smile at end of session) Overall Cognitive Status: History of cognitive impairments - at baseline                                 General Comments: follows simple commands with increased time, seems to have limited awareness and deficits during session, requiring constant assistance and cueing      Exercises      General Comments General comments (skin integrity, edema, etc.): VSS on RA      Pertinent Vitals/Pain Pain Assessment: No/denies pain Faces Pain Scale: No hurt    Home Living Family/patient expects to be discharged to:: Skilled nursing facility Living Arrangements: Spouse/significant other  Prior Function            PT Goals (current goals can now be found in the care plan section) Acute Rehab PT Goals Patient Stated Goal: per wife to go to facility that is not 1 star if not ill have to take him home Progress towards PT goals: Progressing toward goals    Frequency    Min 2X/week      PT Plan Current plan remains appropriate    Co-evaluation               AM-PAC PT "6 Clicks" Mobility   Outcome Measure  Help needed turning from your back to your side while in a flat bed without using bedrails?: A Little Help needed moving from lying on your back to sitting on the side of a flat bed without using bedrails?: A Little Help needed moving to and from a bed to a chair (including a wheelchair)?: A Lot Help needed standing up from a chair using your arms (e.g., wheelchair or bedside chair)?: A Lot Help needed to walk in hospital room?: A Lot Help needed climbing 3-5 steps with a railing? : Total 6 Click Score: 13    End of Session   Activity Tolerance: Patient tolerated treatment well Patient left: in bed;with call bell/phone within reach;with bed alarm set;with family/visitor present Nurse Communication: Mobility status PT Visit Diagnosis: Unsteadiness on feet (R26.81)     Time: 7782-4235 PT Time Calculation (min) (ACUTE ONLY): 32 min  Charges:  $Gait Training: 8-22 mins $Therapeutic Activity: 8-22 mins                     Arlyss Gandy, PT, DPT Acute Rehabilitation Pager: [phone]    Arlyss Gandy 08/30/2020, 3:25 PM

## 2020-08-31 ENCOUNTER — Inpatient Hospital Stay (HOSPITAL_COMMUNITY): Payer: Medicare HMO

## 2020-08-31 LAB — GLUCOSE, CAPILLARY
Glucose-Capillary: 242 mg/dL — ABNORMAL HIGH (ref 70–99)
Glucose-Capillary: 190 mg/dL — ABNORMAL HIGH (ref 70–99)
Glucose-Capillary: 289 mg/dL — ABNORMAL HIGH (ref 70–99)
Glucose-Capillary: 128 mg/dL — ABNORMAL HIGH (ref 70–99)
Glucose-Capillary: 175 mg/dL — ABNORMAL HIGH (ref 70–99)

## 2020-08-31 MED ORDER — INSULIN GLARGINE 100 UNIT/ML ~~LOC~~ SOLN
10.0000 [IU] | Freq: Every day | SUBCUTANEOUS | Status: DC
  Administered 2020-08-31: 10 [IU] via SUBCUTANEOUS
  Filled 2020-08-31 (×3): qty 0.1

## 2020-08-31 MED ORDER — TRAZODONE HCL 100 MG PO TABS
100.0000 mg | ORAL_TABLET | Freq: Every day | ORAL | Status: DC
  Administered 2020-08-31 – 2020-09-05 (×6): 100 mg via ORAL
  Filled 2020-08-31 (×7): qty 1

## 2020-08-31 NOTE — TOC Progression Note (Signed)
Transition of Care [facility]) - Progression Note    Patient Details  Name: Daniel Morrow MRN: [id number] Date of Birth: 1941/07/19  [facility]) CM/SW Contact  [name], [place] Phone Number: 08/31/2020, 1:36 PM  Clinical Narrative:     CSW received email that this pt's PASSR had been halted. CSW followed up and was informed that the PASSR on file ([id number] H) was sufficient for the pt's needs and halted meant that it did not require further screening. CSW will move forward with placement given this information.  Expected Discharge Plan: Skilled Nursing Facility Barriers to Discharge: Continued Medical Work up,Insurance Authorization,SNF Pending bed offer  Expected Discharge Plan and Services Expected Discharge Plan: Skilled Nursing Facility     Post Acute Care Choice: Skilled Nursing Facility Living arrangements for the past 2 months: Single Family Home                                       Social Determinants of Health (SDOH) Interventions    Readmission Risk Interventions No flowsheet data found.

## 2020-08-31 NOTE — Progress Notes (Signed)
  Speech Language Pathology Treatment: Dysphagia  Patient Details Name: Daniel Morrow MRN: [id number] DOB: 1941/07/08 Today's Date: 08/31/2020 Time: 9373-4287 SLP Time Calculation (min) (ACUTE ONLY): 15 min  Assessment / Plan / Recommendation Clinical Impression  Pt was seen for dysphagia treatment and was cooperative during the session. Pt's wife was educated regarding swallowing anatomy and physiology, the results of the modified barium swallow study and recommendations. Video recording of the study was used to facilitate education and she verbalized understanding. She was further educated regarding the rationale for allowance of water and the need for the 5cc Provale (controlled flow) cup for thin water to reduce laryngeal invasion. Pt tolerated thin water via cup without overt s/sx of aspiration, but poor laryngeal sensation was noted during the modified barium swallow study. It is recommended that dysphagia 1 solids with honey thick liquids be continued. However, pt may have thin water via provale cup. SLP will continue to follow pt.    HPI HPI: Pt is a 79 y.o. male with medical history significant of frontotemporal dementia, recurrent UTIs, BPH noncompliant with medications, HTN, IIDM, chronic ambulatory dysfunction, depression anxiety, who presented with fever, hypoxia, and AMS. Per H&P, pt nonverbal at baseline and minimally responsive. Note from ED MD stated "He has history of prior stroke and is minimally verbal, however has been less vocal over the past 2 days." CT head: No acute intracranial abnormality. CXR was negative for acute changes. Pt found to have UTI, and acute on chronic encephalopathy.      SLP Plan  Continue with current plan of care       Recommendations  Diet recommendations: Dysphagia 1 (puree);Honey-thick liquid (Pt may have water between meals via Provale cup (controlled flow cup with blue lid in room) following oral care.) Liquids provided via:  Cup;Straw Medication Administration: Crushed with puree Supervision: Full supervision/cueing for compensatory strategies Compensations: Slow rate;Small sips/bites;Minimize environmental distractions Postural Changes and/or Swallow Maneuvers: Seated upright 90 degrees                Oral Care Recommendations: Oral care BID Follow up Recommendations: Skilled Nursing facility SLP Visit Diagnosis: Dysphagia, unspecified (R13.10) Plan: Continue with current plan of care       Zuzu Befort I. Vear Clock, MS, CCC-SLP Acute Rehabilitation Services Office number [phone] Pager [phone]                 Scheryl Marten 08/31/2020, 2:24 PM

## 2020-08-31 NOTE — Progress Notes (Signed)
PROGRESS NOTE  Daniel Morrow RWE:[id number] DOB: 12-Dec-1940 DOA: 08/22/2020 PCP: Kirby Funk, MD  Brief History   79 year old man PMH frontotemporal dementia, as well as being wheelchair and bedbound for the last 4 weeks prior to presentation, presented with fever and hypoxia.  Admitted for acute on chronic metabolic encephalopathy thought secondary to aspiration pneumonia.  Plan for SNF.   A & P  Frontotemporal dementia with associated dysphagia --Remains stable, continue memantine, follow-up with neurology as an outpatient. --Continue diet as per speech therapy  Diabetes mellitus type 2 --Remains hyperglycemic.  Increase Lantus.  Continue sliding scale insulin..  Acute metabolic encephalopathy present on admission --Acute issue resolved, appears to be at baseline. --Secondary to aspiration pneumonia, possibly UTI complicated by frontotemporal dementia --Completed antibiotics  Acute hypoxic respiratory failure secondary to aspiration pneumonia --Resolved, completed antibiotics --Continue diet as per speech therapy  Recurrent UTI, BPH --Completed antibiotics  Profound ambulatory dysfunction, fall in hospital 12/5 with supraorbital right eye laceration requiring 5 sutures.  CT head negative. --Remove sutures approximately 12/11  Disposition Plan:  Discussion: as above  Status is: Inpatient  Remains inpatient appropriate because:  Inpatient level of care appropriate due to severity of illness   Dispo:  Patient From: Home  Planned Disposition: Skilled Nursing Facility  Expected discharge date: 09/03/2020  Medically stable for discharge: Yes  DVT prophylaxis: enoxaparin (LOVENOX) injection 40 mg Start: 08/22/20 1615   Code Status: DNR Family Communication: wife at bedside again 12/11  Brendia Sacks, MD  [facility] Direct contact: see [email or web address] (further directions at bottom of note if needed) 7PM-7AM contact night coverage as at bottom of note 08/31/2020,  3:58 PM  LOS: 9 days   Significant Hospital Events   .    Consults:  . PMT   Procedures:  .   Significant Diagnostic Tests:  Marland Kitchen    Micro Data:  .    Antimicrobials:  .   Interval History/Subjective  CC: f/u fall  Has been restless per wife Patient nonverbal  Objective   Vitals:  Vitals:   08/31/20 0349 08/31/20 1319  BP: 120/64 135/70  Pulse: (!) 53 64  Resp: 18 18  Temp: (!) 97.2 F (36.2 C) 98 F (36.7 C)  SpO2: 94% 95%    Exam:  Constitutional:   . Appears calm and comfortable ENMT:  . grossly normal hearing  . Right brow laceration healing well, sutures in place Respiratory:  . CTA bilaterally, no w/r/r.  . Respiratory effort normal.  Cardiovascular:  . RRR, no m/r/g . No LE extremity edema   Psychiatric:  . Mental status . Mute at baseline, does not interact   I have personally reviewed the following:   Today's Data  . CBG remains high, will increase Lantus   Scheduled Meds: . amLODipine  10 mg Oral Daily  . brimonidine  1 drop Right Eye BID  . enoxaparin (LOVENOX) injection  40 mg Subcutaneous Q24H  . insulin aspart  0-15 Units Subcutaneous TID WC  . insulin aspart  0-5 Units Subcutaneous QHS  . insulin aspart  5 Units Subcutaneous TID WC  . insulin glargine  10 Units Subcutaneous QHS  . latanoprost  1 drop Right Eye QHS  . lisinopril  2.5 mg Oral BID  . QUEtiapine  25 mg Oral BID  . sertraline  150 mg Oral Daily  . tamsulosin  0.4 mg Oral QPC supper  . timolol  1 drop Right Eye BID   Continuous Infusions:  Principal Problem:  Aspiration pneumonia (HCC) Active Problems:   Type II diabetes mellitus (HCC)   Encephalopathy   Urinary tract infection without hematuria   Frontotemporal dementia (HCC)   At risk for aspiration   Goals of care, counseling/discussion   Palliative care by specialist   DNR (do not resuscitate)   Acute hypoxemic respiratory failure (HCC)   Acute metabolic encephalopathy   LOS: 9 days   How to  contact the [facility] Attending or Consulting provider 7A - 7P or covering provider during after hours 7P -7A, for this patient?  1. Check the care team in [facility] and look for a) attending/consulting TRH provider listed and b) the [facility] team listed 2. Log into [email or web address] and use [place]'s universal password to access. If you do not have the password, please contact the hospital operator. 3. Locate the [facility] provider you are looking for under [facility] and page to a number that you can be directly reached. 4. If you still have difficulty reaching the provider, please page the [facility] (Director on Call) for the Hospitalists listed on amion for assistance.

## 2020-08-31 NOTE — Progress Notes (Signed)
Occupational Therapy Treatment Patient Details Name: Daniel Morrow MRN: [id number] DOB: [birth year] Today's Date: 08/31/2020    History of present illness 79 y.o. male  with past medical history of frontotemporal dementia, recurrent UTI's, BPH noncompliant with medications, HTN, DM type 2, depression, anxiety admitted on 08/22/2020 with altered mental status. Pt experiencing multiple falls over the last few weeks. Pt found to have aspiration PNA, UTI, and acute on chronic encephalopathy.   OT comments  Pt noted to be attempting exit from bed with incontinence of bladder. Pt with LB hygiene and toilet transfer provided. Pt now back in bed with bed alarm set with wife at bedside. Pt does follow simple commands with increased time. Recommendation for SNF at this time.   Follow Up Recommendations  SNF    Equipment Recommendations  Wheelchair (measurements OT);Wheelchair cushion (measurements OT);Hospital bed;3 in 1 bedside commode    Recommendations for Other Services Other (comment)    Precautions / Restrictions Precautions Precautions: Fall Precaution Comments: mostly nonverbal at baseline       Mobility Bed Mobility Overal bed mobility: Needs Assistance Bed Mobility: Supine to Sit;Sit to Supine     Supine to sit: Min guard Sit to supine: Min assist   General bed mobility comments: Pt at eob on arrival and bed sounding. pt noted to have incontinence. wife at bedside. Pt transferred to bathroom and voiding again . pt with new mesh panty and pad placed. pt abel to static stand with min (A) and total (A) for LB dressing  Transfers Overall transfer level: Needs assistance Equipment used: Rolling walker (2 wheeled) Transfers: Sit to/from Stand Sit to Stand: Mod assist         General transfer comment: narrawed BOS and L lateral lean    Balance Overall balance assessment: Needs assistance Sitting-balance support: No upper extremity supported;Feet supported        Standing balance support: Bilateral upper extremity supported;During functional activity Standing balance-Leahy Scale: Poor                             ADL either performed or assessed with clinical judgement   ADL Overall ADL's : Needs assistance/impaired   Eating/Feeding Details (indicate cue type and reason): wife reports new cup recommended by SLP                 Lower Body Dressing: Total assistance Lower Body Dressing Details (indicate cue type and reason): noted to have incontinence and attempting to exit the bed. pt with new brief placed Toilet Transfer: Moderate assistance;RW;BSC           Functional mobility during ADLs: Moderate assistance;Rolling walker General ADL Comments: pt noted to have L side leaning with transfer and needs cues to reposition to progress patient     Vision   Additional Comments: Wife with glasses   Perception     Praxis      Cognition Arousal/Alertness: Awake/alert Behavior During Therapy: Flat affect Overall Cognitive Status: History of cognitive impairments - at baseline                                 General Comments: pt abel to follow simple commands with increase time, pt unabel to make selection with two options, pt was unable to select yes vs NO written with glasses on.        Exercises  Shoulder Instructions       General Comments benefits from repositioning to decrease attempts at OOB without assistance    Pertinent Vitals/ Pain       Pain Assessment: No/denies pain Faces Pain Scale: No hurt  Home Living                                          Prior Functioning/Environment              Frequency  Min 2X/week        Progress Toward Goals  OT Goals(current goals can now be found in the care plan section)  Progress towards OT goals: Progressing toward goals  Acute Rehab OT Goals Patient Stated Goal: per wife to get to facility OT Goal  Formulation: With patient Time For Goal Achievement: 09/09/20 Potential to Achieve Goals: Fair ADL Goals Pt Will Perform Grooming: with min assist;sitting Pt Will Perform Lower Body Bathing: with mod assist;sit to/from stand Pt Will Perform Upper Body Dressing: with min assist;sitting Pt Will Perform Lower Body Dressing: with mod assist;sit to/from stand Pt Will Transfer to Toilet: with min assist;stand pivot transfer;bedside commode;ambulating Pt Will Perform Toileting - Clothing Manipulation and hygiene: with mod assist;sit to/from stand  Plan Discharge plan remains appropriate    Co-evaluation                 AM-PAC OT "6 Clicks" Daily Activity     Outcome Measure   Help from another person eating meals?: A Lot Help from another person taking care of personal grooming?: A Lot Help from another person toileting, which includes using toliet, bedpan, or urinal?: Total Help from another person bathing (including washing, rinsing, drying)?: A Lot Help from another person to put on and taking off regular upper body clothing?: A Lot Help from another person to put on and taking off regular lower body clothing?: A Lot 6 Click Score: 11    End of Session Equipment Utilized During Treatment: Gait belt;Rolling walker  OT Visit Diagnosis: Other symptoms and signs involving cognitive function;Other abnormalities of gait and mobility (R26.89);Muscle weakness (generalized) (M62.81)   Activity Tolerance Patient tolerated treatment well   Patient Left in bed;with call bell/phone within reach;with bed alarm set;with family/visitor present   Nurse Communication Mobility status;Precautions        Time: 8338-2505 OT Time Calculation (min): 21 min  Charges: OT General Charges $OT Visit: 1 Visit OT Treatments $Self Care/Home Management : 8-22 mins   Brynn, OTR/L  Acute Rehabilitation Services Pager: [phone] Office: [phone] .    Mateo Flow 08/31/2020, 2:07  PM

## 2020-08-31 NOTE — Progress Notes (Signed)
Modified Barium Swallow Progress Note  Patient Details  Name: Daniel Morrow MRN: [id number] Date of Birth: Feb 02, 1941  Today's Date: 08/31/2020  Modified Barium Swallow completed.  Full report located under Chart Review in the Imaging Section.  Brief recommendations include the following:  Clinical Impression  Pt presents with oropharyngeal dysphagia characterized by impaired mastication, reduced bolus cohesion, and a pharyngeal delay. Mastication was moderately prolonged with dysphagia 3 solids. He exhibited premature spillage to the valleculae and pyriform sinuses. Penetration (PAS 3) and aspiration (PAS 8) were noted with nectar thick liquids and thin liquids secondary to the pharyngeal delay. Most instances of aspiration were silent but delayed throat clearing was occasionally noted following repeated episodes of aspiration. Immediate aspiration was reduced/eliminated with reduced bolus sizes, but penetration persisted and aspiration was ultimately noted. Postural modifications were attempted, but pt was unable to follow the necessary commands for completion of them. A dysphagia 1 diet with honey thick liquids is recommended at this time. SLP will follow for dysphagia treatment.   Swallow Evaluation Recommendations       SLP Diet Recommendations: Dysphagia 1 (Puree) solids;Honey thick liquids   Liquid Administration via: Cup;Straw   Medication Administration: Crushed with puree   Supervision: Staff to assist with self feeding;Full supervision/cueing for compensatory strategies   Compensations: Slow rate;Small sips/bites;Minimize environmental distractions   Postural Changes: Remain semi-upright after after feeds/meals (Comment);Seated upright at 90 degrees   Oral Care Recommendations: Oral care BID      Kairee Kozma I. Vear Clock, MS, CCC-SLP Acute Rehabilitation Services Office number [phone] Pager [phone]   Scheryl Marten 08/31/2020,10:54 AM

## 2020-09-01 LAB — GLUCOSE, CAPILLARY
Glucose-Capillary: 160 mg/dL — ABNORMAL HIGH (ref 70–99)
Glucose-Capillary: 221 mg/dL — ABNORMAL HIGH (ref 70–99)
Glucose-Capillary: 273 mg/dL — ABNORMAL HIGH (ref 70–99)
Glucose-Capillary: 152 mg/dL — ABNORMAL HIGH (ref 70–99)

## 2020-09-01 MED ORDER — INSULIN GLARGINE 100 UNIT/ML ~~LOC~~ SOLN
15.0000 [IU] | Freq: Every day | SUBCUTANEOUS | Status: DC
  Administered 2020-09-01 – 2020-09-05 (×5): 15 [IU] via SUBCUTANEOUS
  Filled 2020-09-01 (×8): qty 0.15

## 2020-09-01 NOTE — TOC Progression Note (Signed)
Transition of Care [facility]) - Progression Note    Patient Details  Name: Daniel Morrow MRN: [id number] Date of Birth: 1940/09/25  Transition of Care [facility]) CM/SW Contact  Coralee Pesa, [place] Phone Number: 09/01/2020, 1:33 PM  Clinical Narrative:     CSW met with pt and wife, [name], at bedside. Bed offer for [facility] discussed, [name] did not want it due to 1 star rating. CSW called several facilities who accept [employer] and am waiting to hear back for additional bed offers. She also asked about [facility] who could not accept insurance and [facility], referral was faxed. SW will continue to follow.  Expected Discharge Plan: [place] Barriers to Discharge: Continued Medical Work up,Insurance Authorization,SNF Pending bed offer  Expected Discharge Plan and Services Expected Discharge Plan: [place] Choice: [place] arrangements for the past 2 months: Single Family Home                                       Social Determinants of Health (SDOH) Interventions    Readmission Risk Interventions No flowsheet data found.

## 2020-09-01 NOTE — Progress Notes (Signed)
PROGRESS NOTE  ESMOND HINCH HKV:[id number] DOB: 06/18/1941 DOA: 08/22/2020 PCP: Kirby Funk, MD  Brief History   79 year old man PMH frontotemporal dementia, as well as being wheelchair and bedbound for the last 4 weeks prior to presentation, presented with fever and hypoxia.  Admitted for acute on chronic metabolic encephalopathy thought secondary to aspiration pneumonia.  Plan for SNF.  A & P  Frontotemporal dementia with associated dysphagia --Periods of agitation but overall stable.  Continue amantadine, follow-up with neurology as an outpatient.   --Continue diet as per speech therapy.  Diabetes mellitus type 2 --Remains hyperglycemic, increase Lantus.  Continue sliding scale insulin and meal coverage.    Acute metabolic encephalopathy present on admission --Resolved.  Secondary to aspiration pneumonia, UTI on admission, complicated by dementia. --Completed antibiotics  Acute hypoxic respiratory failure secondary to aspiration pneumonia --Resolved, completed antibiotics --Continue diet as per speech therapy  Recurrent UTI, BPH --Completed antibiotics  Profound ambulatory dysfunction, fall in hospital 12/5 with supraorbital right eye laceration requiring 5 sutures.  CT head negative. --Remove sutures 12/12  Disposition Plan:  Discussion: as above  Status is: Inpatient  Remains inpatient appropriate because: Waiting for SNF bed  Dispo:  Patient From: Home  Planned Disposition: Skilled Nursing Facility  Expected discharge date: 09/03/2020  Medically stable for discharge: Yes  DVT prophylaxis: enoxaparin (LOVENOX) injection 40 mg Start: 08/22/20 1615   Code Status: DNR Family Communication: wife 12/11  Brendia Sacks, MD  [facility] Direct contact: see [email or web address] (further directions at bottom of note if needed) 7PM-7AM contact night coverage as at bottom of note 09/01/2020, 1:53 PM  LOS: 10 days   Significant Hospital Events       Consults:    PMT   Procedures:     Significant Diagnostic Tests:      Micro Data:      Antimicrobials:     Interval History/Subjective  CC: f/u fall  Sitter at bedside.  Patient ate well this morning.  Was restless earlier but after being cleaned up has been resting comfortably.  Patient nonverbal.  Objective   Vitals:  Vitals:   09/01/20 0846 09/01/20 1248  BP: (!) 104/56 138/87  Pulse: 63 63  Resp: 16 18  Temp: 97.9 F (36.6 C) 98 F (36.7 C)  SpO2: 97% 98%    Exam:  Constitutional:    Appears calm and comfortable, sleeping Respiratory:   CTA bilaterally, no w/r/r.   Respiratory effort normal. Cardiovascular:   RRR, no m/r/g  No LE extremity edema   Skin:   Healing laceration right brow Psychiatric:   Mental status  Mood, affect not assessable   I have personally reviewed the following:   Today's Data   Fasting blood sugar 160, hyperglycemic at lunch   Scheduled Meds:  amLODipine  10 mg Oral Daily   brimonidine  1 drop Right Eye BID   enoxaparin (LOVENOX) injection  40 mg Subcutaneous Q24H   insulin aspart  0-15 Units Subcutaneous TID WC   insulin aspart  0-5 Units Subcutaneous QHS   insulin aspart  5 Units Subcutaneous TID WC   insulin glargine  10 Units Subcutaneous QHS   latanoprost  1 drop Right Eye QHS   lisinopril  2.5 mg Oral BID   QUEtiapine  25 mg Oral BID   sertraline  150 mg Oral Daily   tamsulosin  0.4 mg Oral QPC supper   timolol  1 drop Right Eye BID   traZODone  100 mg Oral QHS  Continuous Infusions:  Principal Problem:   Aspiration pneumonia (HCC) Active Problems:   Type II diabetes mellitus (HCC)   Encephalopathy   Urinary tract infection without hematuria   Frontotemporal dementia (HCC)   At risk for aspiration   Goals of care, counseling/discussion   Palliative care by specialist   DNR (do not resuscitate)   Acute hypoxemic respiratory failure (HCC)   Acute metabolic encephalopathy   LOS:  10 days   How to contact the [facility] Attending or Consulting provider 7A - 7P or covering provider during after hours 7P -7A, for this patient?  1. Check the care team in [facility] and look for a) attending/consulting TRH provider listed and b) the [facility] team listed 2. Log into [email or web address] and use [place]'s universal password to access. If you do not have the password, please contact the hospital operator. 3. Locate the [facility] provider you are looking for under [facility] and page to a number that you can be directly reached. 4. If you still have difficulty reaching the provider, please page the [facility] (Director on Call) for the Hospitalists listed on amion for assistance.

## 2020-09-02 LAB — GLUCOSE, CAPILLARY
Glucose-Capillary: 136 mg/dL — ABNORMAL HIGH (ref 70–99)
Glucose-Capillary: 180 mg/dL — ABNORMAL HIGH (ref 70–99)
Glucose-Capillary: 128 mg/dL — ABNORMAL HIGH (ref 70–99)

## 2020-09-02 NOTE — TOC Progression Note (Signed)
Transition of Care [facility]) - Progression Note    Patient Details  Name: Daniel Morrow MRN: [id number] Date of Birth: 09/19/1941  Transition of Care [facility]) CM/SW Contact  [name], [place] Phone Number: 09/02/2020, 10:32 AM  Clinical Narrative:    [facility] has offered, and pt's wife is going to tour. Facility updated as they need to start [place] for insurance. SW will continue to follow for DC planning.   Expected Discharge Plan: Skilled Nursing Facility Barriers to Discharge: Continued Medical Work up,Insurance Authorization,SNF Pending bed offer  Expected Discharge Plan and Services Expected Discharge Plan: Skilled Nursing Facility     Post Acute Care Choice: Skilled Nursing Facility Living arrangements for the past 2 months: Single Family Home                                       Social Determinants of Health (SDOH) Interventions    Readmission Risk Interventions No flowsheet data found.

## 2020-09-02 NOTE — TOC Progression Note (Signed)
Transition of Care [facility]) - Progression Note    Patient Details  Name: RAMIRO PANGILINAN MRN: [id number] Date of Birth: 09-14-1941  Transition of [facility]) CM/SW Contact  Patrice [place], [place] Phone Number: [phone] 09/02/2020, 2:11 PM  Clinical Narrative:     CSW was called by patient's daughter and she stated that she had visited [facility] and does not want patient to be discharged to that facility. She inquired if a referral had been sent to [facility]. CSW sent referral to [place]. She also inquired about private pay and asked CSW to follow up with [facility] and [facility]. CSW reached out to both facilities and was unable to reach anyone.  TOC team will continue to assist with discharge planning needs.  Expected Discharge Plan: Skilled Nursing Facility Barriers to Discharge: Continued Medical Work up,Insurance Authorization,SNF Pending bed offer  Expected Discharge Plan and Services Expected Discharge Plan: Skilled Nursing Facility     Post Acute Care Choice: Skilled Nursing Facility Living arrangements for the past 2 months: Single Family Home                                       Social Determinants of Health (SDOH) Interventions    Readmission Risk Interventions No flowsheet data found.

## 2020-09-02 NOTE — Progress Notes (Signed)
5 sutures removed from above right eye and into the eye brow

## 2020-09-02 NOTE — Progress Notes (Signed)
PROGRESS NOTE  Daniel Morrow:[id number] DOB: Jul 16, 1941 DOA: 08/22/2020 PCP: Kirby Funk, MD  Brief History   79 year old man PMH frontotemporal dementia, as well as being wheelchair and bedbound for the last 4 weeks prior to presentation, presented with fever and hypoxia.  Admitted for acute on chronic metabolic encephalopathy thought secondary to aspiration pneumonia.  Plan for SNF.  A & P  Frontotemporal dementia with associated dysphagia --Continue quetiapine, follow-up with neurology as an outpatient.   --Continue diet as per speech therapy.  Diabetes mellitus type 2 --Blood sugars now appear to be well controlled.  Continue Lantus at current dose, continue sliding scale insulin and meal coverage  Profound ambulatory dysfunction, fall in hospital 12/5 with supraorbital right eye laceration requiring 5 sutures.  CT head negative. --Remove sutures 12/12  RESOLVED . Acute metabolic encephalopathy present on admission. Secondary to aspiration pneumonia, UTI on admission, complicated by dementia. . Acute hypoxic respiratory failure secondary to aspiration pneumonia . Recurrent UTI  Disposition Plan:  Discussion: as above  Status is: Inpatient  Remains inpatient appropriate because: waiting for SNF bed  Dispo:  Patient From: Home  Planned Disposition: Skilled Nursing Facility  Expected discharge date: 09/03/2020  Medically stable for discharge: Yes  DVT prophylaxis: enoxaparin (LOVENOX) injection 40 mg Start: 08/22/20 1615   Code Status: DNR Family Communication:    Brendia Sacks, MD  [facility] Direct contact: see [email or web address] (further directions at bottom of note if needed) 7PM-7AM contact night coverage as at bottom of note 09/02/2020, 5:16 PM  LOS: 11 days   Significant Hospital Events   .    Consults:  . PMT   Procedures:  .   Significant Diagnostic Tests:  Marland Kitchen    Micro Data:  .    Antimicrobials:  .   Interval History/Subjective  CC:  f/u fall  No new issues charted Pt not able to offer history  Objective   Vitals:  Vitals:   09/02/20 1157 09/02/20 1614  BP: 132/64 120/69  Pulse: 64 65  Resp: 18 16  Temp: 97.8 F (36.6 C) (!) 97.5 F (36.4 C)  SpO2: 95% 94%    Exam:  Constitutional:   . Appears calm and comfortable Respiratory:  . CTA bilaterally, no w/r/r.  . Respiratory effort normal.  Cardiovascular:  . RRR, no m/r/g . No LE extremity edema    I have personally reviewed the following:   Today's Data  . Fasting blood sugar 136 . CBG better controlled today, under 200   Scheduled Meds: . amLODipine  10 mg Oral Daily  . brimonidine  1 drop Right Eye BID  . enoxaparin (LOVENOX) injection  40 mg Subcutaneous Q24H  . insulin aspart  0-15 Units Subcutaneous TID WC  . insulin aspart  0-5 Units Subcutaneous QHS  . insulin aspart  5 Units Subcutaneous TID WC  . insulin glargine  15 Units Subcutaneous QHS  . latanoprost  1 drop Right Eye QHS  . lisinopril  2.5 mg Oral BID  . QUEtiapine  25 mg Oral BID  . sertraline  150 mg Oral Daily  . tamsulosin  0.4 mg Oral QPC supper  . timolol  1 drop Right Eye BID  . traZODone  100 mg Oral QHS   Continuous Infusions:  Principal Problem:   Aspiration pneumonia (HCC) Active Problems:   Type II diabetes mellitus (HCC)   Encephalopathy   Urinary tract infection without hematuria   Frontotemporal dementia (HCC)   At risk for aspiration  Goals of care, counseling/discussion   Palliative care by specialist   DNR (do not resuscitate)   Acute hypoxemic respiratory failure (HCC)   Acute metabolic encephalopathy   LOS: 11 days   How to contact the [facility] Attending or Consulting provider 7A - 7P or covering provider during after hours 7P -7A, for this patient?  1. Check the care team in [facility] and look for a) attending/consulting TRH provider listed and b) the [facility] team listed 2. Log into [email or web address] and use [place]'s universal password to access. If you  do not have the password, please contact the hospital operator. 3. Locate the [facility] provider you are looking for under [facility] and page to a number that you can be directly reached. 4. If you still have difficulty reaching the provider, please page the [facility] (Director on Call) for the Hospitalists listed on amion for assistance.

## 2020-09-03 DIAGNOSIS — J9601 Acute respiratory failure with hypoxia: Secondary | ICD-10-CM

## 2020-09-03 LAB — GLUCOSE, CAPILLARY
Glucose-Capillary: 227 mg/dL — ABNORMAL HIGH (ref 70–99)
Glucose-Capillary: 127 mg/dL — ABNORMAL HIGH (ref 70–99)
Glucose-Capillary: 163 mg/dL — ABNORMAL HIGH (ref 70–99)
Glucose-Capillary: 142 mg/dL — ABNORMAL HIGH (ref 70–99)
Glucose-Capillary: 265 mg/dL — ABNORMAL HIGH (ref 70–99)

## 2020-09-03 LAB — RESP PANEL BY RT-PCR (FLU A&B, COVID) ARPGX2
Influenza A by PCR: NEGATIVE
Influenza B by PCR: NEGATIVE
SARS Coronavirus 2 by RT PCR: NEGATIVE

## 2020-09-03 MED ORDER — INSULIN ASPART 100 UNIT/ML ~~LOC~~ SOLN: 5.0000 [IU] | Freq: Three times a day (TID) | SUBCUTANEOUS | Status: DC

## 2020-09-03 MED ORDER — RESOURCE THICKENUP CLEAR PO POWD: ORAL | Status: AC

## 2020-09-03 MED ORDER — SERTRALINE HCL 100 MG PO TABS: 150.0000 mg | ORAL_TABLET | Freq: Every day | ORAL | Status: DC

## 2020-09-03 MED ORDER — INSULIN GLARGINE 100 UNIT/ML ~~LOC~~ SOLN: 15.0000 [IU] | Freq: Every day | SUBCUTANEOUS | Status: DC

## 2020-09-03 MED ORDER — QUETIAPINE FUMARATE 25 MG PO TABS: 25.0000 mg | ORAL_TABLET | Freq: Two times a day (BID) | ORAL | Status: DC

## 2020-09-03 MED ORDER — LORAZEPAM 1 MG PO TABS
1.0000 mg | ORAL_TABLET | Freq: Once | ORAL | Status: AC | PRN
  Administered 2020-09-03: 1 mg via ORAL
  Filled 2020-09-03: qty 1

## 2020-09-03 MED ORDER — TAMSULOSIN HCL 0.4 MG PO CAPS: 0.4000 mg | ORAL_CAPSULE | Freq: Every day | ORAL | Status: DC

## 2020-09-03 NOTE — Discharge Summary (Addendum)
Physician Discharge Summary  EKIN PILAR WCB:[id number] DOB: [birth year] DOA: 08/22/2020  PCP: Kirby Funk, MD  Admit date: 08/22/2020 Discharge date: 09/04/2020  Recommendations for Outpatient Follow-up:  1. Dementia, outpatient palliative to follow 2. Started in insulin for DM type 2   Follow-up Information    Kirby Funk, MD. Schedule an appointment as soon as possible for a visit in 2 week(s).   Specialty: Internal Medicine Contact information: [address] [place] [address] [phone]        Tat, Octaviano Batty, DO. Schedule an appointment as soon as possible for a visit in 4 week(s).   Specialty: Neurology Contact information: [address] [place]  [address] [place] [address] [phone]                Discharge Diagnoses: Principal diagnosis is #1 Principal Problem:   Aspiration pneumonia (HCC) Active Problems:   Type II diabetes mellitus (HCC)   Encephalopathy   Urinary tract infection without hematuria   Frontotemporal dementia (HCC)   At risk for aspiration   Goals of care, counseling/discussion   Palliative care by specialist   DNR (do not resuscitate)   Acute hypoxemic respiratory failure (HCC)   Acute metabolic encephalopathy   Discharge Condition: improved Disposition: SNF  Diet recommendation:  Diet Orders (From admission, onward)    Start     Ordered   09/03/20 0000  Diet - low sodium heart healthy        09/03/20 1513   08/31/20 1058  DIET - DYS 1 Room service appropriate? Yes with Assist; Fluid consistency: Honey Thick  Diet effective now       Question Answer Comment  Room service appropriate? Yes with Assist   Fluid consistency: Honey Thick      08/31/20 1058         Diet recommendations: Dysphagia 1 (puree);Honey-thick liquid (Pt may have water between meals via Provale cup (controlled flow cup with blue lid in room) following oral care.) Liquids provided via: Cup;Straw Medication Administration:  Crushed with puree Supervision: Full supervision/cueing for compensatory strategies Compensations: Slow rate;Small sips/bites;Minimize environmental distractions Postural Changes and/or Swallow Maneuvers: Seated upright 90 degrees  Filed Weights   08/23/20 0900  Weight: 74.5 kg    HPI/Hospital Course:   79 year old man PMH frontotemporal dementia, as well as being wheelchair and bedbound for the last 4 weeks prior to presentation, presented with fever and hypoxia.  Admitted for acute on chronic metabolic encephalopathy thought secondary to aspiration pneumonia.  Treated with antibiotics, condition stabilized, evaluated by therapy with recommendation for SNF, patient unable to be cared for at home by wife.  A & P  Frontotemporal dementia with associated dysphagia --Remained stable.  Continue quetiapine, follow-up with neurology as an outpatient.   --Continue diet as per speech therapy.  Diabetes mellitus type 2 --Blood sugar stable.  Continue Lantus, meal coverage  Profound ambulatory dysfunction, fall in hospital 12/5 with supraorbital right eye laceration requiring 5 sutures.  CT head negative. --Sutures removed 12/12, wound healing well  RESOLVED . Acute metabolic encephalopathy present on admission. Secondary to aspiration pneumonia, UTI on admission, complicated by dementia. . Acute hypoxic respiratory failure secondary to aspiration pneumonia . Recurrent UTI   Consults:  . PMT  Today's assessment: S: CC: f/u dementia Wife at bedside Pt nonverbal Pt tries to get up at times  O: Vitals:  Vitals:   09/03/20 2017 09/04/20 0400  BP: 131/74 107/60  Pulse: 80 69  Resp: 17  16  Temp: 98.1 F (36.7 C) (!) 97.5 F (36.4 C)  SpO2: 96%     Constitutional:  . Appears calm and comfortable Respiratory:  . CTA bilaterally, no w/r/r.  . Respiratory effort normal.  Cardiovascular:  . RRR, no m/r/g . No LE extremity edema   Skin:  . Healing right brow laceration, sutures  removed Psychiatric:  o Mental status unchanged  CBG stable, fasting 127  Discharge Instructions  Discharge Instructions    Ambulatory referral to Neurology   Complete by: As directed    An appointment is requested in approximately: 4 weeks   Diet - low sodium heart healthy   Complete by: As directed    No wound care   Complete by: As directed      Allergies as of 09/04/2020   No Known Allergies     Medication List    STOP taking these medications   glimepiride 4 MG tablet Commonly known as: AMARYL     TAKE these medications   acetaminophen 325 MG tablet Commonly known as: TYLENOL Take 650 mg by mouth every 6 (six) hours as needed for mild pain, fever or headache.   amLODipine 10 MG tablet Commonly known as: NORVASC Take 1 tablet (10 mg total) by mouth daily.   benazepril 20 MG tablet Commonly known as: LOTENSIN Take 20 mg by mouth daily.   brimonidine 0.2 % ophthalmic solution Commonly known as: ALPHAGAN Place 1 drop into the right eye 2 (two) times daily.   colesevelam 625 MG tablet Commonly known as: WELCHOL Take 1,875 mg by mouth 2 (two) times daily with a meal.   dipyridamole-aspirin 200-25 MG 12hr capsule Commonly known as: AGGRENOX Take 1 capsule by mouth 2 (two) times daily.   insulin aspart 100 UNIT/ML injection Commonly known as: novoLOG Inject 5 Units into the skin 3 (three) times daily with meals.   insulin glargine 100 UNIT/ML injection Commonly known as: LANTUS Inject 0.15 mLs (15 Units total) into the skin at bedtime.   latanoprost 0.005 % ophthalmic solution Commonly known as: XALATAN Place 1 drop into the right eye at bedtime.   memantine 10 MG tablet Commonly known as: NAMENDA Take 1 tablet (10 mg total) by mouth 2 (two) times daily.   Ozempic (1 MG/DOSE) 4 MG/3ML Sopn Generic drug: Semaglutide (1 MG/DOSE) Inject 1 mg into the skin once a week. Thursday   pantoprazole 20 MG tablet Commonly known as: PROTONIX Take 20 mg by  mouth 2 (two) times daily.   QUEtiapine 50 MG tablet Commonly known as: SEROQUEL Take 1 tablet (50 mg total) by mouth at bedtime.   QUEtiapine 25 MG tablet Commonly known as: SEROQUEL Take 1 tablet (25 mg total) by mouth in the morning. Start taking on: September 05, 2020   Resource ThickenUp Clear Powd Use as needed   sertraline 100 MG tablet Commonly known as: ZOLOFT Take 1.5 tablets (150 mg total) by mouth daily.   tamsulosin 0.4 MG Caps capsule Commonly known as: FLOMAX Take 1 capsule (0.4 mg total) by mouth daily after supper.   timolol 0.5 % ophthalmic solution Commonly known as: TIMOPTIC Place 1 drop into the right eye 2 (two) times daily.   traZODone 100 MG tablet Commonly known as: DESYREL Take 100 mg by mouth at bedtime.   vitamin B-12 1000 MCG tablet Commonly known as: CYANOCOBALAMIN Take 1,000 mcg by mouth daily.      No Known Allergies  The results of significant diagnostics from this hospitalization (including  imaging, microbiology, ancillary and laboratory) are listed below for reference.    Significant Diagnostic Studies: DG Shoulder 1V Right  Result Date: 08/26/2020 CLINICAL DATA:  Pain EXAM: RIGHT SHOULDER - 1 VIEW; RIGHT HUMERUS - 2+ VIEW COMPARISON:  None. FINDINGS: There is no evidence of fracture or dislocation. There is no evidence of arthropathy or other focal bone abnormality. Soft tissues are unremarkable. IMPRESSION: No acute displaced fracture or dislocation. Evaluation is limited by single view technique of the right shoulder. Electronically Signed   By: Katherine Mantle M.D.   On: 08/26/2020 21:52   CT HEAD WO CONTRAST  Result Date: 08/26/2020 CLINICAL DATA:  Facial trauma. Additional history provided: Fall on unit, laceration to forehead. EXAM: CT HEAD WITHOUT CONTRAST TECHNIQUE: Contiguous axial images were obtained from the base of the skull through the vertex without intravenous contrast. COMPARISON:  Prior head CT examinations  08/22/2020 and earlier. FINDINGS: Brain: Mild cerebral and cerebellar atrophy. Ill-defined hypoattenuation within the cerebral white matter is nonspecific, but compatible chronic small vessel ischemic disease. Redemonstrated small chronic cerebellar infarcts. There is no acute intracranial hemorrhage. No demarcated cortical infarct. No extra-axial fluid collection. No midline shift. Vascular: No hyperdense vessel.  Atherosclerotic calcifications. Skull: No calvarial fracture. Redemonstrated chronic fractures of the left zygomatic arch. Sinuses/Orbits: Visualized orbits show no acute finding. Chronic left globe findings suggestive of vitreous detachment (series 3, image 10). Mild scattered paranasal sinus mucosal thickening. Small bilateral maxillary sinus mucous retention cysts. Other: Right forehead soft tissue swelling. IMPRESSION: No evidence of acute intracranial abnormality. Right forehead soft tissue swelling. Stable parenchymal atrophy and chronic small vessel ischemic disease. Redemonstrated small chronic cerebellar infarcts. Electronically Signed   By: Jackey Loge DO   On: 08/26/2020 18:21   CT Head Wo Contrast  Result Date: 08/22/2020 CLINICAL DATA:  Mental status change EXAM: CT HEAD WITHOUT CONTRAST TECHNIQUE: Contiguous axial images were obtained from the base of the skull through the vertex without intravenous contrast. COMPARISON:  08/07/2020 FINDINGS: Motion artifact is present on several slices. Brain: There is no acute intracranial hemorrhage, mass effect, or edema. Gray-white differentiation is preserved within the above limitation. There is no extra-axial fluid collection. Patchy hypoattenuation in the supratentorial white matter is nonspecific but likely reflects stable chronic microvascular ischemic changes. Small chronic right cerebellar infarct. Ventricles and sulci are stable in size and configuration. Vascular: There is atherosclerotic calcification at the skull base. Skull: Calvarium  is unremarkable. Sinuses/Orbits: No acute finding.  Stable appearance of the orbits. Other: None. IMPRESSION: Motion degraded study. No acute intracranial abnormality or significant change since recent prior study. Electronically Signed   By: Guadlupe Spanish M.D.   On: 08/22/2020 14:00   CT Head Wo Contrast  Result Date: 08/07/2020 CLINICAL DATA:  Delirium EXAM: CT HEAD WITHOUT CONTRAST TECHNIQUE: Contiguous axial images were obtained from the base of the skull through the vertex without intravenous contrast. COMPARISON:  July 2020 FINDINGS: Brain: There is no acute intracranial hemorrhage, mass effect, or edema. Gray-white differentiation is preserved. There is no extra-axial fluid collection. Prominence of the ventricles and sulci reflects stable generalized parenchymal volume loss. Patchy and confluent areas of hypoattenuation in the supratentorial white matter are nonspecific but may reflect moderate chronic microvascular ischemic changes. Small chronic right cerebellar infarct. Vascular: There is atherosclerotic calcification at the skull base. Skull: Calvarium is unremarkable. Sinuses/Orbits: No acute finding. Other: None. IMPRESSION: No acute intracranial abnormality. Stable chronic findings detailed above. Electronically Signed   By: Guadlupe Spanish M.D.   On:  08/07/2020 15:03   US RENAL  Result Date: 08/22/2020 CLINICAL DATA:  Urinary tract infection EXAM: RENAL / URINARY TRACT ULTRASOUND COMPLETE COMPARISON:  Renal ultrasound October 22, 2011 FINDINGS: Right Kidney: Renal measurements: 13.4 x 5.3 x 5.1 cm = volume: 188 mL. Echogenicity within normal limits. No mass or hydronephrosis visualized. Left Kidney: Renal measurements: 13.6 x 6.5 x 6.2 cm = volume: 286 mL. Echogenicity within normal limits. No mass or hydronephrosis visualized. Bladder: Appears normal for degree of bladder distention. Other: Prominent prostate measuring 5.5 x 4.8 x 4.5 cm (volume = 62 cm^3) IMPRESSION: No significant  sonographic abnormality of the kidneys or bladder. Electronically Signed   By: Maudry Mayhew MD   On: 08/22/2020 16:38   DG Chest Port 1 View  Result Date: 08/22/2020 CLINICAL DATA:  Fever and altered level of consciousness. EXAM: PORTABLE CHEST 1 VIEW COMPARISON:  Single-view of the chest 08/07/2020 and PA and lateral chest 12/21/2017. FINDINGS: Very mild bibasilar atelectasis process, pneumothorax. No consolidative or effusion. Lung volumes are somewhat low. Heart size is normal. No acute or focal bony abnormality. IMPRESSION: No acute disease. Electronically Signed   By: Drusilla Kanner M.D.   On: 08/22/2020 11:59   DG Chest Port 1 View  Result Date: 08/07/2020 CLINICAL DATA:  Possible sepsis.  Evaluate for pneumonia. EXAM: PORTABLE CHEST 1 VIEW COMPARISON:  10/23/2019 FINDINGS: Numerous leads and wires project over the chest. Patient rotated right. Midline trachea. Normal heart size. Apparent right paratracheal soft tissue fullness has been similar including back to 12/21/2017, favored to be related to AP portable technique and great vessels. Trace left costophrenic angle blunting is likely due to pleural thickening. Mild bibasilar scarring. No lobar consolidation. IMPRESSION: No acute cardiopulmonary disease. Electronically Signed   By: Jeronimo Greaves M.D.   On: 08/07/2020 13:26   DG Humerus Right  Result Date: 08/26/2020 CLINICAL DATA:  Pain EXAM: RIGHT SHOULDER - 1 VIEW; RIGHT HUMERUS - 2+ VIEW COMPARISON:  None. FINDINGS: There is no evidence of fracture or dislocation. There is no evidence of arthropathy or other focal bone abnormality. Soft tissues are unremarkable. IMPRESSION: No acute displaced fracture or dislocation. Evaluation is limited by single view technique of the right shoulder. Electronically Signed   By: Katherine Mantle M.D.   On: 08/26/2020 21:52   DG Swallowing Func-Speech Pathology  Result Date: 08/31/2020 Objective Swallowing Evaluation: Type of Study: MBS-Modified  Barium Swallow Study  Patient Details Name: RUBIN DAIS MRN: [id number] Date of Birth: 05/14/1941 Today's Date: 08/31/2020 Time: SLP Start Time (ACUTE ONLY): 1000 -SLP Stop Time (ACUTE ONLY): 1020 SLP Time Calculation (min) (ACUTE ONLY): 20 min Past Medical History: Past Medical History: Diagnosis Date . Aphagia  . Arthritis   "knees" (09/14/2018) . Blind left eye  . Chronic bronchitis (HCC)  . Dementia (HCC)  . Depression  . GERD (gastroesophageal reflux disease)  . Glaucoma  . History of kidney stones  . Hyperlipidemia  . Hypertension  . Pneumonia   "maybe twice" (09/14/2018) . Recurrent UTI (urinary tract infection)  . Stroke [facility]) 11/2011  "didn't effect him at the time" (09/14/2018) . Type II diabetes mellitus (HCC)  Past Surgical History: Past Surgical History: Procedure Laterality Date . ABDOMINAL HERNIA REPAIR   . CATARACT EXTRACTION W/ INTRAOCULAR LENS  IMPLANT, BILATERAL Bilateral  . COLONOSCOPY   . ESOPHAGOGASTRODUODENOSCOPY N/A 01/11/2019  Procedure: ESOPHAGOGASTRODUODENOSCOPY (EGD);  Surgeon: Meryl Dare, MD;  Location: [facility] ENDOSCOPY;  Service: Endoscopy;  Laterality: N/A; . ESOPHAGOGASTRODUODENOSCOPY Left 10/04/2019  Procedure:  ESOPHAGOGASTRODUODENOSCOPY (EGD);  Surgeon: Shellia Cleverly, DO;  Location: [facility] ENDOSCOPY;  Service: Gastroenterology;  Laterality: Left; . ESOPHAGOGASTRODUODENOSCOPY (EGD) WITH ESOPHAGEAL DILATION  X 2 . FOREIGN BODY REMOVAL  01/11/2019 . FRACTURE SURGERY   . HERNIA REPAIR   . IMPACTION REMOVAL  10/04/2019  Procedure: IMPACTION REMOVAL;  Surgeon: Shellia Cleverly, DO;  Location: [facility] ENDOSCOPY;  Service: Gastroenterology;; . PATELLA FRACTURE SURGERY Left  . POSTERIOR CERVICAL FUSION/FORAMINOTOMY   HPI: Pt is a 79 y.o. male with medical history significant of frontotemporal dementia, recurrent UTIs, BPH noncompliant with medications, HTN, IIDM, chronic ambulatory dysfunction, depression anxiety, who presented with fever, hypoxia, and AMS. Per H&P, pt nonverbal at baseline  and minimally responsive. Note from ED MD stated "He has history of prior stroke and is minimally verbal, however has been less vocal over the past 2 days." CT head: No acute intracranial abnormality. CXR was negative for acute changes. Pt found to have UTI, and acute on chronic encephalopathy.  No data recorded Assessment / Plan / Recommendation CHL IP CLINICAL IMPRESSIONS 08/31/2020 Clinical Impression Pt presents with oropharyngeal dysphagia characterized by impaired mastication, reduced bolus cohesion, and a pharyngeal delay. Mastication was moderately prolonged with dysphagia 3 solids. He exhibited premature spillage to the valleculae and pyriform sinuses. Penetration (PAS 3) and aspiration (PAS 8) were noted with nectar thick liquids and thin liquids secondary to the pharyngeal delay. Most instances of aspiration were silent but delayed throat clearing was occasionally noted following repeated episodes of aspiration. Immediate aspiration was reduced/eliminated with reduced bolus sizes, but penetration persisted and aspiration was ultimately noted. Postural modifications were attempted, but pt was unable to follow the necessary commands for completion of them. A dysphagia 1 diet with honey thick liquids is recommended at this time. SLP will follow for dysphagia treatment. SLP Visit Diagnosis Dysphagia, unspecified (R13.10) Attention and concentration deficit following -- Frontal lobe and executive function deficit following -- Impact on safety and function Moderate aspiration risk   CHL IP TREATMENT RECOMMENDATION 08/31/2020 Treatment Recommendations Therapy as outlined in treatment plan below   Prognosis 08/31/2020 Prognosis for Safe Diet Advancement Good Barriers to Reach Goals Cognitive deficits;Severity of deficits Barriers/Prognosis Comment -- CHL IP DIET RECOMMENDATION 08/31/2020 SLP Diet Recommendations Dysphagia 1 (Puree) solids;Honey thick liquids Liquid Administration via Cup;Straw Medication  Administration Crushed with puree Compensations Slow rate;Small sips/bites;Minimize environmental distractions Postural Changes Remain semi-upright after after feeds/meals (Comment);Seated upright at 90 degrees   CHL IP OTHER RECOMMENDATIONS 08/31/2020 Recommended Consults -- Oral Care Recommendations Oral care BID Other Recommendations --   CHL IP FOLLOW UP RECOMMENDATIONS 08/31/2020 Follow up Recommendations Skilled Nursing facility   [facility] IP FREQUENCY AND DURATION 08/31/2020 Speech Therapy Frequency (ACUTE ONLY) min 2x/week Treatment Duration 2 weeks      CHL IP ORAL PHASE 08/31/2020 Oral Phase Impaired Oral - Pudding Teaspoon -- Oral - Pudding Cup -- Oral - Honey Teaspoon -- Oral - Honey Cup Decreased bolus cohesion Oral - Nectar Teaspoon Decreased bolus cohesion Oral - Nectar Cup Decreased bolus cohesion Oral - Nectar Straw Decreased bolus cohesion;Premature spillage Oral - Thin Teaspoon -- Oral - Thin Cup Decreased bolus cohesion;Premature spillage Oral - Thin Straw Decreased bolus cohesion;Premature spillage Oral - Puree Decreased bolus cohesion;Premature spillage Oral - Mech Soft Impaired mastication Oral - Regular -- Oral - Multi-Consistency -- Oral - Pill -- Oral Phase - Comment --  CHL IP PHARYNGEAL PHASE 08/31/2020 Pharyngeal Phase Impaired Pharyngeal- Pudding Teaspoon -- Pharyngeal -- Pharyngeal- Pudding Cup -- Pharyngeal -- Pharyngeal- Honey Teaspoon  Delayed swallow initiation-pyriform sinuses Pharyngeal -- Pharyngeal- Honey Cup Delayed swallow initiation-pyriform sinuses Pharyngeal -- Pharyngeal- Nectar Teaspoon -- Pharyngeal -- Pharyngeal- Nectar Cup Delayed swallow initiation-pyriform sinuses;Penetration/Aspiration during swallow;Penetration/Apiration after swallow Pharyngeal Material enters airway, remains ABOVE vocal cords and not ejected out;Material enters airway, passes BELOW cords without attempt by patient to eject out (silent aspiration) Pharyngeal- Nectar Straw Delayed swallow  initiation-pyriform sinuses;Penetration/Aspiration during swallow;Penetration/Apiration after swallow Pharyngeal Material enters airway, passes BELOW cords without attempt by patient to eject out (silent aspiration) Pharyngeal- Thin Teaspoon -- Pharyngeal -- Pharyngeal- Thin Cup Delayed swallow initiation-pyriform sinuses;Penetration/Aspiration during swallow;Penetration/Apiration after swallow Pharyngeal Material enters airway, passes BELOW cords without attempt by patient to eject out (silent aspiration);Material enters airway, remains ABOVE vocal cords and not ejected out Pharyngeal- Thin Straw Delayed swallow initiation-pyriform sinuses;Penetration/Aspiration during swallow;Penetration/Apiration after swallow Pharyngeal Material enters airway, passes BELOW cords without attempt by patient to eject out (silent aspiration) Pharyngeal- Puree Delayed swallow initiation-pyriform sinuses Pharyngeal -- Pharyngeal- Mechanical Soft -- Pharyngeal -- Pharyngeal- Regular -- Pharyngeal -- Pharyngeal- Multi-consistency -- Pharyngeal -- Pharyngeal- Pill -- Pharyngeal -- Pharyngeal Comment --  CHL IP CERVICAL ESOPHAGEAL PHASE 08/31/2020 Cervical Esophageal Phase WFL Pudding Teaspoon -- Pudding Cup -- Honey Teaspoon -- Honey Cup -- Nectar Teaspoon -- Nectar Cup -- Nectar Straw -- Thin Teaspoon -- Thin Cup -- Thin Straw -- Puree -- Mechanical Soft -- Regular -- Multi-consistency -- Pill -- Cervical Esophageal Comment -- Shanika I. Vear Clock, MS, CCC-SLP Acute Rehabilitation Services Office number [phone] Pager [phone] Scheryl Marten 08/31/2020, 10:56 AM               Microbiology: Recent Results (from the past 240 hour(s))  Resp Panel by RT-PCR (Flu A&B, Covid) Nasopharyngeal Swab     Status: None   Collection Time: 08/29/20  9:11 AM   Specimen: Nasopharyngeal Swab; Nasopharyngeal(NP) swabs in vial transport medium  Result Value Ref Range Status   SARS Coronavirus 2 by RT PCR NEGATIVE NEGATIVE Final     Comment: (NOTE) SARS-CoV-2 target nucleic acids are NOT DETECTED.  The SARS-CoV-2 RNA is generally detectable in upper respiratory specimens during the acute phase of infection. The lowest concentration of SARS-CoV-2 viral copies this assay can detect is 138 copies/mL. A negative result does not preclude SARS-Cov-2 infection and should not be used as the sole basis for treatment or other patient management decisions. A negative result may occur with  improper specimen collection/handling, submission of specimen other than nasopharyngeal swab, presence of viral mutation(s) within the areas targeted by this assay, and inadequate number of viral copies(<138 copies/mL). A negative result must be combined with clinical observations, patient history, and epidemiological information. The expected result is Negative.  Fact Sheet for Patients:  BloggerCourse.com  Fact Sheet for Healthcare Providers:  SeriousBroker.it  This test is no t yet approved or cleared by the [place] FDA and  has been authorized for detection and/or diagnosis of SARS-CoV-2 by FDA under an Emergency Use Authorization (EUA). This EUA will remain  in effect (meaning this test can be used) for the duration of the COVID-19 declaration under Section 564(b)(1) of the Act, 21 U.S.C.section 360bbb-3(b)(1), unless the authorization is terminated  or revoked sooner.       Influenza A by PCR NEGATIVE NEGATIVE Final   Influenza B by PCR NEGATIVE NEGATIVE Final    Comment: (NOTE) The Xpert Xpress SARS-CoV-2/FLU/RSV plus assay is intended as an aid in the diagnosis of influenza from Nasopharyngeal swab specimens and should not be used as a  sole basis for treatment. Nasal washings and aspirates are unacceptable for Xpert Xpress SARS-CoV-2/FLU/RSV testing.  Fact Sheet for Patients: BloggerCourse.com  Fact Sheet for Healthcare  Providers: SeriousBroker.it  This test is not yet approved or cleared by the [place] FDA and has been authorized for detection and/or diagnosis of SARS-CoV-2 by FDA under an Emergency Use Authorization (EUA). This EUA will remain in effect (meaning this test can be used) for the duration of the COVID-19 declaration under Section 564(b)(1) of the Act, 21 U.S.C. section 360bbb-3(b)(1), unless the authorization is terminated or revoked.  Performed at [facility], [address] [place], [place] [address]   Resp Panel by RT-PCR (Flu A&B, Covid) Nasopharyngeal Swab     Status: None   Collection Time: 09/03/20  5:34 PM   Specimen: Nasopharyngeal Swab; Nasopharyngeal(NP) swabs in vial transport medium  Result Value Ref Range Status   SARS Coronavirus 2 by RT PCR NEGATIVE NEGATIVE Final    Comment: (NOTE) SARS-CoV-2 target nucleic acids are NOT DETECTED.  The SARS-CoV-2 RNA is generally detectable in upper respiratory specimens during the acute phase of infection. The lowest concentration of SARS-CoV-2 viral copies this assay can detect is 138 copies/mL. A negative result does not preclude SARS-Cov-2 infection and should not be used as the sole basis for treatment or other patient management decisions. A negative result may occur with  improper specimen collection/handling, submission of specimen other than nasopharyngeal swab, presence of viral mutation(s) within the areas targeted by this assay, and inadequate number of viral copies(<138 copies/mL). A negative result must be combined with clinical observations, patient history, and epidemiological information. The expected result is Negative.  Fact Sheet for Patients:  BloggerCourse.com  Fact Sheet for Healthcare Providers:  SeriousBroker.it  This test is no t yet approved or cleared by the [place] FDA and  has been authorized for  detection and/or diagnosis of SARS-CoV-2 by FDA under an Emergency Use Authorization (EUA). This EUA will remain  in effect (meaning this test can be used) for the duration of the COVID-19 declaration under Section 564(b)(1) of the Act, 21 U.S.C.section 360bbb-3(b)(1), unless the authorization is terminated  or revoked sooner.       Influenza A by PCR NEGATIVE NEGATIVE Final   Influenza B by PCR NEGATIVE NEGATIVE Final    Comment: (NOTE) The Xpert Xpress SARS-CoV-2/FLU/RSV plus assay is intended as an aid in the diagnosis of influenza from Nasopharyngeal swab specimens and should not be used as a sole basis for treatment. Nasal washings and aspirates are unacceptable for Xpert Xpress SARS-CoV-2/FLU/RSV testing.  Fact Sheet for Patients: BloggerCourse.com  Fact Sheet for Healthcare Providers: SeriousBroker.it  This test is not yet approved or cleared by the [place] FDA and has been authorized for detection and/or diagnosis of SARS-CoV-2 by FDA under an Emergency Use Authorization (EUA). This EUA will remain in effect (meaning this test can be used) for the duration of the COVID-19 declaration under Section 564(b)(1) of the Act, 21 U.S.C. section 360bbb-3(b)(1), unless the authorization is terminated or revoked.  Performed at [facility], [address], [place], [place] [address]      Labs:  CBG: Recent Labs  Lab 09/03/20 0617 09/03/20 1157 09/03/20 1604 09/03/20 2122 09/04/20 0618  GLUCAP 127* 163* 142* 265* 146*    Principal Problem:   Aspiration pneumonia (HCC) Active Problems:   Type II diabetes mellitus (HCC)   Encephalopathy   Urinary tract infection without hematuria   Frontotemporal dementia (HCC)  At risk for aspiration   Goals of care, counseling/discussion   Palliative care by specialist   DNR (do not resuscitate)   Acute hypoxemic respiratory failure (HCC)   Acute metabolic  encephalopathy   Time coordinating discharge: 35 minutes  Signed:  Brendia Sacks, MD  [facility]  09/04/2020, 8:19 AM

## 2020-09-03 NOTE — TOC Progression Note (Signed)
Transition of Care [facility]) - Progression Note    Patient Details  Name: Daniel Morrow MRN: [id number] Date of Birth: 1941-03-04  [facility] of [facility] CM/SW Contact  [name], [place] Phone Number: 09/03/2020, 4:38 PM  Clinical Narrative:    CSW has been following this pt and working with pt's wife [name] to establish safe discharge. She has had two bed offers for several days and was given a third memory care option this morning. Pt's dr has put in the discharge summary as pt has been medically stable for a while. Pt's wife was also offered HH options with [facility]. Pt requested a list of SNF's That had declined, this was provided, it is extensive and can be found on the hub. She was informed that many places did not take her insurance. She then wanted to look into private pay at [facility], CSW got her a qoute, but [facility] will not accept pt clinically. CSW has exhausted options and the wife asked to talk to [place], CSW has left multiple messages for them. The appeal process has been explained to the wife, paperwork was left at pt's bedside, but she stated she did not want it as she refused to appeal. She would not let CSW give her the number. CSW notified supervisor, attempted to contact nursing director on the unit. Family finally chose [facility] and they will start insurance authorization, and follow up with bed details tomorrow. CSW will be signing off, but will leave a detailed note for oncoming CSW. SW will continue to follow for DC planning.   Expected Discharge Plan: Skilled Nursing Facility Barriers to Discharge: Continued Medical Work up,Insurance Authorization,SNF Pending bed offer  Expected Discharge Plan and Services Expected Discharge Plan: Skilled Nursing Facility     Post Acute Care Choice: Skilled Nursing Facility Living arrangements for the past 2 months: Single Family Home Expected Discharge Date: 09/03/20                                      Social Determinants of Health (SDOH) Interventions    Readmission Risk Interventions No flowsheet data found.

## 2020-09-04 LAB — GLUCOSE, CAPILLARY
Glucose-Capillary: 146 mg/dL — ABNORMAL HIGH (ref 70–99)
Glucose-Capillary: 208 mg/dL — ABNORMAL HIGH (ref 70–99)
Glucose-Capillary: 245 mg/dL — ABNORMAL HIGH (ref 70–99)
Glucose-Capillary: 160 mg/dL — ABNORMAL HIGH (ref 70–99)

## 2020-09-04 MED ORDER — QUETIAPINE FUMARATE 50 MG PO TABS: 50.0000 mg | ORAL_TABLET | Freq: Every day | ORAL | Status: DC

## 2020-09-04 MED ORDER — QUETIAPINE FUMARATE 25 MG PO TABS: 25.0000 mg | ORAL_TABLET | Freq: Every morning | ORAL | Status: DC

## 2020-09-04 MED ORDER — QUETIAPINE FUMARATE 50 MG PO TABS
50.0000 mg | ORAL_TABLET | Freq: Every day | ORAL | Status: DC
  Administered 2020-09-04 – 2020-09-05 (×2): 50 mg via ORAL
  Filled 2020-09-04 (×2): qty 1

## 2020-09-04 MED ORDER — QUETIAPINE FUMARATE 25 MG PO TABS
25.0000 mg | ORAL_TABLET | Freq: Every morning | ORAL | Status: DC
  Administered 2020-09-05 – 2020-09-06 (×2): 25 mg via ORAL
  Filled 2020-09-04 (×3): qty 1

## 2020-09-04 MED ORDER — LORAZEPAM 1 MG PO TABS
1.0000 mg | ORAL_TABLET | Freq: Once | ORAL | Status: DC | PRN
  Filled 2020-09-04: qty 1

## 2020-09-04 NOTE — Care Management Important Message (Signed)
Important Message  Patient Details  Name: Daniel Morrow MRN: [id number] Date of Birth: 10-20-1940   Medicare Important Message Given:  Yes     Demya Scruggs P Cejay Cambre 09/04/2020, 12:40 PM

## 2020-09-04 NOTE — Progress Notes (Addendum)
PROGRESS NOTE  Daniel Morrow HKV:[id number] DOB: 12-Feb-1941 DOA: 08/22/2020 PCP: Kirby Funk, MD  Brief History   79 year old man PMH frontotemporal dementia, as well as being wheelchair and bedbound for the last 4 weeks prior to presentation, presented with fever and hypoxia.  Admitted for acute on chronic metabolic encephalopathy thought secondary to aspiration pneumonia.  Plan for SNF.  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  Wife is aware of increased risk of setting cardiac issues with quetiapine. --Continue diet as per speech therapy.  Diabetes mellitus type 2 --Blood sugars stable.  Continue Lantus, meal coverage.  Profound ambulatory dysfunction, fall in hospital 12/5 with supraorbital right eye laceration requiring 5 sutures.  CT head negative. --Sutures removed 12/12  RESOLVED . Acute metabolic encephalopathy present on admission. Secondary to aspiration pneumonia, UTI on admission, complicated by dementia. . Acute hypoxic respiratory failure secondary to aspiration pneumonia . Recurrent UTI  Disposition Plan:  Discussion: as above  Status is: Inpatient  Remains inpatient appropriate because: Remains stable for discharge  Dispo:  Patient From: Home  Planned Disposition: Skilled Nursing Facility  Expected discharge date: When bed available  Medically stable for discharge: Yes  DVT prophylaxis: enoxaparin (LOVENOX) injection 40 mg Start: 08/22/20 1615   Code Status: DNR Family Communication: Wife at bedside  Brendia Sacks, MD  [facility] Direct contact: see [email or web address] (further directions at bottom of note if needed) 7PM-7AM contact night coverage as at bottom of note 09/04/2020, 2:59 PM  LOS: 13 days   Significant Hospital Events   .    Consults:  . PMT   Procedures:  .   Significant Diagnostic Tests:   Marland Kitchen    Micro Data:  .    Antimicrobials:  .   Interval History/Subjective  CC: f/u fall  Very agitated last night Resting comfortably today  Objective   Vitals:  Vitals:   09/04/20 0828 09/04/20 1243  BP: 114/60 (!) 124/57  Pulse: (!) 57 78  Resp: 18 17  Temp: (!) 97 F (36.1 C) (!) 97.2 F (36.2 C)  SpO2: 94% 94%    Exam:  Constitutional:   . Appears calm and comfortable Respiratory:  . CTA bilaterally, no w/r/r.  . Respiratory effort normal.  Cardiovascular:  . RRR, no m/r/g . No LE extremity edema    I have personally reviewed the following:   Today's Data  . CBG stable . EKG SB no acute changes   Scheduled Meds: . amLODipine  10 mg Oral Daily  . brimonidine  1 drop Right Eye BID  . enoxaparin (LOVENOX) injection  40 mg Subcutaneous Q24H  . insulin aspart  0-15 Units Subcutaneous TID WC  . insulin aspart  0-5 Units Subcutaneous QHS  . insulin aspart  5 Units Subcutaneous TID WC  . insulin glargine  15 Units Subcutaneous QHS  . latanoprost  1 drop Right Eye QHS  . lisinopril  2.5 mg Oral BID  . [START ON 09/05/2020] QUEtiapine  25 mg Oral q AM  . QUEtiapine  50 mg Oral QHS  . tamsulosin  0.4 mg Oral QPC supper  . timolol  1 drop Right Eye BID  . traZODone  100 mg Oral QHS   Continuous Infusions: . sertraline      Principal Problem:   Aspiration pneumonia (HCC) Active Problems:   Type II diabetes mellitus (HCC)   Encephalopathy   Urinary tract infection  without hematuria   Frontotemporal dementia (HCC)   At risk for aspiration   Goals of care, counseling/discussion   Palliative care by specialist   DNR (do not resuscitate)   Acute hypoxemic respiratory failure (HCC)   Acute metabolic encephalopathy   LOS: 13 days   How to contact the [facility] Attending or Consulting provider 7A - 7P or covering provider during after hours 7P -7A, for this patient?  1. Check the care team in [facility] and look for a) attending/consulting TRH provider listed and  b) the [facility] team listed 2. Log into [email or web address] and use [place]'s universal password to access. If you do not have the password, please contact the hospital operator. 3. Locate the [facility] provider you are looking for under [facility] and page to a number that you can be directly reached. 4. If you still have difficulty reaching the provider, please page the [facility] (Director on Call) for the Hospitalists listed on amion for assistance.

## 2020-09-04 NOTE — TOC Progression Note (Signed)
Transition of Care [facility]) - Progression Note    Patient Details  Name: Daniel Morrow MRN: [id number] Date of Birth: [birth year]  [facility]) CM/SW Contact  Lorri Frederick, LCSW Phone Number: 09/04/2020, 9:19 AM  Clinical Narrative:   CSW spoke with Maurine Minister at [facility] who confirmed they are submitting SNF auth today, will call when they have approval.    Expected Discharge Plan: Skilled Nursing Facility Barriers to Discharge: Continued Medical Work up,Insurance Authorization,SNF Pending bed offer  Expected Discharge Plan and Services Expected Discharge Plan: Skilled Nursing Facility     Post Acute Care Choice: Skilled Nursing Facility Living arrangements for the past 2 months: Single Family Home Expected Discharge Date: 09/03/20                                     Social Determinants of Health (SDOH) Interventions    Readmission Risk Interventions No flowsheet data found.

## 2020-09-04 NOTE — Progress Notes (Signed)
Physical Therapy Treatment Patient Details Name: Daniel Morrow MRN: [id number] DOB: Jun 23, 1941 Today's Date: 09/04/2020    History of Present Illness 79 y.o. male  with past medical history of frontotemporal dementia, recurrent UTI's, BPH noncompliant with medications, HTN, DM type 2, depression, anxiety admitted on 08/22/2020 with altered mental status. Pt experiencing multiple falls over the last few weeks. Pt found to have aspiration PNA, UTI, and acute on chronic encephalopathy.    PT Comments    PT checked on pt several times this afternoon and he has been persistently sleeping. This time pt soaked of urine so attempted to wake him with bed mobility and changing of linens, gown etc to progress mobility. Pt partially woke and followed 10% of instructional commands but not awake enough to attempt getting OOB. Gentle ROM performed to LE's and repositioning in bed for improved alignment. PT will continue to follow if pt more alert next visits.     Follow Up Recommendations  SNF;Supervision/Assistance - 24 hour     Equipment Recommendations  Wheelchair (measurements PT)    Recommendations for Other Services       Precautions / Restrictions Precautions Precautions: Fall Precaution Comments: mostly nonverbal at baseline Restrictions Weight Bearing Restrictions: No    Mobility  Bed Mobility Overal bed mobility: Needs Assistance Bed Mobility: Rolling Rolling: Min assist         General bed mobility comments: facilitated rolling to each side for clean up of soiled sheets and to help pt wake up. He awoke partially but not enough to safely sit up  Transfers                    Ambulation/Gait                 Stairs             Wheelchair Mobility    Modified Rankin (Stroke Patients Only)       Balance                                            Cognition Arousal/Alertness: Lethargic   Overall Cognitive Status: History of  cognitive impairments - at baseline                                 General Comments: pt following commands 10% of time this afternoon      Exercises      General Comments General comments (skin integrity, edema, etc.): repositioned pt in bed as well as aligning pt's head, shoulders, and hips. Gentle ROM to LE's.      Pertinent Vitals/Pain Pain Assessment: Faces Faces Pain Scale: No hurt    Home Living                      Prior Function            PT Goals (current goals can now be found in the care plan section) Acute Rehab PT Goals Patient Stated Goal: per wife to get to facility PT Goal Formulation: With family Time For Goal Achievement: 09/08/20 Potential to Achieve Goals: Fair Progress towards PT goals: Not progressing toward goals - comment (lethargy)    Frequency    Min 2X/week      PT Plan Current plan remains appropriate  Co-evaluation              AM-PAC PT "6 Clicks" Mobility   Outcome Measure  Help needed turning from your back to your side while in a flat bed without using bedrails?: A Little Help needed moving from lying on your back to sitting on the side of a flat bed without using bedrails?: A Little Help needed moving to and from a bed to a chair (including a wheelchair)?: A Lot Help needed standing up from a chair using your arms (e.g., wheelchair or bedside chair)?: A Lot Help needed to walk in hospital room?: A Lot Help needed climbing 3-5 steps with a railing? : Total 6 Click Score: 13    End of Session   Activity Tolerance: Patient limited by lethargy Patient left: in bed;with call bell/phone within reach;with bed alarm set Nurse Communication: Mobility status PT Visit Diagnosis: Unsteadiness on feet (R26.81)     Time: 3244-0102 PT Time Calculation (min) (ACUTE ONLY): 13 min  Charges:  $Therapeutic Activity: 8-22 mins                     Lyanne Co, PT  Acute Rehab Services  Pager  [phone] Office [phone]    Lawana Chambers Keshawn Fiorito 09/04/2020, 4:12 PM

## 2020-09-04 NOTE — Progress Notes (Signed)
OT Cancellation Note  Patient Details Name: Daniel Morrow MRN: [id number] DOB: [birth year]   Cancelled Treatment:    Reason Eval/Treat Not Completed: Patient's level of consciousness Pt sleeping soundly after large bowel movement per RN. OT to check back at a more appropriate time. Rn requesting patient rest at this time.  Wynona Neat, OTR/L  Acute Rehabilitation Services Pager: [phone] Office: [phone] .  09/04/2020, 3:28 PM

## 2020-09-05 LAB — GLUCOSE, CAPILLARY
Glucose-Capillary: 96 mg/dL (ref 70–99)
Glucose-Capillary: 148 mg/dL — ABNORMAL HIGH (ref 70–99)
Glucose-Capillary: 217 mg/dL — ABNORMAL HIGH (ref 70–99)
Glucose-Capillary: 171 mg/dL — ABNORMAL HIGH (ref 70–99)

## 2020-09-05 MED ORDER — LORAZEPAM 1 MG PO TABS
1.0000 mg | ORAL_TABLET | Freq: Four times a day (QID) | ORAL | Status: DC | PRN
  Administered 2020-09-05: 1 mg via ORAL
  Filled 2020-09-05: qty 1

## 2020-09-05 NOTE — Care Management Important Message (Signed)
Important Message  Patient Details  Name: MONTRAY KLIEBERT MRN: [id number] Date of Birth: [birth year]   Medicare Important Message Given:  Yes  Tried to call the room No answer IM mailed to the patients home. Dene Landsberg 09/05/2020, 3:01 PM

## 2020-09-05 NOTE — Progress Notes (Signed)
Occupational Therapy Treatment Patient Details Name: Daniel Morrow MRN: [id number] DOB: March 23, 1941 Today's Date: 09/05/2020    History of present illness 79 y.o. male  with past medical history of frontotemporal dementia, recurrent UTI's, BPH noncompliant with medications, HTN, DM type 2, depression, anxiety admitted on 08/22/2020 with altered mental status. Pt experiencing multiple falls over the last few weeks. Pt found to have aspiration PNA, UTI, and acute on chronic encephalopathy.   OT comments  Pt assisted to chair again this session to decrease fall risk and met patients need. Pt tolerating sitting in chair with staff aware.    Follow Up Recommendations  SNF    Equipment Recommendations  Wheelchair (measurements OT);Wheelchair cushion (measurements OT);Hospital bed;3 in 1 bedside commode    Recommendations for Other Services Other (comment)    Precautions / Restrictions Precautions Precautions: Fall Precaution Comments: mostly nonverbal at baseline       Mobility Bed Mobility Overal bed mobility: Needs Assistance Bed Mobility: Rolling;Supine to Sit Rolling: Mod assist   Supine to sit: Mod assist     General bed mobility comments: pt with bil Le off eob on arrival with bed alarm sounding. condom foley doff and mesh underwear partially doff  Transfers Overall transfer level: Needs assistance Equipment used: Rolling walker (2 wheeled) Transfers: Sit to/from Stand Sit to Stand: Mod assist         General transfer comment: bed elevated and with L lateral learn    Balance Overall balance assessment: Needs assistance Sitting-balance support: Bilateral upper extremity supported;Feet supported Sitting balance-Leahy Scale: Fair     Standing balance support: Bilateral upper extremity supported;During functional activity Standing balance-Leahy Scale: Poor Standing balance comment: reliant on external support and RW                           ADL  either performed or assessed with clinical judgement   ADL Overall ADL's : Needs assistance/impaired Eating/Feeding: Minimal assistance Eating/Feeding Details (indicate cue type and reason): requesting drink                                 Functional mobility during ADLs: Moderate assistance General ADL Comments: Pt attempting to     Vision       Perception     Praxis      Cognition Arousal/Alertness: Awake/alert Behavior During Therapy: Flat affect Overall Cognitive Status: History of cognitive impairments - at baseline                                 General Comments: follow simple commands, following 2 step command. making very request        Exercises     Shoulder Instructions       General Comments pt asked if he would like the bed or chair . pt states chair. pt sitting in chair end of session with posey chair alarm. pt provided honey thick water 4 oz during session. pt very alert and communicating    Pertinent Vitals/ Pain          Home Living                                          Prior Functioning/Environment  Frequency  Min 2X/week        Progress Toward Goals  OT Goals(current goals can now be found in the care plan section)  Progress towards OT goals: Progressing toward goals  Acute Rehab OT Goals Patient Stated Goal: Chair OT Goal Formulation: Patient unable to participate in goal setting Time For Goal Achievement: 09/19/20 Potential to Achieve Goals: Fair ADL Goals Pt Will Perform Grooming: with min assist;sitting Pt Will Perform Lower Body Bathing: with mod assist;sit to/from stand Pt Will Perform Upper Body Dressing: with min assist;sitting Pt Will Perform Lower Body Dressing: with mod assist;sit to/from stand Pt Will Transfer to Toilet: with min assist;stand pivot transfer;bedside commode;ambulating Pt Will Perform Toileting - Clothing Manipulation and hygiene: with mod  assist;sit to/from stand  Plan Discharge plan remains appropriate    Co-evaluation                 AM-PAC OT "6 Clicks" Daily Activity     Outcome Measure   Help from another person eating meals?: A Lot Help from another person taking care of personal grooming?: A Lot Help from another person toileting, which includes using toliet, bedpan, or urinal?: Total Help from another person bathing (including washing, rinsing, drying)?: A Lot Help from another person to put on and taking off regular upper body clothing?: A Lot Help from another person to put on and taking off regular lower body clothing?: A Lot 6 Click Score: 11    End of Session Equipment Utilized During Treatment: Gait belt;Rolling walker  OT Visit Diagnosis: Other symptoms and signs involving cognitive function;Other abnormalities of gait and mobility (R26.89);Muscle weakness (generalized) (M62.81)   Activity Tolerance Patient tolerated treatment well   Patient Left in chair;with call bell/phone within reach;with chair alarm set;with restraints reapplied (posey alarm bed)   Nurse Communication Mobility status;Precautions        Time: 1660-6301 OT Time Calculation (min): 13 min  Charges: OT General Charges $OT Visit: 1 Visit OT Treatments $Self Care/Home Management : 8-22 mins   Brynn, OTR/L  Acute Rehabilitation Services Pager: [phone] Office: [phone] .    Jeri Modena 09/05/2020, 4:03 PM

## 2020-09-05 NOTE — Progress Notes (Addendum)
[facility] Progress Note  Patient: Daniel Morrow    DJT:[id number]  DOA: 08/22/2020     Date of Service: the patient was seen and examined on 09/05/2020  Brief hospital course: Past medical history of frontoparietal dementia, BPH, HTN, type II DM, depression, anxiety, recurrent UTI. Presents with complaints of confusion and fever and hypoxia. Admitted for acute on chronic metabolic encephalopathy as well as aspiration pneumonia.  Currently plan is identified safe discharge plan.  Assessment and Plan: 1. Acute on chronic metabolic encephalopathy POA. Chronic encephalopathy in the setting of dementia. Acute event likely pneumonia plus UTI. Currently encephalopathy is improving. Continue as needed Ativan, scheduled Seroquel and Zoloft. Awake but unable to follow commands today. Palliative care was consulted as well. PT recommends SNF. Currently awaiting placement.  2. Acute hypoxic respiratory failure, POA secondary to aspiration pneumonia UTI Currently on room air. Treated with antibiotics. Monitor.  3. Frontoparietal dementia Dysphagia Behavioral disturbances Recurrent fall Continue current medication. Fall precaution. Monitor. Follow-up on speech therapy recommendation  4. Type 2 diabetes mellitus, controlled. No complication. Continue sliding scale insulin.  5. Fall 12/5 Supraorbital right eye laceration requiring 5 stitches Sutures removed 12/12 Currently improving. Fall precautions.  6. Disposition PT has recommended SNF. Social worker has initiated SNF search.  Insurance authorization is currently pending.  Diet: D1 diet honey thick DVT Prophylaxis:   enoxaparin (LOVENOX) injection 40 mg Start: 08/22/20 1615    Advance goals of care discussion: DNR  Family Communication: no family was present at bedside, at the time of interview.   Disposition:  Status is: Inpatient  Remains inpatient appropriate because:Unsafe d/c plan  Dispo:  Patient  From: Home  Planned Disposition: Skilled Nursing Facility  Expected discharge date: 09/06/2020  Medically stable for discharge: Yes  Subjective: No nausea no vomiting. No fever no chills earlier in the afternoon the patient was agitated and was trying to come out of the chair.  Physical Exam:  General: Appear in mild distress, no Rash; Oral Mucosa Clear, moist. no Abnormal Neck Mass Or lumps, Conjunctiva normal  Cardiovascular: S1 and S2 Present, no Murmur, Respiratory: good respiratory effort, Bilateral Air entry present and CTA, no Crackles, no wheezes Abdomen: Bowel Sound present, Soft and no tenderness Extremities: no Pedal edema Neurology: alert and not oriented to time, place, and person affect flat in affect. no new focal deficit Gait not checked due to patient safety concerns  Vitals:   09/05/20 0418 09/05/20 0744 09/05/20 1240 09/05/20 1525  BP: 117/62 133/70 131/79 117/65  Pulse: 63 66 69 73  Resp: 17 18 16 16   Temp: 97.7 F (36.5 C) 97.7 F (36.5 C) 98 F (36.7 C) 98.1 F (36.7 C)  TempSrc: Oral Oral Oral Oral  SpO2: 95% 94% 100% 99%  Weight:      Height:        Intake/Output Summary (Last 24 hours) at 09/05/2020 1759 Last data filed at 09/05/2020 1320 Gross per 24 hour  Intake 400 ml  Output --  Net 400 ml   Filed Weights   08/23/20 0900  Weight: 74.5 kg    Data Reviewed: I have personally reviewed and interpreted daily labs, tele strips, imagings as discussed above. I reviewed all nursing notes, pharmacy notes, vitals, pertinent old records I have discussed plan of care as described above with RN and patient/family.  CBC: No results for input(s): WBC, NEUTROABS, HGB, HCT, MCV, PLT in the last 168 hours. Basic Metabolic Panel: No results for input(s): NA, K, CL, CO2,  GLUCOSE, BUN, CREATININE, CALCIUM, MG, PHOS in the last 168 hours.  Studies: No results found.  Scheduled Meds: . amLODipine  10 mg Oral Daily  . brimonidine  1 drop Right Eye BID   . enoxaparin (LOVENOX) injection  40 mg Subcutaneous Q24H  . insulin aspart  0-15 Units Subcutaneous TID WC  . insulin aspart  0-5 Units Subcutaneous QHS  . insulin aspart  5 Units Subcutaneous TID WC  . insulin glargine  15 Units Subcutaneous QHS  . latanoprost  1 drop Right Eye QHS  . lisinopril  2.5 mg Oral BID  . QUEtiapine  25 mg Oral q AM  . QUEtiapine  50 mg Oral QHS  . sertraline  150 mg Oral Daily  . tamsulosin  0.4 mg Oral QPC supper  . timolol  1 drop Right Eye BID  . traZODone  100 mg Oral QHS   Continuous Infusions: PRN Meds: acetaminophen **OR** acetaminophen, LORazepam, Resource ThickenUp Clear  Time spent: 35 minutes  Author: Lynden Oxford, MD [facility] 09/05/2020 5:59 PM  To reach On-call, see care teams to locate the attending and reach out via [email or web address]. Between 7PM-7AM, please contact night-coverage If you still have difficulty reaching the attending provider, please page the [facility] (Director on Call) for [facility] on amion for assistance.

## 2020-09-05 NOTE — Progress Notes (Signed)
Pt keeps trying to get out of bed/chair, is pulling at mitts, puling at gown, and removing chair safety belt. Pt was offered tioleting, food and drink but was still agitated. MD notified. PRN orders in.

## 2020-09-05 NOTE — Evaluation (Signed)
Occupational Therapy Evaluation Patient Details Name: Daniel Morrow MRN: [id number] DOB: 04-27-1941 Today's Date: 09/05/2020    History of Present Illness 79 y.o. male  with past medical history of frontotemporal dementia, recurrent UTI's, BPH noncompliant with medications, HTN, DM type 2, depression, anxiety admitted on 08/22/2020 with altered mental status. Pt experiencing multiple falls over the last few weeks. Pt found to have aspiration PNA, UTI, and acute on chronic encephalopathy.   Clinical Impression   Pt making verbalized request at this time and sitting up in chair with good activity tolerance. Pt appears to welcome the OOB posture. Recommendation for SNF.     Follow Up Recommendations  SNF    Equipment Recommendations  Wheelchair (measurements OT);Wheelchair cushion (measurements OT);Hospital bed;3 in 1 bedside commode    Recommendations for Other Services Other (comment)     Precautions / Restrictions Precautions Precautions: Fall Precaution Comments: mostly nonverbal at baseline      Mobility Bed Mobility Overal bed mobility: Needs Assistance Bed Mobility: Rolling;Supine to Sit Rolling: Min assist   Supine to sit: Mod assist     General bed mobility comments: pt with high low bed that is not working and Engineer, manufacturing notified. Pt requires increased assistnace due to low sure    Transfers Overall transfer level: Needs assistance Equipment used: Rolling walker (2 wheeled) Transfers: Sit to/from Stand Sit to Stand: Mod assist         General transfer comment: requires increased (A) due to low bed surface and unable to elevate bed due to bed being broken    Balance Overall balance assessment: Needs assistance Sitting-balance support: Bilateral upper extremity supported;Feet supported Sitting balance-Leahy Scale: Fair     Standing balance support: Bilateral upper extremity supported;During functional activity Standing balance-Leahy Scale:  Poor Standing balance comment: reliant on external support and RW                           ADL either performed or assessed with clinical judgement   ADL Overall ADL's : Needs assistance/impaired Eating/Feeding: Minimal assistance Eating/Feeding Details (indicate cue type and reason): pt drinking from cup with tremor noted. OT helping stablize cup and able to complete task. Ot helping pace the volume patient intacking of honey thick liquid. pt noted to have very dry looing lips Grooming: Oral care;Moderate assistance;Sitting Grooming Details (indicate cue type and reason): pt initiates task but only brushing L side of mouth. pt unable to spit after task but appropriately using tooth brush                 Toilet Transfer: Minimal assistance;Stand-pivot Toilet Transfer Details (indicate cue type and reason): simulate bed to chair         Functional mobility during ADLs: Minimal assistance;Rolling walker General ADL Comments: pt drinking 4 oz apple juice and 4 oz- 6 oz thicken water. pt self terminate task     Vision         Perception     Praxis      Pertinent Vitals/Pain Pain Assessment: No/denies pain     Hand Dominance     Extremity/Trunk Assessment Upper Extremity Assessment Upper Extremity Assessment: Generalized weakness   Lower Extremity Assessment Lower Extremity Assessment: Defer to PT evaluation       Communication     Cognition   Behavior During Therapy: Flat affect Overall Cognitive Status: History of cognitive impairments - at baseline  General Comments: follow simple commands, following 2 step command. making very request   General Comments  noted to have skin rash at buttock and barrier cream applied. High risk for skin break down due to incontinence and prolonged position in supine    Exercises     Shoulder Instructions      Home Living                                           Prior Functioning/Environment                   OT Problem List:        OT Treatment/Interventions:      OT Goals(Current goals can be found in the care plan section) Acute Rehab OT Goals Patient Stated Goal: "water I want water" OT Goal Formulation: Patient unable to participate in goal setting Time For Goal Achievement: 09/19/20 Potential to Achieve Goals: Fair ADL Goals Pt Will Perform Grooming: with min assist;sitting Pt Will Perform Lower Body Bathing: with mod assist;sit to/from stand Pt Will Perform Upper Body Dressing: with min assist;sitting Pt Will Perform Lower Body Dressing: with mod assist;sit to/from stand Pt Will Transfer to Toilet: with min assist;stand pivot transfer;bedside commode;ambulating Pt Will Perform Toileting - Clothing Manipulation and hygiene: with mod assist;sit to/from stand  OT Frequency: Min 2X/week   Barriers to D/C:            Co-evaluation              AM-PAC OT "6 Clicks" Daily Activity     Outcome Measure Help from another person eating meals?: A Lot Help from another person taking care of personal grooming?: A Lot Help from another person toileting, which includes using toliet, bedpan, or urinal?: Total Help from another person bathing (including washing, rinsing, drying)?: A Lot Help from another person to put on and taking off regular upper body clothing?: A Lot Help from another person to put on and taking off regular lower body clothing?: A Lot 6 Click Score: 11   End of Session Equipment Utilized During Treatment: Gait belt;Rolling walker Nurse Communication: Mobility status;Precautions  Activity Tolerance: Patient tolerated treatment well Patient left: in chair;with call bell/phone within reach;with chair alarm set;with restraints reapplied (posey alarm bed)  OT Visit Diagnosis: Other symptoms and signs involving cognitive function;Other abnormalities of gait and mobility (R26.89);Muscle weakness  (generalized) (M62.81)                Time: 0950-1020 OT Time Calculation (min): 30 min Charges:  OT General Charges $OT Visit: 1 Visit OT Treatments $Self Care/Home Management : 23-37 mins   Brynn, OTR/L  Acute Rehabilitation Services Pager: [phone] Office: [phone] .   Mateo Flow 09/05/2020, 11:35 AM

## 2020-09-06 DIAGNOSIS — R4182 Altered mental status, unspecified: Secondary | ICD-10-CM | POA: Diagnosis not present

## 2020-09-06 DIAGNOSIS — J690 Pneumonitis due to inhalation of food and vomit: Secondary | ICD-10-CM | POA: Diagnosis not present

## 2020-09-06 DIAGNOSIS — Z7401 Bed confinement status: Secondary | ICD-10-CM | POA: Diagnosis not present

## 2020-09-06 DIAGNOSIS — Z03818 Encounter for observation for suspected exposure to other biological agents ruled out: Secondary | ICD-10-CM | POA: Diagnosis not present

## 2020-09-06 DIAGNOSIS — N390 Urinary tract infection, site not specified: Secondary | ICD-10-CM | POA: Diagnosis not present

## 2020-09-06 DIAGNOSIS — J698 Pneumonitis due to inhalation of other solids and liquids: Secondary | ICD-10-CM | POA: Diagnosis not present

## 2020-09-06 DIAGNOSIS — I959 Hypotension, unspecified: Secondary | ICD-10-CM | POA: Diagnosis not present

## 2020-09-06 DIAGNOSIS — J9601 Acute respiratory failure with hypoxia: Secondary | ICD-10-CM | POA: Diagnosis not present

## 2020-09-06 DIAGNOSIS — N401 Enlarged prostate with lower urinary tract symptoms: Secondary | ICD-10-CM | POA: Diagnosis not present

## 2020-09-06 DIAGNOSIS — G9341 Metabolic encephalopathy: Secondary | ICD-10-CM | POA: Diagnosis not present

## 2020-09-06 DIAGNOSIS — J189 Pneumonia, unspecified organism: Secondary | ICD-10-CM | POA: Diagnosis not present

## 2020-09-06 DIAGNOSIS — M2550 Pain in unspecified joint: Secondary | ICD-10-CM | POA: Diagnosis not present

## 2020-09-06 DIAGNOSIS — E119 Type 2 diabetes mellitus without complications: Secondary | ICD-10-CM | POA: Diagnosis not present

## 2020-09-06 DIAGNOSIS — I10 Essential (primary) hypertension: Secondary | ICD-10-CM | POA: Diagnosis not present

## 2020-09-06 DIAGNOSIS — R509 Fever, unspecified: Secondary | ICD-10-CM | POA: Diagnosis not present

## 2020-09-06 DIAGNOSIS — R69 Illness, unspecified: Secondary | ICD-10-CM | POA: Diagnosis not present

## 2020-09-06 DIAGNOSIS — Z20822 Contact with and (suspected) exposure to covid-19: Secondary | ICD-10-CM | POA: Diagnosis not present

## 2020-09-06 DIAGNOSIS — G3109 Other frontotemporal dementia: Secondary | ICD-10-CM | POA: Diagnosis not present

## 2020-09-06 LAB — RESP PANEL BY RT-PCR (FLU A&B, COVID) ARPGX2
Influenza A by PCR: NEGATIVE
Influenza B by PCR: NEGATIVE
SARS Coronavirus 2 by RT PCR: NEGATIVE

## 2020-09-06 LAB — GLUCOSE, CAPILLARY
Glucose-Capillary: 122 mg/dL — ABNORMAL HIGH (ref 70–99)
Glucose-Capillary: 73 mg/dL (ref 70–99)

## 2020-09-06 MED ORDER — SERTRALINE HCL 100 MG PO TABS: 150.0000 mg | ORAL_TABLET | Freq: Every day | ORAL | 0 refills | Status: DC

## 2020-09-06 MED ORDER — QUETIAPINE FUMARATE 25 MG PO TABS: 25.0000 mg | ORAL_TABLET | Freq: Three times a day (TID) | ORAL | 0 refills | Status: AC

## 2020-09-06 MED ORDER — LISINOPRIL 2.5 MG PO TABS: 2.5000 mg | ORAL_TABLET | Freq: Every day | ORAL | 0 refills | Status: DC

## 2020-09-06 MED ORDER — QUETIAPINE FUMARATE 25 MG PO TABS: 25.0000 mg | ORAL_TABLET | Freq: Three times a day (TID) | ORAL | Status: DC

## 2020-09-06 MED ORDER — LORAZEPAM 0.5 MG PO TABS
0.5000 mg | ORAL_TABLET | Freq: Four times a day (QID) | ORAL | Status: DC | PRN
  Administered 2020-09-06: 0.5 mg via ORAL
  Filled 2020-09-06: qty 1

## 2020-09-06 MED ORDER — INSULIN GLARGINE 100 UNIT/ML ~~LOC~~ SOLN: 15.0000 [IU] | Freq: Every day | SUBCUTANEOUS | 0 refills | Status: DC

## 2020-09-06 MED ORDER — CLONAZEPAM 0.5 MG PO TABS: 0.5000 mg | ORAL_TABLET | Freq: Two times a day (BID) | ORAL | 0 refills | Status: DC | PRN

## 2020-09-06 NOTE — Progress Notes (Signed)
Attempted to call for report again. Was transferred but no one answered the phone. Will attempt to call again

## 2020-09-06 NOTE — Discharge Summary (Addendum)
[facility] Discharge Summary   Patient: Daniel Morrow ZOX:[id number]  PCP: Kirby Funk, MD  Date of admission: 08/22/2020   Date of discharge:  09/06/2020     Discharge Diagnoses:  Principal Problem:   Aspiration pneumonia (HCC) Active Problems:   Type II diabetes mellitus (HCC)   Encephalopathy   Urinary tract infection without hematuria   Frontotemporal dementia (HCC)   At risk for aspiration   Goals of care, counseling/discussion   Palliative care by specialist   DNR (do not resuscitate)   Acute hypoxemic respiratory failure (HCC)   Acute metabolic encephalopathy  Admitted From: home Disposition:  SNF   Recommendations for Outpatient Follow-up:  1. PCP: please follow up with PCP in 1 week and neurology as recommended  2. Continue to engage with the family for goals of care conversation, refer to Palliative care  3. Follow up LABS/TEST:  none   Follow-up Information    Kirby Funk, MD. Schedule an appointment as soon as possible for a visit in 2 week(s).   Specialty: Internal Medicine Contact information: [address] [place] [address] [phone]        Tat, Octaviano Batty, DO. Schedule an appointment as soon as possible for a visit in 4 week(s).   Specialty: Neurology Contact information: [address] [place]  [address] [place] [address] [phone]              Diet recommendation: Dysphagia type 1 HONEY thick Liquid Diet recommendations: Dysphagia 1 (puree);Honey-thick liquid (Pt may have water between meals via Provale cup (controlled flow cup with blue lid in room) following oral care.) Liquids provided via: Cup;Straw Medication Administration: Crushed with puree Supervision: Full supervision/cueing for compensatory strategies Compensations: Slow rate;Small sips/bites;Minimize environmental distractions Postural Changes and/or Swallow Maneuvers: Seated upright 90 degrees  Activity: The patient is advised to  gradually reintroduce usual activities, as tolerated  Discharge Condition: stable  Code Status: DNR   History of present illness: As per the H and P dictated on admission, "Daniel Morrow is a 79 y.o. male with medical history significant of frontotemporal dementia, recurrent UTIs, BPH noncompliant with medications, HTN, IIDM, chronic ambulatory dysfunction, depression anxiety, presented with fever, hypoxia.  Patient nonverbal at baseline minimally responsive, most history provided by patient and wife at bedside.  Wife reported patient has had several episodes of UTI this year after stopped taking his BPH medications.  Each time when UTI happens, patient usually became less responsive.  Baseline mental status wise, patient has had worsening of being responsive to outside stimulus, and recent month his mentation has significantly deteriorated.  For last 4 weeks, patient has been bedbound and wheelchair-bound, unable to transfer himself.  Compared to before he was able to use a walker despite being very wobbly and has sustained multiple falls.  Last night, it appears that patient choked on his food and medications.  And started to have cough, wife noticed patient had a fever 102 and breathing very fast.  Later that night, wife checked patient pulse ox was 88 compared to upper 90s his baseline.  At some point, patient also had a fall and hit the back of the head.  And patient started to became very lethargic and sleepy and less responsive.  Before this, 2 weeks ago patient was in the emergency room for similar episode was diagnosed with UTI and for which patient completed 7 days course of p.o. antibiotics.  Overall, wife reported patient mentation has deteriorated over last  month."  Hospital Course:  79 year old man PMH frontotemporal dementia, as well as being wheelchair and bedbound for the last 4 weeks prior to presentation, presented with fever and hypoxia.  Admitted for acute on chronic metabolic  encephalopathy thought secondary to aspiration pneumonia.  Treated with antibiotics, condition stabilized, evaluated by therapy with recommendation for SNF, patient unable to be cared for at home by wife.  Summary of his active problems in the hospital is as following. 1. Acute on chronic metabolic encephalopathy POA. Chronic encephalopathy in the setting of dementia.  Acute event likely pneumonia plus UTI. Currently encephalopathy is improving. Continue as needed Klonopin, scheduled Seroquel, trazodone and Zoloft. Palliative care was consulted as well. Current goal is DNR, treat what is treatable PT recommends SNF.   2. Acute hypoxic respiratory failure, POA secondary to aspiration pneumonia UTI Currently on room air. Treated with antibiotics. Monitor.  3. Frontoparietal dementia Dysphagia Behavioral disturbances Recurrent fall Continue current medication. Fall precaution. Monitor. Follow-up on speech therapy recommendation  4. Type 2 diabetes mellitus, controlled. No complication. Continue lantus.   5. Fall 12/5 Supraorbital right eye laceration requiring 5 stitches Sutures removed 12/12 Currently improving. Fall precautions.  6. Anxiety  - [place] Controlled Substance Reporting System database was reviewed. - 5 day supply was provided.  7. Essential HTN BP soft to normal, only on low dose lisinopril.  Hold norvasc.   Patient was seen by physical therapy, who recommended SNF,  was arranged. On the day of the discharge the patient's vitals were stable, and no other acute medical condition were reported by patient. The patient was felt safe to be discharge at SNF with therapy.  Consultants: Palliative care  Procedures: none  Discharge Exam: General: Appear in no distress, no Rash; Oral Mucosa Clear, moist. no Abnormal Neck Mass Or lumps, Conjunctiva normal  Cardiovascular: S1 and S2 Present, no Murmur Respiratory: good respiratory effort, Bilateral Air  entry present and CTA, no Crackles, no wheezes Abdomen: Bowel Sound present, Soft and no tenderness Extremities: no Pedal edema Neurology: alert and not oriented to time, place, and person affect flat in affect. no new focal deficit  Filed Weights   08/23/20 0900  Weight: 74.5 kg   Vitals:   09/06/20 0348 09/06/20 0834  BP: 105/61 (!) 90/57  Pulse: (!) 53 86  Resp: 17 18  Temp:  97.7 F (36.5 C)  SpO2: 100% 92%    DISCHARGE MEDICATION: Allergies as of 09/06/2020   No Known Allergies     Medication List    STOP taking these medications   amLODipine 10 MG tablet Commonly known as: NORVASC   benazepril 20 MG tablet Commonly known as: LOTENSIN   dipyridamole-aspirin 200-25 MG 12hr capsule Commonly known as: AGGRENOX   glimepiride 4 MG tablet Commonly known as: AMARYL     TAKE these medications   acetaminophen 325 MG tablet Commonly known as: TYLENOL Take 650 mg by mouth every 6 (six) hours as needed for mild pain, fever or headache.   brimonidine 0.2 % ophthalmic solution Commonly known as: ALPHAGAN Place 1 drop into the right eye 2 (two) times daily.   clonazePAM 0.5 MG tablet Commonly known as: KlonoPIN Take 1 tablet (0.5 mg total) by mouth 2 (two) times daily as needed for anxiety.   colesevelam 625 MG tablet Commonly known as: WELCHOL Take 1,875 mg by mouth 2 (two) times daily with a meal.   insulin aspart 100 UNIT/ML injection Commonly known as: novoLOG Inject 5 Units into the skin  3 (three) times daily with meals.   insulin glargine 100 UNIT/ML injection Commonly known as: LANTUS Inject 0.15 mLs (15 Units total) into the skin at bedtime.   latanoprost 0.005 % ophthalmic solution Commonly known as: XALATAN Place 1 drop into the right eye at bedtime.   lisinopril 2.5 MG tablet Commonly known as: ZESTRIL Take 1 tablet (2.5 mg total) by mouth daily.   memantine 10 MG tablet Commonly known as: NAMENDA Take 1 tablet (10 mg total) by mouth 2  (two) times daily.   Ozempic (1 MG/DOSE) 4 MG/3ML Sopn Generic drug: Semaglutide (1 MG/DOSE) Inject 1 mg into the skin once a week. Thursday   pantoprazole 20 MG tablet Commonly known as: PROTONIX Take 20 mg by mouth 2 (two) times daily.   QUEtiapine 25 MG tablet Commonly known as: SEROQUEL Take 1 tablet (25 mg total) by mouth 3 (three) times daily.   Resource ThickenUp Clear Powd Use as needed   sertraline 100 MG tablet Commonly known as: ZOLOFT Take 1.5 tablets (150 mg total) by mouth daily.   tamsulosin 0.4 MG Caps capsule Commonly known as: FLOMAX Take 1 capsule (0.4 mg total) by mouth daily after supper.   timolol 0.5 % ophthalmic solution Commonly known as: TIMOPTIC Place 1 drop into the right eye 2 (two) times daily.   traZODone 100 MG tablet Commonly known as: DESYREL Take 100 mg by mouth at bedtime.   vitamin B-12 1000 MCG tablet Commonly known as: CYANOCOBALAMIN Take 1,000 mcg by mouth daily.      No Known Allergies Discharge Instructions    Ambulatory referral to Neurology   Complete by: As directed    An appointment is requested in approximately: 4 weeks   DIET - DYS 1   Complete by: As directed    Fluid consistency: Honey Thick   Increase activity slowly   Complete by: As directed    No wound care   Complete by: As directed    No wound care   Complete by: As directed       The results of significant diagnostics from this hospitalization (including imaging, microbiology, ancillary and laboratory) are listed below for reference.    Significant Diagnostic Studies: DG Shoulder 1V Right  Result Date: 08/26/2020 CLINICAL DATA:  Pain EXAM: RIGHT SHOULDER - 1 VIEW; RIGHT HUMERUS - 2+ VIEW COMPARISON:  None. FINDINGS: There is no evidence of fracture or dislocation. There is no evidence of arthropathy or other focal bone abnormality. Soft tissues are unremarkable. IMPRESSION: No acute displaced fracture or dislocation. Evaluation is limited by single  view technique of the right shoulder. Electronically Signed   By: Katherine Mantle M.D.   On: 08/26/2020 21:52   CT HEAD WO CONTRAST  Result Date: 08/26/2020 CLINICAL DATA:  Facial trauma. Additional history provided: Fall on unit, laceration to forehead. EXAM: CT HEAD WITHOUT CONTRAST TECHNIQUE: Contiguous axial images were obtained from the base of the skull through the vertex without intravenous contrast. COMPARISON:  Prior head CT examinations 08/22/2020 and earlier. FINDINGS: Brain: Mild cerebral and cerebellar atrophy. Ill-defined hypoattenuation within the cerebral white matter is nonspecific, but compatible chronic small vessel ischemic disease. Redemonstrated small chronic cerebellar infarcts. There is no acute intracranial hemorrhage. No demarcated cortical infarct. No extra-axial fluid collection. No midline shift. Vascular: No hyperdense vessel.  Atherosclerotic calcifications. Skull: No calvarial fracture. Redemonstrated chronic fractures of the left zygomatic arch. Sinuses/Orbits: Visualized orbits show no acute finding. Chronic left globe findings suggestive of vitreous detachment (series 3, image  10). Mild scattered paranasal sinus mucosal thickening. Small bilateral maxillary sinus mucous retention cysts. Other: Right forehead soft tissue swelling. IMPRESSION: No evidence of acute intracranial abnormality. Right forehead soft tissue swelling. Stable parenchymal atrophy and chronic small vessel ischemic disease. Redemonstrated small chronic cerebellar infarcts. Electronically Signed   By: Jackey Loge DO   On: 08/26/2020 18:21   CT Head Wo Contrast  Result Date: 08/22/2020 CLINICAL DATA:  Mental status change EXAM: CT HEAD WITHOUT CONTRAST TECHNIQUE: Contiguous axial images were obtained from the base of the skull through the vertex without intravenous contrast. COMPARISON:  08/07/2020 FINDINGS: Motion artifact is present on several slices. Brain: There is no acute intracranial hemorrhage,  mass effect, or edema. Gray-white differentiation is preserved within the above limitation. There is no extra-axial fluid collection. Patchy hypoattenuation in the supratentorial white matter is nonspecific but likely reflects stable chronic microvascular ischemic changes. Small chronic right cerebellar infarct. Ventricles and sulci are stable in size and configuration. Vascular: There is atherosclerotic calcification at the skull base. Skull: Calvarium is unremarkable. Sinuses/Orbits: No acute finding.  Stable appearance of the orbits. Other: None. IMPRESSION: Motion degraded study. No acute intracranial abnormality or significant change since recent prior study. Electronically Signed   By: Guadlupe Spanish M.D.   On: 08/22/2020 14:00   CT Head Wo Contrast  Result Date: 08/07/2020 CLINICAL DATA:  Delirium EXAM: CT HEAD WITHOUT CONTRAST TECHNIQUE: Contiguous axial images were obtained from the base of the skull through the vertex without intravenous contrast. COMPARISON:  July 2020 FINDINGS: Brain: There is no acute intracranial hemorrhage, mass effect, or edema. Gray-white differentiation is preserved. There is no extra-axial fluid collection. Prominence of the ventricles and sulci reflects stable generalized parenchymal volume loss. Patchy and confluent areas of hypoattenuation in the supratentorial white matter are nonspecific but may reflect moderate chronic microvascular ischemic changes. Small chronic right cerebellar infarct. Vascular: There is atherosclerotic calcification at the skull base. Skull: Calvarium is unremarkable. Sinuses/Orbits: No acute finding. Other: None. IMPRESSION: No acute intracranial abnormality. Stable chronic findings detailed above. Electronically Signed   By: Guadlupe Spanish M.D.   On: 08/07/2020 15:03   US RENAL  Result Date: 08/22/2020 CLINICAL DATA:  Urinary tract infection EXAM: RENAL / URINARY TRACT ULTRASOUND COMPLETE COMPARISON:  Renal ultrasound October 22, 2011  FINDINGS: Right Kidney: Renal measurements: 13.4 x 5.3 x 5.1 cm = volume: 188 mL. Echogenicity within normal limits. No mass or hydronephrosis visualized. Left Kidney: Renal measurements: 13.6 x 6.5 x 6.2 cm = volume: 286 mL. Echogenicity within normal limits. No mass or hydronephrosis visualized. Bladder: Appears normal for degree of bladder distention. Other: Prominent prostate measuring 5.5 x 4.8 x 4.5 cm (volume = 62 cm^3) IMPRESSION: No significant sonographic abnormality of the kidneys or bladder. Electronically Signed   By: Maudry Mayhew MD   On: 08/22/2020 16:38   DG Chest Port 1 View  Result Date: 08/22/2020 CLINICAL DATA:  Fever and altered level of consciousness. EXAM: PORTABLE CHEST 1 VIEW COMPARISON:  Single-view of the chest 08/07/2020 and PA and lateral chest 12/21/2017. FINDINGS: Very mild bibasilar atelectasis process, pneumothorax. No consolidative or effusion. Lung volumes are somewhat low. Heart size is normal. No acute or focal bony abnormality. IMPRESSION: No acute disease. Electronically Signed   By: Drusilla Kanner M.D.   On: 08/22/2020 11:59   DG Chest Port 1 View  Result Date: 08/07/2020 CLINICAL DATA:  Possible sepsis.  Evaluate for pneumonia. EXAM: PORTABLE CHEST 1 VIEW COMPARISON:  10/23/2019 FINDINGS: Numerous leads  and wires project over the chest. Patient rotated right. Midline trachea. Normal heart size. Apparent right paratracheal soft tissue fullness has been similar including back to 12/21/2017, favored to be related to AP portable technique and great vessels. Trace left costophrenic angle blunting is likely due to pleural thickening. Mild bibasilar scarring. No lobar consolidation. IMPRESSION: No acute cardiopulmonary disease. Electronically Signed   By: Jeronimo Greaves M.D.   On: 08/07/2020 13:26   DG Humerus Right  Result Date: 08/26/2020 CLINICAL DATA:  Pain EXAM: RIGHT SHOULDER - 1 VIEW; RIGHT HUMERUS - 2+ VIEW COMPARISON:  None. FINDINGS: There is no evidence of  fracture or dislocation. There is no evidence of arthropathy or other focal bone abnormality. Soft tissues are unremarkable. IMPRESSION: No acute displaced fracture or dislocation. Evaluation is limited by single view technique of the right shoulder. Electronically Signed   By: Katherine Mantle M.D.   On: 08/26/2020 21:52   DG Swallowing Func-Speech Pathology  Result Date: 08/31/2020 Objective Swallowing Evaluation: Type of Study: MBS-Modified Barium Swallow Study  Patient Details Name: Daniel Morrow MRN: [id number] Date of Birth: 09-05-1941 Today's Date: 08/31/2020 Time: SLP Start Time (ACUTE ONLY): 1000 -SLP Stop Time (ACUTE ONLY): 1020 SLP Time Calculation (min) (ACUTE ONLY): 20 min Past Medical History: Past Medical History: Diagnosis Date . Aphagia  . Arthritis   "knees" (09/14/2018) . Blind left eye  . Chronic bronchitis (HCC)  . Dementia (HCC)  . Depression  . GERD (gastroesophageal reflux disease)  . Glaucoma  . History of kidney stones  . Hyperlipidemia  . Hypertension  . Pneumonia   "maybe twice" (09/14/2018) . Recurrent UTI (urinary tract infection)  . Stroke [facility]) 11/2011  "didn't effect him at the time" (09/14/2018) . Type II diabetes mellitus (HCC)  Past Surgical History: Past Surgical History: Procedure Laterality Date . ABDOMINAL HERNIA REPAIR   . CATARACT EXTRACTION W/ INTRAOCULAR LENS  IMPLANT, BILATERAL Bilateral  . COLONOSCOPY   . ESOPHAGOGASTRODUODENOSCOPY N/A 01/11/2019  Procedure: ESOPHAGOGASTRODUODENOSCOPY (EGD);  Surgeon: Meryl Dare, MD;  Location[facility] ENDOSCOPY;  Service: Endoscopy;  Laterality: N/A; . ESOPHAGOGASTRODUODENOSCOPY Left 10/04/2019  Procedure: ESOPHAGOGASTRODUODENOSCOPY (EGD);  Surgeon: Shellia Cleverly, DO;  Location: [facility] ENDOSCOPY;  Service: Gastroenterology;  Laterality: Left; . ESOPHAGOGASTRODUODENOSCOPY (EGD) WITH ESOPHAGEAL DILATION  X 2 . FOREIGN BODY REMOVAL  01/11/2019 . FRACTURE SURGERY   . HERNIA REPAIR   . IMPACTION REMOVAL  10/04/2019  Procedure: IMPACTION  REMOVAL;  Surgeon: Shellia Cleverly, DO;  Location: [facility] ENDOSCOPY;  Service: Gastroenterology;; . PATELLA FRACTURE SURGERY Left  . POSTERIOR CERVICAL FUSION/FORAMINOTOMY   HPI: Pt is a 79 y.o. male with medical history significant of frontotemporal dementia, recurrent UTIs, BPH noncompliant with medications, HTN, IIDM, chronic ambulatory dysfunction, depression anxiety, who presented with fever, hypoxia, and AMS. Per H&P, pt nonverbal at baseline and minimally responsive. Note from ED MD stated "He has history of prior stroke and is minimally verbal, however has been less vocal over the past 2 days." CT head: No acute intracranial abnormality. CXR was negative for acute changes. Pt found to have UTI, and acute on chronic encephalopathy.  No data recorded Assessment / Plan / Recommendation CHL IP CLINICAL IMPRESSIONS 08/31/2020 Clinical Impression Pt presents with oropharyngeal dysphagia characterized by impaired mastication, reduced bolus cohesion, and a pharyngeal delay. Mastication was moderately prolonged with dysphagia 3 solids. He exhibited premature spillage to the valleculae and pyriform sinuses. Penetration (PAS 3) and aspiration (PAS 8) were noted with nectar thick liquids and thin liquids secondary to the  pharyngeal delay. Most instances of aspiration were silent but delayed throat clearing was occasionally noted following repeated episodes of aspiration. Immediate aspiration was reduced/eliminated with reduced bolus sizes, but penetration persisted and aspiration was ultimately noted. Postural modifications were attempted, but pt was unable to follow the necessary commands for completion of them. A dysphagia 1 diet with honey thick liquids is recommended at this time. SLP will follow for dysphagia treatment. SLP Visit Diagnosis Dysphagia, unspecified (R13.10) Attention and concentration deficit following -- Frontal lobe and executive function deficit following -- Impact on safety and function Moderate  aspiration risk   CHL IP TREATMENT RECOMMENDATION 08/31/2020 Treatment Recommendations Therapy as outlined in treatment plan below   Prognosis 08/31/2020 Prognosis for Safe Diet Advancement Good Barriers to Reach Goals Cognitive deficits;Severity of deficits Barriers/Prognosis Comment -- CHL IP DIET RECOMMENDATION 08/31/2020 SLP Diet Recommendations Dysphagia 1 (Puree) solids;Honey thick liquids Liquid Administration via Cup;Straw Medication Administration Crushed with puree Compensations Slow rate;Small sips/bites;Minimize environmental distractions Postural Changes Remain semi-upright after after feeds/meals (Comment);Seated upright at 90 degrees   CHL IP OTHER RECOMMENDATIONS 08/31/2020 Recommended Consults -- Oral Care Recommendations Oral care BID Other Recommendations --   CHL IP FOLLOW UP RECOMMENDATIONS 08/31/2020 Follow [facility] D[facility] 08/31/2020 Speech Therapy Frequency (ACUTE ONLY) min 2x/week Treatment Duration 2 weeks      CHL IP ORAL PHASE 08/31/2020 Oral Phase Impaired Oral - Pudding Teaspoon -- Oral - Pudding Cup -- Oral - Honey Teaspoon -- Oral - Honey Cup Decreased bolus cohesion Oral - Nectar Teaspoon Decreased bolus cohesion Oral - Nectar Cup Decreased bolus cohesion Oral - Nectar Straw Decreased bolus cohesion;Premature spillage Oral - Thin Teaspoon -- Oral - Thin Cup Decreased bolus cohesion;Premature spillage Oral - Thin Straw Decreased bolus cohesion;Premature spillage Oral - Puree Decreased bolus cohesion;Premature spillage Oral - Mech Soft Impaired mastication Oral - Regular -- Oral - Multi-Consistency -- Oral - Pill -- Oral Phase - Comment --  CHL IP PHARYNGEAL PHASE 08/31/2020 Pharyngeal Phase Impaired Pharyngeal- Pudding Teaspoon -- Pharyngeal -- Pharyngeal- Pudding Cup -- Pharyngeal -- Pharyngeal- Honey Teaspoon Delayed swallow initiation-pyriform sinuses Pharyngeal -- Pharyngeal- Honey Cup Delayed swallow initiation-pyriform  sinuses Pharyngeal -- Pharyngeal- Nectar Teaspoon -- Pharyngeal -- Pharyngeal- Nectar Cup Delayed swallow initiation-pyriform sinuses;Penetration/Aspiration during swallow;Penetration/Apiration after swallow Pharyngeal Material enters airway, remains ABOVE vocal cords and not ejected out;Material enters airway, passes BELOW cords without attempt by patient to eject out (silent aspiration) Pharyngeal- Nectar Straw Delayed swallow initiation-pyriform sinuses;Penetration/Aspiration during swallow;Penetration/Apiration after swallow Pharyngeal Material enters airway, passes BELOW cords without attempt by patient to eject out (silent aspiration) Pharyngeal- Thin Teaspoon -- Pharyngeal -- Pharyngeal- Thin Cup Delayed swallow initiation-pyriform sinuses;Penetration/Aspiration during swallow;Penetration/Apiration after swallow Pharyngeal Material enters airway, passes BELOW cords without attempt by patient to eject out (silent aspiration);Material enters airway, remains ABOVE vocal cords and not ejected out Pharyngeal- Thin Straw Delayed swallow initiation-pyriform sinuses;Penetration/Aspiration during swallow;Penetration/Apiration after swallow Pharyngeal Material enters airway, passes BELOW cords without attempt by patient to eject out (silent aspiration) Pharyngeal- Puree Delayed swallow initiation-pyriform sinuses Pharyngeal -- Pharyngeal- Mechanical Soft -- Pharyngeal -- Pharyngeal- Regular -- Pharyngeal -- Pharyngeal- Multi-consistency -- Pharyngeal -- Pharyngeal- Pill -- Pharyngeal -- Pharyngeal Comment --  CHL IP CERVICAL ESOPHAGEAL PHASE 08/31/2020 Cervical Esophageal Phase WFL Pudding Teaspoon -- Pudding Cup -- Honey Teaspoon -- Honey Cup -- Nectar Teaspoon -- Nectar Cup -- Nectar Straw -- Thin Teaspoon -- Thin Cup -- Thin Straw -- Puree -- Mechanical Soft -- Regular -- Multi-consistency --  Pill -- Cervical Esophageal Comment -- Yvone Neu I. Vear Clock, MS, CCC-SLP Acute Rehabilitation Services Office number  [phone] Pager [phone] Scheryl Marten 08/31/2020, 10:56 AM               Microbiology: Recent Results (from the past 240 hour(s))  Resp Panel by RT-PCR (Flu A&B, Covid) Nasopharyngeal Swab     Status: None   Collection Time: 08/29/20  9:11 AM   Specimen: Nasopharyngeal Swab; Nasopharyngeal(NP) swabs in vial transport medium  Result Value Ref Range Status   SARS Coronavirus 2 by RT PCR NEGATIVE NEGATIVE Final    Comment: (NOTE) SARS-CoV-2 target nucleic acids are NOT DETECTED.  The SARS-CoV-2 RNA is generally detectable in upper respiratory specimens during the acute phase of infection. The lowest concentration of SARS-CoV-2 viral copies this assay can detect is 138 copies/mL. A negative result does not preclude SARS-Cov-2 infection and should not be used as the sole basis for treatment or other patient management decisions. A negative result may occur with  improper specimen collection/handling, submission of specimen other than nasopharyngeal swab, presence of viral mutation(s) within the areas targeted by this assay, and inadequate number of viral copies(<138 copies/mL). A negative result must be combined with clinical observations, patient history, and epidemiological information. The expected result is Negative.  Fact Sheet for Patients:  BloggerCourse.com  Fact Sheet for Healthcare Providers:  SeriousBroker.it  This test is no t yet approved or cleared by the [place] FDA and  has been authorized for detection and/or diagnosis of SARS-CoV-2 by FDA under an Emergency Use Authorization (EUA). This EUA will remain  in effect (meaning this test can be used) for the duration of the COVID-19 declaration under Section 564(b)(1) of the Act, 21 U.S.C.section 360bbb-3(b)(1), unless the authorization is terminated  or revoked sooner.       Influenza A by PCR NEGATIVE NEGATIVE Final   Influenza B by PCR NEGATIVE  NEGATIVE Final    Comment: (NOTE) The Xpert Xpress SARS-CoV-2/FLU/RSV plus assay is intended as an aid in the diagnosis of influenza from Nasopharyngeal swab specimens and should not be used as a sole basis for treatment. Nasal washings and aspirates are unacceptable for Xpert Xpress SARS-CoV-2/FLU/RSV testing.  Fact Sheet for Patients: BloggerCourse.com  Fact Sheet for Healthcare Providers: SeriousBroker.it  This test is not yet approved or cleared by the [place] FDA and has been authorized for detection and/or diagnosis of SARS-CoV-2 by FDA under an Emergency Use Authorization (EUA). This EUA will remain in effect (meaning this test can be used) for the duration of the COVID-19 declaration under Section 564(b)(1) of the Act, 21 U.S.C. section 360bbb-3(b)(1), unless the authorization is terminated or revoked.  Performed at [facility], [address] [place], [place] [address]   Resp Panel by RT-PCR (Flu A&B, Covid) Nasopharyngeal Swab     Status: None   Collection Time: 09/03/20  5:34 PM   Specimen: Nasopharyngeal Swab; Nasopharyngeal(NP) swabs in vial transport medium  Result Value Ref Range Status   SARS Coronavirus 2 by RT PCR NEGATIVE NEGATIVE Final    Comment: (NOTE) SARS-CoV-2 target nucleic acids are NOT DETECTED.  The SARS-CoV-2 RNA is generally detectable in upper respiratory specimens during the acute phase of infection. The lowest concentration of SARS-CoV-2 viral copies this assay can detect is 138 copies/mL. A negative result does not preclude SARS-Cov-2 infection and should not be used as the sole basis for treatment or other patient management decisions. A negative result may occur with  improper specimen collection/handling, submission of specimen other than nasopharyngeal swab, presence of viral mutation(s) within the areas targeted by this assay, and inadequate number of viral copies(<138  copies/mL). A negative result must be combined with clinical observations, patient history, and epidemiological information. The expected result is Negative.  Fact Sheet for Patients:  BloggerCourse.com  Fact Sheet for Healthcare Providers:  SeriousBroker.it  This test is no t yet approved or cleared by the [place] FDA and  has been authorized for detection and/or diagnosis of SARS-CoV-2 by FDA under an Emergency Use Authorization (EUA). This EUA will remain  in effect (meaning this test can be used) for the duration of the COVID-19 declaration under Section 564(b)(1) of the Act, 21 U.S.C.section 360bbb-3(b)(1), unless the authorization is terminated  or revoked sooner.       Influenza A by PCR NEGATIVE NEGATIVE Final   Influenza B by PCR NEGATIVE NEGATIVE Final    Comment: (NOTE) The Xpert Xpress SARS-CoV-2/FLU/RSV plus assay is intended as an aid in the diagnosis of influenza from Nasopharyngeal swab specimens and should not be used as a sole basis for treatment. Nasal washings and aspirates are unacceptable for Xpert Xpress SARS-CoV-2/FLU/RSV testing.  Fact Sheet for Patients: BloggerCourse.com  Fact Sheet for Healthcare Providers: SeriousBroker.it  This test is not yet approved or cleared by the [place] FDA and has been authorized for detection and/or diagnosis of SARS-CoV-2 by FDA under an Emergency Use Authorization (EUA). This EUA will remain in effect (meaning this test can be used) for the duration of the COVID-19 declaration under Section 564(b)(1) of the Act, 21 U.S.C. section 360bbb-3(b)(1), unless the authorization is terminated or revoked.  Performed at [facility], [address] [place], [place] [address]      Labs: CBC: No results for input(s): WBC, NEUTROABS, HGB, HCT, MCV, PLT in the last 168 hours. Basic Metabolic Panel: No  results for input(s): NA, K, CL, CO2, GLUCOSE, BUN, CREATININE, CALCIUM, MG, PHOS in the last 168 hours. Liver Function Tests: No results for input(s): AST, ALT, ALKPHOS, BILITOT, PROT, ALBUMIN in the last 168 hours. CBG: Recent Labs  Lab 09/05/20 0611 09/05/20 1239 09/05/20 1626 09/05/20 2108 09/06/20 0648  GLUCAP 96 148* 217* 171* 122*    Time spent: 35 minutes  Signed:  Lynden Oxford  [facility]  09/06/2020 11:32 AM

## 2020-09-06 NOTE — Progress Notes (Signed)
  Speech Language Pathology Treatment: Dysphagia  Patient Details Name: Daniel Morrow MRN: [id number] DOB: 1940/10/07 Today's Date: 09/06/2020 Time: 7622-6333 SLP Time Calculation (min) (ACUTE ONLY): 10 min  Assessment / Plan / Recommendation Clinical Impression  Pt was seen for dysphagia treatment. He was lethargic during the session despite repositioning and verbal/tactile stimulation. Pt did not open his eyes and trials were limited for these reasons. He tolerated a single teaspoon of nectar thick liquids without overt s/sx of aspiration, but the amount liquid swallowed is questioned due to the amount of anterior spillage noted. Additional boluses were deferred due to his inability to participate adequately. It is therefore recommended that the current diet of dysphagia 1 with honey thick liquids and thin water via 5cc Provale cup be continued. SLP will continue to follow pt. Pt currently has discharge orders but is waiting for insurance approval for SNF. Continued SLP services are recommended following discharge and a repeat modified barium swallow study to assess improvement in swallow function and his ability to tolerate more advanced consistencies.    HPI HPI: Pt is a 79 y.o. male with medical history significant of frontotemporal dementia, recurrent UTIs, BPH noncompliant with medications, HTN, IIDM, chronic ambulatory dysfunction, depression anxiety, who presented with fever, hypoxia, and AMS. Per H&P, pt nonverbal at baseline and minimally responsive. Note from ED MD stated "He has history of prior stroke and is minimally verbal, however has been less vocal over the past 2 days." CT head: No acute intracranial abnormality. CXR was negative for acute changes. Pt found to have UTI, and acute on chronic encephalopathy.      SLP Plan  Continue with current plan of care       Recommendations  Diet recommendations: Dysphagia 1 (puree);Honey-thick liquid Liquids provided via:  Cup;Straw Medication Administration: Crushed with puree Supervision: Full supervision/cueing for compensatory strategies Compensations: Slow rate;Small sips/bites;Minimize environmental distractions Postural Changes and/or Swallow Maneuvers: Seated upright 90 degrees                Oral Care Recommendations: Oral care BID Follow up Recommendations: Skilled Nursing facility SLP Visit Diagnosis: Dysphagia, unspecified (R13.10) Plan: Continue with current plan of care       Isabella Roemmich I. Vear Clock, MS, CCC-SLP Acute Rehabilitation Services Office number [phone] Pager [phone]                Scheryl Marten 09/06/2020, 9:12 AM

## 2020-09-06 NOTE — Progress Notes (Addendum)
Report called to [place], Charity fundraiser. Transport is here to take patient to [facility].

## 2020-09-06 NOTE — Progress Notes (Signed)
Attempted to call report to facility was unable to reach nurse. Will call again.

## 2020-09-06 NOTE — TOC Transition Note (Signed)
Transition of Care [facility]) - CM/SW Discharge Note   Patient Details  Name: Daniel Morrow MRN: [id number] Date of Birth: 1941/09/14  Transition of Care [facility]) CM/SW Contact:  Kermit Balo, RN Phone Number: 09/06/2020, 1:38 PM   Clinical Narrative:    Pt discharging to [facility] today. Wife at the bedside and aware. PTAR arranged and d/c packet at the desk. Bedside RN updated.   Room: 404A Number for report: [phone]   Final next level of care: Skilled Nursing Facility Barriers to Discharge: No Barriers Identified   Patient Goals and CMS Choice Patient states their goals for this hospitalization and ongoing recovery are:: Pt's wife interested in SNF placement to see how pt progresses in rehab. CMS Medicare.gov Compare Post Acute Care list provided to:: Patient Represenative (must comment) Choice offered to / list presented to : Spouse  Discharge Placement              Patient chooses bed at: [facility] Patient to be transferred to facility by: PTAR Name of family member notified: wife at the bedside Patient and family notified of of transfer: 09/06/20  Discharge Plan and Services     Post Acute Care Choice: Skilled Nursing Facility                               Social Determinants of Health (SDOH) Interventions     Readmission Risk Interventions No flowsheet data found.

## 2020-09-06 NOTE — TOC Progression Note (Signed)
Transition of Care [facility]) - Progression Note    Patient Details  Name: TEJAY HUBERT MRN: [id number] Date of Birth: 1940/12/11  Transition of Care [facility]) CM/SW Contact  Lorri Frederick, LCSW Phone Number: 09/06/2020, 10:38 AM  Clinical Narrative:   PHone call from [place] at [facility].  Insurance auth received for this pt and they are ready to take him today.  Covering LCSW notified.     Expected Discharge Plan: Skilled Nursing Facility Barriers to Discharge: Continued Medical Work up,Insurance Authorization,SNF Pending bed offer  Expected Discharge Plan and Services Expected Discharge Plan: Skilled Nursing Facility     Post Acute Care Choice: Skilled Nursing Facility Living arrangements for the past 2 months: Single Family Home Expected Discharge Date: 09/03/20                                     Social Determinants of Health (SDOH) Interventions    Readmission Risk Interventions No flowsheet data found.

## 2020-09-07 NOTE — Progress Notes (Signed)
   Discharge Summary: [facility]  [facility] is the home-based palliative care program of [facility]  Discharge Reason: Transfer to SNF Admission Date: 11/18/18 Discharge Date: 09/06/2020  Summary of [facility] Services:  Mr. Bossier was admitted to [facility] with a diagnosis of Frontotemporal Dementia with progressive aphasia Other medical history includes recurrent depressive disorder, GERD, DM type II, CKD, Urge Incontinence, BPH, CVA in 2019 with no residual deficit Nursing and SW services were provided including goals of care discussions, extensive teaching to wife regarding techniques to use as patient cognitive function declined and safety measures, assessment of medical conditions, discussion of available resources, medication teaching, coordination of care with medical providers and supportive counseling. Skilled nurse assisted in collection of lab specimens per MD orders. As patient's condition declined, extensive teaching re: continued aggressive care vs. comfort focused care.  Goals of Care: Prior to hospitalization on 08/22/2020, wife's goal had been for patient to remain in home. Due to progressive functional decline, wife elected for SNF placement after hospitalization.  Advance Directives: HCPOA and MOST form  If Mr. Ruffini returns home in the future, [facility] would be able to re-enroll with MD order. Lamont Snowball, RN, MSN, [facility]

## 2020-09-11 DIAGNOSIS — E119 Type 2 diabetes mellitus without complications: Secondary | ICD-10-CM | POA: Diagnosis not present

## 2020-09-11 DIAGNOSIS — R4182 Altered mental status, unspecified: Secondary | ICD-10-CM | POA: Diagnosis not present

## 2020-09-11 DIAGNOSIS — G3109 Other frontotemporal dementia: Secondary | ICD-10-CM | POA: Diagnosis not present

## 2020-09-11 DIAGNOSIS — J698 Pneumonitis due to inhalation of other solids and liquids: Secondary | ICD-10-CM | POA: Diagnosis not present

## 2020-09-24 DIAGNOSIS — Z03818 Encounter for observation for suspected exposure to other biological agents ruled out: Secondary | ICD-10-CM | POA: Diagnosis not present

## 2020-09-24 DIAGNOSIS — Z20822 Contact with and (suspected) exposure to covid-19: Secondary | ICD-10-CM | POA: Diagnosis not present

## 2020-10-16 DIAGNOSIS — R4182 Altered mental status, unspecified: Secondary | ICD-10-CM | POA: Diagnosis not present

## 2020-10-16 DIAGNOSIS — G3109 Other frontotemporal dementia: Secondary | ICD-10-CM | POA: Diagnosis not present

## 2020-10-16 DIAGNOSIS — E119 Type 2 diabetes mellitus without complications: Secondary | ICD-10-CM | POA: Diagnosis not present

## 2020-10-18 DIAGNOSIS — Z20822 Contact with and (suspected) exposure to covid-19: Secondary | ICD-10-CM | POA: Diagnosis not present

## 2020-10-18 DIAGNOSIS — Z03818 Encounter for observation for suspected exposure to other biological agents ruled out: Secondary | ICD-10-CM | POA: Diagnosis not present

## 2020-10-19 DIAGNOSIS — G9341 Metabolic encephalopathy: Secondary | ICD-10-CM | POA: Diagnosis not present

## 2020-10-19 DIAGNOSIS — R509 Fever, unspecified: Secondary | ICD-10-CM | POA: Diagnosis not present

## 2020-10-23 DIAGNOSIS — R0902 Hypoxemia: Secondary | ICD-10-CM | POA: Diagnosis not present

## 2020-10-23 DIAGNOSIS — M2550 Pain in unspecified joint: Secondary | ICD-10-CM | POA: Diagnosis not present

## 2020-10-23 DIAGNOSIS — R531 Weakness: Secondary | ICD-10-CM | POA: Diagnosis not present

## 2020-10-23 DIAGNOSIS — Z7401 Bed confinement status: Secondary | ICD-10-CM | POA: Diagnosis not present

## 2020-10-23 DIAGNOSIS — Z743 Need for continuous supervision: Secondary | ICD-10-CM | POA: Diagnosis not present

## 2020-10-24 DIAGNOSIS — E785 Hyperlipidemia, unspecified: Secondary | ICD-10-CM | POA: Diagnosis not present

## 2020-10-24 DIAGNOSIS — E1165 Type 2 diabetes mellitus with hyperglycemia: Secondary | ICD-10-CM | POA: Diagnosis not present

## 2020-10-24 DIAGNOSIS — D649 Anemia, unspecified: Secondary | ICD-10-CM | POA: Diagnosis not present

## 2020-10-24 DIAGNOSIS — R69 Illness, unspecified: Secondary | ICD-10-CM | POA: Diagnosis not present

## 2020-10-24 DIAGNOSIS — I10 Essential (primary) hypertension: Secondary | ICD-10-CM | POA: Diagnosis not present

## 2020-10-26 DIAGNOSIS — N3000 Acute cystitis without hematuria: Secondary | ICD-10-CM | POA: Diagnosis not present

## 2020-10-26 DIAGNOSIS — R69 Illness, unspecified: Secondary | ICD-10-CM | POA: Diagnosis not present

## 2020-10-26 DIAGNOSIS — I10 Essential (primary) hypertension: Secondary | ICD-10-CM | POA: Diagnosis not present

## 2020-10-26 DIAGNOSIS — Z794 Long term (current) use of insulin: Secondary | ICD-10-CM | POA: Diagnosis not present

## 2020-10-26 DIAGNOSIS — G3109 Other frontotemporal dementia: Secondary | ICD-10-CM | POA: Diagnosis not present

## 2020-10-26 DIAGNOSIS — E1159 Type 2 diabetes mellitus with other circulatory complications: Secondary | ICD-10-CM | POA: Diagnosis not present

## 2020-10-29 DIAGNOSIS — R69 Illness, unspecified: Secondary | ICD-10-CM | POA: Diagnosis not present

## 2020-10-29 DIAGNOSIS — G9341 Metabolic encephalopathy: Secondary | ICD-10-CM | POA: Diagnosis not present

## 2020-10-29 DIAGNOSIS — Z9181 History of falling: Secondary | ICD-10-CM | POA: Diagnosis not present

## 2020-10-29 DIAGNOSIS — R2681 Unsteadiness on feet: Secondary | ICD-10-CM | POA: Diagnosis not present

## 2020-10-29 DIAGNOSIS — G3109 Other frontotemporal dementia: Secondary | ICD-10-CM | POA: Diagnosis not present

## 2020-10-29 DIAGNOSIS — G309 Alzheimer's disease, unspecified: Secondary | ICD-10-CM | POA: Diagnosis not present

## 2020-11-03 DIAGNOSIS — R0989 Other specified symptoms and signs involving the circulatory and respiratory systems: Secondary | ICD-10-CM | POA: Diagnosis not present

## 2020-11-06 DIAGNOSIS — F411 Generalized anxiety disorder: Secondary | ICD-10-CM | POA: Diagnosis not present

## 2020-11-06 DIAGNOSIS — G4700 Insomnia, unspecified: Secondary | ICD-10-CM | POA: Diagnosis not present

## 2020-11-06 DIAGNOSIS — R69 Illness, unspecified: Secondary | ICD-10-CM | POA: Diagnosis not present

## 2020-11-15 DIAGNOSIS — G3109 Other frontotemporal dementia: Secondary | ICD-10-CM | POA: Diagnosis not present

## 2020-11-15 DIAGNOSIS — G309 Alzheimer's disease, unspecified: Secondary | ICD-10-CM | POA: Diagnosis not present

## 2020-11-15 DIAGNOSIS — G9341 Metabolic encephalopathy: Secondary | ICD-10-CM | POA: Diagnosis not present

## 2020-11-15 DIAGNOSIS — R69 Illness, unspecified: Secondary | ICD-10-CM | POA: Diagnosis not present

## 2020-11-15 DIAGNOSIS — Z9181 History of falling: Secondary | ICD-10-CM | POA: Diagnosis not present

## 2020-11-15 DIAGNOSIS — R2681 Unsteadiness on feet: Secondary | ICD-10-CM | POA: Diagnosis not present

## 2020-11-20 DIAGNOSIS — G3109 Other frontotemporal dementia: Secondary | ICD-10-CM | POA: Diagnosis not present

## 2020-11-20 DIAGNOSIS — R69 Illness, unspecified: Secondary | ICD-10-CM | POA: Diagnosis not present

## 2020-11-20 DIAGNOSIS — Z9181 History of falling: Secondary | ICD-10-CM | POA: Diagnosis not present

## 2020-11-20 DIAGNOSIS — G4700 Insomnia, unspecified: Secondary | ICD-10-CM | POA: Diagnosis not present

## 2020-11-20 DIAGNOSIS — R278 Other lack of coordination: Secondary | ICD-10-CM | POA: Diagnosis not present

## 2020-11-20 DIAGNOSIS — G300 Alzheimer's disease with early onset: Secondary | ICD-10-CM | POA: Diagnosis not present

## 2020-11-20 DIAGNOSIS — F411 Generalized anxiety disorder: Secondary | ICD-10-CM | POA: Diagnosis not present

## 2020-11-21 DIAGNOSIS — G300 Alzheimer's disease with early onset: Secondary | ICD-10-CM | POA: Diagnosis not present

## 2020-11-21 DIAGNOSIS — Z9181 History of falling: Secondary | ICD-10-CM | POA: Diagnosis not present

## 2020-11-21 DIAGNOSIS — R278 Other lack of coordination: Secondary | ICD-10-CM | POA: Diagnosis not present

## 2020-11-21 DIAGNOSIS — G3109 Other frontotemporal dementia: Secondary | ICD-10-CM | POA: Diagnosis not present

## 2020-11-21 DIAGNOSIS — R69 Illness, unspecified: Secondary | ICD-10-CM | POA: Diagnosis not present

## 2020-11-22 DIAGNOSIS — R69 Illness, unspecified: Secondary | ICD-10-CM | POA: Diagnosis not present

## 2020-11-22 DIAGNOSIS — R278 Other lack of coordination: Secondary | ICD-10-CM | POA: Diagnosis not present

## 2020-11-22 DIAGNOSIS — G300 Alzheimer's disease with early onset: Secondary | ICD-10-CM | POA: Diagnosis not present

## 2020-11-22 DIAGNOSIS — G3109 Other frontotemporal dementia: Secondary | ICD-10-CM | POA: Diagnosis not present

## 2020-11-22 DIAGNOSIS — Z9181 History of falling: Secondary | ICD-10-CM | POA: Diagnosis not present

## 2020-11-27 DIAGNOSIS — R69 Illness, unspecified: Secondary | ICD-10-CM | POA: Diagnosis not present

## 2020-11-27 DIAGNOSIS — R059 Cough, unspecified: Secondary | ICD-10-CM | POA: Diagnosis not present

## 2020-11-27 DIAGNOSIS — R5383 Other fatigue: Secondary | ICD-10-CM | POA: Diagnosis not present

## 2020-11-27 DIAGNOSIS — E1165 Type 2 diabetes mellitus with hyperglycemia: Secondary | ICD-10-CM | POA: Diagnosis not present

## 2020-11-27 DIAGNOSIS — E785 Hyperlipidemia, unspecified: Secondary | ICD-10-CM | POA: Diagnosis not present

## 2020-11-27 DIAGNOSIS — R531 Weakness: Secondary | ICD-10-CM | POA: Diagnosis not present

## 2020-11-27 DIAGNOSIS — I10 Essential (primary) hypertension: Secondary | ICD-10-CM | POA: Diagnosis not present

## 2020-11-27 DIAGNOSIS — D649 Anemia, unspecified: Secondary | ICD-10-CM | POA: Diagnosis not present

## 2020-11-28 DIAGNOSIS — R531 Weakness: Secondary | ICD-10-CM | POA: Diagnosis not present

## 2020-12-06 DIAGNOSIS — K219 Gastro-esophageal reflux disease without esophagitis: Secondary | ICD-10-CM | POA: Diagnosis not present

## 2020-12-06 DIAGNOSIS — G3109 Other frontotemporal dementia: Secondary | ICD-10-CM | POA: Diagnosis not present

## 2020-12-06 DIAGNOSIS — N400 Enlarged prostate without lower urinary tract symptoms: Secondary | ICD-10-CM | POA: Diagnosis not present

## 2020-12-06 DIAGNOSIS — Z794 Long term (current) use of insulin: Secondary | ICD-10-CM | POA: Diagnosis not present

## 2020-12-06 DIAGNOSIS — I10 Essential (primary) hypertension: Secondary | ICD-10-CM | POA: Diagnosis not present

## 2020-12-06 DIAGNOSIS — E119 Type 2 diabetes mellitus without complications: Secondary | ICD-10-CM | POA: Diagnosis not present

## 2020-12-06 DIAGNOSIS — R69 Illness, unspecified: Secondary | ICD-10-CM | POA: Diagnosis not present

## 2020-12-06 DIAGNOSIS — N390 Urinary tract infection, site not specified: Secondary | ICD-10-CM | POA: Diagnosis not present

## 2020-12-11 DIAGNOSIS — E1165 Type 2 diabetes mellitus with hyperglycemia: Secondary | ICD-10-CM | POA: Diagnosis not present

## 2020-12-11 DIAGNOSIS — I10 Essential (primary) hypertension: Secondary | ICD-10-CM | POA: Diagnosis not present

## 2020-12-11 DIAGNOSIS — R531 Weakness: Secondary | ICD-10-CM | POA: Diagnosis not present

## 2020-12-11 DIAGNOSIS — R69 Illness, unspecified: Secondary | ICD-10-CM | POA: Diagnosis not present

## 2020-12-11 DIAGNOSIS — D649 Anemia, unspecified: Secondary | ICD-10-CM | POA: Diagnosis not present

## 2020-12-11 DIAGNOSIS — E785 Hyperlipidemia, unspecified: Secondary | ICD-10-CM | POA: Diagnosis not present

## 2020-12-28 DIAGNOSIS — G3109 Other frontotemporal dementia: Secondary | ICD-10-CM | POA: Diagnosis not present

## 2020-12-28 DIAGNOSIS — K219 Gastro-esophageal reflux disease without esophagitis: Secondary | ICD-10-CM | POA: Diagnosis not present

## 2020-12-28 DIAGNOSIS — E119 Type 2 diabetes mellitus without complications: Secondary | ICD-10-CM | POA: Diagnosis not present

## 2020-12-28 DIAGNOSIS — Z794 Long term (current) use of insulin: Secondary | ICD-10-CM | POA: Diagnosis not present

## 2020-12-28 DIAGNOSIS — N400 Enlarged prostate without lower urinary tract symptoms: Secondary | ICD-10-CM | POA: Diagnosis not present

## 2020-12-28 DIAGNOSIS — R69 Illness, unspecified: Secondary | ICD-10-CM | POA: Diagnosis not present

## 2021-01-08 DIAGNOSIS — R69 Illness, unspecified: Secondary | ICD-10-CM | POA: Diagnosis not present

## 2021-01-08 DIAGNOSIS — G4700 Insomnia, unspecified: Secondary | ICD-10-CM | POA: Diagnosis not present

## 2021-01-08 DIAGNOSIS — F411 Generalized anxiety disorder: Secondary | ICD-10-CM | POA: Diagnosis not present

## 2021-01-16 DIAGNOSIS — R0602 Shortness of breath: Secondary | ICD-10-CM | POA: Diagnosis not present

## 2021-01-16 DIAGNOSIS — R0989 Other specified symptoms and signs involving the circulatory and respiratory systems: Secondary | ICD-10-CM | POA: Diagnosis not present

## 2021-01-25 DIAGNOSIS — M1612 Unilateral primary osteoarthritis, left hip: Secondary | ICD-10-CM | POA: Diagnosis not present

## 2021-01-25 DIAGNOSIS — M25552 Pain in left hip: Secondary | ICD-10-CM | POA: Diagnosis not present

## 2021-02-01 DIAGNOSIS — I6523 Occlusion and stenosis of bilateral carotid arteries: Secondary | ICD-10-CM | POA: Diagnosis not present

## 2021-02-01 DIAGNOSIS — R0902 Hypoxemia: Secondary | ICD-10-CM | POA: Diagnosis not present

## 2021-02-01 DIAGNOSIS — J9811 Atelectasis: Secondary | ICD-10-CM | POA: Diagnosis not present

## 2021-02-01 DIAGNOSIS — G9389 Other specified disorders of brain: Secondary | ICD-10-CM | POA: Diagnosis not present

## 2021-02-01 DIAGNOSIS — R404 Transient alteration of awareness: Secondary | ICD-10-CM | POA: Diagnosis not present

## 2021-02-01 DIAGNOSIS — R279 Unspecified lack of coordination: Secondary | ICD-10-CM | POA: Diagnosis not present

## 2021-02-01 DIAGNOSIS — R079 Chest pain, unspecified: Secondary | ICD-10-CM | POA: Diagnosis not present

## 2021-02-01 DIAGNOSIS — Z743 Need for continuous supervision: Secondary | ICD-10-CM | POA: Diagnosis not present

## 2021-02-01 DIAGNOSIS — R69 Illness, unspecified: Secondary | ICD-10-CM | POA: Diagnosis not present

## 2021-02-01 DIAGNOSIS — Y998 Other external cause status: Secondary | ICD-10-CM | POA: Diagnosis not present

## 2021-02-01 DIAGNOSIS — S0003XA Contusion of scalp, initial encounter: Secondary | ICD-10-CM | POA: Diagnosis not present

## 2021-02-01 DIAGNOSIS — W228XXA Striking against or struck by other objects, initial encounter: Secondary | ICD-10-CM | POA: Diagnosis not present

## 2021-02-01 DIAGNOSIS — S199XXA Unspecified injury of neck, initial encounter: Secondary | ICD-10-CM | POA: Diagnosis not present

## 2021-02-01 DIAGNOSIS — S0083XA Contusion of other part of head, initial encounter: Secondary | ICD-10-CM | POA: Diagnosis not present

## 2021-02-01 DIAGNOSIS — I10 Essential (primary) hypertension: Secondary | ICD-10-CM | POA: Diagnosis not present

## 2021-02-01 DIAGNOSIS — R4020 Unspecified coma: Secondary | ICD-10-CM | POA: Diagnosis not present

## 2021-02-05 DIAGNOSIS — F5101 Primary insomnia: Secondary | ICD-10-CM | POA: Diagnosis not present

## 2021-02-05 DIAGNOSIS — R69 Illness, unspecified: Secondary | ICD-10-CM | POA: Diagnosis not present

## 2021-02-05 DIAGNOSIS — F411 Generalized anxiety disorder: Secondary | ICD-10-CM | POA: Diagnosis not present

## 2021-02-12 DIAGNOSIS — K219 Gastro-esophageal reflux disease without esophagitis: Secondary | ICD-10-CM | POA: Diagnosis not present

## 2021-02-12 DIAGNOSIS — W19XXXD Unspecified fall, subsequent encounter: Secondary | ICD-10-CM | POA: Diagnosis not present

## 2021-02-12 DIAGNOSIS — R1310 Dysphagia, unspecified: Secondary | ICD-10-CM | POA: Diagnosis not present

## 2021-02-12 DIAGNOSIS — R69 Illness, unspecified: Secondary | ICD-10-CM | POA: Diagnosis not present

## 2021-02-12 DIAGNOSIS — R531 Weakness: Secondary | ICD-10-CM | POA: Diagnosis not present

## 2021-02-12 DIAGNOSIS — D649 Anemia, unspecified: Secondary | ICD-10-CM | POA: Diagnosis not present

## 2021-02-12 DIAGNOSIS — E1165 Type 2 diabetes mellitus with hyperglycemia: Secondary | ICD-10-CM | POA: Diagnosis not present

## 2021-02-12 DIAGNOSIS — E785 Hyperlipidemia, unspecified: Secondary | ICD-10-CM | POA: Diagnosis not present

## 2021-02-12 DIAGNOSIS — Z794 Long term (current) use of insulin: Secondary | ICD-10-CM | POA: Diagnosis not present

## 2021-02-12 DIAGNOSIS — I10 Essential (primary) hypertension: Secondary | ICD-10-CM | POA: Diagnosis not present

## 2021-02-12 DIAGNOSIS — E119 Type 2 diabetes mellitus without complications: Secondary | ICD-10-CM | POA: Diagnosis not present

## 2021-02-12 DIAGNOSIS — N400 Enlarged prostate without lower urinary tract symptoms: Secondary | ICD-10-CM | POA: Diagnosis not present

## 2021-02-12 DIAGNOSIS — F32A Depression, unspecified: Secondary | ICD-10-CM | POA: Diagnosis not present

## 2021-02-12 NOTE — Progress Notes (Deleted)
Assessment/Plan:   1.  Primary progressive aphasia  -***Levodopa challenge test demonstrated no efficacy to levodopa.  2.  Remote history of cerebral infarction  -Has seen Dr. Pearlean Brownie Subjective:   Daniel Morrow was seen today in follow up for PPA.  My previous records were reviewed prior to todays visit as well as outside records available to me.  I have not seen the patient in about a year and a half, and that was through a video visit.  Patient has had a levodopa challenge test done in the past, demonstrating no benefit to levodopa.  Patient was in the hospital in December with aspiration pneumonia.  He had associated difficulty with swallowing.  Swallow evaluation done in that hospital stay recommended dysphagia 1 (pure) diet with honey thick liquids.  Medications to be crushed with pure.  Patient was discharged to [facility] for nursing care.  Patient now living at [facility] full-time.  Patient just recently in the emergency room in [facility] on May 13 after a fall.  CT brain was performed and was nonacute.  Nursing facility now has him on Namenda, trazodone, quetiapine.   PREVIOUS MEDICATIONS: {Parkinson's RX:18200}  ALLERGIES:  No Known Allergies  CURRENT MEDICATIONS:  Outpatient Encounter Medications as of 02/14/2021  Medication Sig  . acetaminophen (TYLENOL) 325 MG tablet Take 650 mg by mouth every 6 (six) hours as needed for mild pain, fever or headache.  . brimonidine (ALPHAGAN) 0.2 % ophthalmic solution Place 1 drop into the right eye 2 (two) times daily.   . clonazePAM (KLONOPIN) 0.5 MG tablet Take 1 tablet (0.5 mg total) by mouth 2 (two) times daily as needed for anxiety.  . colesevelam (WELCHOL) 625 MG tablet Take 1,875 mg by mouth 2 (two) times daily with a meal.   . insulin aspart (NOVOLOG) 100 UNIT/ML injection Inject 5 Units into the skin 3 (three) times daily with meals.  . insulin glargine (LANTUS) 100 UNIT/ML injection Inject 0.15 mLs (15 Units total) into the  skin at bedtime.  Marland Kitchen latanoprost (XALATAN) 0.005 % ophthalmic solution Place 1 drop into the right eye at bedtime.   Marland Kitchen lisinopril (ZESTRIL) 2.5 MG tablet Take 1 tablet (2.5 mg total) by mouth daily.  . Maltodextrin-Xanthan Gum (RESOURCE THICKENUP CLEAR) POWD Use as needed  . memantine (NAMENDA) 10 MG tablet Take 1 tablet (10 mg total) by mouth 2 (two) times daily.  Marland Kitchen OZEMPIC, 1 MG/DOSE, 4 MG/3ML SOPN Inject 1 mg into the skin once a week. Thursday  . pantoprazole (PROTONIX) 20 MG tablet Take 20 mg by mouth 2 (two) times daily.   . QUEtiapine (SEROQUEL) 25 MG tablet Take 1 tablet (25 mg total) by mouth 3 (three) times daily.  . sertraline (ZOLOFT) 100 MG tablet Take 1.5 tablets (150 mg total) by mouth daily.  . tamsulosin (FLOMAX) 0.4 MG CAPS capsule Take 1 capsule (0.4 mg total) by mouth daily after supper.  . timolol (TIMOPTIC) 0.5 % ophthalmic solution Place 1 drop into the right eye 2 (two) times daily.  . traZODone (DESYREL) 100 MG tablet Take 100 mg by mouth at bedtime.  . vitamin B-12 (CYANOCOBALAMIN) 1000 MCG tablet Take 1,000 mcg by mouth daily.  . [DISCONTINUED] sucralfate (CARAFATE) 1 GM/10ML suspension Take 10 mLs (1 g total) by mouth 4 (four) times daily. (Patient not taking: Reported on 11/15/2019)   No facility-administered encounter medications on file as of 02/14/2021.    Objective:   PHYSICAL EXAMINATION:    VITALS:  There were  no vitals filed for this visit.  GEN:  The patient appears stated age and is in NAD. HEENT:  Normocephalic, atraumatic.  The mucous membranes are moist. The superficial temporal arteries are without ropiness or tenderness. CV:  RRR Lungs:  CTAB Neck/HEME:  There are no carotid bruits bilaterally.  Neurological examination:  Orientation: The patient is alert and oriented x3. Cranial nerves: There is good facial symmetry with*** facial hypomimia. The speech is fluent and clear. Soft palate rises symmetrically and there is no tongue deviation.  Hearing is intact to conversational tone. Sensation: Sensation is intact to light touch throughout Motor: Strength is at least antigravity x4.  Movement examination: Tone: There is ***tone in the *** Abnormal movements: *** Coordination:  There is *** decremation with RAM's, *** Gait and Station: The patient has *** difficulty arising out of a deep-seated chair without the use of the hands. The patient's stride length is ***.  The patient has a *** pull test.     I have reviewed and interpreted the following labs independently    Chemistry      Component Value Date/Time   NA 139 08/22/2020 1133   NA 144 06/03/2016 1240   K 3.6 08/22/2020 1133   CL 100 08/22/2020 1133   CO2 27 08/22/2020 1133   BUN 13 08/22/2020 1133   BUN 18 06/03/2016 1240   CREATININE 1.17 08/22/2020 1133      Component Value Date/Time   CALCIUM 8.8 (L) 08/22/2020 1133   ALKPHOS 73 08/07/2020 1311   AST 10 (L) 08/07/2020 1311   ALT 11 08/07/2020 1311   BILITOT 1.2 08/07/2020 1311       Lab Results  Component Value Date   WBC 6.8 08/23/2020   HGB 11.0 (L) 08/23/2020   HCT 34.8 (L) 08/23/2020   MCV 87.9 08/23/2020   PLT 199 08/23/2020    Lab Results  Component Value Date   TSH 1.394 09/13/2018     Total time spent on today's visit was ***30 minutes, including both face-to-face time and nonface-to-face time.  Time included that spent on review of records (prior notes available to me/labs/imaging if pertinent), discussing treatment and goals, answering patient's questions and coordinating care.  Cc:  Kirby Funk, MD

## 2021-02-14 ENCOUNTER — Ambulatory Visit: Payer: Medicare HMO | Admitting: Neurology

## 2021-02-19 DIAGNOSIS — F5101 Primary insomnia: Secondary | ICD-10-CM | POA: Diagnosis not present

## 2021-02-19 DIAGNOSIS — R531 Weakness: Secondary | ICD-10-CM | POA: Diagnosis not present

## 2021-02-19 DIAGNOSIS — E785 Hyperlipidemia, unspecified: Secondary | ICD-10-CM | POA: Diagnosis not present

## 2021-02-19 DIAGNOSIS — I10 Essential (primary) hypertension: Secondary | ICD-10-CM | POA: Diagnosis not present

## 2021-02-19 DIAGNOSIS — R5383 Other fatigue: Secondary | ICD-10-CM | POA: Diagnosis not present

## 2021-02-19 DIAGNOSIS — F411 Generalized anxiety disorder: Secondary | ICD-10-CM | POA: Diagnosis not present

## 2021-02-19 DIAGNOSIS — D649 Anemia, unspecified: Secondary | ICD-10-CM | POA: Diagnosis not present

## 2021-02-19 DIAGNOSIS — E1165 Type 2 diabetes mellitus with hyperglycemia: Secondary | ICD-10-CM | POA: Diagnosis not present

## 2021-02-19 DIAGNOSIS — R0989 Other specified symptoms and signs involving the circulatory and respiratory systems: Secondary | ICD-10-CM | POA: Diagnosis not present

## 2021-02-19 DIAGNOSIS — R69 Illness, unspecified: Secondary | ICD-10-CM | POA: Diagnosis not present

## 2021-02-22 DIAGNOSIS — I10 Essential (primary) hypertension: Secondary | ICD-10-CM | POA: Diagnosis not present

## 2021-02-22 DIAGNOSIS — E1165 Type 2 diabetes mellitus with hyperglycemia: Secondary | ICD-10-CM | POA: Diagnosis not present

## 2021-02-22 DIAGNOSIS — D649 Anemia, unspecified: Secondary | ICD-10-CM | POA: Diagnosis not present

## 2021-02-22 DIAGNOSIS — E785 Hyperlipidemia, unspecified: Secondary | ICD-10-CM | POA: Diagnosis not present

## 2021-02-22 DIAGNOSIS — R69 Illness, unspecified: Secondary | ICD-10-CM | POA: Diagnosis not present

## 2021-02-22 DIAGNOSIS — R531 Weakness: Secondary | ICD-10-CM | POA: Diagnosis not present

## 2021-03-05 DIAGNOSIS — R69 Illness, unspecified: Secondary | ICD-10-CM | POA: Diagnosis not present

## 2021-03-05 DIAGNOSIS — I10 Essential (primary) hypertension: Secondary | ICD-10-CM | POA: Diagnosis not present

## 2021-03-05 DIAGNOSIS — E785 Hyperlipidemia, unspecified: Secondary | ICD-10-CM | POA: Diagnosis not present

## 2021-03-05 DIAGNOSIS — D649 Anemia, unspecified: Secondary | ICD-10-CM | POA: Diagnosis not present

## 2021-03-05 DIAGNOSIS — E1165 Type 2 diabetes mellitus with hyperglycemia: Secondary | ICD-10-CM | POA: Diagnosis not present

## 2021-03-05 DIAGNOSIS — R531 Weakness: Secondary | ICD-10-CM | POA: Diagnosis not present

## 2021-03-22 DIAGNOSIS — I82621 Acute embolism and thrombosis of deep veins of right upper extremity: Secondary | ICD-10-CM | POA: Diagnosis not present

## 2021-03-22 DIAGNOSIS — R600 Localized edema: Secondary | ICD-10-CM | POA: Diagnosis not present

## 2021-03-29 DIAGNOSIS — G3109 Other frontotemporal dementia: Secondary | ICD-10-CM | POA: Diagnosis not present

## 2021-03-29 DIAGNOSIS — Z794 Long term (current) use of insulin: Secondary | ICD-10-CM | POA: Diagnosis not present

## 2021-03-29 DIAGNOSIS — K219 Gastro-esophageal reflux disease without esophagitis: Secondary | ICD-10-CM | POA: Diagnosis not present

## 2021-03-29 DIAGNOSIS — I10 Essential (primary) hypertension: Secondary | ICD-10-CM | POA: Diagnosis not present

## 2021-03-29 DIAGNOSIS — F32A Depression, unspecified: Secondary | ICD-10-CM | POA: Diagnosis not present

## 2021-03-29 DIAGNOSIS — E119 Type 2 diabetes mellitus without complications: Secondary | ICD-10-CM | POA: Diagnosis not present

## 2021-03-29 DIAGNOSIS — R69 Illness, unspecified: Secondary | ICD-10-CM | POA: Diagnosis not present

## 2021-03-29 DIAGNOSIS — F419 Anxiety disorder, unspecified: Secondary | ICD-10-CM | POA: Diagnosis not present

## 2021-03-29 DIAGNOSIS — N400 Enlarged prostate without lower urinary tract symptoms: Secondary | ICD-10-CM | POA: Diagnosis not present

## 2021-04-04 DIAGNOSIS — E1165 Type 2 diabetes mellitus with hyperglycemia: Secondary | ICD-10-CM | POA: Diagnosis not present

## 2021-04-11 DIAGNOSIS — E46 Unspecified protein-calorie malnutrition: Secondary | ICD-10-CM | POA: Diagnosis not present

## 2021-04-11 DIAGNOSIS — I10 Essential (primary) hypertension: Secondary | ICD-10-CM | POA: Diagnosis not present

## 2021-04-11 DIAGNOSIS — G9341 Metabolic encephalopathy: Secondary | ICD-10-CM | POA: Diagnosis not present

## 2021-04-11 DIAGNOSIS — R4182 Altered mental status, unspecified: Secondary | ICD-10-CM | POA: Diagnosis not present

## 2021-04-16 DIAGNOSIS — F5101 Primary insomnia: Secondary | ICD-10-CM | POA: Diagnosis not present

## 2021-04-16 DIAGNOSIS — F411 Generalized anxiety disorder: Secondary | ICD-10-CM | POA: Diagnosis not present

## 2021-04-16 DIAGNOSIS — R69 Illness, unspecified: Secondary | ICD-10-CM | POA: Diagnosis not present

## 2021-05-14 DIAGNOSIS — R69 Illness, unspecified: Secondary | ICD-10-CM | POA: Diagnosis not present

## 2021-05-14 DIAGNOSIS — F411 Generalized anxiety disorder: Secondary | ICD-10-CM | POA: Diagnosis not present

## 2021-05-14 DIAGNOSIS — F5101 Primary insomnia: Secondary | ICD-10-CM | POA: Diagnosis not present

## 2021-05-17 DIAGNOSIS — F419 Anxiety disorder, unspecified: Secondary | ICD-10-CM | POA: Diagnosis not present

## 2021-05-17 DIAGNOSIS — G309 Alzheimer's disease, unspecified: Secondary | ICD-10-CM | POA: Diagnosis not present

## 2021-05-17 DIAGNOSIS — R2681 Unsteadiness on feet: Secondary | ICD-10-CM | POA: Diagnosis not present

## 2021-05-17 DIAGNOSIS — F0391 Unspecified dementia with behavioral disturbance: Secondary | ICD-10-CM | POA: Diagnosis not present

## 2021-05-17 DIAGNOSIS — R69 Illness, unspecified: Secondary | ICD-10-CM | POA: Diagnosis not present

## 2021-05-17 DIAGNOSIS — R278 Other lack of coordination: Secondary | ICD-10-CM | POA: Diagnosis not present

## 2021-05-17 DIAGNOSIS — Z9181 History of falling: Secondary | ICD-10-CM | POA: Diagnosis not present

## 2021-05-25 DIAGNOSIS — R0902 Hypoxemia: Secondary | ICD-10-CM | POA: Diagnosis not present

## 2021-05-25 DIAGNOSIS — R062 Wheezing: Secondary | ICD-10-CM | POA: Diagnosis not present

## 2021-05-25 DIAGNOSIS — R0989 Other specified symptoms and signs involving the circulatory and respiratory systems: Secondary | ICD-10-CM | POA: Diagnosis not present

## 2021-05-28 DIAGNOSIS — R69 Illness, unspecified: Secondary | ICD-10-CM | POA: Diagnosis not present

## 2021-05-28 DIAGNOSIS — R627 Adult failure to thrive: Secondary | ICD-10-CM | POA: Diagnosis not present

## 2021-05-28 DIAGNOSIS — U071 COVID-19: Secondary | ICD-10-CM | POA: Diagnosis not present

## 2021-06-18 DIAGNOSIS — F32A Depression, unspecified: Secondary | ICD-10-CM | POA: Diagnosis not present

## 2021-06-18 DIAGNOSIS — K219 Gastro-esophageal reflux disease without esophagitis: Secondary | ICD-10-CM | POA: Diagnosis not present

## 2021-06-18 DIAGNOSIS — R1310 Dysphagia, unspecified: Secondary | ICD-10-CM | POA: Diagnosis not present

## 2021-06-18 DIAGNOSIS — I10 Essential (primary) hypertension: Secondary | ICD-10-CM | POA: Diagnosis not present

## 2021-06-18 DIAGNOSIS — N400 Enlarged prostate without lower urinary tract symptoms: Secondary | ICD-10-CM | POA: Diagnosis not present

## 2021-06-18 DIAGNOSIS — R69 Illness, unspecified: Secondary | ICD-10-CM | POA: Diagnosis not present

## 2021-06-18 DIAGNOSIS — E119 Type 2 diabetes mellitus without complications: Secondary | ICD-10-CM | POA: Diagnosis not present

## 2021-06-18 DIAGNOSIS — Z794 Long term (current) use of insulin: Secondary | ICD-10-CM | POA: Diagnosis not present

## 2021-06-25 DIAGNOSIS — E1165 Type 2 diabetes mellitus with hyperglycemia: Secondary | ICD-10-CM | POA: Diagnosis not present

## 2021-06-25 DIAGNOSIS — R531 Weakness: Secondary | ICD-10-CM | POA: Diagnosis not present

## 2021-06-25 DIAGNOSIS — E785 Hyperlipidemia, unspecified: Secondary | ICD-10-CM | POA: Diagnosis not present

## 2021-06-25 DIAGNOSIS — D649 Anemia, unspecified: Secondary | ICD-10-CM | POA: Diagnosis not present

## 2021-06-25 DIAGNOSIS — I10 Essential (primary) hypertension: Secondary | ICD-10-CM | POA: Diagnosis not present

## 2021-07-26 DIAGNOSIS — F0390 Unspecified dementia without behavioral disturbance: Secondary | ICD-10-CM | POA: Diagnosis not present

## 2021-07-26 DIAGNOSIS — K219 Gastro-esophageal reflux disease without esophagitis: Secondary | ICD-10-CM | POA: Diagnosis not present

## 2021-07-26 DIAGNOSIS — N400 Enlarged prostate without lower urinary tract symptoms: Secondary | ICD-10-CM | POA: Diagnosis not present

## 2021-07-26 DIAGNOSIS — I10 Essential (primary) hypertension: Secondary | ICD-10-CM | POA: Diagnosis not present

## 2021-07-26 DIAGNOSIS — Z794 Long term (current) use of insulin: Secondary | ICD-10-CM | POA: Diagnosis not present

## 2021-07-26 DIAGNOSIS — E119 Type 2 diabetes mellitus without complications: Secondary | ICD-10-CM | POA: Diagnosis not present

## 2021-07-26 DIAGNOSIS — F32A Depression, unspecified: Secondary | ICD-10-CM | POA: Diagnosis not present

## 2021-07-26 DIAGNOSIS — R69 Illness, unspecified: Secondary | ICD-10-CM | POA: Diagnosis not present

## 2021-07-26 DIAGNOSIS — Z7985 Long-term (current) use of injectable non-insulin antidiabetic drugs: Secondary | ICD-10-CM | POA: Diagnosis not present

## 2021-08-13 DIAGNOSIS — F5101 Primary insomnia: Secondary | ICD-10-CM | POA: Diagnosis not present

## 2021-08-13 DIAGNOSIS — F411 Generalized anxiety disorder: Secondary | ICD-10-CM | POA: Diagnosis not present

## 2021-08-13 DIAGNOSIS — F03918 Unspecified dementia, unspecified severity, with other behavioral disturbance: Secondary | ICD-10-CM | POA: Diagnosis not present

## 2021-08-13 DIAGNOSIS — R69 Illness, unspecified: Secondary | ICD-10-CM | POA: Diagnosis not present

## 2021-09-24 DIAGNOSIS — E11649 Type 2 diabetes mellitus with hypoglycemia without coma: Secondary | ICD-10-CM | POA: Diagnosis not present

## 2021-10-02 DIAGNOSIS — D649 Anemia, unspecified: Secondary | ICD-10-CM | POA: Diagnosis not present

## 2021-10-02 DIAGNOSIS — E11649 Type 2 diabetes mellitus with hypoglycemia without coma: Secondary | ICD-10-CM | POA: Diagnosis not present

## 2021-10-02 DIAGNOSIS — I10 Essential (primary) hypertension: Secondary | ICD-10-CM | POA: Diagnosis not present

## 2021-10-02 DIAGNOSIS — E1165 Type 2 diabetes mellitus with hyperglycemia: Secondary | ICD-10-CM | POA: Diagnosis not present

## 2021-10-02 DIAGNOSIS — R531 Weakness: Secondary | ICD-10-CM | POA: Diagnosis not present

## 2021-10-02 DIAGNOSIS — E785 Hyperlipidemia, unspecified: Secondary | ICD-10-CM | POA: Diagnosis not present

## 2021-10-04 DIAGNOSIS — R69 Illness, unspecified: Secondary | ICD-10-CM | POA: Diagnosis not present

## 2021-10-04 DIAGNOSIS — R1310 Dysphagia, unspecified: Secondary | ICD-10-CM | POA: Diagnosis not present

## 2021-10-04 DIAGNOSIS — G3109 Other frontotemporal dementia: Secondary | ICD-10-CM | POA: Diagnosis not present

## 2021-10-04 DIAGNOSIS — K219 Gastro-esophageal reflux disease without esophagitis: Secondary | ICD-10-CM | POA: Diagnosis not present

## 2021-10-04 DIAGNOSIS — Z794 Long term (current) use of insulin: Secondary | ICD-10-CM | POA: Diagnosis not present

## 2021-10-04 DIAGNOSIS — E119 Type 2 diabetes mellitus without complications: Secondary | ICD-10-CM | POA: Diagnosis not present

## 2021-10-04 DIAGNOSIS — N400 Enlarged prostate without lower urinary tract symptoms: Secondary | ICD-10-CM | POA: Diagnosis not present

## 2021-10-04 DIAGNOSIS — K625 Hemorrhage of anus and rectum: Secondary | ICD-10-CM | POA: Diagnosis not present

## 2021-10-04 DIAGNOSIS — I10 Essential (primary) hypertension: Secondary | ICD-10-CM | POA: Diagnosis not present

## 2021-11-12 DIAGNOSIS — F5101 Primary insomnia: Secondary | ICD-10-CM | POA: Diagnosis not present

## 2021-11-12 DIAGNOSIS — R69 Illness, unspecified: Secondary | ICD-10-CM | POA: Diagnosis not present

## 2021-11-12 DIAGNOSIS — F411 Generalized anxiety disorder: Secondary | ICD-10-CM | POA: Diagnosis not present

## 2021-11-12 DIAGNOSIS — F03918 Unspecified dementia, unspecified severity, with other behavioral disturbance: Secondary | ICD-10-CM | POA: Diagnosis not present

## 2021-11-21 DIAGNOSIS — E1165 Type 2 diabetes mellitus with hyperglycemia: Secondary | ICD-10-CM | POA: Diagnosis not present

## 2021-11-22 DIAGNOSIS — Z794 Long term (current) use of insulin: Secondary | ICD-10-CM | POA: Diagnosis not present

## 2021-11-22 DIAGNOSIS — E1159 Type 2 diabetes mellitus with other circulatory complications: Secondary | ICD-10-CM | POA: Diagnosis not present

## 2021-11-22 DIAGNOSIS — G3109 Other frontotemporal dementia: Secondary | ICD-10-CM | POA: Diagnosis not present

## 2021-11-22 DIAGNOSIS — I10 Essential (primary) hypertension: Secondary | ICD-10-CM | POA: Diagnosis not present

## 2021-11-22 DIAGNOSIS — R69 Illness, unspecified: Secondary | ICD-10-CM | POA: Diagnosis not present

## 2021-11-22 DIAGNOSIS — K219 Gastro-esophageal reflux disease without esophagitis: Secondary | ICD-10-CM | POA: Diagnosis not present

## 2021-12-16 DIAGNOSIS — G9341 Metabolic encephalopathy: Secondary | ICD-10-CM | POA: Diagnosis not present

## 2021-12-16 DIAGNOSIS — R278 Other lack of coordination: Secondary | ICD-10-CM | POA: Diagnosis not present

## 2021-12-16 DIAGNOSIS — R2681 Unsteadiness on feet: Secondary | ICD-10-CM | POA: Diagnosis not present

## 2021-12-16 DIAGNOSIS — Z9181 History of falling: Secondary | ICD-10-CM | POA: Diagnosis not present

## 2021-12-16 DIAGNOSIS — R69 Illness, unspecified: Secondary | ICD-10-CM | POA: Diagnosis not present

## 2021-12-18 DIAGNOSIS — R278 Other lack of coordination: Secondary | ICD-10-CM | POA: Diagnosis not present

## 2021-12-18 DIAGNOSIS — G9341 Metabolic encephalopathy: Secondary | ICD-10-CM | POA: Diagnosis not present

## 2021-12-18 DIAGNOSIS — E1165 Type 2 diabetes mellitus with hyperglycemia: Secondary | ICD-10-CM | POA: Diagnosis not present

## 2021-12-18 DIAGNOSIS — Z9181 History of falling: Secondary | ICD-10-CM | POA: Diagnosis not present

## 2021-12-18 DIAGNOSIS — R2681 Unsteadiness on feet: Secondary | ICD-10-CM | POA: Diagnosis not present

## 2021-12-18 DIAGNOSIS — R69 Illness, unspecified: Secondary | ICD-10-CM | POA: Diagnosis not present

## 2021-12-18 DIAGNOSIS — E11649 Type 2 diabetes mellitus with hypoglycemia without coma: Secondary | ICD-10-CM | POA: Diagnosis not present

## 2021-12-18 DIAGNOSIS — I10 Essential (primary) hypertension: Secondary | ICD-10-CM | POA: Diagnosis not present

## 2021-12-18 DIAGNOSIS — D649 Anemia, unspecified: Secondary | ICD-10-CM | POA: Diagnosis not present

## 2021-12-18 DIAGNOSIS — R531 Weakness: Secondary | ICD-10-CM | POA: Diagnosis not present

## 2021-12-18 DIAGNOSIS — E785 Hyperlipidemia, unspecified: Secondary | ICD-10-CM | POA: Diagnosis not present

## 2021-12-19 DIAGNOSIS — M25551 Pain in right hip: Secondary | ICD-10-CM | POA: Diagnosis not present

## 2021-12-19 DIAGNOSIS — S72001A Fracture of unspecified part of neck of right femur, initial encounter for closed fracture: Secondary | ICD-10-CM | POA: Diagnosis not present

## 2021-12-19 DIAGNOSIS — D72829 Elevated white blood cell count, unspecified: Secondary | ICD-10-CM | POA: Diagnosis not present

## 2021-12-20 ENCOUNTER — Inpatient Hospital Stay (HOSPITAL_COMMUNITY)
Admission: EM | Admit: 2021-12-20 | Discharge: 2021-12-23 | DRG: 535 | Disposition: A | Discharge status: Skilled Nursing Facility | Payer: Medicare HMO | Attending: Family Medicine | Admitting: Family Medicine

## 2021-12-20 ENCOUNTER — Inpatient Hospital Stay (HOSPITAL_COMMUNITY): Payer: Medicare HMO

## 2021-12-20 ENCOUNTER — Emergency Department (HOSPITAL_COMMUNITY): Payer: Medicare HMO

## 2021-12-20 ENCOUNTER — Encounter (HOSPITAL_COMMUNITY): Payer: Self-pay

## 2021-12-20 ENCOUNTER — Other Ambulatory Visit: Payer: Self-pay

## 2021-12-20 DIAGNOSIS — Z8673 Personal history of transient ischemic attack (TIA), and cerebral infarction without residual deficits: Secondary | ICD-10-CM

## 2021-12-20 DIAGNOSIS — F02C3 Dementia in other diseases classified elsewhere, severe, with mood disturbance: Secondary | ICD-10-CM | POA: Diagnosis present

## 2021-12-20 DIAGNOSIS — I739 Peripheral vascular disease, unspecified: Secondary | ICD-10-CM | POA: Diagnosis not present

## 2021-12-20 DIAGNOSIS — W06XXXA Fall from bed, initial encounter: Secondary | ICD-10-CM | POA: Diagnosis not present

## 2021-12-20 DIAGNOSIS — F419 Anxiety disorder, unspecified: Secondary | ICD-10-CM

## 2021-12-20 DIAGNOSIS — S72001A Fracture of unspecified part of neck of right femur, initial encounter for closed fracture: Principal | ICD-10-CM | POA: Diagnosis present

## 2021-12-20 DIAGNOSIS — M4802 Spinal stenosis, cervical region: Secondary | ICD-10-CM | POA: Diagnosis not present

## 2021-12-20 DIAGNOSIS — M47812 Spondylosis without myelopathy or radiculopathy, cervical region: Secondary | ICD-10-CM | POA: Diagnosis not present

## 2021-12-20 DIAGNOSIS — I959 Hypotension, unspecified: Secondary | ICD-10-CM | POA: Diagnosis not present

## 2021-12-20 DIAGNOSIS — R0902 Hypoxemia: Secondary | ICD-10-CM | POA: Diagnosis not present

## 2021-12-20 DIAGNOSIS — R69 Illness, unspecified: Secondary | ICD-10-CM | POA: Diagnosis not present

## 2021-12-20 DIAGNOSIS — Z794 Long term (current) use of insulin: Secondary | ICD-10-CM | POA: Diagnosis not present

## 2021-12-20 DIAGNOSIS — W19XXXA Unspecified fall, initial encounter: Secondary | ICD-10-CM | POA: Diagnosis not present

## 2021-12-20 DIAGNOSIS — Z9189 Other specified personal risk factors, not elsewhere classified: Secondary | ICD-10-CM | POA: Diagnosis not present

## 2021-12-20 DIAGNOSIS — Z79899 Other long term (current) drug therapy: Secondary | ICD-10-CM | POA: Diagnosis not present

## 2021-12-20 DIAGNOSIS — F0280 Dementia in other diseases classified elsewhere without behavioral disturbance: Secondary | ICD-10-CM | POA: Diagnosis present

## 2021-12-20 DIAGNOSIS — E785 Hyperlipidemia, unspecified: Secondary | ICD-10-CM | POA: Diagnosis present

## 2021-12-20 DIAGNOSIS — S7291XA Unspecified fracture of right femur, initial encounter for closed fracture: Secondary | ICD-10-CM | POA: Diagnosis not present

## 2021-12-20 DIAGNOSIS — Z9841 Cataract extraction status, right eye: Secondary | ICD-10-CM

## 2021-12-20 DIAGNOSIS — Y92003 Bedroom of unspecified non-institutional (private) residence as the place of occurrence of the external cause: Secondary | ICD-10-CM | POA: Diagnosis not present

## 2021-12-20 DIAGNOSIS — N400 Enlarged prostate without lower urinary tract symptoms: Secondary | ICD-10-CM | POA: Diagnosis present

## 2021-12-20 DIAGNOSIS — F02C4 Dementia in other diseases classified elsewhere, severe, with anxiety: Secondary | ICD-10-CM | POA: Diagnosis present

## 2021-12-20 DIAGNOSIS — F32A Depression, unspecified: Secondary | ICD-10-CM | POA: Diagnosis present

## 2021-12-20 DIAGNOSIS — R404 Transient alteration of awareness: Secondary | ICD-10-CM | POA: Diagnosis not present

## 2021-12-20 DIAGNOSIS — F0283 Dementia in other diseases classified elsewhere, unspecified severity, with mood disturbance: Secondary | ICD-10-CM | POA: Diagnosis present

## 2021-12-20 DIAGNOSIS — Z66 Do not resuscitate: Secondary | ICD-10-CM | POA: Diagnosis present

## 2021-12-20 DIAGNOSIS — N390 Urinary tract infection, site not specified: Secondary | ICD-10-CM | POA: Diagnosis present

## 2021-12-20 DIAGNOSIS — J9601 Acute respiratory failure with hypoxia: Secondary | ICD-10-CM

## 2021-12-20 DIAGNOSIS — S0990XA Unspecified injury of head, initial encounter: Secondary | ICD-10-CM | POA: Diagnosis not present

## 2021-12-20 DIAGNOSIS — Z87891 Personal history of nicotine dependence: Secondary | ICD-10-CM | POA: Diagnosis not present

## 2021-12-20 DIAGNOSIS — I2699 Other pulmonary embolism without acute cor pulmonale: Secondary | ICD-10-CM | POA: Diagnosis not present

## 2021-12-20 DIAGNOSIS — Z961 Presence of intraocular lens: Secondary | ICD-10-CM | POA: Diagnosis present

## 2021-12-20 DIAGNOSIS — K219 Gastro-esophageal reflux disease without esophagitis: Secondary | ICD-10-CM | POA: Diagnosis present

## 2021-12-20 DIAGNOSIS — F418 Other specified anxiety disorders: Secondary | ICD-10-CM | POA: Insufficient documentation

## 2021-12-20 DIAGNOSIS — J189 Pneumonia, unspecified organism: Secondary | ICD-10-CM

## 2021-12-20 DIAGNOSIS — Z9842 Cataract extraction status, left eye: Secondary | ICD-10-CM

## 2021-12-20 DIAGNOSIS — R54 Age-related physical debility: Secondary | ICD-10-CM | POA: Diagnosis present

## 2021-12-20 DIAGNOSIS — Z7401 Bed confinement status: Secondary | ICD-10-CM

## 2021-12-20 DIAGNOSIS — Z981 Arthrodesis status: Secondary | ICD-10-CM

## 2021-12-20 DIAGNOSIS — Z043 Encounter for examination and observation following other accident: Secondary | ICD-10-CM | POA: Diagnosis not present

## 2021-12-20 DIAGNOSIS — H5462 Unqualified visual loss, left eye, normal vision right eye: Secondary | ICD-10-CM | POA: Diagnosis present

## 2021-12-20 DIAGNOSIS — E1165 Type 2 diabetes mellitus with hyperglycemia: Secondary | ICD-10-CM | POA: Diagnosis not present

## 2021-12-20 DIAGNOSIS — I10 Essential (primary) hypertension: Secondary | ICD-10-CM | POA: Diagnosis present

## 2021-12-20 DIAGNOSIS — G3109 Other frontotemporal dementia: Secondary | ICD-10-CM | POA: Diagnosis not present

## 2021-12-20 DIAGNOSIS — Z888 Allergy status to other drugs, medicaments and biological substances status: Secondary | ICD-10-CM

## 2021-12-20 DIAGNOSIS — Z743 Need for continuous supervision: Secondary | ICD-10-CM | POA: Diagnosis not present

## 2021-12-20 DIAGNOSIS — M4312 Spondylolisthesis, cervical region: Secondary | ICD-10-CM | POA: Diagnosis not present

## 2021-12-20 DIAGNOSIS — J9811 Atelectasis: Secondary | ICD-10-CM | POA: Diagnosis not present

## 2021-12-20 DIAGNOSIS — R4182 Altered mental status, unspecified: Secondary | ICD-10-CM | POA: Diagnosis not present

## 2021-12-20 DIAGNOSIS — S199XXA Unspecified injury of neck, initial encounter: Secondary | ICD-10-CM | POA: Diagnosis not present

## 2021-12-20 DIAGNOSIS — Z833 Family history of diabetes mellitus: Secondary | ICD-10-CM

## 2021-12-20 DIAGNOSIS — E119 Type 2 diabetes mellitus without complications: Secondary | ICD-10-CM | POA: Diagnosis present

## 2021-12-20 LAB — CBC WITH DIFFERENTIAL/PLATELET
Abs Immature Granulocytes: 0.04 10*3/uL (ref 0.00–0.07)
Basophils Absolute: 0.1 10*3/uL (ref 0.0–0.1)
Basophils Relative: 1 %
Eosinophils Absolute: 0.5 10*3/uL (ref 0.0–0.5)
Eosinophils Relative: 5 %
HCT: 39.4 % (ref 39.0–52.0)
Hemoglobin: 12.9 g/dL — ABNORMAL LOW (ref 13.0–17.0)
Immature Granulocytes: 0 %
Lymphocytes Relative: 10 %
Lymphs Abs: 0.9 10*3/uL (ref 0.7–4.0)
MCH: 30.6 pg (ref 26.0–34.0)
MCHC: 32.7 g/dL (ref 30.0–36.0)
MCV: 93.4 fL (ref 80.0–100.0)
Monocytes Absolute: 0.7 10*3/uL (ref 0.1–1.0)
Monocytes Relative: 7 %
Neutro Abs: 6.9 10*3/uL (ref 1.7–7.7)
Neutrophils Relative %: 77 %
Platelets: 146 10*3/uL — ABNORMAL LOW (ref 150–400)
RBC: 4.22 MIL/uL (ref 4.22–5.81)
RDW: 13.8 % (ref 11.5–15.5)
WBC: 9.1 10*3/uL (ref 4.0–10.5)
nRBC: 0 % (ref 0.0–0.2)

## 2021-12-20 LAB — URINALYSIS, ROUTINE W REFLEX MICROSCOPIC
Bacteria, UA: NONE SEEN
Bilirubin Urine: NEGATIVE
Glucose, UA: 50 mg/dL — AB
Hgb urine dipstick: NEGATIVE
Ketones, ur: NEGATIVE mg/dL
Nitrite: NEGATIVE
Protein, ur: NEGATIVE mg/dL
Specific Gravity, Urine: 1.025 (ref 1.005–1.030)
pH: 5 (ref 5.0–8.0)

## 2021-12-20 LAB — COMPREHENSIVE METABOLIC PANEL
ALT: 13 U/L (ref 0–44)
AST: 11 U/L — ABNORMAL LOW (ref 15–41)
Albumin: 3.2 g/dL — ABNORMAL LOW (ref 3.5–5.0)
Alkaline Phosphatase: 63 U/L (ref 38–126)
Anion gap: 7 (ref 5–15)
BUN: 25 mg/dL — ABNORMAL HIGH (ref 8–23)
CO2: 28 mmol/L (ref 22–32)
Calcium: 8.6 mg/dL — ABNORMAL LOW (ref 8.9–10.3)
Chloride: 105 mmol/L (ref 98–111)
Creatinine, Ser: 1.24 mg/dL (ref 0.61–1.24)
GFR, Estimated: 59 mL/min — ABNORMAL LOW (ref >60)
Glucose, Bld: 149 mg/dL — ABNORMAL HIGH (ref 70–99)
Potassium: 3.9 mmol/L (ref 3.5–5.1)
Sodium: 140 mmol/L (ref 135–145)
Total Bilirubin: 1.2 mg/dL (ref 0.3–1.2)
Total Protein: 5.8 g/dL — ABNORMAL LOW (ref 6.5–8.1)

## 2021-12-20 LAB — CBG MONITORING, ED: Glucose-Capillary: 133 mg/dL — ABNORMAL HIGH (ref 70–99)

## 2021-12-20 LAB — PROTIME-INR
INR: 1.2 (ref 0.8–1.2)
Prothrombin Time: 15 seconds (ref 11.4–15.2)

## 2021-12-20 LAB — LACTIC ACID, PLASMA: Lactic Acid, Venous: 1.2 mmol/L (ref 0.5–1.9)

## 2021-12-20 LAB — D-DIMER, QUANTITATIVE: D-Dimer, Quant: 3.12 ug/mL-FEU — ABNORMAL HIGH (ref 0.00–0.50)

## 2021-12-20 LAB — APTT: aPTT: 27 seconds (ref 24–36)

## 2021-12-20 MED ORDER — LISINOPRIL 5 MG PO TABS
5.0000 mg | ORAL_TABLET | Freq: Every day | ORAL | Status: DC
  Administered 2021-12-21 – 2021-12-23 (×3): 5 mg via ORAL
  Filled 2021-12-20 (×3): qty 1

## 2021-12-20 MED ORDER — LACTATED RINGERS IV BOLUS
250.0000 mL | Freq: Once | INTRAVENOUS | Status: AC
  Administered 2021-12-20: 250 mL via INTRAVENOUS

## 2021-12-20 MED ORDER — BRIMONIDINE TARTRATE 0.2 % OP SOLN
1.0000 [drp] | Freq: Two times a day (BID) | OPHTHALMIC | Status: DC
  Administered 2021-12-20 – 2021-12-23 (×6): 1 [drp] via OPHTHALMIC
  Filled 2021-12-20: qty 5

## 2021-12-20 MED ORDER — QUETIAPINE FUMARATE 50 MG PO TABS
50.0000 mg | ORAL_TABLET | Freq: Two times a day (BID) | ORAL | Status: DC
  Administered 2021-12-20 – 2021-12-21 (×2): 50 mg via ORAL
  Filled 2021-12-20 (×3): qty 1

## 2021-12-20 MED ORDER — HYDROCODONE-ACETAMINOPHEN 5-325 MG PO TABS: 1.0000 | ORAL_TABLET | Freq: Four times a day (QID) | ORAL | Status: DC | PRN

## 2021-12-20 MED ORDER — ENOXAPARIN SODIUM 40 MG/0.4ML IJ SOSY
40.0000 mg | PREFILLED_SYRINGE | INTRAMUSCULAR | Status: DC
  Administered 2021-12-20: 40 mg via SUBCUTANEOUS
  Filled 2021-12-20: qty 0.4

## 2021-12-20 MED ORDER — INSULIN ASPART 100 UNIT/ML IJ SOLN
0.0000 [IU] | Freq: Three times a day (TID) | INTRAMUSCULAR | Status: DC
  Administered 2021-12-20: 1 [IU] via SUBCUTANEOUS
  Administered 2021-12-21: 3 [IU] via SUBCUTANEOUS
  Administered 2021-12-21: 2 [IU] via SUBCUTANEOUS
  Administered 2021-12-21: 1 [IU] via SUBCUTANEOUS
  Administered 2021-12-22: 2 [IU] via SUBCUTANEOUS
  Administered 2021-12-22: 3 [IU] via SUBCUTANEOUS
  Administered 2021-12-22: 7 [IU] via SUBCUTANEOUS
  Administered 2021-12-23: 2 [IU] via SUBCUTANEOUS
  Administered 2021-12-23: 5 [IU] via SUBCUTANEOUS

## 2021-12-20 MED ORDER — PANTOPRAZOLE SODIUM 40 MG PO TBEC
40.0000 mg | DELAYED_RELEASE_TABLET | Freq: Every day | ORAL | Status: DC
  Administered 2021-12-20 – 2021-12-23 (×4): 40 mg via ORAL
  Filled 2021-12-20 (×4): qty 1

## 2021-12-20 MED ORDER — IOHEXOL 350 MG/ML SOLN
50.0000 mL | Freq: Once | INTRAVENOUS | Status: AC | PRN
  Administered 2021-12-20: 50 mL via INTRAVENOUS

## 2021-12-20 MED ORDER — BASAGLAR KWIKPEN 100 UNIT/ML ~~LOC~~ SOPN
8.0000 [IU] | PEN_INJECTOR | Freq: Every morning | SUBCUTANEOUS | Status: DC
  Administered 2021-12-21 – 2021-12-23 (×3): 8 [IU] via SUBCUTANEOUS
  Filled 2021-12-20: qty 3

## 2021-12-20 MED ORDER — SODIUM CHLORIDE 0.9 % IV SOLN
1.0000 g | Freq: Once | INTRAVENOUS | Status: AC
  Administered 2021-12-20: 1 g via INTRAVENOUS
  Filled 2021-12-20: qty 10

## 2021-12-20 MED ORDER — MEMANTINE HCL 10 MG PO TABS
10.0000 mg | ORAL_TABLET | Freq: Two times a day (BID) | ORAL | Status: DC
  Administered 2021-12-20 – 2021-12-23 (×6): 10 mg via ORAL
  Filled 2021-12-20 (×8): qty 1

## 2021-12-20 MED ORDER — HEPARIN (PORCINE) 25000 UT/250ML-% IV SOLN
1350.0000 [IU]/h | INTRAVENOUS | Status: DC
  Administered 2021-12-20: 950 [IU]/h via INTRAVENOUS
  Administered 2021-12-21: 1200 [IU]/h via INTRAVENOUS
  Filled 2021-12-20 (×2): qty 250

## 2021-12-20 MED ORDER — MORPHINE SULFATE (PF) 2 MG/ML IV SOLN: 0.5000 mg | INTRAVENOUS | Status: DC | PRN

## 2021-12-20 MED ORDER — SERTRALINE HCL 50 MG PO TABS
50.0000 mg | ORAL_TABLET | Freq: Every day | ORAL | Status: DC
  Administered 2021-12-20 – 2021-12-23 (×4): 50 mg via ORAL
  Filled 2021-12-20 (×4): qty 1

## 2021-12-20 MED ORDER — SODIUM CHLORIDE 0.9 % IV SOLN
500.0000 mg | INTRAVENOUS | Status: DC
  Administered 2021-12-20 – 2021-12-21 (×2): 500 mg via INTRAVENOUS
  Filled 2021-12-20 (×2): qty 5

## 2021-12-20 MED ORDER — SODIUM CHLORIDE 0.9 % IV SOLN
1.0000 g | Freq: Once | INTRAVENOUS | Status: AC
  Administered 2021-12-21: 1 g via INTRAVENOUS
  Filled 2021-12-20: qty 10

## 2021-12-20 MED ORDER — SERTRALINE HCL 50 MG PO TABS
25.0000 mg | ORAL_TABLET | Freq: Every day | ORAL | Status: DC
Start: 2021-12-20 — End: 2021-12-23
  Administered 2021-12-20 – 2021-12-23 (×4): 25 mg via ORAL
  Filled 2021-12-20 (×4): qty 1

## 2021-12-20 MED ORDER — TIMOLOL MALEATE 0.5 % OP SOLN
1.0000 [drp] | Freq: Two times a day (BID) | OPHTHALMIC | Status: DC
  Administered 2021-12-20 – 2021-12-23 (×6): 1 [drp] via OPHTHALMIC
  Filled 2021-12-20: qty 5

## 2021-12-20 MED ORDER — TAMSULOSIN HCL 0.4 MG PO CAPS
0.4000 mg | ORAL_CAPSULE | Freq: Every day | ORAL | Status: DC
  Administered 2021-12-20 – 2021-12-23 (×4): 0.4 mg via ORAL
  Filled 2021-12-20 (×4): qty 1

## 2021-12-20 MED ORDER — LATANOPROST 0.005 % OP SOLN
1.0000 [drp] | Freq: Every day | OPHTHALMIC | Status: DC
  Administered 2021-12-21 – 2021-12-22 (×2): 1 [drp] via OPHTHALMIC
  Filled 2021-12-20 (×2): qty 2.5

## 2021-12-20 MED ORDER — TRAZODONE HCL 150 MG PO TABS
75.0000 mg | ORAL_TABLET | Freq: Every day | ORAL | Status: DC
  Administered 2021-12-20 – 2021-12-22 (×3): 75 mg via ORAL
  Filled 2021-12-20 (×3): qty 1

## 2021-12-20 MED ORDER — PROSOURCE PLUS PO LIQD
30.0000 mL | Freq: Two times a day (BID) | ORAL | Status: DC
  Administered 2021-12-21: 30 mL via ORAL
  Filled 2021-12-20 (×4): qty 30

## 2021-12-20 MED ORDER — SENNOSIDES-DOCUSATE SODIUM 8.6-50 MG PO TABS
ORAL_TABLET | Freq: Every day | ORAL | Status: DC
  Administered 2021-12-20 – 2021-12-22 (×3): 1 via ORAL
  Filled 2021-12-20 (×3): qty 1

## 2021-12-20 MED ORDER — HEPARIN BOLUS VIA INFUSION
3500.0000 [IU] | Freq: Once | INTRAVENOUS | Status: AC
  Administered 2021-12-20: 3500 [IU] via INTRAVENOUS
  Filled 2021-12-20: qty 3500

## 2021-12-20 NOTE — H&P (Signed)
?History and Physical  ? ? ?Patient: Daniel Morrow IOE:[id number] DOB: 1941-03-24 ?DOA: 12/20/2021 ?DOS: the patient was seen and examined on 12/20/2021 ?PCP: Kirby Funk, MD  ?Patient coming from: [facility]  ? ? ?Chief Complaint: fall/hip fracture  ? ?HPI: Daniel Morrow is a 81 y.o. male with medical history significant of HTN, HLD, chronic bronchitis, recurrent UTI, hx of CVA, T2DM,  dementia with fall on Wednesday with no imaging at SNF. He had some grimacing and they did xray yesterday which showed hip fracture and sent him to ER today. 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 He also does not walk.  ? ?History from chart and family.  ?He was sitting on side of bed on Wednesday and fell off side of bed. Yesterday afternoon when they were changing him he started moaning when they picked up his right leg.  ? ?Also prophylactic treating him for UTI off a UA and was given 1g of rocephin at SNF yesterday.   ? ?He has history of being on oxygen when he first was in [facility] 10/2020 and was on it for awhile, but wife states he has been off of it for awhile.  ? ?ROS can not be obtained by patient. No fevers, no v/D, no leg swelling per family.  ? ? ?ER Course:  vitals: afebrile, bp: 121/71, HR: 80, RR: 20, oxygen: 85%RA>92% on 3L [place] ?Pertinent labs: none ?Pelvis xray: impacted fracture of the right femoral neck ?CXR: low lung volumes with bibasilar opacities which could be atelectasis or developing infection. ? Trace pleural effusion.  ?CTH: no acute findings.  ?In ED: ortho consulted. BC obtained. Given azith+rocephin.  ? ? ?Review of Systems: unable to review all systems due to the inability of the patient to answer questions. ?Past Medical History:  ?Diagnosis Date  ? Aphagia   ? Arthritis   ? "knees" (09/14/2018)  ? Blind left eye   ? Chronic bronchitis (HCC)   ? Dementia (HCC)   ? Depression   ? GERD (gastroesophageal reflux disease)   ? Glaucoma   ? History of kidney stones   ? Hyperlipidemia   ?  Hypertension   ? Pneumonia   ? "maybe twice" (09/14/2018)  ? Recurrent UTI (urinary tract infection)   ? Stroke [facility]) 11/2011  ? "didn't effect him at the time" (09/14/2018)  ? Type II diabetes mellitus (HCC)   ? ?Past Surgical History:  ?Procedure Laterality Date  ? ABDOMINAL HERNIA REPAIR    ? CATARACT EXTRACTION W/ INTRAOCULAR LENS  IMPLANT, BILATERAL Bilateral   ? COLONOSCOPY    ? ESOPHAGOGASTRODUODENOSCOPY N/A 01/11/2019  ? Procedure: ESOPHAGOGASTRODUODENOSCOPY (EGD);  Surgeon: Meryl Dare, MD;  Location: [facility] ENDOSCOPY;  Service: Endoscopy;  Laterality: N/A;  ? ESOPHAGOGASTRODUODENOSCOPY Left 10/04/2019  ? Procedure: ESOPHAGOGASTRODUODENOSCOPY (EGD);  Surgeon: Shellia Cleverly, DO;  Location: [facility] ENDOSCOPY;  Service: Gastroenterology;  Laterality: Left;  ? ESOPHAGOGASTRODUODENOSCOPY (EGD) WITH ESOPHAGEAL DILATION  X 2  ? FOREIGN BODY REMOVAL  01/11/2019  ? FRACTURE SURGERY    ? HERNIA REPAIR    ? IMPACTION REMOVAL  10/04/2019  ? Procedure: IMPACTION REMOVAL;  Surgeon: Shellia Cleverly, DO;  Location: [facility] ENDOSCOPY;  Service: Gastroenterology;;  ? PATELLA FRACTURE SURGERY Left   ? POSTERIOR CERVICAL FUSION/FORAMINOTOMY    ? ?Social History:  reports that he quit smoking about 43 years ago. His smoking use included cigarettes. He has a 20.00 pack-year smoking history. He quit smokeless tobacco use about 38 years ago. He  reports that he does not drink alcohol and does not use drugs. ? ?Allergies  ?Allergen Reactions  ? Myrbetriq [Mirabegron] Other (See Comments)  ?  Unknown  ? ? ?Family History  ?Problem Relation Age of Onset  ? Polycythemia Mother   ? Lung cancer Father   ? Other Brother   ?     brain tumor  ? Diabetes Daughter   ? Colon cancer Neg Hx   ? Esophageal cancer Neg Hx   ? Rectal cancer Neg Hx   ? Stomach cancer Neg Hx   ? ? ?Prior to Admission medications   ?Medication Sig Start Date End Date Taking? Authorizing Provider  ?acetaminophen (TYLENOL) 325 MG tablet Take 650 mg by mouth every 4 (four)  hours as needed for mild pain or fever.   Yes [provider]  ?brimonidine (ALPHAGAN) 0.2 % ophthalmic solution Place 1 drop into the right eye 2 (two) times daily.  02/25/18  Yes [provider]  ?Insulin Glargine (BASAGLAR KWIKPEN) 100 UNIT/ML Inject 15 Units into the skin in the morning.   Yes [provider]  ?lansoprazole (PREVACID) 30 MG capsule Take 30 mg by mouth daily. 12/16/21  Yes [provider]  ?latanoprost (XALATAN) 0.005 % ophthalmic solution Place 1 drop into the right eye at bedtime.  06/02/16  Yes [provider]  ?lisinopril (ZESTRIL) 5 MG tablet Take 5 mg by mouth daily. 11/22/21  Yes [provider]  ?memantine (NAMENDA) 10 MG tablet Take 1 tablet (10 mg total) by mouth 2 (two) times daily. 08/24/18  Yes Micki Riley, MD  ?protein supplement (PROSOURCE NO CARB) LIQD Take 30 mLs by mouth in the morning and at bedtime.   Yes [provider]  ?QUEtiapine (SEROQUEL) 25 MG tablet Take 1 tablet (25 mg total) by mouth 3 (three) times daily. ?Patient taking differently: Take 50 mg by mouth 2 (two) times daily. 09/06/20  Yes Rolly Salter, MD  ?sennosides-docusate sodium (SENOKOT-S) 8.6-50 MG tablet Take 1 tablet by mouth in the morning and at bedtime.   Yes [provider]  ?sertraline (ZOLOFT) 25 MG tablet Take 25 mg by mouth daily. Take along with 50mg  tablet for total of 75mg  daily 12/19/21  Yes [provider]  ?sertraline (ZOLOFT) 50 MG tablet Take 50 mg by mouth daily. Take along with 25mg  tablet to total 75mg  daily 12/19/21  Yes [provider]  ?silodosin (RAPAFLO) 8 MG CAPS capsule Take 8 mg by mouth daily. 11/25/21  Yes [provider]  ?timolol (TIMOPTIC) 0.5 % ophthalmic solution Place 1 drop into the right eye 2 (two) times daily.   Yes [provider]  ?traMADol (ULTRAM) 50 MG tablet Take 50 mg by mouth every 6 (six) hours as needed for moderate pain.   Yes [provider]   ?traZODone (DESYREL) 50 MG tablet Take 75 mg by mouth at bedtime.   Yes [provider]  ?vitamin B-12 (CYANOCOBALAMIN) 1000 MCG tablet Take 1,000 mcg by mouth daily.   Yes [provider]  ?lisinopril (ZESTRIL) 2.5 MG tablet Take 1 tablet (2.5 mg total) by mouth daily. ?Patient not taking: Reported on 12/20/2021 09/06/20   12/21/21, MD  ?Maltodextrin-Xanthan Gum (RESOURCE [facility] CLEAR) POWD Use as needed ?Patient not taking: Reported on 12/20/2021 09/03/20   Rolly Salter, MD  ?sertraline (ZOLOFT) 100 MG tablet Take 1.5 tablets (150 mg total) by mouth daily. ?Patient not taking: Reported on 12/20/2021 09/06/20   09/05/20,  MD  ?tamsulosin (FLOMAX) 0.4 MG CAPS capsule Take 1 capsule (0.4 mg total) by mouth daily after supper. ?Patient not taking: Reported on 12/20/2021 09/03/20   Standley BrookingGoodrich, Daniel P, MD  ?sucralfate (CARAFATE) 1 GM/10ML suspension Take 10 mLs (1 g total) by mouth 4 (four) times daily. ?Patient not taking: Reported on 11/15/2019 10/04/19 04/14/20  Shellia Cleverlyirigliano, Vito V, DO  ? ? ?Physical Exam: ?Vitals:  ? 12/20/21 1530 12/20/21 1545 12/20/21 1600 12/20/21 1622  ?BP: (!) 145/77 138/69 (!) 145/69   ?Pulse: 79 73 75   ?Resp: (!) 22 (!) 24 20   ?Temp:    98.2 ?F (36.8 ?C)  ?TempSrc:    Oral  ?SpO2: 100% 96% 94%   ?Weight:      ?Height:      ? ?General:  Appears calm and comfortable and is in NAD. At baseline ?Eyes:  PERRL, EOMI, normal lids, iris ?ENT:  grossly normal hearing, lips & tongue, mmm; appropriate dentition ?Neck:  no LAD, masses or thyromegaly; no carotid bruits ?Cardiovascular:  RRR, no m/r/g. No LE edema.  ?Respiratory:   CTA bilaterally with no wheezes/rales/rhonchi.  Normal respiratory effort. ?Abdomen:  soft, NT, ND, NABS ?Back:   normal alignment, no CVAT ?Skin:  no rash or induration seen on limited exam ?Musculoskeletal: normal tone/movement of BUE ?RLE: bent, pedal pulses intact. Withdrawals foot/toes with stimuli. No bruising or swelling on skin. Does  not react to palpation.  ?Lower extremity:  No LE edema.  Limited foot exam with no ulcerations.  2+ distal pulses. ?Psychiatric:  nonverbal, at baseline.  ?Neurologic:  can not assess, at baseline per family  ? ?

## 2021-12-20 NOTE — ED Notes (Signed)
Pt transported to CT ?

## 2021-12-20 NOTE — Consult Note (Signed)
Reason for Consult:Right hip fx ?Referring Physician: Marianna Fussichard Dykstra ?Time called: 1201 ?Time at bedside: 1219 ? ? ?Daniel Morrow is an 81 y.o. male.  ?HPI: Daniel Morrow fell at the SNF where he resides. He was brought to the ED where x-rays showed a right hip fx and orthopedic surgery was consulted. He is severely demented, nonverbal, and a non-ambulator. ? ?Past Medical History:  ?Diagnosis Date  ? Aphagia   ? Arthritis   ? "knees" (09/14/2018)  ? Blind left eye   ? Chronic bronchitis (HCC)   ? Dementia (HCC)   ? Depression   ? GERD (gastroesophageal reflux disease)   ? Glaucoma   ? History of kidney stones   ? Hyperlipidemia   ? Hypertension   ? Pneumonia   ? "maybe twice" (09/14/2018)  ? Recurrent UTI (urinary tract infection)   ? Stroke [facility](H[facility]) 11/2011  ? "didn't effect him at the time" (09/14/2018)  ? Type II diabetes mellitus (HCC)   ? ? ?Past Surgical History:  ?Procedure Laterality Date  ? ABDOMINAL HERNIA REPAIR    ? CATARACT EXTRACTION W/ INTRAOCULAR LENS  IMPLANT, BILATERAL Bilateral   ? COLONOSCOPY    ? ESOPHAGOGASTRODUODENOSCOPY N/A 01/11/2019  ? Procedure: ESOPHAGOGASTRODUODENOSCOPY (EGD);  Surgeon: Meryl DareStark, Malcolm T, MD;  [facility] END[facility];  Service: Endoscopy;  Laterality: N/A;  ? ESOPHAGOGASTRODUODENOSCOPY Left 10/04/2019  ? Procedure: ESOPHAGOGASTRODUODENOSCOPY (EGD);  Surgeon: Shellia Cleverlyirigliano, Vito V, DO;  Location: [facility] ENDOSCOPY;  Service: Gastroenterology;  Laterality: Left;  ? ESOPHAGOGASTRODUODENOSCOPY (EGD) WITH ESOPHAGEAL DILATION  X 2  ? FOREIGN BODY REMOVAL  01/11/2019  ? FRACTURE SURGERY    ? HERNIA REPAIR    ? IMPACTION REMOVAL  10/04/2019  ? Procedure: IMPACTION REMOVAL;  Surgeon: Shellia Cleverlyirigliano, Vito V, DO;  Location: [facility] ENDOSCOPY;  Service: Gastroenterology;;  ? PATELLA FRACTURE SURGERY Left   ? POSTERIOR CERVICAL FUSION/FORAMINOTOMY    ? ? ?Family History  ?Problem Relation Age of Onset  ? Polycythemia Mother   ? Lung cancer Father   ? Other Brother   ?     brain tumor  ? Diabetes Daughter   ?  Colon cancer Neg Hx   ? Esophageal cancer Neg Hx   ? Rectal cancer Neg Hx   ? Stomach cancer Neg Hx   ? ? ?Social History:  reports that he quit smoking about 43 years ago. His smoking use included cigarettes. He has a 20.00 pack-year smoking history. He quit smokeless tobacco use about 38 years ago. He reports that he does not drink alcohol and does not use drugs. ? ?Allergies:  ?Allergies  ?Allergen Reactions  ? Myrbetriq [Mirabegron] Other (See Comments)  ? ? ?Medications: I have reviewed the patient's current medications. ? ?Results for orders placed or performed during the hospital encounter of 12/20/21 (from the past 48 hour(s))  ?Lactic acid, plasma     Status: None  ? Collection Time: 12/20/21 10:04 AM  ?Result Value Ref Range  ? Lactic Acid, Venous 1.2 0.5 - 1.9 mmol/L  ?  Comment: Performed at [facility], [address] [place], [place] [id number]  ?Comprehensive metabolic panel     Status: Abnormal  ? Collection Time: 12/20/21 10:04 AM  ?Result Value Ref Range  ? Sodium 140 135 - 145 mmol/L  ? Potassium 3.9 3.5 - 5.1 mmol/L  ? Chloride 105 98 - 111 mmol/L  ? CO2 28 22 - 32 mmol/L  ? Glucose, Bld 149 (H) 70 - 99 mg/dL  ?  Comment: Glucose reference  range applies only to samples taken after fasting for at least 8 hours.  ? BUN 25 (H) 8 - 23 mg/dL  ? Creatinine, Ser 1.24 0.61 - 1.24 mg/dL  ? Calcium 8.6 (L) 8.9 - 10.3 mg/dL  ? Total Protein 5.8 (L) 6.5 - 8.1 g/dL  ? Albumin 3.2 (L) 3.5 - 5.0 g/dL  ? AST 11 (L) 15 - 41 U/L  ? ALT 13 0 - 44 U/L  ? Alkaline Phosphatase 63 38 - 126 U/L  ? Total Bilirubin 1.2 0.3 - 1.2 mg/dL  ? GFR, Estimated 59 (L) >60 mL/min  ?  Comment: (NOTE) ?Calculated using the CKD-EPI Creatinine Equation (2021) ?  ? Anion gap 7 5 - 15  ?  Comment: Performed at [facility], [address], [place], [place] [address]  ?CBC WITH DIFFERENTIAL     Status: Abnormal  ? Collection Time: 12/20/21 10:04 AM  ?Result Value Ref Range  ? WBC 9.1 4.0 - 10.5 K/uL  ? RBC 4.22 4.22 - 5.81  MIL/uL  ? Hemoglobin 12.9 (L) 13.0 - 17.0 g/dL  ? HCT 39.4 39.0 - 52.0 %  ? MCV 93.4 80.0 - 100.0 fL  ? MCH 30.6 26.0 - 34.0 pg  ? MCHC 32.7 30.0 - 36.0 g/dL  ? RDW 13.8 11.5 - 15.5 %  ? Platelets 146 (L) 150 - 400 K/uL  ?  Comment: REPEATED TO VERIFY  ? nRBC 0.0 0.0 - 0.2 %  ? Neutrophils Relative % 77 %  ? Neutro Abs 6.9 1.7 - 7.7 K/uL  ? Lymphocytes Relative 10 %  ? Lymphs Abs 0.9 0.7 - 4.0 K/uL  ? Monocytes Relative 7 %  ? Monocytes Absolute 0.7 0.1 - 1.0 K/uL  ? Eosinophils Relative 5 %  ? Eosinophils Absolute 0.5 0.0 - 0.5 K/uL  ? Basophils Relative 1 %  ? Basophils Absolute 0.1 0.0 - 0.1 K/uL  ? Immature Granulocytes 0 %  ? Abs Immature Granulocytes 0.04 0.00 - 0.07 K/uL  ?  Comment: Performed at [facility], [address] [place], [place] [address]  ?Protime-INR     Status: None  ? Collection Time: 12/20/21 10:04 AM  ?Result Value Ref Range  ? Prothrombin Time 15.0 11.4 - 15.2 seconds  ? INR 1.2 0.8 - 1.2  ?  Comment: (NOTE) ?INR goal varies based on device and disease states. ?Performed at [facility], [address], [place], [place] ?[address] ?  ?APTT     Status: None  ? Collection Time: 12/20/21 10:04 AM  ?Result Value Ref Range  ? aPTT 27 24 - 36 seconds  ?  Comment: Performed at [facility], [address], [place], [place] [address]  ? ? ?DG Chest 1 View ? ?Result Date: 12/20/2021 ?CLINICAL DATA:  Fall EXAM: CHEST  1 VIEW COMPARISON:  Radiograph 02/01/2021 FINDINGS: Unchanged enlarged cardiac silhouette. There are bibasilar opacities, left greater than right. Low lung volumes with interstitial prominence. Probable trace right pleural effusion. No visible pneumothorax. No acute osseous abnormality. Thoracic spondylosis. IMPRESSION: Low lung volumes with bibasilar opacities which could be atelectasis or developing infection. Probable trace right pleural effusion. Electronically Signed   By: Caprice Renshaw M.D.   On: 12/20/2021 10:34   ? ?Review of Systems  ?Unable to perform ROS:  Patient nonverbal  ?Blood pressure (!) 108/56, pulse 61, temperature 99.9 ?F (37.7 ?C), temperature source Rectal, resp. rate 20, height 5\' 11"  (1.803 m), weight 74.5 kg,  SpO2 96 %. ?Physical Exam ?Constitutional:   ?   General: He is not in acute distress. ?   Appearance: He is well-developed. He is not diaphoretic.  ?HENT:  ?   Head: Normocephalic and atraumatic.  ?Eyes:  ?   General: No scleral icterus.    ?   Right eye: No discharge.     ?   Left eye: No discharge.  ?   Conjunctiva/sclera: Conjunctivae normal.  ?Cardiovascular:  ?   Rate and Rhythm: Normal rate and regular rhythm.  ?Pulmonary:  ?   Effort: Pulmonary effort is normal. No respiratory distress.  ?Musculoskeletal:  ?   Cervical back: Normal range of motion.  ?   Comments: RLE No traumatic wounds, ecchymosis, or rash ? Nontender ? No knee or ankle effusion ? Knee stable to varus/ valgus and anterior/posterior stress ? Sens DPN, SPN, TN unable to assess ? Motor EHL, ext, flex, evers unable to assess ? DP 2+, PT 1+, No significant edema  ?Skin: ?   General: Skin is warm and dry.  ?Psychiatric:  ?   Comments: Somnolent  ? ? ?Assessment/Plan: ?Right hip fx -- Plan initial non-operative management. Will see how he does through the weekend with turning and sitting. If unable to control patient's pain medically would consider palliative fixation early next week. Wife in agreement with plan. ? ? ? ?Freeman Caldron, PA-C ?Orthopedic Surgery ?[phone] ?12/20/2021, 12:30 PM  ?

## 2021-12-20 NOTE — Assessment & Plan Note (Addendum)
-   Plan to continue home meds ?

## 2021-12-20 NOTE — ED Provider Notes (Signed)
?[facility] EMERGENCY DEPARTMENT ?Provider Note ? ? ?CSN: [id number] ?Arrival date & time: 12/20/21  4332 ? ?  ? ?History ?Chief Complaint  ?Patient presents with  ? Hip Injury  ? Code Sepsis  ? ? ?Daniel Morrow is a 81 y.o. male that is nonverbal and nonambulatory to the emergency department from [facility] nursing home for right hip fracture.  History is partially obtained from the wife.  The patient had a fall from his bed at an unknown height 2 days ago.  Yesterday when transferring him to his wheelchair, he moaned when they moved his right leg.  An x-ray was obtained yesterday that showed a hip fracture.  The patient was also given 1 g of Rocephin for some trace leukocytes and 6-10 white blood cells seen on the information the patient was given.  No MAR was provided.  Unsure the patient is on a blood thinner.  The wife reports that he was on one previously but is not sure if he is on that now.  History is limited due to patient's nonverbal status. ? ?HPI ? ?  ? ?Home Medications ?Prior to Admission medications   ?Medication Sig Start Date End Date Taking? Authorizing Provider  ?acetaminophen (TYLENOL) 325 MG tablet Take 650 mg by mouth every 6 (six) hours as needed for mild pain, fever or headache.    [provider]  ?brimonidine (ALPHAGAN) 0.2 % ophthalmic solution Place 1 drop into the right eye 2 (two) times daily.  02/25/18   [provider]  ?cefTRIAXone (ROCEPHIN) 1 g injection Inject 1 g into the muscle daily. 12/19/21   [provider]  ?clonazePAM (KLONOPIN) 0.5 MG tablet Take 1 tablet (0.5 mg total) by mouth 2 (two) times daily as needed for anxiety. 09/06/20 09/06/21  Rolly Salter, MD  ?colesevelam [facility]) 625 MG tablet Take 1,875 mg by mouth 2 (two) times daily with a meal.  12/22/18   [provider]  ?insulin aspart (NOVOLOG) 100 UNIT/ML injection Inject 5 Units into the skin 3 (three) times daily with meals. 09/03/20   Standley Brooking, MD   ?insulin glargine (LANTUS) 100 UNIT/ML injection Inject 0.15 mLs (15 Units total) into the skin at bedtime. 09/06/20   Rolly Salter, MD  ?lansoprazole (PREVACID) 30 MG capsule Take 30 mg by mouth daily. 12/16/21   [provider]  ?latanoprost (XALATAN) 0.005 % ophthalmic solution Place 1 drop into the right eye at bedtime.  06/02/16   [provider]  ?lidocaine (XYLOCAINE) 1 % (with preservative) injection SMARTSIG:Rectally 12/19/21   [provider]  ?lisinopril (ZESTRIL) 2.5 MG tablet Take 1 tablet (2.5 mg total) by mouth daily. 09/06/20   Rolly Salter, MD  ?lisinopril (ZESTRIL) 5 MG tablet Take 5 mg by mouth daily. 11/22/21   [provider]  ?Maltodextrin-Xanthan Gum (RESOURCE THICKENUP CLEAR) POWD Use as needed 09/03/20   Standley Brooking, MD  ?memantine (NAMENDA) 10 MG tablet Take 1 tablet (10 mg total) by mouth 2 (two) times daily. 08/24/18   Micki , MD  ?OZEMPIC, 1 MG/DOSE, 4 MG/3ML SOPN Inject 1 mg into the skin once a week. Thursday 07/30/20   [provider]  ?pantoprazole (PROTONIX) 20 MG tablet Take 20 mg by mouth 2 (two) times daily.     [provider]  ?QUEtiapine (SEROQUEL) 25 MG tablet Take 1 tablet (25 mg total) by mouth 3 (three) times daily. 09/06/20   Rolly Salter, MD  ?sertraline (ZOLOFT)  100 MG tablet Take 1.5 tablets (150 mg total) by mouth daily. 09/06/20   Rolly SalterPatel, Pranav M, MD  ?sertraline (ZOLOFT) 25 MG tablet Take 25 mg by mouth daily. 12/19/21   [provider]  ?sertraline (ZOLOFT) 50 MG tablet Take 50 mg by mouth daily. 12/19/21   [provider]  ?silodosin (RAPAFLO) 8 MG CAPS capsule Take 8 mg by mouth daily. 11/25/21   [provider]  ?tamsulosin (FLOMAX) 0.4 MG CAPS capsule Take 1 capsule (0.4 mg total) by mouth daily after supper. 09/03/20   Standley BrookingGoodrich, Daniel P, MD  ?timolol (TIMOPTIC) 0.5 % ophthalmic solution Place 1 drop into the right eye 2 (two) times daily.    [provider]  ?traZODone (DESYREL) 100 MG tablet Take 100 mg by mouth at bedtime. 07/23/20   [provider]  ?vitamin B-12 (CYANOCOBALAMIN) 1000 MCG tablet Take 1,000 mcg by mouth daily.    [provider]  ?sucralfate (CARAFATE) 1 GM/10ML suspension Take 10 mLs (1 g total) by mouth 4 (four) times daily. ?Patient not taking: Reported on 11/15/2019 10/04/19 04/14/20  Shellia Cleverlyirigliano, Vito V, DO  ?   ? ?Allergies    ?Myrbetriq [mirabegron]   ? ?Review of Systems   ?Review of Systems  ?Unable to perform ROS: Patient nonverbal  ? ?Physical Exam ?Updated Vital Signs ?BP (!) 108/56   Pulse 61   Temp 99.9 ?F (37.7 ?C) (Rectal)   Resp 20   Ht 5\' 11"  (1.803 m)   Wt 74.5 kg   SpO2 96%   BMI 22.91 kg/m?  ?Physical Exam ?Constitutional:   ?   Comments: Frail, cachectic, chronically ill-appearing patient  ?HENT:  ?   Head:  ?   Comments: Patient has a small scabbed abrasion to the right eyebrow and a small soft hematoma to the right upper forehead.  No step-offs or deformities visualized or palpated along the scalp or face. ?   Mouth/Throat:  ?   Mouth: Mucous membranes are dry.  ?Eyes:  ?   General: No scleral icterus. ?   Pupils: Pupils are equal, round, and reactive to light.  ?Neck:  ?   Comments: Patient did not respond to any palpation of the neck.  Unable to test range of motion given the patient's condition. ?Cardiovascular:  ?   Rate and Rhythm: Normal rate and regular rhythm.  ?   Pulses: Normal pulses.  ?   Comments: Dopplerable DP and PT pulses bilaterally.   ?Pulmonary:  ?   Effort: Pulmonary effort is normal. No respiratory distress.  ?   Comments: Diminished breath sounds throughout.  The patient initially presented hypoxic in the 80s on room air.  He is gone up to 98% on 2 L. ?Abdominal:  ?   General: Bowel sounds are normal.  ?   Palpations: Abdomen is soft.  ?   Tenderness: There is no abdominal tenderness. There is no guarding.  ?   Comments: Patient did not respond to any palpation of the abdomen.   Normal active bowel sounds.  No overlying skin changes noted.  ?Musculoskeletal:  ?   Comments: Patient is moving both legs spontaneously.  Decreased muscle tone bilaterally.  Palpable and dopplerable DP and PT pulses bilaterally.  Patient has some response to palpation of the upper femur on the right.  Compartments are soft.  Unable to test sensation given patient's nonverbal status. ? ?No response with palpation of the midline or paraspinal back.  No signs of trauma to the  back.  No signs of erythema or overlying warmth noted as well.  No signs of trauma.  ?Skin: ?   General: Skin is warm and dry.  ?   Coloration: Skin is not jaundiced.  ?Neurological:  ?   Mental Status: Mental status is at baseline.  ?   Comments: Unable to assess, but patient is at baseline per family at bedside.  ? ? ?ED Results / Procedures / Treatments   ?Labs ?(all labs ordered are listed, but only abnormal results are displayed) ?Labs Reviewed  ?COMPREHENSIVE METABOLIC PANEL - Abnormal; Notable for the following components:  ?    Result Value  ? Glucose, Bld 149 (*)   ? BUN 25 (*)   ? Calcium 8.6 (*)   ? Total Protein 5.8 (*)   ? Albumin 3.2 (*)   ? AST 11 (*)   ? GFR, Estimated 59 (*)   ? All other components within normal limits  ?CBC WITH DIFFERENTIAL/PLATELET - Abnormal; Notable for the following components:  ? Hemoglobin 12.9 (*)   ? Platelets 146 (*)   ? All other components within normal limits  ?CULTURE, BLOOD (ROUTINE X 2)  ?CULTURE, BLOOD (ROUTINE X 2)  ?URINE CULTURE  ?LACTIC ACID, PLASMA  ?PROTIME-INR  ?APTT  ?URINALYSIS, ROUTINE W REFLEX MICROSCOPIC  ? ? ?EKG ?None ? ?Radiology ?DG Chest 1 View ? ?Result Date: 12/20/2021 ?CLINICAL DATA:  Fall EXAM: CHEST  1 VIEW COMPARISON:  Radiograph 02/01/2021 FINDINGS: Unchanged enlarged cardiac silhouette. There are bibasilar opacities, left greater than right. Low lung volumes with interstitial prominence. Probable trace right pleural effusion. No visible pneumothorax. No acute osseous  abnormality. Thoracic spondylosis. IMPRESSION: Low lung volumes with bibasilar opacities which could be atelectasis or developing infection. Probable trace right pleural effusion. Electronically Signed   By: Chipper Oman

## 2021-12-20 NOTE — Assessment & Plan Note (Addendum)
Currently on honey thick liquids/pureed diet and needs to be fed/supervised.  ?

## 2021-12-20 NOTE — Assessment & Plan Note (Addendum)
He is nonverbal, nonambulatory. No changes. ?

## 2021-12-20 NOTE — Assessment & Plan Note (Addendum)
-   Having good UOP. Continue rapaflo ?

## 2021-12-20 NOTE — Assessment & Plan Note (Signed)
Well controlled, continue lisinopril.

## 2021-12-20 NOTE — ED Notes (Signed)
Pt linens and brief changed per EMT and NT- placed on male external catheter. Antibiotics started.  ?

## 2021-12-20 NOTE — ED Notes (Signed)
Called CT to check on status of pt getting imaging completed. CT said they will get him next. ?

## 2021-12-20 NOTE — ED Notes (Addendum)
Called lab to check on status of d-dimer. Lab will now add on dimer to previous collection ?

## 2021-12-20 NOTE — ED Notes (Signed)
Pt transported to xray 

## 2021-12-20 NOTE — Progress Notes (Signed)
ANTICOAGULATION CONSULT NOTE - Initial Consult ? ?Pharmacy Consult for IV heparin  ?Indication: pulmonary embolus ? ?Allergies  ?Allergen Reactions  ? Myrbetriq [Mirabegron] Other (See Comments)  ?  Unknown  ? ? ?Patient Measurements: ?Height: 5\' 11"  (180.3 cm) ?Weight: 74.5 kg (164 lb 3.9 oz) ?IBW/kg (Calculated) : 75.3 ?Heparin Dosing Weight: 74.5 kg ? ?Vital Signs: ?Temp: 99.8 ?F (37.7 ?C) (03/31 2002) ?Temp Source: Oral (03/31 2002) ?BP: 159/82 (03/31 2002) ?Pulse Rate: 102 (03/31 2002) ? ?Labs: ?Recent Labs  ?  12/20/21 ?1004  ?HGB 12.9*  ?HCT 39.4  ?PLT 146*  ?APTT 27  ?LABPROT 15.0  ?INR 1.2  ?CREATININE 1.24  ? ? ?Estimated Creatinine Clearance: 50.9 mL/min (by C-G formula based on SCr of 1.24 mg/dL). ? ? ?Medical History: ?Past Medical History:  ?Diagnosis Date  ? Aphagia   ? Arthritis   ? "knees" (09/14/2018)  ? Blind left eye   ? Chronic bronchitis (HCC)   ? Dementia (HCC)   ? Depression   ? GERD (gastroesophageal reflux disease)   ? Glaucoma   ? History of kidney stones   ? Hyperlipidemia   ? Hypertension   ? Pneumonia   ? "maybe twice" (09/14/2018)  ? Recurrent UTI (urinary tract infection)   ? Stroke [facility]) 11/2011  ? "didn't effect him at the time" (09/14/2018)  ? Type II diabetes mellitus (HCC)   ? ? ?Medications:  ?Infusions:  ? azithromycin Stopped (12/20/21 1411)  ? [START ON 12/21/2021] cefTRIAXone (ROCEPHIN)  IV    ? ? ?Assessment: ?81 yo male admitted with hip fracture, now found with new PE.  Pharmacy asked to begin anticoagulation with IV heparin. ? ?No anticoagulants noted PTA. ? ?Goal of Therapy:  ?Heparin level 0.3-0.7 units/ml ?Monitor platelets by anticoagulation protocol: Yes ?  ?Plan:  ?Start IV heparin with bolus of 3500 x 1, then start gtt at 950 units/hr.   ?Check heparin level 8 hrs after gtt starts. ?Daily heparin level and CBC. ? ? ?76, Pharm D, BCPS, BCCP ?Clinical Pharmacist ? 12/20/2021 9:21 PM  ? ?[facility] pharmacy phone numbers are listed on [email or web address] ? ? ? ?

## 2021-12-20 NOTE — Assessment & Plan Note (Addendum)
s/p mechanical fall.  ?- Ortho consulted with plans for nonsurgical management in setting of advanced dementia. Pain control without palliative fixation is adequate and pt is bedbound regardless. Has not required opioids. ?

## 2021-12-20 NOTE — ED Triage Notes (Signed)
Pt arrived to ED via EMS from [facility] w/ c/o of R hip pain after fall yesterday w/ positive fracture on Xray, pt also tested positive for UTI at the facility and 1g rocephin yesterday. Pt nonverbal and nonambulatory at baseline. VSS w/ EMS. Pt arrived to ED soaked in urine.  ?

## 2021-12-20 NOTE — Assessment & Plan Note (Addendum)
Presenting to ED with oxygen of 85% on RA, no signs of distress. History of oxygen requirement in 2022, but per family was weaned from oxygen. Suspect due to PE, atelectasis (bed bound with bibasilar opacities). Pneumonia ruled out as he has no infection-specific signs or symptoms and PCT is undetectable. ?- Attempt IS if mentation would allow it. Has needed oxygen in the past, so may discharge with it if desaturating. ?

## 2021-12-20 NOTE — ED Notes (Signed)
Safety mitts placed on pt d/t pulling at lines and unable to follow commands. ?

## 2021-12-20 NOTE — Assessment & Plan Note (Addendum)
HbA1c 6.9%.  ?- Continue home glargine ?

## 2021-12-21 ENCOUNTER — Encounter (HOSPITAL_COMMUNITY): Payer: Self-pay | Admitting: Family Medicine

## 2021-12-21 ENCOUNTER — Inpatient Hospital Stay (HOSPITAL_COMMUNITY): Payer: Medicare HMO

## 2021-12-21 ENCOUNTER — Other Ambulatory Visit: Payer: Self-pay

## 2021-12-21 DIAGNOSIS — F0280 Dementia in other diseases classified elsewhere without behavioral disturbance: Secondary | ICD-10-CM

## 2021-12-21 DIAGNOSIS — I2699 Other pulmonary embolism without acute cor pulmonale: Secondary | ICD-10-CM

## 2021-12-21 DIAGNOSIS — S72001A Fracture of unspecified part of neck of right femur, initial encounter for closed fracture: Secondary | ICD-10-CM | POA: Diagnosis not present

## 2021-12-21 DIAGNOSIS — F32A Depression, unspecified: Secondary | ICD-10-CM

## 2021-12-21 DIAGNOSIS — G3109 Other frontotemporal dementia: Secondary | ICD-10-CM

## 2021-12-21 DIAGNOSIS — F419 Anxiety disorder, unspecified: Secondary | ICD-10-CM

## 2021-12-21 DIAGNOSIS — J9601 Acute respiratory failure with hypoxia: Secondary | ICD-10-CM

## 2021-12-21 DIAGNOSIS — Z9189 Other specified personal risk factors, not elsewhere classified: Secondary | ICD-10-CM

## 2021-12-21 DIAGNOSIS — I10 Essential (primary) hypertension: Secondary | ICD-10-CM

## 2021-12-21 DIAGNOSIS — E1165 Type 2 diabetes mellitus with hyperglycemia: Secondary | ICD-10-CM

## 2021-12-21 LAB — CBC
HCT: 36.9 % — ABNORMAL LOW (ref 39.0–52.0)
Hemoglobin: 11.9 g/dL — ABNORMAL LOW (ref 13.0–17.0)
MCH: 29.7 pg (ref 26.0–34.0)
MCHC: 32.2 g/dL (ref 30.0–36.0)
MCV: 92 fL (ref 80.0–100.0)
Platelets: 146 10*3/uL — ABNORMAL LOW (ref 150–400)
RBC: 4.01 MIL/uL — ABNORMAL LOW (ref 4.22–5.81)
RDW: 13.7 % (ref 11.5–15.5)
WBC: 7.1 10*3/uL (ref 4.0–10.5)
nRBC: 0 % (ref 0.0–0.2)

## 2021-12-21 LAB — BASIC METABOLIC PANEL
Anion gap: 7 (ref 5–15)
BUN: 18 mg/dL (ref 8–23)
CO2: 29 mmol/L (ref 22–32)
Calcium: 8.4 mg/dL — ABNORMAL LOW (ref 8.9–10.3)
Chloride: 105 mmol/L (ref 98–111)
Creatinine, Ser: 1.09 mg/dL (ref 0.61–1.24)
GFR, Estimated: >60 mL/min (ref >60)
Glucose, Bld: 182 mg/dL — ABNORMAL HIGH (ref 70–99)
Potassium: 4.1 mmol/L (ref 3.5–5.1)
Sodium: 141 mmol/L (ref 135–145)

## 2021-12-21 LAB — ECHOCARDIOGRAM COMPLETE
AR max vel: 2.21 cm2
AV Area VTI: 2.13 cm2
AV Area mean vel: 2.22 cm2
AV Mean grad: 5 mmHg
AV Peak grad: 8.1 mmHg
Ao pk vel: 1.42 m/s
Area-P 1/2: 1.86 cm2
Height: 71 in
MV VTI: 3.18 cm2
S' Lateral: 2.4 cm
Weight: 2627.88 oz

## 2021-12-21 LAB — PROCALCITONIN: Procalcitonin: <0.1 ng/mL

## 2021-12-21 LAB — HEMOGLOBIN A1C
Hgb A1c MFr Bld: 6.9 % — ABNORMAL HIGH (ref 4.8–5.6)
Mean Plasma Glucose: 151.33 mg/dL

## 2021-12-21 LAB — GLUCOSE, CAPILLARY
Glucose-Capillary: 176 mg/dL — ABNORMAL HIGH (ref 70–99)
Glucose-Capillary: 237 mg/dL — ABNORMAL HIGH (ref 70–99)
Glucose-Capillary: 148 mg/dL — ABNORMAL HIGH (ref 70–99)
Glucose-Capillary: 247 mg/dL — ABNORMAL HIGH (ref 70–99)

## 2021-12-21 LAB — URINE CULTURE: Culture: NO GROWTH

## 2021-12-21 LAB — STREP PNEUMONIAE URINARY ANTIGEN: Strep Pneumo Urinary Antigen: NEGATIVE

## 2021-12-21 LAB — HEPARIN LEVEL (UNFRACTIONATED)
Heparin Unfractionated: <0.1 IU/mL — ABNORMAL LOW (ref 0.30–0.70)
Heparin Unfractionated: 0.23 IU/mL — ABNORMAL LOW (ref 0.30–0.70)

## 2021-12-21 MED ORDER — ENOXAPARIN SODIUM 80 MG/0.8ML IJ SOSY
75.0000 mg | PREFILLED_SYRINGE | Freq: Two times a day (BID) | INTRAMUSCULAR | Status: DC
  Administered 2021-12-21 – 2021-12-22 (×2): 75 mg via SUBCUTANEOUS
  Filled 2021-12-21 (×2): qty 0.8

## 2021-12-21 MED ORDER — HEPARIN BOLUS VIA INFUSION
2000.0000 [IU] | Freq: Once | INTRAVENOUS | Status: AC
  Administered 2021-12-21: 2000 [IU] via INTRAVENOUS
  Filled 2021-12-21: qty 2000

## 2021-12-21 NOTE — Assessment & Plan Note (Addendum)
-   Converted parenteral anticoagulation to eliquis based on discussion with spouse.  ?- Echo with normal PASP. Mild RV dilatation noted, was present on last echo in 2017. Doubt from this and in setting of hemodynamic stability that this represents strain. ?

## 2021-12-21 NOTE — Progress Notes (Signed)
RN attempted to wake the patient up to feed him and can not get him to arouse, Lafonda Mosses, NT also attempted. Pt is breathing and moves some but just won't arouse. MD text paged and notified Vitals in the chart. ?

## 2021-12-21 NOTE — Progress Notes (Signed)
ANTICOAGULATION CONSULT NOTE  ? ?Pharmacy Consult for IV heparin  ?Indication: pulmonary embolus ? ?Allergies  ?Allergen Reactions  ? Myrbetriq [Mirabegron] Other (See Comments)  ?  Unknown  ? ? ?Patient Measurements: ?Height: 5\' 11"  (180.3 cm) ?Weight: 74.5 kg (164 lb 3.9 oz) ?IBW/kg (Calculated) : 75.3 ?Heparin Dosing Weight: 74.5 kg ? ?Vital Signs: ?Temp: 97.5 ?F (36.4 ?C) (04/01 0419) ?Temp Source: Oral (04/01 0419) ?BP: 124/54 (04/01 0419) ?Pulse Rate: 72 (04/01 0419) ? ?Labs: ?Recent Labs  ?  12/20/21 ?1004 12/21/21 ?02/20/22  ?HGB 12.9* 11.9*  ?HCT 39.4 36.9*  ?PLT 146* 146*  ?APTT 27  --   ?LABPROT 15.0  --   ?INR 1.2  --   ?HEPARINUNFRC  --  <0.10*  ?CREATININE 1.24 1.09  ? ? ? ?Estimated Creatinine Clearance: 57.9 mL/min (by C-G formula based on SCr of 1.09 mg/dL). ? ? ?Medical History: ?Past Medical History:  ?Diagnosis Date  ? Aphagia   ? Arthritis   ? "knees" (09/14/2018)  ? Blind left eye   ? Chronic bronchitis (HCC)   ? Dementia (HCC)   ? Depression   ? GERD (gastroesophageal reflux disease)   ? Glaucoma   ? History of kidney stones   ? Hyperlipidemia   ? Hypertension   ? Pneumonia   ? "maybe twice" (09/14/2018)  ? Recurrent UTI (urinary tract infection)   ? Stroke [facility]) 11/2011  ? "didn't effect him at the time" (09/14/2018)  ? Type II diabetes mellitus (HCC)   ? ? ?Medications:  ?Infusions:  ? azithromycin Stopped (12/20/21 1411)  ? cefTRIAXone (ROCEPHIN)  IV    ? heparin 950 Units/hr (12/21/21 0314)  ? ? ?Assessment: ?81 yo male admitted with hip fracture, now found with new PE.  Pharmacy asked to begin anticoagulation with IV heparin.No anticoagulants noted PTA. ? ?Heparin level undetectable: <0.10 ? ?Goal of Therapy:  ?Heparin level 0.3-0.7 units/ml ?Monitor platelets by anticoagulation protocol: Yes ?  ?Plan:  ?Heparin bolus 2000 units x 1 ?Increase heparin gtt to 1200 units/hr.   ?Check heparin level 8 hrs after gtt starts. ?Daily heparin level and CBC. ? ? ?76, PharmD ?Clinical  Pharmacist ?12/21/2021 6:28 AM ?Please check AMION for all [facility] Pharmacy numbers ? ? ? ? ?

## 2021-12-21 NOTE — Progress Notes (Signed)
Transition of Care (TOC) - CAGE-AID Screening ? ? ?Patient Details  ?Name: Daniel Morrow ?MRN: [id number] ?Date of Birth: [birth year] ? ?Transition of Care (TOC) CM/SW Contact:    ?Janora Norlander, RN ?Phone Number: ?12/21/2021, 7:43 PM ? ? ?Clinical Narrative: ?Pt is nonverbal at baseline and has dementia, therefore he is unable to participate in screening. ? ? ?CAGE-AID Screening: ?Substance Abuse Screening unable to be completed due to: : Patient unable to participate ? ?  ?  ?  ?  ?  ? ?  ? ?  ? ? ? ? ? ? ?

## 2021-12-21 NOTE — Plan of Care (Signed)

## 2021-12-21 NOTE — Evaluation (Signed)
Clinical/Bedside Swallow Evaluation ?Patient Details  ?Name: Daniel Morrow ?MRN: [id number] ?Date of Birth: [birth year] ? ?Today's Date: 12/21/2021 ?Time: SLP Start Time (ACUTE ONLY): [id number] SLP Stop Time (ACUTE ONLY): 1520 ?SLP Time Calculation (min) (ACUTE ONLY): 25 min ? ?Past Medical History:  ?Past Medical History:  ?Diagnosis Date  ? Aphagia   ? Arthritis   ? "knees" (09/14/2018)  ? Blind left eye   ? Chronic bronchitis ([place])   ? Dementia ([place])   ? Depression   ? GERD (gastroesophageal reflux disease)   ? Glaucoma   ? History of kidney stones   ? Hyperlipidemia   ? Hypertension   ? Pneumonia   ? "maybe twice" (09/14/2018)  ? Recurrent UTI (urinary tract infection)   ? Stroke [facility]) 11/2011  ? "didn't effect him at the time" (09/14/2018)  ? Type II diabetes mellitus ([place])   ? ?Past Surgical History:  ?Past Surgical History:  ?Procedure Laterality Date  ? ABDOMINAL HERNIA REPAIR    ? CATARACT EXTRACTION W/ INTRAOCULAR LENS  IMPLANT, BILATERAL Bilateral   ? COLONOSCOPY    ? ESOPHAGOGASTRODUODENOSCOPY N/A 01/11/2019  ? Procedure: ESOPHAGOGASTRODUODENOSCOPY (EGD);  Surgeon: Ladene Artist, MD;  Location: [facility];  Service: Endoscopy;  Laterality: N/A;  ? ESOPHAGOGASTRODUODENOSCOPY Left 10/04/2019  ? Procedure: ESOPHAGOGASTRODUODENOSCOPY (EGD);  Surgeon: Lavena Bullion, DO;  Location: [facility] ENDOSCOPY;  Service: Gastroenterology;  Laterality: Left;  ? ESOPHAGOGASTRODUODENOSCOPY (EGD) WITH ESOPHAGEAL DILATION  X 2  ? FOREIGN BODY REMOVAL  01/11/2019  ? FRACTURE SURGERY    ? HERNIA REPAIR    ? IMPACTION REMOVAL  10/04/2019  ? Procedure: IMPACTION REMOVAL;  Surgeon: Lavena Bullion, DO;  Location: [facility] ENDOSCOPY;  Service: Gastroenterology;;  ? PATELLA FRACTURE SURGERY Left   ? POSTERIOR CERVICAL FUSION/FORAMINOTOMY    ? ?HPI:  ?81 y.o. male with medical history significant of HTN, HLD, chronic bronchitis, recurrent UTI, hx of CVA, T2DM,  dementia with fall on Wednesday with no imaging at SNF. He had some grimacing and  they did xray yesterday which showed hip fracture and sent him to ER today. 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 He also does not walk. Per chart report, non operative conservative management of hip fracture due to advanced dementia.  ?  ?Assessment / Plan / Recommendation  ?Clinical Impression ? Pt with hx of chronic dysphagia per chart review; most recent MBSS 08/2020 with puree, honey thick liquid baseline diet. Per RN, pts spouse was present this morning and will return this evening; no family at bedside and pt primarily nonverbal due to advanced dementia. Pt was initially lethargic at bedside, however following oral care by SLP and cueing pt exhibited reflexive swallows to POs when fed by SLP consistently x10 without overt s/sx of aspiration. Pt does have a hx of silent aspiration with thin and nectar thick liquids per MBSS 2021.  Pt did exhibit some fatigue and lower endurance and at end of clinical interaction started to hold POs in mouth. With additional multimodal cueing and time pt was able to clear oral cavity of residuals. Recommend continue dysphagia 1 (puree) and honey thick liquids with full supervision from staff. Recommend diligent oral care BID. Will follow up for caregiver training to educate spouse upon strategies to reduce aspiration risk and maximize quality of life. ? ?SLP Visit Diagnosis: Dysphagia, oropharyngeal phase (R13.12) ?   ?Aspiration Risk ? Moderate aspiration risk;Mild aspiration risk  ?  ?Diet Recommendation   Dysphagia 1 (puree); honey thick liquids ? ?  Medication Administration: Crushed with puree  ?  ?Other  Recommendations Oral Care Recommendations: Oral care BID;Oral care before and after PO ?Other Recommendations: Order thickener from pharmacy   ? ?Recommendations for follow up therapy are one component of a multi-disciplinary discharge planning process, led by the attending physician.  Recommendations may be updated based on patient status, additional  functional criteria and insurance authorization. ? ?Follow up Recommendations Skilled nursing-short term rehab (<3 hours/day)  ? ? ?  ?Assistance Recommended at Discharge Frequent or constant Supervision/Assistance  ?Functional Status Assessment Patient has had a recent decline in their functional status and/or demonstrates limited ability to make significant improvements in function in a reasonable and predictable amount of time  ?Frequency and Duration min 1 x/week  ?1 week ?  ?   ? ?Prognosis Prognosis for Safe Diet Advancement: Fair ?Barriers to Reach Goals: Severity of deficits;Cognitive deficits  ? ?  ? ?Swallow Study   ?General Date of Onset: 12/20/21 ?HPI: 81 y.o. male with medical history significant of HTN, HLD, chronic bronchitis, recurrent UTI, hx of CVA, T2DM,  dementia with fall on Wednesday with no imaging at SNF. He had some grimacing and they did xray yesterday which showed hip fracture and sent him to ER today. 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 He also does not walk. Per chart report, non operative conservative management of hip fracture due to advanced dementia. ?Type of Study: Bedside Swallow Evaluation ?Previous Swallow Assessment: MBSS 08/2020; puree honey thick ?Diet Prior to this Study: Dysphagia 1 (puree);Honey-thick liquids ?Temperature Spikes Noted: No ?Respiratory Status: Room air ?History of Recent Intubation: No ?Behavior/Cognition: Lethargic/Drowsy;Doesn't follow directions;Requires cueing ?Oral Cavity Assessment: Dry ?Oral Care Completed by SLP: Yes ?Oral Cavity - Dentition: Missing dentition (lower edentulous, missing upper molars) ?Vision: Impaired for self-feeding ?Self-Feeding Abilities: Total assist ?Patient Positioning: Upright in bed ?Baseline Vocal Quality: Not observed (per chart review, pt mostly nonverbal due to advanced dementia) ?Volitional Cough: Cognitively unable to elicit ?Volitional Swallow: Unable to elicit  ?  ?Oral/Motor/Sensory Function Overall  Oral Motor/Sensory Function: Generalized oral weakness   ?Ice Chips Ice chips: Impaired ?Presentation: Spoon ?Oral Phase Impairments: Reduced lingual movement/coordination;Impaired mastication ?Oral Phase Functional Implications: Prolonged oral transit ?Pharyngeal Phase Impairments: Suspected delayed Swallow;Multiple swallows   ?Thin Liquid Thin Liquid: Not tested  ?  ?Nectar Thick Nectar Thick Liquid: Not tested   ?Honey Thick Honey Thick Liquid: Impaired ?Presentation: Cup ?Oral Phase Impairments: Reduced lingual movement/coordination ?Oral Phase Functional Implications: Oral holding;Prolonged oral transit ?Pharyngeal Phase Impairments: Suspected delayed Swallow;Multiple swallows   ?Puree Puree: Impaired ?Presentation: Spoon ?Oral Phase Impairments: Reduced lingual movement/coordination;Impaired mastication ?Oral Phase Functional Implications: Oral residue;Prolonged oral transit;Oral holding ?Pharyngeal Phase Impairments: Suspected delayed Swallow;Multiple swallows   ?Solid ? ? ?  Solid: Not tested  ? ?  ? ?[place], CCC-SLP ?Acute Rehabilitation Services  ? ?12/21/2021,3:33 PM ? ? ? ?

## 2021-12-21 NOTE — Progress Notes (Signed)
*  PRELIMINARY RESULTS* ?Echocardiogram ?2D Echocardiogram has been performed. ? ?Daniel Morrow ?12/21/2021, 12:10 PM ?

## 2021-12-21 NOTE — Progress Notes (Signed)
Initial Nutrition Assessment ? ?DOCUMENTATION CODES:  ? ?Not applicable ? ?INTERVENTION:  ? ?- d/c ProSource Plus as it is not honey-thick and not easily thickened to honey-thick consistency ? ?- Magic Cup TID with meals, each supplement provides 290 kcal and 9 grams of protein ? ?- Double protein portions TID with meals ? ?- Encourage PO intake and provide feeding assistance ? ?NUTRITION DIAGNOSIS:  ? ?Increased nutrient needs related to hip fracture as evidenced by estimated needs. ? ?GOAL:  ? ?Patient will meet greater than or equal to 90% of their needs ? ?MONITOR:  ? ?PO intake, Supplement acceptance, Labs, Weight trends ? ?REASON FOR ASSESSMENT:  ? ?Consult ?Hip fracture protocol ? ?ASSESSMENT:  ? ?81 year old male who presented to the ED from SNF on 3/31 after a fall. PMH of severe dementia (baseline nonverbal and nonambulatory), HTN, HLD, recurrent UTI, CVA, T2DM. Pt admitted with R hip fx. ? ?RD working remotely. Unable to obtain diet and weight history at this time. ? ?Per notes, Orthopedics recommending non-operative management due to advanced dementia and nonambulatory status. ? ?Reviewed weight history in chart. Weight on admission appears to be copied over from 08/23/20 encounter so unsure of accuracy. If accurate, pt's weight has been stable since December 2021. ? ?Meal Completion: 90-100% ? ?Medications reviewed and include: ProSource Plus 30 ml BID, basaglar 8 units daily, SSI, protonix, senna, IV abx, heparin drip ? ?Labs reviewed: platelets 146, hemoglobin A1C 6.9 ?CBG's: 133-237 x 24 hours ? ?NUTRITION - FOCUSED PHYSICAL EXAM: ? ?Unable to complete at this time. RD working remotely. ? ?Diet Order:   ?Diet Order   ? ?       ?  DIET - DYS 1 Room service appropriate? Yes with Assist; Fluid consistency: Honey Thick  Diet effective now       ?  ? ?  ?  ? ?  ? ? ?EDUCATION NEEDS:  ? ?Not appropriate for education at this time ? ?Skin:  Skin Assessment: Reviewed RN Assessment ? ?Last BM:  no documented  BM ? ?Height:  ? ?Ht Readings from Last 1 Encounters:  ?12/20/21 5\' 11"  (1.803 m)  ? ? ?Weight:  ? ?Wt Readings from Last 1 Encounters:  ?12/20/21 74.5 kg  ? ? ?BMI:  Body mass index is 22.91 kg/m?. ? ?Estimated Nutritional Needs:  ? ?Kcal:  1600-1800 ? ?Protein:  80-95 grams ? ?Fluid:  1.6-1.8 L ? ? ? ?12/22/21, MS, RD, LDN ?Inpatient Clinical Dietitian ?Please see AMiON for contact information. ? ?

## 2021-12-21 NOTE — Progress Notes (Signed)
Orthopedic Tech Progress Note ?Patient Details:  ?Daniel Morrow ?06-Feb-1941 ?[id number] ? ?Overhead frame not permitted due to age restrictions, pt age > 60. ? ?Patient ID: Daniel Morrow, male   DOB: 12-Jun-1941, 81 y.o.   MRN: [id number] ? ?Daniel Morrow ?12/21/2021, 8:06 AM ? ?

## 2021-12-21 NOTE — Progress Notes (Addendum)
ANTICOAGULATION CONSULT NOTE  ? ?Pharmacy Consult for IV heparin>>Lovenox ?Indication: pulmonary embolus ? ?Allergies  ?Allergen Reactions  ? Myrbetriq [Mirabegron] Other (See Comments)  ?  Unknown  ? ? ?Patient Measurements: ?Height: 5\' 11"  (180.3 cm) ?Weight: 74.5 kg (164 lb 3.9 oz) ?IBW/kg (Calculated) : 75.3 ?Heparin Dosing Weight: 74.5 kg ? ?Vital Signs: ?Temp: 98.1 ?F (36.7 ?C) (04/01 1351) ?Temp Source: Oral (04/01 1351) ?BP: 110/50 (04/01 1430) ?Pulse Rate: 66 (04/01 1351) ? ?Labs: ?Recent Labs  ?  12/20/21 ?1004 12/21/21 ?[id number] 12/21/21 ?1525  ?HGB 12.9* 11.9*  --   ?HCT 39.4 36.9*  --   ?PLT 146* 146*  --   ?APTT 27  --   --   ?LABPROT 15.0  --   --   ?INR 1.2  --   --   ?HEPARINUNFRC  --  <0.10* 0.23*  ?CREATININE 1.24 1.09  --   ? ? ? ?Estimated Creatinine Clearance: 57.9 mL/min (by C-G formula based on SCr of 1.09 mg/dL). ? ? ?Medical History: ?Past Medical History:  ?Diagnosis Date  ? Aphagia   ? Arthritis   ? "knees" (09/14/2018)  ? Blind left eye   ? Chronic bronchitis ([place])   ? Dementia ([place])   ? Depression   ? GERD (gastroesophageal reflux disease)   ? Glaucoma   ? History of kidney stones   ? Hyperlipidemia   ? Hypertension   ? Pneumonia   ? "maybe twice" (09/14/2018)  ? Recurrent UTI (urinary tract infection)   ? Stroke [facility]) 11/2011  ? "didn't effect him at the time" (09/14/2018)  ? Type II diabetes mellitus ([place])   ? ? ?Medications:  ?Infusions:  ? heparin 1,200 Units/hr (12/21/21 1514)  ? ? ?Assessment: ?80 yo male admitted with hip fracture, now found with new PE.  Pharmacy asked to begin anticoagulation with IV heparin.No anticoagulants noted PTA. ? ?HL still came back low at 0.23. We will increase rate for now. Will discuss with MD about using lovenox until he is ready for NOAC.  ? ?Addendum ? ?Ok to change to Lovenox ? ?Goal of Therapy:  ?Heparin level 0.3-0.7 units/ml ?Monitor platelets by anticoagulation protocol: Yes ?  ?Plan:  ? ?Heparin>>Lovenox 75mg  SQ BID ?F/u with NOAC ? ?Onnie Boer,  PharmD, BCIDP, AAHIVP, CPP ?Infectious Disease Pharmacist ?12/21/2021 3:54 PM ? ? ? ? ? ? ?

## 2021-12-21 NOTE — Progress Notes (Signed)
?Progress Note ? ?Patient: Daniel LamerBrantley L Morrow MVH:[id number] DOB: 03/01/1941  ?DOA: 12/20/2021  DOS: 12/21/2021  ?  ?Brief hospital course: ?Daniel Morrow is a 81 y.o. male with a history of dementia who is nonverbal and bed bound at baseline, hx CVA, T2DM, HTN, HLD, chronic bronchitis who [facility], found to have a right hip fracture by XR and ultimately sent to the ED 3/31. He had been given ceftriaxone for positive UA at SNF. Here he was afebrile with normal WBC. He was hypoxic and found to have a pulmonary embolism. Due to concern for aspiration pneumonia risk, antibiotics for pneumonia were initiated, though ultimately discontinued. IV heparin has been given. Orthopedics recommended nonoperative management as long as pain can be controlled medically. Would consider palliative fixation if not. ? ?Assessment and Plan: ?* Fracture of femoral neck, right (HCC) ?s/p mechanical fall.  ?- Ortho consulted with plans for nonsurgical management in setting of advanced dementia. If has pain with transfer would consider operation for palliative means.  ?- Pain control with nonsedating meds if able. No need for opioids at this time. ? ?Acute respiratory failure with hypoxia (HCC) ?Presenting to ED with oxygen of 85% on RA, no signs of distress. History of oxygen requirement in 2022, but per family was weaned from oxygen. Suspect due to PE, atelectasis (bed bound with bibasilar opacities). Pneumonia ruled out as he has no infection-specific signs or symptoms and PCT is undetectable. ?- Wean oxygen as tolerated with anticoagulation. Attempt IS if mentation would allow it. ? ?At high risk for aspiration ?Currently on honey thick liquids/pureed diet and needs to be fed/supervised. Hold po if lethargic. ? ?Frontotemporal dementia (HCC) ?He is nonverbal, nonambulatory. Currently somnolent after seroquel given.  ?- Continue namenda, delirium precautions. Family at bedside as much as is reasonable. ? ?Type II diabetes mellitus  (HCC) ?HbA1c 6.9%.  ?- Continue reduced dose basal insulin and SSI. ? ?Anxiety and depression ?- Continue zoloft, trazodone.  ?- Pt lethargic after seroquel this morning, will hold further dosing for now. Neuro exam incomplete, and pt on anticoagulation. If not waking up, would need CT head. ? ?Hypertension ?Well controlled, continue lisinopril  ? ?BPH (benign prostatic hyperplasia) ?Check RVR. Continue flomax (formulary sub for rapaflo) ? ?Acute pulmonary embolism (HCC) ?- Continue heparin gtt, can convert to DOAC 4/2 if stable.  ?- Check echo ? ?Subjective: Pt has been sleeping most of the day, when seen this morning wife was at bedside. No bleeding reported. Pt nonverbal, grunts but is otherwise minimally responsive. ? ?Objective: ?Vitals:  ? 12/21/21 0419 12/21/21 0735 12/21/21 1351 12/21/21 1430  ?BP: (!) 124/54 136/68 (!) 99/52 (!) 110/50  ?Pulse: 72 72 66   ?Resp: 20 17 18    ?Temp: (!) 97.5 ?F (36.4 ?C) 98.3 ?F (36.8 ?C) 98.1 ?F (36.7 ?C)   ?TempSrc: Oral Oral Oral   ?SpO2: 95% 95% 95%   ?Weight:      ?Height:      ? ?Gen: Elderly, frail male in no distress ?Pulm: Nonlabored breathing 2L O2. Clear, laying back with mouth open ?CV: Regular rate and rhythm. No murmur, rub, or gallop. No JVD, no dependent edema. ?GI: Abdomen soft, non-tender, non-distended, with normoactive bowel sounds.  ?Ext: Warm, slight increase in LE tone, legs crossed without deformities ?Skin: No rashes, lesions or ulcers on visualized skin. ?Neuro: Not oriented or cooperative with exam. Left pupil is mildly irregular, otherwise reactive bilaterally. ?Psych: Judgement and insight appear impaired   ? ?Data Personally  reviewed: ? ?CBC: ?Recent Labs  ?Lab 12/20/21 ?1004 12/21/21 ?0017  ?WBC 9.1 7.1  ?NEUTROABS 6.9  --   ?HGB 12.9* 11.9*  ?HCT 39.4 36.9*  ?MCV 93.4 92.0  ?PLT 146* 146*  ? ?Basic Metabolic Panel: ?Recent Labs  ?Lab 12/20/21 ?1004 12/21/21 ?4944  ?NA 140 141  ?K 3.9 4.1  ?CL 105 105  ?CO2 28 29  ?GLUCOSE 149* 182*  ?BUN 25* 18   ?CREATININE 1.24 1.09  ?CALCIUM 8.6* 8.4*  ? ?GFR: ?Estimated Creatinine Clearance: 57.9 mL/min (by C-G formula based on SCr of 1.09 mg/dL). ?Liver Function Tests: ?Recent Labs  ?Lab 12/20/21 ?1004  ?AST 11*  ?ALT 13  ?ALKPHOS 63  ?BILITOT 1.2  ?PROT 5.8*  ?ALBUMIN 3.2*  ? ?No results for input(s): LIPASE, AMYLASE in the last 168 hours. ?No results for input(s): AMMONIA in the last 168 hours. ?Coagulation Profile: ?Recent Labs  ?Lab 12/20/21 ?1004  ?INR 1.2  ? ?Cardiac Enzymes: ?No results for input(s): CKTOTAL, CKMB, CKMBINDEX, TROPONINI in the last 168 hours. ?BNP (last 3 results) ?No results for input(s): PROBNP in the last 8760 hours. ?HbA1C: ?Recent Labs  ?  12/21/21 ?9675  ?HGBA1C 6.9*  ? ?CBG: ?Recent Labs  ?Lab 12/20/21 ?1604 12/21/21 ?0736 12/21/21 ?1148  ?GLUCAP 133* 176* 237*  ? ?Lipid Profile: ?No results for input(s): CHOL, HDL, LDLCALC, TRIG, CHOLHDL, LDLDIRECT in the last 72 hours. ?Thyroid Function Tests: ?No results for input(s): TSH, T4TOTAL, FREET4, T3FREE, THYROIDAB in the last 72 hours. ?Anemia Panel: ?No results for input(s): VITAMINB12, FOLATE, FERRITIN, TIBC, IRON, RETICCTPCT in the last 72 hours. ?Urine analysis: ?   ?Component Value Date/Time  ? COLORURINE YELLOW 12/20/2021 1414  ? APPEARANCEUR CLEAR 12/20/2021 1414  ? LABSPEC 1.025 12/20/2021 1414  ? PHURINE 5.0 12/20/2021 1414  ? GLUCOSEU 50 (A) 12/20/2021 1414  ? HGBUR NEGATIVE 12/20/2021 1414  ? BILIRUBINUR NEGATIVE 12/20/2021 1414  ? KETONESUR NEGATIVE 12/20/2021 1414  ? PROTEINUR NEGATIVE 12/20/2021 1414  ? UROBILINOGEN 0.2 12/02/2013 0611  ? NITRITE NEGATIVE 12/20/2021 1414  ? LEUKOCYTESUR SMALL (A) 12/20/2021 1414  ? ?Recent Results (from the past 240 hour(s))  ?Urine Culture     Status: None  ? Collection Time: 12/20/21 12:08 PM  ? Specimen: In/Out Cath Urine  ?Result Value Ref Range Status  ? Specimen Description IN/OUT CATH URINE  Final  ? Special Requests NONE  Final  ? Culture   Final  ?  NO GROWTH ?Performed at [facility], [address] [place], [place] [address] ?  ? Report Status 12/21/2021 FINAL  Final  ?   ?DG Chest 1 View ? ?Result Date: 12/20/2021 ?CLINICAL DATA:  Fall EXAM: CHEST  1 VIEW COMPARISON:  Radiograph 02/01/2021 FINDINGS: Unchanged enlarged cardiac silhouette. There are bibasilar opacities, left greater than right. Low lung volumes with interstitial prominence. Probable trace right pleural effusion. No visible pneumothorax. No acute osseous abnormality. Thoracic spondylosis. IMPRESSION: Low lung volumes with bibasilar opacities which could be atelectasis or developing infection. Probable trace right pleural effusion. Electronically Signed   By: Caprice Renshaw M.D.   On: 12/20/2021 10:34  ? ?DG Pelvis 1-2 Views ? ?Result Date: 12/20/2021 ?CLINICAL DATA:  Fall, hip fracture EXAM: PELVIS - 1-2 VIEW COMPARISON:  CT abdomen/pelvis 11/14/2013 FINDINGS: There is an impacted fracture of the right femoral neck with medial angulation of the distal femur. Femoroacetabular alignment is maintained. There is no fracture on the left. The SI joints and symphysis pubis appear intact. IMPRESSION: Impacted fracture  of the right femoral neck. Electronically Signed   By: Lesia Hausen M.D.   On: 12/20/2021 10:35  ? ?CT Head Wo Contrast ? ?Result Date: 12/20/2021 ?CLINICAL DATA:  Trauma EXAM: CT HEAD WITHOUT CONTRAST TECHNIQUE: Contiguous axial images were obtained from the base of the skull through the vertex without intravenous contrast. RADIATION DOSE REDUCTION: This exam was performed according to the departmental dose-optimization program which includes automated exposure control, adjustment of the mA and/or kV according to patient size and/or use of iterative reconstruction technique. COMPARISON:  02/01/2021 FINDINGS: Brain: No acute intracranial findings are seen. Cortical sulci are prominent. There is decreased density in the periventricular and subcortical white matter. There is subtle decreased density in the right  cerebellum. This may suggest encephalomalacia from previous infarction. Vascular: Scattered arterial calcifications are seen. Skull: No fracture is seen in the calvarium. Sinuses/Orbits: Mucosal thickening is seen i

## 2021-12-22 DIAGNOSIS — J9601 Acute respiratory failure with hypoxia: Secondary | ICD-10-CM | POA: Diagnosis not present

## 2021-12-22 DIAGNOSIS — F419 Anxiety disorder, unspecified: Secondary | ICD-10-CM | POA: Diagnosis not present

## 2021-12-22 DIAGNOSIS — I2699 Other pulmonary embolism without acute cor pulmonale: Secondary | ICD-10-CM | POA: Diagnosis not present

## 2021-12-22 DIAGNOSIS — S72001A Fracture of unspecified part of neck of right femur, initial encounter for closed fracture: Secondary | ICD-10-CM | POA: Diagnosis not present

## 2021-12-22 LAB — CBC
HCT: 35.6 % — ABNORMAL LOW (ref 39.0–52.0)
Hemoglobin: 11.8 g/dL — ABNORMAL LOW (ref 13.0–17.0)
MCH: 30.6 pg (ref 26.0–34.0)
MCHC: 33.1 g/dL (ref 30.0–36.0)
MCV: 92.2 fL (ref 80.0–100.0)
Platelets: 154 10*3/uL (ref 150–400)
RBC: 3.86 MIL/uL — ABNORMAL LOW (ref 4.22–5.81)
RDW: 13.8 % (ref 11.5–15.5)
WBC: 6.4 10*3/uL (ref 4.0–10.5)
nRBC: 0 % (ref 0.0–0.2)

## 2021-12-22 LAB — GLUCOSE, CAPILLARY
Glucose-Capillary: 182 mg/dL — ABNORMAL HIGH (ref 70–99)
Glucose-Capillary: 189 mg/dL — ABNORMAL HIGH (ref 70–99)
Glucose-Capillary: 302 mg/dL — ABNORMAL HIGH (ref 70–99)
Glucose-Capillary: 205 mg/dL — ABNORMAL HIGH (ref 70–99)
Glucose-Capillary: 289 mg/dL — ABNORMAL HIGH (ref 70–99)

## 2021-12-22 LAB — COMPREHENSIVE METABOLIC PANEL
ALT: 12 U/L (ref 0–44)
AST: 10 U/L — ABNORMAL LOW (ref 15–41)
Albumin: 2.7 g/dL — ABNORMAL LOW (ref 3.5–5.0)
Alkaline Phosphatase: 52 U/L (ref 38–126)
Anion gap: 6 (ref 5–15)
BUN: 23 mg/dL (ref 8–23)
CO2: 28 mmol/L (ref 22–32)
Calcium: 8.4 mg/dL — ABNORMAL LOW (ref 8.9–10.3)
Chloride: 106 mmol/L (ref 98–111)
Creatinine, Ser: 1.07 mg/dL (ref 0.61–1.24)
GFR, Estimated: >60 mL/min (ref >60)
Glucose, Bld: 193 mg/dL — ABNORMAL HIGH (ref 70–99)
Potassium: 4 mmol/L (ref 3.5–5.1)
Sodium: 140 mmol/L (ref 135–145)
Total Bilirubin: 0.3 mg/dL (ref 0.3–1.2)
Total Protein: 5.5 g/dL — ABNORMAL LOW (ref 6.5–8.1)

## 2021-12-22 MED ORDER — APIXABAN 5 MG PO TABS: 5.0000 mg | ORAL_TABLET | Freq: Two times a day (BID) | ORAL | Status: DC

## 2021-12-22 MED ORDER — APIXABAN 5 MG PO TABS
10.0000 mg | ORAL_TABLET | Freq: Two times a day (BID) | ORAL | Status: DC
Start: 2021-12-22 — End: 2021-12-23
  Administered 2021-12-22 – 2021-12-23 (×2): 10 mg via ORAL
  Filled 2021-12-22 (×2): qty 2

## 2021-12-22 MED ORDER — TRAMADOL HCL 50 MG PO TABS: 50.0000 mg | ORAL_TABLET | Freq: Four times a day (QID) | ORAL | Status: DC | PRN

## 2021-12-22 MED ORDER — ACETAMINOPHEN 325 MG PO TABS
650.0000 mg | ORAL_TABLET | Freq: Four times a day (QID) | ORAL | Status: DC | PRN
Start: 2021-12-22 — End: 2021-12-23
  Administered 2021-12-22: 650 mg via ORAL
  Filled 2021-12-22: qty 2

## 2021-12-22 NOTE — Progress Notes (Signed)
ANTICOAGULATION CONSULT NOTE  ? ?Pharmacy Consult for IV heparin>>Lovenox>> apixaban  ?Indication: pulmonary embolus ? ?Allergies  ?Allergen Reactions  ? Myrbetriq [Mirabegron] Other (See Comments)  ?  Unknown  ? ? ?Patient Measurements: ?Height: 5\' 11"  (180.3 cm) ?Weight: 74.5 kg (164 lb 3.9 oz) ?IBW/kg (Calculated) : 75.3 ?Heparin Dosing Weight: 74.5 kg ? ?Vital Signs: ?Temp: 98.5 ?F (36.9 ?C) (04/02 0423) ?Temp Source: Oral (04/02 0423) ?BP: 135/80 (04/02 0423) ?Pulse Rate: 70 (04/02 0423) ? ?Labs: ?Recent Labs  ?  12/20/21 ?1004 12/21/21 ?02/20/22 12/21/21 ?1525 12/22/21 ?0110  ?HGB 12.9* 11.9*  --  11.8*  ?HCT 39.4 36.9*  --  35.6*  ?PLT 146* 146*  --  154  ?APTT 27  --   --   --   ?LABPROT 15.0  --   --   --   ?INR 1.2  --   --   --   ?HEPARINUNFRC  --  <0.10* 0.23*  --   ?CREATININE 1.24 1.09  --  1.07  ? ? ? ?Estimated Creatinine Clearance: 59 mL/min (by C-G formula based on SCr of 1.07 mg/dL). ? ? ?Medical History: ?Past Medical History:  ?Diagnosis Date  ? Aphagia   ? Arthritis   ? "knees" (09/14/2018)  ? Blind left eye   ? Chronic bronchitis (HCC)   ? Dementia (HCC)   ? Depression   ? GERD (gastroesophageal reflux disease)   ? Glaucoma   ? History of kidney stones   ? Hyperlipidemia   ? Hypertension   ? Pneumonia   ? "maybe twice" (09/14/2018)  ? Recurrent UTI (urinary tract infection)   ? Stroke [facility]) 11/2011  ? "didn't effect him at the time" (09/14/2018)  ? Type II diabetes mellitus (HCC)   ? ? ?Assessment: ?81 yo male admitted with hip fracture, now found with new PE.  Pharmacy asked to begin anticoagulation with IV heparin initially.No anticoagulants noted PTA. Patient was transitioned to therapeutic lovenox dosing on 4/1. Pharmacy has been consulted to transition to apixaban for PE treatment. Patient did not have a therapeutic HL and was on lovenox for <24 hours. Current hgb and plt stable. No sign of bleed.  ? ?Goal of Therapy:  ?Heparin level 0.3-0.7 units/ml ?Monitor platelets by anticoagulation  protocol: Yes ?  ?Plan:  ?Discontinue lovenox  ?Start apixaban 10 mg BID x7 days followed by 5 mg BID  ?Monitor CBC while inpatient ?Monitor s/sx of bleed  ? ?6/1, PharmD, MBA ?Pharmacy Resident ?[phone] ?12/22/2021 12:24 PM ? ? ? ? ? ? ? ?

## 2021-12-22 NOTE — Progress Notes (Signed)
?Progress Note ? ?Patient: Daniel Morrow [id number] DOB: 06/10/1941  ?DOA: 12/20/2021  DOS: 12/22/2021  ?  ?Brief hospital course: ?Daniel Morrow is a 81 y.o. male with a history of dementia who is nonverbal and bed bound at baseline, hx CVA, T2DM, HTN, HLD, chronic bronchitis who fell at [facility] 3/29, found to have a right hip fracture by XR and ultimately sent to the ED 3/31. He had been given ceftriaxone for positive UA at SNF. Here he was afebrile with normal WBC. He was hypoxic and found to have a pulmonary embolism. Due to concern for aspiration pneumonia risk, antibiotics for pneumonia were initiated, though ultimately discontinued. IV heparin has been given. Orthopedics recommended nonoperative management as long as pain can be controlled medically. Would consider palliative fixation if not. ? ?Assessment and Plan: ?* Fracture of femoral neck, right (HCC) ?s/p mechanical fall.  ?- Ortho consulted with plans for nonsurgical management in setting of advanced dementia. If has pain with transfer would consider operation for palliative means. PT will evaluate today, though he's been rolled/manipulated in the bed without groaning/grunting. ?- Pain control with nonsedating meds if able. No need for opioids at this time. ? ?Acute respiratory failure with hypoxia ([place]) ?Presenting to ED with oxygen of 85% on RA, no signs of distress. History of oxygen requirement in 2022, but per family was weaned from oxygen. Suspect due to PE, atelectasis (bed bound with bibasilar opacities). Pneumonia ruled out as he has no infection-specific signs or symptoms and PCT is undetectable. ?- Attempt IS if mentation would allow it. Has needed oxygen in the past, so may discharge with it, though will attempt to wean today.  ? ?At high risk for aspiration ?Currently on honey thick liquids/pureed diet and needs to be fed/supervised. Hold po if lethargic. ?- SLP evaluation appreciated. Follow recommendations. ? ?Frontotemporal dementia  ([place]) ?He is nonverbal, nonambulatory. Currently somnolent after seroquel given.  ?- Continue namenda, delirium precautions. Family at bedside as much as is reasonable. ? ?Type II diabetes mellitus ([place]) ?HbA1c 6.9%.  ?- Continue reduced dose basal insulin and SSI. ? ?Anxiety and depression ?- Continue zoloft, trazodone.  ?- Holding seroquel due to significant somnolence after dosing 4/1. Has returned to baseline per spouse. ? ?Hypertension ?Well controlled, continue lisinopril  ? ?BPH (benign prostatic hyperplasia) ?- Having good UOP.  ?- Continue flomax (formulary sub for rapaflo) ? ?Acute pulmonary embolism ([place]) ?- Convert parenteral anticoagulation to eliquis based on discussion with spouse.  ?- Echo with normal PASP. Mild RV dilatation noted, was present on last echo in 2017. Doubt from this and in setting of hemodynamic stability that this represents strain. ? ?Subjective: Pt has been sleeping most of the day, when seen this morning wife was at bedside. No bleeding reported. Pt nonverbal, grunts but is otherwise minimally responsive. ? ?Objective: ?Vitals:  ? 12/21/21 1430 12/21/21 1555 12/21/21 2036 12/22/21 0423  ?BP: (!) 110/50 (!) 109/55 (!) 145/69 135/80  ?Pulse:  74 85 70  ?Resp:  18 20 18   ?Temp:  97.9 ?F (36.6 ?C) 98.7 ?F (37.1 ?C) 98.5 ?F (36.9 ?C)  ?TempSrc:  Oral Oral Oral  ?SpO2:  95% 95% 97%  ?Weight:      ?Height:      ? ?Gen: Elderly, frail male in no distress ?Pulm: Nonlabored breathing 2L O2. Clear, laying back with mouth open ?CV: Regular rate and rhythm. No murmur, rub, or gallop. No JVD, no dependent edema. ?GI: Abdomen soft, non-tender, non-distended, with normoactive bowel sounds.  ?  Ext: Warm, slight increase in LE tone, legs crossed without deformities ?Skin: No rashes, lesions or ulcers on visualized skin. ?Neuro: Not oriented or cooperative with exam. Left pupil is mildly irregular, otherwise reactive bilaterally. ?Psych: Judgement and insight appear impaired   ? ?Data Personally  reviewed: ? ?CBC: ?Recent Labs  ?Lab 12/20/21 ?1004 12/21/21 ?[id number] 12/22/21 ?0110  ?WBC 9.1 7.1 6.4  ?NEUTROABS 6.9  --   --   ?HGB 12.9* 11.9* 11.8*  ?HCT 39.4 36.9* 35.6*  ?MCV 93.4 92.0 92.2  ?PLT 146* 146* 154  ? ?Basic Metabolic Panel: ?Recent Labs  ?Lab 12/20/21 ?1004 12/21/21 ?[id number] 12/22/21 ?0110  ?NA 140 141 140  ?K 3.9 4.1 4.0  ?CL 105 105 106  ?CO2 28 29 28   ?GLUCOSE 149* 182* 193*  ?BUN 25* 18 23  ?CREATININE 1.24 1.09 1.07  ?CALCIUM 8.6* 8.4* 8.4*  ? ?GFR: ?Estimated Creatinine Clearance: 59 mL/min (by C-G formula based on SCr of 1.07 mg/dL). ?Liver Function Tests: ?Recent Labs  ?Lab 12/20/21 ?1004 12/22/21 ?0110  ?AST 11* 10*  ?ALT 13 12  ?ALKPHOS 63 52  ?BILITOT 1.2 0.3  ?PROT 5.8* 5.5*  ?ALBUMIN 3.2* 2.7*  ? ?No results for input(s): LIPASE, AMYLASE in the last 168 hours. ?No results for input(s): AMMONIA in the last 168 hours. ?Coagulation Profile: ?Recent Labs  ?Lab 12/20/21 ?1004  ?INR 1.2  ? ?Cardiac Enzymes: ?No results for input(s): CKTOTAL, CKMB, CKMBINDEX, TROPONINI in the last 168 hours. ?BNP (last 3 results) ?No results for input(s): PROBNP in the last 8760 hours. ?HbA1C: ?Recent Labs  ?  12/21/21 ?[id number]  ?HGBA1C 6.9*  ? ?CBG: ?Recent Labs  ?Lab 12/21/21 ?1701 12/21/21 ?2038 12/22/21 ?[id number] 12/22/21 ?[id number] 12/22/21 ?1135  ?GLUCAP 148* 247* 182* 189* 302*  ? ?Lipid Profile: ?No results for input(s): CHOL, HDL, LDLCALC, TRIG, CHOLHDL, LDLDIRECT in the last 72 hours. ?Thyroid Function Tests: ?No results for input(s): TSH, T4TOTAL, FREET4, T3FREE, THYROIDAB in the last 72 hours. ?Anemia Panel: ?No results for input(s): VITAMINB12, FOLATE, FERRITIN, TIBC, IRON, RETICCTPCT in the last 72 hours. ?Urine analysis: ?   ?Component Value Date/Time  ? COLORURINE YELLOW 12/20/2021 1414  ? APPEARANCEUR CLEAR 12/20/2021 1414  ? LABSPEC 1.025 12/20/2021 1414  ? PHURINE 5.0 12/20/2021 1414  ? GLUCOSEU 50 (A) 12/20/2021 1414  ? HGBUR NEGATIVE 12/20/2021 1414  ? BILIRUBINUR NEGATIVE 12/20/2021 1414  ? KETONESUR  NEGATIVE 12/20/2021 1414  ? PROTEINUR NEGATIVE 12/20/2021 1414  ? UROBILINOGEN 0.2 12/02/2013 0611  ? NITRITE NEGATIVE 12/20/2021 1414  ? LEUKOCYTESUR SMALL (A) 12/20/2021 1414  ? ?Recent Results (from the past 240 hour(s))  ?Blood Culture (routine x 2)     Status: None (Preliminary result)  ? Collection Time: 12/20/21 10:04 AM  ? Specimen: BLOOD RIGHT FOREARM  ?Result Value Ref Range Status  ? Specimen Description BLOOD RIGHT FOREARM  Final  ? Special Requests   Final  ?  BOTTLES DRAWN AEROBIC AND ANAEROBIC Blood Culture adequate volume  ? Culture   Final  ?  NO GROWTH 2 DAYS ?Performed at [facility], [employer] [address] [place], [place] [address] ?  ? Report Status PENDING  Incomplete  ?Blood Culture (routine x 2)     Status: None (Preliminary result)  ? Collection Time: 12/20/21 10:06 AM  ? Specimen: BLOOD  ?Result Value Ref Range Status  ? Specimen Description BLOOD LEFT ANTECUBITAL  Final  ? Special Requests   Final  ?  BOTTLES DRAWN AEROBIC AND ANAEROBIC Blood Culture adequate volume  ?  Culture   Final  ?  NO GROWTH 2 DAYS ?Performed at [facility], [place] [address], [place], [place] [address] ?  ? Report Status PENDING  Incomplete  ?Urine Culture     Status: None  ? Collection Time: 12/20/21 12:08 PM  ? Specimen: In/Out Cath Urine  ?Result Value Ref Range Status  ? Specimen Description IN/OUT CATH URINE  Final  ? Special Requests NONE  Final  ? Culture   Final  ?  NO GROWTH ?Performed at [facility], [place] [address], [place], [place] [address] ?  ? Report Status 12/21/2021 FINAL  Final  ?   ?CT Head Wo Contrast ? ?Result Date: 12/20/2021 ?CLINICAL DATA:  Trauma EXAM: CT HEAD WITHOUT CONTRAST TECHNIQUE: Contiguous axial images were obtained from the base of the skull through the vertex without intravenous contrast. RADIATION DOSE REDUCTION: This exam was performed according to the departmental dose-optimization program which includes automated exposure control, adjustment of the mA and/or kV  according to patient size and/or use of iterative reconstruction technique. COMPARISON:  02/01/2021 FINDINGS: Brain: No acute intracranial findings are seen. Cortical sulci are prominent. There is decreased densi

## 2021-12-23 DIAGNOSIS — F419 Anxiety disorder, unspecified: Secondary | ICD-10-CM | POA: Diagnosis not present

## 2021-12-23 DIAGNOSIS — J9601 Acute respiratory failure with hypoxia: Secondary | ICD-10-CM | POA: Diagnosis not present

## 2021-12-23 DIAGNOSIS — S72001A Fracture of unspecified part of neck of right femur, initial encounter for closed fracture: Secondary | ICD-10-CM | POA: Diagnosis not present

## 2021-12-23 DIAGNOSIS — Z7401 Bed confinement status: Secondary | ICD-10-CM | POA: Diagnosis not present

## 2021-12-23 DIAGNOSIS — R4182 Altered mental status, unspecified: Secondary | ICD-10-CM | POA: Diagnosis not present

## 2021-12-23 DIAGNOSIS — I2699 Other pulmonary embolism without acute cor pulmonale: Secondary | ICD-10-CM | POA: Diagnosis not present

## 2021-12-23 LAB — GLUCOSE, CAPILLARY
Glucose-Capillary: 186 mg/dL — ABNORMAL HIGH (ref 70–99)
Glucose-Capillary: 254 mg/dL — ABNORMAL HIGH (ref 70–99)

## 2021-12-23 MED ORDER — TRAMADOL HCL 50 MG PO TABS: 50.0000 mg | ORAL_TABLET | Freq: Two times a day (BID) | ORAL | 0 refills | Status: AC | PRN

## 2021-12-23 MED ORDER — APIXABAN 5 MG PO TABS: ORAL_TABLET | ORAL | 0 refills | Status: AC

## 2021-12-23 NOTE — Discharge Instructions (Signed)
Information on my medicine - ELIQUIS? (apixaban) ? ?Why was Eliquis? prescribed for you? ?Eliquis? was prescribed to treat blood clots that may have been found in the veins of your legs (deep vein thrombosis) or in your lungs (pulmonary embolism) and to reduce the risk of them occurring again. ? ?What do You need to know about Eliquis? ? ?The starting dose is 10 mg (two 5 mg tablets) taken TWICE daily for the FIRST SEVEN (7) DAYS, then on (enter date)  12/29/21  the dose is reduced to ONE 5 mg tablet taken TWICE daily.  Eliquis? may be taken with or without food.  ? ?Try to take the dose about the same time in the morning and in the evening. If you have difficulty swallowing the tablet whole please discuss with your pharmacist how to take the medication safely. ? ?Take Eliquis? exactly as prescribed and DO NOT stop taking Eliquis? without talking to the doctor who prescribed the medication.  Stopping may increase your risk of developing a new blood clot.  Refill your prescription before you run out. ? ?After discharge, you should have regular check-up appointments with your healthcare provider that is prescribing your Eliquis?. ?   ?What do you do if you miss a dose? ?If a dose of ELIQUIS? is not taken at the scheduled time, take it as soon as possible on the same day and twice-daily administration should be resumed. The dose should not be doubled to make up for a missed dose. ? ?Important Safety Information ?A possible side effect of Eliquis? is bleeding. You should call your healthcare provider right away if you experience any of the following: ?Bleeding from an injury or your nose that does not stop. ?Unusual colored urine (red or dark brown) or unusual colored stools (red or black). ?Unusual bruising for unknown reasons. ?A serious fall or if you hit your head (even if there is no bleeding). ? ?Some medicines may interact with Eliquis? and might increase your risk of bleeding or clotting while on Eliquis?. To help  avoid this, consult your healthcare provider or pharmacist prior to using any new prescription or non-prescription medications, including herbals, vitamins, non-steroidal anti-inflammatory drugs (NSAIDs) and supplements. ? ?This website has more information on Eliquis? (apixaban): http://www.eliquis.com/eliquis/home ? ?

## 2021-12-23 NOTE — Progress Notes (Signed)
Report given to [employer]. ?

## 2021-12-23 NOTE — Discharge Summary (Signed)
?Physician Discharge Summary ?  ?Patient: Daniel Morrow MRN: [id number] DOB: [birth year]  ?Admit date:     12/20/2021  ?Discharge date: 12/23/21  ?Discharge Physician: Patrecia Pour  ? ?PCP: No primary care provider on file.  ? ?Recommendations at discharge:  ?Continue routine care including eliquis started for PE. Consider SpO2 monitoring, wean oxygen as able. ?Consider orthopedics follow up if pain appears to be uncontrolled. ? ?Discharge Diagnoses: ?Principal Problem: ?  Fracture of femoral neck, right ([place]) ?Active Problems: ?  Acute respiratory failure with hypoxia ([place]) ?  At high risk for aspiration ?  Frontotemporal dementia ([place]) ?  Type II diabetes mellitus ([place]) ?  Anxiety and depression ?  Hypertension ?  BPH (benign prostatic hyperplasia) ?  Acute pulmonary embolism ([place]) ? ?Resolved Problems: ?  Hyperlipidemia ? ?Hospital Course: ?Daniel Morrow is a 81 y.o. male with a history of dementia who is nonverbal and bed bound at baseline, hx CVA, T2DM, HTN, HLD, chronic bronchitis who fell at [facility] 3/29, found to have a right hip fracture by XR and ultimately sent to the ED 3/31. He had been given ceftriaxone for positive UA at SNF. Here he was afebrile with normal WBC. He was hypoxic and found to have a pulmonary embolism. Due to concern for aspiration pneumonia risk, antibiotics for pneumonia were initiated, though ultimately discontinued with low concern for infectious etiology. IV heparin was started and ultimately transitioned to eliquis. Orthopedics recommended nonoperative management as long as pain can be controlled medically. This had been the case. In fact, the patient's home dosing of tramadol has been continued with no prn doses being required. This PTA med will be continued at discharge.. The patient's pain has been minimally expressed. ? ?Assessment and Plan: ?* Fracture of femoral neck, right ([facility]) ?s/p mechanical fall.  ?- Ortho consulted with plans for nonsurgical management in setting of  advanced dementia. Pain control without palliative fixation is adequate and pt is bedbound regardless. Has not required opioids. ? ?Acute respiratory failure with hypoxia ([place]) ?Presenting to ED with oxygen of 85% on RA, no signs of distress. History of oxygen requirement in 2022, but per family was weaned from oxygen. Suspect due to PE, atelectasis (bed bound with bibasilar opacities). Pneumonia ruled out as he has no infection-specific signs or symptoms and PCT is undetectable. ?- Attempt IS if mentation would allow it. Has needed oxygen in the past, so may discharge with it if desaturating. ? ?At high risk for aspiration ?Currently on honey thick liquids/pureed diet and needs to be fed/supervised.  ? ?Frontotemporal dementia ([place]) ?He is nonverbal, nonambulatory. No changes. ? ?Type II diabetes mellitus ([place]) ?HbA1c 6.9%.  ?- Continue home glargine ? ?Anxiety and depression ?- Plan to continue home meds ? ?Hypertension ?Well controlled, continue lisinopril  ? ?BPH (benign prostatic hyperplasia) ?- Having good UOP. Continue rapaflo ? ?Acute pulmonary embolism ([place]) ?- Converted parenteral anticoagulation to eliquis based on discussion with spouse.  ?- Echo with normal PASP. Mild RV dilatation noted, was present on last echo in 2017. Doubt from this and in setting of hemodynamic stability that this represents strain. ? ?Consultants: Orthopedics ?Procedures performed: None  ?Disposition: Skilled nursing facility ?Diet recommendation:  ?Dysphagia type 1 Honey thick Liquid ?DISCHARGE MEDICATION: ?Allergies as of 12/23/2021   ? ?   Reactions  ? Myrbetriq [mirabegron] Other (See Comments)  ? Unknown  ? ?  ? ?  ?Medication List  ?  ? ?TAKE these medications   ? ?acetaminophen 325  MG tablet ?Commonly known as: TYLENOL ?Take 650 mg by mouth every 4 (four) hours as needed for mild pain or fever. ?  ?apixaban 5 MG Tabs tablet ?Commonly known as: ELIQUIS ?take 2 tablets (10mg ) by mouth twice daily for 7 days, then take 1 tablet  (5mg ) by mouth twice daily thereafter ?  ?Basaglar KwikPen 100 UNIT/ML ?Inject 15 Units into the skin in the morning. ?  ?brimonidine 0.2 % ophthalmic solution ?Commonly known as: ALPHAGAN ?Place 1 drop into the right eye 2 (two) times daily. ?  ?lansoprazole 30 MG capsule ?Commonly known as: PREVACID ?Take 30 mg by mouth daily. ?  ?latanoprost 0.005 % ophthalmic solution ?Commonly known as: XALATAN ?Place 1 drop into the right eye at bedtime. ?  ?lisinopril 5 MG tablet ?Commonly known as: ZESTRIL ?Take 5 mg by mouth daily. ?  ?memantine 10 MG tablet ?Commonly known as: NAMENDA ?Take 1 tablet (10 mg total) by mouth 2 (two) times daily. ?  ?protein supplement Liqd ?Take 30 mLs by mouth in the morning and at bedtime. ?  ?QUEtiapine 25 MG tablet ?Commonly known as: SEROQUEL ?Take 1 tablet (25 mg total) by mouth 3 (three) times daily. ?What changed:  ?how much to take ?when to take this ?  ?Resource ThickenUp Clear Powd ?Use as needed ?  ?sennosides-docusate sodium 8.6-50 MG tablet ?Commonly known as: SENOKOT-S ?Take 1 tablet by mouth in the morning and at bedtime. ?  ?sertraline 25 MG tablet ?Commonly known as: ZOLOFT ?Take 25 mg by mouth daily. Take along with 50mg  tablet for total of 75mg  daily ?  ?sertraline 50 MG tablet ?Commonly known as: ZOLOFT ?Take 50 mg by mouth daily. Take along with 25mg  tablet to total 75mg  daily ?  ?silodosin 8 MG Caps capsule ?Commonly known as: RAPAFLO ?Take 8 mg by mouth daily. ?  ?timolol 0.5 % ophthalmic solution ?Commonly known as: TIMOPTIC ?Place 1 drop into the right eye 2 (two) times daily. ?  ?traMADol 50 MG tablet ?Commonly known as: ULTRAM ?Take 1 tablet (50 mg total) by mouth every 12 (twelve) hours as needed for moderate pain. ?What changed: when to take this ?  ?traZODone 50 MG tablet ?Commonly known as: DESYREL ?Take 75 mg by mouth at bedtime. ?  ?vitamin B-12 1000 MCG tablet ?Commonly known as: CYANOCOBALAMIN ?Take 1,000 mcg by mouth daily. ?  ? ?  ? ? ?Discharge  Exam: ?Nonverbal, tracks, no distress ?RRR without MRG, no edema ?Clear, nonlabored ?No gross deformity ? ?Condition at discharge: stable ? ?The results of significant diagnostics from this hospitalization (including imaging, microbiology, ancillary and laboratory) are listed below for reference.  ? ?Imaging Studies: ?DG Chest 1 View ? ?Result Date: 12/20/2021 ?CLINICAL DATA:  Fall EXAM: CHEST  1 VIEW COMPARISON:  Radiograph 02/01/2021 FINDINGS: Unchanged enlarged cardiac silhouette. There are bibasilar opacities, left greater than right. Low lung volumes with interstitial prominence. Probable trace right pleural effusion. No visible pneumothorax. No acute osseous abnormality. Thoracic spondylosis. IMPRESSION: Low lung volumes with bibasilar opacities which could be atelectasis or developing infection. Probable trace right pleural effusion. Electronically Signed   By: Maurine Simmering M.D.   On: 12/20/2021 10:34  ? ?DG Pelvis 1-2 Views ? ?Result Date: 12/20/2021 ?CLINICAL DATA:  Fall, hip fracture EXAM: PELVIS - 1-2 VIEW COMPARISON:  CT abdomen/pelvis 11/14/2013 FINDINGS: There is an impacted fracture of the right femoral neck with medial angulation of the distal femur. Femoroacetabular alignment is maintained. There is no fracture on the left. The SI  joints and symphysis pubis appear intact. IMPRESSION: Impacted fracture of the right femoral neck. Electronically Signed   By: Valetta Mole M.D.   On: 12/20/2021 10:35  ? ?CT Head Wo Contrast ? ?Result Date: 12/20/2021 ?CLINICAL DATA:  Trauma EXAM: CT HEAD WITHOUT CONTRAST TECHNIQUE: Contiguous axial images were obtained from the base of the skull through the vertex without intravenous contrast. RADIATION DOSE REDUCTION: This exam was performed according to the departmental dose-optimization program which includes automated exposure control, adjustment of the mA and/or kV according to patient size and/or use of iterative reconstruction technique. COMPARISON:  02/01/2021  FINDINGS: Brain: No acute intracranial findings are seen. Cortical sulci are prominent. There is decreased density in the periventricular and subcortical white matter. There is subtle decreased density in the right cer

## 2021-12-23 NOTE — TOC Transition Note (Signed)
Transition of Care (TOC) - CM/SW Discharge Note ? ? ?Patient Details  ?Name: Daniel Morrow ?MRN: [id number] ?Date of Birth: Apr 14, 1941 ? ?Transition of Care (TOC) CM/SW Contact:  ?[name], LCSWA ?Phone Number: ?12/23/2021, 2:25 PM ? ? ?Clinical Narrative:    ?Pt to be transported to [facility] via [facility]. ?Nurse to call report to [phone]. ? ? ? ?Final next level of care: Skilled Nursing Facility ?Barriers to Discharge: Barriers Resolved ? ? ?Patient Goals and CMS Choice ?  ?  ?  ? ?Discharge Placement ?  ?           ?Patient chooses bed at: [facility] [place] ?Patient to be transferred to facility by: [facility] ?Name of family member notified: [name] ?Patient and family notified of of transfer: 12/23/21 ? ?Discharge Plan and Services ?  ?  ?           ?  ?  ?  ?  ?  ?  ?  ?  ?  ?  ? ?Social Determinants of Health (SDOH) Interventions ?  ? ? ?Readmission Risk Interventions ?   ? View : No data to display.  ?  ?  ?  ? ? ? ? ? ?

## 2021-12-23 NOTE — Care Management Important Message (Signed)
Important Message ? ?Patient Details  ?Name: Daniel Morrow ?MRN: [id number] ?Date of Birth: [birth year] ? ? ?Medicare Important Message Given:  Yes ? ? ? ? ?Marylene Land  Myldred Raju-Martin ?12/23/2021, 3:41 PM ?

## 2021-12-24 LAB — LEGIONELLA PNEUMOPHILA SEROGP 1 UR AG: L. pneumophila Serogp 1 Ur Ag: NEGATIVE

## 2021-12-25 DIAGNOSIS — S72001A Fracture of unspecified part of neck of right femur, initial encounter for closed fracture: Secondary | ICD-10-CM | POA: Diagnosis not present

## 2021-12-25 DIAGNOSIS — G3109 Other frontotemporal dementia: Secondary | ICD-10-CM | POA: Diagnosis not present

## 2021-12-25 DIAGNOSIS — I10 Essential (primary) hypertension: Secondary | ICD-10-CM | POA: Diagnosis not present

## 2021-12-25 DIAGNOSIS — J9601 Acute respiratory failure with hypoxia: Secondary | ICD-10-CM | POA: Diagnosis not present

## 2021-12-25 DIAGNOSIS — N400 Enlarged prostate without lower urinary tract symptoms: Secondary | ICD-10-CM | POA: Diagnosis not present

## 2021-12-25 DIAGNOSIS — K219 Gastro-esophageal reflux disease without esophagitis: Secondary | ICD-10-CM | POA: Diagnosis not present

## 2021-12-25 DIAGNOSIS — E1159 Type 2 diabetes mellitus with other circulatory complications: Secondary | ICD-10-CM | POA: Diagnosis not present

## 2021-12-25 DIAGNOSIS — R69 Illness, unspecified: Secondary | ICD-10-CM | POA: Diagnosis not present

## 2021-12-25 DIAGNOSIS — R1310 Dysphagia, unspecified: Secondary | ICD-10-CM | POA: Diagnosis not present

## 2021-12-25 LAB — CULTURE, BLOOD (ROUTINE X 2)
Culture: NO GROWTH
Culture: NO GROWTH
Special Requests: ADEQUATE
Special Requests: ADEQUATE

## 2021-12-26 DIAGNOSIS — J9601 Acute respiratory failure with hypoxia: Secondary | ICD-10-CM | POA: Diagnosis not present

## 2021-12-26 DIAGNOSIS — S72001A Fracture of unspecified part of neck of right femur, initial encounter for closed fracture: Secondary | ICD-10-CM | POA: Diagnosis not present

## 2021-12-26 DIAGNOSIS — G3109 Other frontotemporal dementia: Secondary | ICD-10-CM | POA: Diagnosis not present

## 2021-12-26 DIAGNOSIS — R69 Illness, unspecified: Secondary | ICD-10-CM | POA: Diagnosis not present

## 2021-12-31 DIAGNOSIS — F411 Generalized anxiety disorder: Secondary | ICD-10-CM | POA: Diagnosis not present

## 2021-12-31 DIAGNOSIS — F03918 Unspecified dementia, unspecified severity, with other behavioral disturbance: Secondary | ICD-10-CM | POA: Diagnosis not present

## 2021-12-31 DIAGNOSIS — F5101 Primary insomnia: Secondary | ICD-10-CM | POA: Diagnosis not present

## 2021-12-31 DIAGNOSIS — R69 Illness, unspecified: Secondary | ICD-10-CM | POA: Diagnosis not present

## 2022-01-14 DIAGNOSIS — R69 Illness, unspecified: Secondary | ICD-10-CM | POA: Diagnosis not present

## 2022-01-14 DIAGNOSIS — F03918 Unspecified dementia, unspecified severity, with other behavioral disturbance: Secondary | ICD-10-CM | POA: Diagnosis not present

## 2022-01-14 DIAGNOSIS — F5101 Primary insomnia: Secondary | ICD-10-CM | POA: Diagnosis not present

## 2022-01-14 DIAGNOSIS — F411 Generalized anxiety disorder: Secondary | ICD-10-CM | POA: Diagnosis not present

## 2022-01-21 DIAGNOSIS — R531 Weakness: Secondary | ICD-10-CM | POA: Diagnosis not present

## 2022-01-21 DIAGNOSIS — E785 Hyperlipidemia, unspecified: Secondary | ICD-10-CM | POA: Diagnosis not present

## 2022-01-21 DIAGNOSIS — E11649 Type 2 diabetes mellitus with hypoglycemia without coma: Secondary | ICD-10-CM | POA: Diagnosis not present

## 2022-01-21 DIAGNOSIS — E0865 Diabetes mellitus due to underlying condition with hyperglycemia: Secondary | ICD-10-CM | POA: Diagnosis not present

## 2022-01-21 DIAGNOSIS — I10 Essential (primary) hypertension: Secondary | ICD-10-CM | POA: Diagnosis not present

## 2022-01-21 DIAGNOSIS — D649 Anemia, unspecified: Secondary | ICD-10-CM | POA: Diagnosis not present

## 2022-01-21 DIAGNOSIS — E1165 Type 2 diabetes mellitus with hyperglycemia: Secondary | ICD-10-CM | POA: Diagnosis not present

## 2022-01-22 DIAGNOSIS — D649 Anemia, unspecified: Secondary | ICD-10-CM | POA: Diagnosis not present

## 2022-01-22 DIAGNOSIS — E1165 Type 2 diabetes mellitus with hyperglycemia: Secondary | ICD-10-CM | POA: Diagnosis not present

## 2022-01-22 DIAGNOSIS — R531 Weakness: Secondary | ICD-10-CM | POA: Diagnosis not present

## 2022-01-22 DIAGNOSIS — E785 Hyperlipidemia, unspecified: Secondary | ICD-10-CM | POA: Diagnosis not present

## 2022-01-22 DIAGNOSIS — E0865 Diabetes mellitus due to underlying condition with hyperglycemia: Secondary | ICD-10-CM | POA: Diagnosis not present

## 2022-01-22 DIAGNOSIS — E11649 Type 2 diabetes mellitus with hypoglycemia without coma: Secondary | ICD-10-CM | POA: Diagnosis not present

## 2022-01-22 DIAGNOSIS — I10 Essential (primary) hypertension: Secondary | ICD-10-CM | POA: Diagnosis not present

## 2022-01-24 DIAGNOSIS — E860 Dehydration: Secondary | ICD-10-CM | POA: Diagnosis not present

## 2022-01-28 DIAGNOSIS — R69 Illness, unspecified: Secondary | ICD-10-CM | POA: Diagnosis not present

## 2022-01-28 DIAGNOSIS — F5101 Primary insomnia: Secondary | ICD-10-CM | POA: Diagnosis not present

## 2022-01-28 DIAGNOSIS — F02C3 Dementia in other diseases classified elsewhere, severe, with mood disturbance: Secondary | ICD-10-CM | POA: Diagnosis not present

## 2022-02-20 DEATH — deceased
